# Patient Record
Sex: Female | Born: 1940 | Race: White | Hispanic: No | Smoking: Former smoker
Health system: Southern US, Community
[De-identification: ages and names within clinical notes are randomized; demographics above are authoritative.]

## PROBLEM LIST (undated history)

## (undated) DIAGNOSIS — G20 Parkinson's disease: Secondary | ICD-10-CM

## (undated) DIAGNOSIS — G4700 Insomnia, unspecified: Secondary | ICD-10-CM

## (undated) DIAGNOSIS — I4891 Unspecified atrial fibrillation: Secondary | ICD-10-CM

## (undated) DIAGNOSIS — I509 Heart failure, unspecified: Secondary | ICD-10-CM

## (undated) DIAGNOSIS — F329 Major depressive disorder, single episode, unspecified: Secondary | ICD-10-CM

## (undated) DIAGNOSIS — J869 Pyothorax without fistula: Secondary | ICD-10-CM

## (undated) DIAGNOSIS — K219 Gastro-esophageal reflux disease without esophagitis: Secondary | ICD-10-CM

## (undated) DIAGNOSIS — D649 Anemia, unspecified: Secondary | ICD-10-CM

## (undated) DIAGNOSIS — G20A1 Parkinson's disease without dyskinesia, without mention of fluctuations: Secondary | ICD-10-CM

## (undated) DIAGNOSIS — M1990 Unspecified osteoarthritis, unspecified site: Secondary | ICD-10-CM

## (undated) DIAGNOSIS — I10 Essential (primary) hypertension: Secondary | ICD-10-CM

## (undated) HISTORY — PX: PEG TUBE PLACEMENT: SUR1034

---

## 2015-03-22 ENCOUNTER — Ambulatory Visit
Admission: EM | Admit: 2015-03-22 | Discharge: 2015-03-22 | Disposition: A | Discharge status: Home / Self Care | Payer: Medicare PPO | Attending: Family Medicine | Admitting: Family Medicine

## 2015-03-22 DIAGNOSIS — R3915 Urgency of urination: Secondary | ICD-10-CM

## 2015-03-22 DIAGNOSIS — R350 Frequency of micturition: Secondary | ICD-10-CM

## 2015-03-22 DIAGNOSIS — I10 Essential (primary) hypertension: Secondary | ICD-10-CM | POA: Insufficient documentation

## 2015-03-22 HISTORY — DX: Essential (primary) hypertension: I10

## 2015-03-22 LAB — URINALYSIS COMPLETE WITH MICROSCOPIC (ARMC ONLY)
GLUCOSE, UA: NEGATIVE mg/dL
HGB URINE DIPSTICK: NEGATIVE
NITRITE: NEGATIVE
Protein, ur: 100 mg/dL — AB
RBC / HPF: NONE SEEN RBC/hpf (ref <3)
Specific Gravity, Urine: 1.025 (ref 1.005–1.030)
pH: 5.5 (ref 5.0–8.0)

## 2015-03-22 MED ORDER — CIPROFLOXACIN HCL 250 MG PO TABS: 250.0000 mg | ORAL_TABLET | Freq: Two times a day (BID) | ORAL | Status: DC

## 2015-03-22 NOTE — ED Provider Notes (Signed)
CSN: [id number]     Arrival date & time 03/22/15  1330 History   First MD Initiated Contact with Patient 03/22/15 1606     Chief Complaint  Patient presents with  . Urinary Frequency   (Consider location/radiation/quality/duration/timing/severity/associated sxs/prior Treatment) Patient is a 74 y.o. female presenting with frequency.  Urinary Frequency This is a new problem. The current episode started more than 2 days ago. The problem occurs constantly. The problem has not changed since onset.Pertinent negatives include no abdominal pain. Associated symptoms comments: Denies fevers, chills, vomiting, or back pain. Nothing relieves the symptoms. She has tried nothing for the symptoms.    Past Medical History  Diagnosis Date  . Hypertension    History reviewed. No pertinent past surgical history. No family history on file. History  Substance Use Topics  . Smoking status: Never Smoker   . Smokeless tobacco: Not on file  . Alcohol Use: No   OB History    No data available     Review of Systems  Gastrointestinal: Negative for abdominal pain.  Genitourinary: Positive for frequency.    Allergies  Sulfa antibiotics  Home Medications   Prior to Admission medications   Medication Sig Start Date End Date Taking? Authorizing Provider  amLODipine (NORVASC) 10 MG tablet Take 10 mg by mouth daily.   Yes Historical Provider, MD  aspirin 81 MG tablet Take 81 mg by mouth daily.   Yes Historical Provider, MD  Multiple Vitamin (MULTIVITAMIN) capsule Take 1 capsule by mouth daily.   Yes Historical Provider, MD  nebivolol (BYSTOLIC) 2.5 MG tablet Take 2.5 mg by mouth daily.   Yes Historical Provider, MD  PARoxetine (PAXIL) 20 MG tablet Take 20 mg by mouth daily.   Yes Historical Provider, MD  triamterene-hydrochlorothiazide (DYAZIDE) 37.5-25 MG per capsule Take 1 capsule by mouth daily.   Yes Historical Provider, MD  ciprofloxacin (CIPRO) 250 MG tablet Take 1 tablet (250 mg total) by mouth  every 12 (twelve) hours. 03/22/15   Payton Mccallumrlando Kyliegh Jester, MD   BP 170/82 mmHg  Pulse 84  Temp(Src) 97.5 F (36.4 C) (Tympanic)  Resp 18  Ht 5\' 2"  (1.575 m)  Wt 260 lb (117.935 kg)  BMI 47.54 kg/m2  SpO2 99%  LMP  Physical Exam  Constitutional: She appears well-developed and well-nourished. No distress.  Pulmonary/Chest: Effort normal. No respiratory distress.  Abdominal: Soft. She exhibits no distension. There is no tenderness. There is no rebound and no guarding.  Skin: She is not diaphoretic.  Nursing note and vitals reviewed.   ED Course  Procedures (including critical care time) Labs Review Labs Reviewed  URINALYSIS COMPLETEWITH MICROSCOPIC ([facility] ONLY) - Abnormal; Notable for the following:    Bilirubin Urine 1+ (*)    Ketones, ur TRACE (*)    Protein, ur 100 (*)    Leukocytes, UA TRACE (*)    Squamous Epithelial / LPF 6-30 (*)    All other components within normal limits  URINE CULTURE    Imaging Review No results found.   MDM   1. Urinary urgency   2. Urinary frequency    New Prescriptions   CIPROFLOXACIN (CIPRO) 250 MG TABLET    Take 1 tablet (250 mg total) by mouth every 12 (twelve) hours.   Plan: 1. Test results and diagnosis reviewed with patient 2. rx as per orders; risks, benefits, potential side effects reviewed with patient 3. Recommend supportive treatment with increased fluids 4. F/u prn if symptoms worsen or don't improve   [employer]rlando Kaydence Menard,  MD 03/22/15 1610

## 2015-03-22 NOTE — ED Notes (Signed)
Pt states "I have urinary pain x 4-5 days. More than pain I feel I can't empty my bladder."

## 2015-03-24 LAB — URINE CULTURE

## 2015-11-07 ENCOUNTER — Other Ambulatory Visit: Payer: Self-pay | Admitting: Oncology

## 2016-07-06 DIAGNOSIS — M62449 Contracture of muscle, unspecified hand: Secondary | ICD-10-CM | POA: Insufficient documentation

## 2016-09-28 ENCOUNTER — Inpatient Hospital Stay
Admission: EM | Admit: 2016-09-28 | Discharge: 2016-10-02 | DRG: 871 | Disposition: A | Discharge status: Skilled Nursing Facility | Payer: Medicare Other | Attending: Internal Medicine | Admitting: Internal Medicine

## 2016-09-28 ENCOUNTER — Encounter: Payer: Self-pay | Admitting: Intensive Care

## 2016-09-28 ENCOUNTER — Emergency Department: Payer: Medicare Other

## 2016-09-28 DIAGNOSIS — Z931 Gastrostomy status: Secondary | ICD-10-CM | POA: Diagnosis not present

## 2016-09-28 DIAGNOSIS — J449 Chronic obstructive pulmonary disease, unspecified: Secondary | ICD-10-CM | POA: Diagnosis present

## 2016-09-28 DIAGNOSIS — I4891 Unspecified atrial fibrillation: Secondary | ICD-10-CM | POA: Diagnosis present

## 2016-09-28 DIAGNOSIS — A0472 Enterocolitis due to Clostridium difficile, not specified as recurrent: Secondary | ICD-10-CM | POA: Diagnosis present

## 2016-09-28 DIAGNOSIS — R197 Diarrhea, unspecified: Secondary | ICD-10-CM

## 2016-09-28 DIAGNOSIS — Z87891 Personal history of nicotine dependence: Secondary | ICD-10-CM

## 2016-09-28 DIAGNOSIS — Z7901 Long term (current) use of anticoagulants: Secondary | ICD-10-CM | POA: Diagnosis not present

## 2016-09-28 DIAGNOSIS — R6521 Severe sepsis with septic shock: Secondary | ICD-10-CM | POA: Diagnosis present

## 2016-09-28 DIAGNOSIS — Z7189 Other specified counseling: Secondary | ICD-10-CM | POA: Diagnosis not present

## 2016-09-28 DIAGNOSIS — E43 Unspecified severe protein-calorie malnutrition: Secondary | ICD-10-CM | POA: Diagnosis present

## 2016-09-28 DIAGNOSIS — I10 Essential (primary) hypertension: Secondary | ICD-10-CM | POA: Diagnosis present

## 2016-09-28 DIAGNOSIS — Z66 Do not resuscitate: Secondary | ICD-10-CM | POA: Diagnosis present

## 2016-09-28 DIAGNOSIS — R0902 Hypoxemia: Secondary | ICD-10-CM | POA: Diagnosis not present

## 2016-09-28 DIAGNOSIS — J9601 Acute respiratory failure with hypoxia: Secondary | ICD-10-CM | POA: Diagnosis present

## 2016-09-28 DIAGNOSIS — R32 Unspecified urinary incontinence: Secondary | ICD-10-CM | POA: Diagnosis present

## 2016-09-28 DIAGNOSIS — H9190 Unspecified hearing loss, unspecified ear: Secondary | ICD-10-CM | POA: Diagnosis present

## 2016-09-28 DIAGNOSIS — A419 Sepsis, unspecified organism: Principal | ICD-10-CM

## 2016-09-28 DIAGNOSIS — N170 Acute kidney failure with tubular necrosis: Secondary | ICD-10-CM | POA: Diagnosis present

## 2016-09-28 DIAGNOSIS — Z791 Long term (current) use of non-steroidal anti-inflammatories (NSAID): Secondary | ICD-10-CM | POA: Diagnosis not present

## 2016-09-28 DIAGNOSIS — E876 Hypokalemia: Secondary | ICD-10-CM | POA: Diagnosis present

## 2016-09-28 DIAGNOSIS — J869 Pyothorax without fistula: Secondary | ICD-10-CM | POA: Diagnosis present

## 2016-09-28 DIAGNOSIS — E871 Hypo-osmolality and hyponatremia: Secondary | ICD-10-CM | POA: Diagnosis present

## 2016-09-28 DIAGNOSIS — Z79899 Other long term (current) drug therapy: Secondary | ICD-10-CM

## 2016-09-28 DIAGNOSIS — L8990 Pressure ulcer of unspecified site, unspecified stage: Secondary | ICD-10-CM | POA: Insufficient documentation

## 2016-09-28 DIAGNOSIS — Z882 Allergy status to sulfonamides status: Secondary | ICD-10-CM

## 2016-09-28 DIAGNOSIS — I959 Hypotension, unspecified: Secondary | ICD-10-CM

## 2016-09-28 DIAGNOSIS — Z6839 Body mass index (BMI) 39.0-39.9, adult: Secondary | ICD-10-CM | POA: Diagnosis not present

## 2016-09-28 DIAGNOSIS — Z515 Encounter for palliative care: Secondary | ICD-10-CM

## 2016-09-28 HISTORY — DX: Unspecified atrial fibrillation: I48.91

## 2016-09-28 HISTORY — DX: Pyothorax without fistula: J86.9

## 2016-09-28 LAB — GASTROINTESTINAL PANEL BY PCR, STOOL (REPLACES STOOL CULTURE)

## 2016-09-28 LAB — CBC WITH DIFFERENTIAL/PLATELET
Basophils Absolute: 0 10*3/uL (ref 0–0.1)
Basophils Relative: 0 %
Eosinophils Absolute: 0.6 10*3/uL (ref 0–0.7)
Eosinophils Relative: 1 %
HEMATOCRIT: 31.1 % — AB (ref 35.0–47.0)
Hemoglobin: 9.8 g/dL — ABNORMAL LOW (ref 12.0–16.0)
Lymphocytes Relative: 1 %
Lymphs Abs: 0.6 10*3/uL — ABNORMAL LOW (ref 1.0–3.6)
MCH: 23.3 pg — ABNORMAL LOW (ref 26.0–34.0)
MCHC: 31.5 g/dL — ABNORMAL LOW (ref 32.0–36.0)
MCV: 73.9 fL — AB (ref 80.0–100.0)
MONOS PCT: 1 %
Monocytes Absolute: 0.6 10*3/uL (ref 0.2–0.9)
NEUTROS PCT: 97 %
Neutro Abs: 53.3 10*3/uL — ABNORMAL HIGH (ref 1.4–6.5)
PLATELETS: 358 10*3/uL (ref 150–440)
RBC: 4.21 MIL/uL (ref 3.80–5.20)
RDW: 21.1 % — ABNORMAL HIGH (ref 11.5–14.5)
WBC: 55.1 10*3/uL (ref 3.6–11.0)

## 2016-09-28 LAB — COMPREHENSIVE METABOLIC PANEL
ALT: 10 U/L — AB (ref 14–54)
ANION GAP: 11 (ref 5–15)
AST: 54 U/L — ABNORMAL HIGH (ref 15–41)
Albumin: 2.3 g/dL — ABNORMAL LOW (ref 3.5–5.0)
Alkaline Phosphatase: 137 U/L — ABNORMAL HIGH (ref 38–126)
BUN: 16 mg/dL (ref 6–20)
CHLORIDE: 95 mmol/L — AB (ref 101–111)
CO2: 25 mmol/L (ref 22–32)
Calcium: 8.5 mg/dL — ABNORMAL LOW (ref 8.9–10.3)
Creatinine, Ser: 1.95 mg/dL — ABNORMAL HIGH (ref 0.44–1.00)
GFR calc non Af Amer: 24 mL/min — ABNORMAL LOW (ref >60)
GFR, EST AFRICAN AMERICAN: 28 mL/min — AB (ref >60)
Glucose, Bld: 113 mg/dL — ABNORMAL HIGH (ref 65–99)
Potassium: 3.6 mmol/L (ref 3.5–5.1)
SODIUM: 131 mmol/L — AB (ref 135–145)
Total Bilirubin: 1.2 mg/dL (ref 0.3–1.2)
Total Protein: 6.5 g/dL (ref 6.5–8.1)

## 2016-09-28 LAB — BLOOD GAS, VENOUS
Acid-Base Excess: 0.8 mmol/L (ref 0.0–2.0)
BICARBONATE: 27.7 mmol/L (ref 20.0–28.0)
PATIENT TEMPERATURE: 37
pCO2, Ven: 55 mmHg (ref 44.0–60.0)
pH, Ven: 7.31 (ref 7.250–7.430)

## 2016-09-28 LAB — PROTIME-INR
INR: 1.71
PROTHROMBIN TIME: 20.3 s — AB (ref 11.4–15.2)

## 2016-09-28 LAB — C DIFFICILE QUICK SCREEN W PCR REFLEX
C Diff antigen: POSITIVE — AB
C Diff toxin: NEGATIVE

## 2016-09-28 LAB — TROPONIN I: Troponin I: <0.03 ng/mL (ref <0.03)

## 2016-09-28 LAB — CLOSTRIDIUM DIFFICILE BY PCR: Toxigenic C. Difficile by PCR: POSITIVE — AB

## 2016-09-28 LAB — LACTIC ACID, PLASMA
LACTIC ACID, VENOUS: 1.7 mmol/L (ref 0.5–1.9)
Lactic Acid, Venous: 1.9 mmol/L (ref 0.5–1.9)

## 2016-09-28 LAB — MRSA PCR SCREENING: MRSA BY PCR: NEGATIVE

## 2016-09-28 MED ORDER — SALINE SPRAY 0.65 % NA SOLN
1.0000 | NASAL | Status: DC | PRN
  Filled 2016-09-28: qty 44

## 2016-09-28 MED ORDER — SODIUM CHLORIDE 0.9 % IV SOLN
Freq: Once | INTRAVENOUS | Status: AC
  Administered 2016-09-28: 11:00:00 via INTRAVENOUS

## 2016-09-28 MED ORDER — VANCOMYCIN HCL IN DEXTROSE 1-5 GM/200ML-% IV SOLN
1000.0000 mg | Freq: Once | INTRAVENOUS | Status: AC
  Administered 2016-09-28: 1000 mg via INTRAVENOUS
  Filled 2016-09-28: qty 200

## 2016-09-28 MED ORDER — SODIUM CHLORIDE 0.9 % IV SOLN
Freq: Once | INTRAVENOUS | Status: AC
  Administered 2016-09-28: 14:00:00 via INTRAVENOUS

## 2016-09-28 MED ORDER — IPRATROPIUM-ALBUTEROL 0.5-2.5 (3) MG/3ML IN SOLN: 3.0000 mL | Freq: Four times a day (QID) | RESPIRATORY_TRACT | Status: DC | PRN

## 2016-09-28 MED ORDER — METRONIDAZOLE 50 MG/ML ORAL SUSPENSION: 250.0000 mg | Freq: Four times a day (QID) | ORAL | Status: DC

## 2016-09-28 MED ORDER — METRONIDAZOLE 500 MG PO TABS
500.0000 mg | ORAL_TABLET | Freq: Once | ORAL | Status: AC
  Administered 2016-09-28: 500 mg via ORAL
  Filled 2016-09-28: qty 1

## 2016-09-28 MED ORDER — SODIUM CHLORIDE 0.9 % IV SOLN: INTRAVENOUS | Status: DC

## 2016-09-28 MED ORDER — PIPERACILLIN-TAZOBACTAM 3.375 G IVPB: 3.3750 g | Freq: Three times a day (TID) | INTRAVENOUS | Status: DC

## 2016-09-28 MED ORDER — CARBIDOPA-LEVODOPA 10-100 MG PO TABS
1.0000 | ORAL_TABLET | Freq: Three times a day (TID) | ORAL | Status: DC
  Filled 2016-09-28 (×2): qty 1

## 2016-09-28 MED ORDER — PIPERACILLIN-TAZOBACTAM 3.375 G IVPB
3.3750 g | Freq: Once | INTRAVENOUS | Status: AC
  Administered 2016-09-28: 3.375 g via INTRAVENOUS
  Filled 2016-09-28: qty 50

## 2016-09-28 MED ORDER — SODIUM CHLORIDE 0.9% FLUSH: 3.0000 mL | Freq: Two times a day (BID) | INTRAVENOUS | Status: DC

## 2016-09-28 MED ORDER — HEPARIN SODIUM (PORCINE) 5000 UNIT/ML IJ SOLN: 5000.0000 [IU] | Freq: Three times a day (TID) | INTRAMUSCULAR | Status: DC

## 2016-09-28 MED ORDER — VANCOMYCIN HCL IN DEXTROSE 1-5 GM/200ML-% IV SOLN
1000.0000 mg | INTRAVENOUS | Status: DC
  Filled 2016-09-28: qty 200

## 2016-09-28 MED ORDER — METRONIDAZOLE 500 MG PO TABS: 250.0000 mg | ORAL_TABLET | Freq: Four times a day (QID) | ORAL | Status: DC

## 2016-09-28 MED ORDER — MORPHINE SULFATE (CONCENTRATE) 10 MG/0.5ML PO SOLN: 5.0000 mg | ORAL | Status: DC | PRN

## 2016-09-28 NOTE — Care Management (Signed)
The patient Mikayla [name](Mikayla [name]) has transitioned to 'comfort care' with family at bedside. I spoke with both of her daughters and although tearful and emotional as they watch their mother's health decline the are dealing with this situation as good as can be imagined. I share with them the services and inquired to see if they needed anything. Both of the daughters and the grandson are not in need of anything currently. I will be available and stay in touch with staff just in case something changes.

## 2016-09-28 NOTE — H&P (Signed)
[employer] [facility] - [place] at [facility]   PATIENT NAME: Mikayla [name]    MR#:  [id number]  DATE OF BIRTH:  08/21/1941  DATE OF ADMISSION:  09/28/2016  PRIMARY CARE PHYSICIAN: Pcp Not In System   REQUESTING/REFERRING PHYSICIAN: williams  CHIEF COMPLAINT:   Chief Complaint  Patient presents with  . Code Sepsis    HISTORY OF PRESENT ILLNESS: Mikayla [name]  is a 75 y.o. female with a known history of Empyema, hypertension, atrial fibrillation, she was [facility] from August [facility] until October 2017- for empyema. Her hospital stay was complicated and she had to stay on ventilator for 15 days with chest tubes for drainage and she was on broad-spectrum antibiotic and also finally had tach and PEG. Since October she is [facility] and she is there until now. She is given oral feeding and medications at nursing home and advised to have lost her PEG tube. Patient's history mainly obtained from was daughter who was present in the room. For last the 34 days at nursing home patient is having fever, they did x-ray on the chest 2 check but it was negative for any infection. Finally today to send her to emergency room. Sore daughter they also noticed some loose greenish colored stool. In emergency room patient was noted to be severely hypotensive and her white blood cell count 55,000. Patient is lethargic and not much responsive, she also have some greenish discharge around her PEG tube. As per daughter today, all other family members agreed about her critical condition and they would like to give a trial of antibiotic and IV fluid but no heroic measures and if she does not improve then they would like to consider hospice.  PAST MEDICAL HISTORY:   Past Medical History:  Diagnosis Date  . Atrial fibrillation (HCC)   . Empyema [facility](HCC)    in [facility] Aug 2017- stayed in hospital on for 2 months.  . Hypertension     PAST SURGICAL HISTORY: History  reviewed. No pertinent surgical history.  SOCIAL HISTORY:  Social History  Substance Use Topics  . Smoking status: Former Smoker    Types: Cigarettes  . Smokeless tobacco: Not on file     Comment: quit in 2001. smoked for 30 years  . Alcohol use No    FAMILY HISTORY:  Family History  Problem Relation Age of Onset  . Hypertension Mother     DRUG ALLERGIES:  Allergies  Allergen Reactions  . Sulfa Antibiotics Hives    REVIEW OF SYSTEMS:   Patient is lethargic and not able to give me review of system.  MEDICATIONS AT HOME:  Prior to Admission medications   Medication Sig Start Date End Date Taking? Authorizing Provider  acetaminophen (TYLENOL) 500 MG tablet Take 1,000 mg by mouth every 8 (eight) hours as needed for moderate pain or fever.   Yes Historical Provider, MD  amLODipine (NORVASC) 10 MG tablet Take 10 mg by mouth daily.   Yes Historical Provider, MD  carbidopa-levodopa (SINEMET IR) 10-100 MG tablet Take 1 tablet by mouth 3 (three) times daily.   Yes Historical Provider, MD  carvedilol (COREG) 12.5 MG tablet Take 12.5 mg by mouth 2 (two) times daily with a meal.   Yes Historical Provider, MD  celecoxib (CELEBREX) 100 MG capsule Take 100 mg by mouth 2 (two) times daily.   Yes Historical Provider, MD  furosemide (LASIX) 20 MG tablet Take 20 mg by mouth.   Yes Historical  Provider, MD  gabapentin (NEURONTIN) 300 MG capsule Take 300 mg by mouth 3 (three) times daily.   Yes Historical Provider, MD  ipratropium-albuterol (DUONEB) 0.5-2.5 (3) MG/3ML SOLN Take 3 mLs by nebulization 3 (three) times daily.   Yes Historical Provider, MD  Melatonin 5 MG TABS Take 5 mg by mouth at bedtime.   Yes Historical Provider, MD  omeprazole (PRILOSEC OTC) 20 MG tablet Take 20 mg by mouth 2 (two) times daily.   Yes Historical Provider, MD  PARoxetine (PAXIL) 30 MG tablet Take 30 mg by mouth daily.    Yes Historical Provider, MD  potassium chloride SA (K-DUR,KLOR-CON) 20 MEQ tablet Take 20 mEq by  mouth daily.   Yes Historical Provider, MD  sodium chloride (OCEAN) 0.65 % SOLN nasal spray Place 1 spray into both nostrils every 2 (two) hours as needed for congestion (allergies).   Yes Historical Provider, MD  traZODone (DESYREL) 50 MG tablet Take 25 mg by mouth at bedtime.   Yes Historical Provider, MD  warfarin (COUMADIN) 4 MG tablet Take 4 mg by mouth daily.   Yes Historical Provider, MD  ciprofloxacin (CIPRO) 250 MG tablet Take 1 tablet (250 mg total) by mouth every 12 (twelve) hours. Patient not taking: Reported on 09/28/2016 03/22/15   Payton Mccallumrlando Conty, MD      PHYSICAL EXAMINATION:   VITAL SIGNS: Blood pressure (!) 89/50, pulse 81, temperature 97.9 F (36.6 C), temperature source Oral, resp. rate (!) 23, height 5\' 4"  (1.626 m), weight 104.3 kg (230 lb), SpO2 99 %.  GENERAL:  75 y.o.-year-oldWith patient lying in the bed with no acute distress. Lethargic. In Trendelenburg position. EYES: Pupils equal, round, reactive to light and accommodation. No scleral icterus. Extraocular muscles intact.  HEENT: Head atraumatic, normocephalic. Oropharynx and nasopharynx clear.  NECK:  Supple, no jugular venous distention. No thyroid enlargement, no tenderness.  LUNGS: Normal breath sounds bilaterally, no wheezing, rales,rhonchi or crepitation. No use of accessory muscles of respiration.  CARDIOVASCULAR: S1, S2 normal. Tachycardia. No murmurs, rubs, or gallops.  ABDOMEN: Soft, nontender, distended. Bowel sounds present. No organomegaly or mass. PEG tube in place with greenish discharge around the tube insertion site. EXTREMITIES: No pedal edema, cyanosis, or clubbing.  NEUROLOGIC: Patient is lethargic, she is moaning to stimuli and moves upper limb to the response.  PSYCHIATRIC: The patient is lethargic, unable to assess.Marland Kitchen.  SKIN: No obvious rash, lesion, or ulcer.   LABORATORY PANEL:   CBC  Recent Labs Lab 09/28/16 1022  WBC 55.1*  HGB 9.8*  HCT 31.1*  PLT 358  MCV 73.9*  MCH 23.3*   MCHC 31.5*  RDW 21.1*  LYMPHSABS 0.6*  MONOABS 0.6  EOSABS 0.6  BASOSABS 0.0   ------------------------------------------------------------------------------------------------------------------  Chemistries   Recent Labs Lab 09/28/16 1022  NA 131*  K 3.6  CL 95*  CO2 25  GLUCOSE 113*  BUN 16  CREATININE 1.95*  CALCIUM 8.5*  AST 54*  ALT 10*  ALKPHOS 137*  BILITOT 1.2   ------------------------------------------------------------------------------------------------------------------ estimated creatinine clearance is 28.9 mL/min (by C-G formula based on SCr of 1.95 mg/dL (H)). ------------------------------------------------------------------------------------------------------------------ No results for input(s): TSH, T4TOTAL, T3FREE, THYROIDAB in the last 72 hours.  Invalid input(s): FREET3   Coagulation profile No results for input(s): INR, PROTIME in the last 168 hours. ------------------------------------------------------------------------------------------------------------------- No results for input(s): DDIMER in the last 72 hours. -------------------------------------------------------------------------------------------------------------------  Cardiac Enzymes  Recent Labs Lab 09/28/16 1022  TROPONINI <0.03   ------------------------------------------------------------------------------------------------------------------ Invalid input(s): POCBNP  ---------------------------------------------------------------------------------------------------------------  Urinalysis  Component Value Date/Time   COLORURINE YELLOW 03/22/2015 1530   APPEARANCEUR CLEAR 03/22/2015 1530   LABSPEC 1.025 03/22/2015 1530   PHURINE 5.5 03/22/2015 1530   GLUCOSEU NEGATIVE 03/22/2015 1530   HGBUR NEGATIVE 03/22/2015 1530   BILIRUBINUR 1+ (A) 03/22/2015 1530   KETONESUR TRACE (A) 03/22/2015 1530   PROTEINUR 100 (A) 03/22/2015 1530   NITRITE NEGATIVE 03/22/2015 1530    LEUKOCYTESUR TRACE (A) 03/22/2015 1530     RADIOLOGY: Dg Chest Port 1 View  Result Date: 09/28/2016 CLINICAL DATA:  Fever and diarrhea EXAM: PORTABLE CHEST 1 VIEW COMPARISON:  None FINDINGS: Normal heart size. Lung volumes are low. There is blunting of the right costophrenic angle which may reflect pleural effusion or pleural parenchymal scarring. Chronic appearing right fifth and sixth posterior rib deformities identified. IMPRESSION: 1. Blunting of right costophrenic angle which may reflect a right pleural effusion or pleural parenchymal scarring. 2. Chronic appearing right posterior rib fracture deformities Electronically Signed   By: Signa Kell M.D.   On: 09/28/2016 11:54    EKG: Orders placed or performed during the hospital encounter of 09/28/16  . ED EKG 12-Lead  . ED EKG 12-Lead  . EKG 12-Lead  . EKG 12-Lead    IMPRESSION AND PLAN:  * Septic shock   Colitis, diarrhea   Positive for C. difficile antigen but negative for toxins.   She is having fever , septic shock and diarrhea.   After I discussed with family, they agreed to give a trial of IV antibiotic and IV fluid boluses for today and if she does not respond they would like to consider hospice tomorrow.    In any adverse event patient would be DO NOT RESUSCITATE.   We will give IV vancomycin, Zosyn, oral Flagyl.   IV fluid normal saline.  * Atrial fibrillation   Currently tachycardic secondary to septic shock.   Continue monitoring with IV fluid.   Hold Coumadin and check INR level.  * Acute renal failure   Secondary to septic shock.   Creatinine was normal 2 months ago at [facility].  * Severe malnutrition   Indicated by low albumin level.   Another indicator of extremely poor prognosis.  All the records are reviewed and case discussed with ED provider. Management plans discussed with the patient, family and they are in agreement.  CODE STATUS: DO NOT RESUSCITATE Code Status History    This patient does not  have a recorded code status. Please follow your organizational policy for patients in this situation.    Advance Directive Documentation   Flowsheet Row Most Recent Value  Type of Advance Directive  Healthcare Power of Attorney, Out of facility DNR (pink MOST or yellow form)  Pre-existing out of facility DNR order (yellow form or pink MOST form)  No data  "MOST" Form in Place?  No data       TOTAL TIME TAKING CARE OF THIS PATIENT: 50 minutes.  I discussed with ER physician and patient's daughter in the room.  Altamese Dilling M.D on 09/28/2016   Between 7am to 6pm - Pager - [phone]  After 6pm go to [email or web address] - password [email or web address]  [facility] [place] [facility]  Office  [phone]  CC: Primary care physician; Pcp Not In System   Note: This dictation was prepared with Dragon dictation along with smaller phrase technology. Any transcriptional errors that result from this process are unintentional.

## 2016-09-28 NOTE — ED Notes (Signed)
Patient cleaned of a greenish, mucous small amount of stool. Patient tolerated procedure well.

## 2016-09-28 NOTE — ED Triage Notes (Signed)
PAtient [facility]. Staff went to take vitals and found O2 in the 80s on RA and 101 temp. EMS vitals 80s O2 RA, 60/40 B/p, 101ax. Patient received 300cc with EMS. L arm is baseline contracted. Patient is Alert & Oriented X4.

## 2016-09-28 NOTE — ED Notes (Signed)
Patient took off [place]. Sats dropped to 80%. [place] replaced and sats rose to the high 90's.

## 2016-09-28 NOTE — ED Notes (Signed)
Dressing changed around G-tube.

## 2016-09-28 NOTE — Progress Notes (Signed)
aS PER NURSE, bp IS STILL RUNING LOW AFTER MULTIPLE iv boluses.  She spoke to family- and they agreed on comfort care and stop all other treatment. Orders entered.

## 2016-09-28 NOTE — ED Provider Notes (Signed)
[facility] Emergency Department Provider Note    Level V caveat: Review of systems and history is limited by dementia    Time seen: ----------------------------------------- 10:23 AM on 09/28/2016 -----------------------------------------    I have reviewed the triage vital signs and the nursing notes.   HISTORY  Chief Complaint No chief complaint on file.    HPI Mikayla [name] is a 75 y.o. female who presents ER being brought by EMS forlow oxygen on room air with 101 temperature. She was found to be hypotensive and somewhat altered although the Patient is denying any specific complaint. Patient is a DO NOT RESUSCITATE. Patient received 300 cc bolus in route by EMS.   Past Medical History:  Diagnosis Date  . Hypertension     There are no active problems to display for this patient.   No past surgical history on file.  Allergies Sulfa antibiotics  Social History Social History  Substance Use Topics  . Smoking status: Never Smoker  . Smokeless tobacco: Not on file  . Alcohol use No    Review of Systems Unknown at this time  ____________________________________________   PHYSICAL EXAM:  VITAL SIGNS: ED Triage Vitals  Enc Vitals Group     BP      Pulse      Resp      Temp      Temp src      SpO2      Weight      Height      Head Circumference      Peak Flow      Pain Score      Pain Loc      Pain Edu?      Excl. in GC?     Constitutional: Lethargic, mild distress, chronically ill-appearing Eyes: Conjunctivae are normal. PERRL. Normal extraocular movements. ENT   Head: Normocephalic and atraumatic.   Nose: No congestion/rhinnorhea.   Mouth/Throat: Mucous membranes are moist.   Neck: No stridor. Cardiovascular: Normal rate, regular rhythm. No murmurs, rubs, or gallops. Respiratory: Normal respiratory effort without tachypnea nor retractions. Scattered rhonchi Gastrointestinal: Soft and nontender.  Normal bowel sounds Musculoskeletal: Nontender with normal range of motion in all extremities. 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 Pallor is noted Psychiatric: Mood and affect are normal. Speech and behavior are normal.  ____________________________________________  EKG: Interpreted by me. Sinus rhythm rate of 89 bpm, normal PR interval, normal QRS, normal QT, normal axis, low voltage  ____________________________________________  ED COURSE:  Pertinent labs & imaging results that were available during my care of the patient were reviewed by me and considered in my medical decision making (see chart for details). Clinical Course   Patient presents to the ER likely septic. We will assess with labs and imaging.  Procedures ____________________________________________   LABS (pertinent positives/negatives)  Labs Reviewed  C DIFFICILE QUICK SCREEN W PCR REFLEX - Abnormal; Notable for the following:       Result Value   C Diff antigen POSITIVE (*)    All other components within normal limits  COMPREHENSIVE METABOLIC PANEL - Abnormal; Notable for the following:    Sodium 131 (*)    Chloride 95 (*)    Glucose, Bld 113 (*)    Creatinine, Ser 1.95 (*)    Calcium 8.5 (*)    Albumin 2.3 (*)    AST 54 (*)    ALT 10 (*)  Alkaline Phosphatase 137 (*)    GFR calc non Af Amer 24 (*)    GFR calc Af Amer 28 (*)    All other components within normal limits  CBC WITH DIFFERENTIAL/PLATELET - Abnormal; Notable for the following:    WBC 55.1 (*)    Hemoglobin 9.8 (*)    HCT 31.1 (*)    MCV 73.9 (*)    MCH 23.3 (*)    MCHC 31.5 (*)    RDW 21.1 (*)    Neutro Abs 53.3 (*)    Lymphs Abs 0.6 (*)    All other components within normal limits  BLOOD GAS, VENOUS - Abnormal; Notable for the following:    pO2, Ven <31.0 (*)    All other components within normal limits  CULTURE, BLOOD  (ROUTINE X 2)  CULTURE, BLOOD (ROUTINE X 2)  URINE CULTURE  GASTROINTESTINAL PANEL BY PCR, STOOL (REPLACES STOOL CULTURE)  CLOSTRIDIUM DIFFICILE BY PCR  LACTIC ACID, PLASMA  TROPONIN I  LACTIC ACID, PLASMA  URINALYSIS COMPLETEWITH MICROSCOPIC ([facility] ONLY)   CRITICAL CARE Performed by: Emily FilbertWilliams, Cathi Hazan E   Total critical care time: 30 minutes  Critical care time was exclusive of separately billable procedures and treating other patients.  Critical care was necessary to treat or prevent imminent or life-threatening deterioration.  Critical care was time spent personally by me on the following activities: development of treatment plan with patient and/or surrogate as well as nursing, discussions with consultants, evaluation of patient's response to treatment, examination of patient, obtaining history from patient or surrogate, ordering and performing treatments and interventions, ordering and review of laboratory studies, ordering and review of radiographic studies, pulse oximetry and re-evaluation of patient's condition.  RADIOLOGY Images were viewed by me  Chest x-ray IMPRESSION: 1. Blunting of right costophrenic angle which may reflect a right pleural effusion or pleural parenchymal scarring. 2. Chronic appearing right posterior rib fracture deformities ____________________________________________  FINAL ASSESSMENT AND PLAN  Sepsis, possible C. difficile colitis  Plan: Patient with labs and imaging as dictated above. Patient presented with hypoxia and hypotension. She is having copious diarrhea and noted to have marked leukocytosis. She was started on sepsis protocol but remains somewhat hypotensive. To this point the family states they did not want her intubated or to have central venous line or pressors at this point. I will discuss with the hospitalist for admission. She will remain on enteric precautions.   Emily FilbertWilliams, Olsen Mccutchan E, MD   Note: This dictation was  prepared with Dragon dictation. Any transcriptional errors that result from this process are unintentional    Emily FilbertJonathan E Anh Bigos, MD 09/28/16 1234

## 2016-09-28 NOTE — ED Notes (Signed)
Family is in the waiting room. Patient refuses in and out cath at this time. Dr. Mayford KnifeWilliams is aware.

## 2016-09-28 NOTE — Progress Notes (Signed)
Family Meeting Note  Advance Directive:yes  Today a meeting took place with the daughter and son in law.  Patient is unable to participate due WU:[id number]:Lacked capacity lethargic   The following clinical team members were present during this meeting:RN, MD  The following were discussed:Patient's diagnosis: , Patient's progosis: Hours - Days and Goals for treatment: DNR, pallaitive care  Additional follow-up to be provided: hospice  Time spent during discussion:20 minutes  Breon Rehm, Heath GoldVAIBHAVKUMAR, MD

## 2016-09-28 NOTE — ED Notes (Signed)
Patient had leakage of greenish bile surrounding G-Tube. MD aware.

## 2016-09-28 NOTE — Progress Notes (Addendum)
Pharmacy Antibiotic Note  Mikayla JohnsCatherine R Winokur is a 75 y.o. female admitted on 09/28/2016 with sepsis.  Pharmacy has been consulted for Vancomycin/Zosyn dosing.  Plan: Vancomycin 1g IV every 24 hours.  Goal trough 15-20 mcg/mL. 1st dose given in ED 12/4 at 11:25.  2nd dose ordered ~6.5 hours later at 18:00 for stacked dosing.  Trough level ordered prior to 5th dose on 12/7 at 17:30.  Zosyn 3.375g IV q8h (4 hour infusion).  Height: 5\' 4"  (162.6 cm) Weight: 230 lb (104.3 kg) IBW/kg (Calculated) : 54.7  Temp (24hrs), Avg:97.9 F (36.6 C), Min:97.9 F (36.6 C), Max:97.9 F (36.6 C)   Recent Labs Lab 09/28/16 1022  WBC 55.1*  CREATININE 1.95*  LATICACIDVEN 1.9    Estimated Creatinine Clearance: 28.9 mL/min (by C-G formula based on SCr of 1.95 mg/dL (H)).    Allergies  Allergen Reactions  . Sulfa Antibiotics Hives    Antimicrobials this admission: Vancomycin 12/4 >>  Zosyn 12/4 >>  Metronidazole 12/4 >>  Dose adjustments this admission: N/A  Microbiology results: 12/4 BCx: pending 12/4 UCx: pending  12/4 MRSA PCR: pending 12/4 C.Diff by PCR: Positive  Thank you for allowing pharmacy to be a part of this patient's care.  Bobbijo Holst K,RPh Clinical Pharmacist 09/28/2016 4:08 PM

## 2016-09-29 DIAGNOSIS — A419 Sepsis, unspecified organism: Principal | ICD-10-CM

## 2016-09-29 DIAGNOSIS — Z515 Encounter for palliative care: Secondary | ICD-10-CM

## 2016-09-29 DIAGNOSIS — Z7189 Other specified counseling: Secondary | ICD-10-CM

## 2016-09-29 DIAGNOSIS — R6521 Severe sepsis with septic shock: Secondary | ICD-10-CM

## 2016-09-29 LAB — PROTIME-INR
INR: 1.57
Prothrombin Time: 18.9 seconds — ABNORMAL HIGH (ref 11.4–15.2)

## 2016-09-29 MED ORDER — SODIUM CHLORIDE 0.9 % IV BOLUS (SEPSIS)
500.0000 mL | Freq: Once | INTRAVENOUS | Status: AC
  Administered 2016-09-29: 14:00:00 500 mL via INTRAVENOUS

## 2016-09-29 MED ORDER — CIPROFLOXACIN IN D5W 400 MG/200ML IV SOLN
400.0000 mg | INTRAVENOUS | Status: DC
  Administered 2016-09-29: 400 mg via INTRAVENOUS
  Filled 2016-09-29 (×2): qty 200

## 2016-09-29 MED ORDER — SODIUM CHLORIDE 0.9 % IV SOLN
INTRAVENOUS | Status: AC
  Administered 2016-09-29 – 2016-09-30 (×2): via INTRAVENOUS

## 2016-09-29 MED ORDER — PANTOPRAZOLE SODIUM 40 MG PO TBEC
40.0000 mg | DELAYED_RELEASE_TABLET | Freq: Two times a day (BID) | ORAL | Status: DC
  Administered 2016-09-29 – 2016-10-01 (×4): 40 mg via ORAL
  Filled 2016-09-29 (×4): qty 1

## 2016-09-29 MED ORDER — METRONIDAZOLE 50 MG/ML ORAL SUSPENSION
500.0000 mg | Freq: Three times a day (TID) | ORAL | Status: DC
  Administered 2016-09-29 – 2016-10-01 (×8): 500 mg via ORAL
  Filled 2016-09-29 (×12): qty 10

## 2016-09-29 MED ORDER — ONDANSETRON HCL 4 MG/2ML IJ SOLN: 4.0000 mg | Freq: Four times a day (QID) | INTRAMUSCULAR | Status: DC | PRN

## 2016-09-29 MED ORDER — WARFARIN SODIUM 4 MG PO TABS
4.0000 mg | ORAL_TABLET | Freq: Once | ORAL | Status: AC
  Administered 2016-09-29: 4 mg via ORAL
  Filled 2016-09-29: qty 1

## 2016-09-29 MED ORDER — TRAZODONE HCL 50 MG PO TABS
25.0000 mg | ORAL_TABLET | Freq: Every day | ORAL | Status: DC
  Administered 2016-09-29 – 2016-09-30 (×2): 25 mg via ORAL
  Filled 2016-09-29 (×3): qty 1

## 2016-09-29 MED ORDER — WARFARIN - PHARMACIST DOSING INPATIENT
Freq: Every day | Status: DC
  Administered 2016-09-29 – 2016-10-01 (×3)

## 2016-09-29 MED ORDER — ACETAMINOPHEN 325 MG PO TABS
650.0000 mg | ORAL_TABLET | Freq: Four times a day (QID) | ORAL | Status: DC | PRN
Start: 2016-09-29 — End: 2016-10-02
  Administered 2016-09-30: 650 mg via ORAL
  Filled 2016-09-29: qty 2

## 2016-09-29 MED ORDER — PAROXETINE HCL 10 MG PO TABS
30.0000 mg | ORAL_TABLET | Freq: Every day | ORAL | Status: DC
  Administered 2016-09-29 – 2016-10-02 (×4): 30 mg via ORAL
  Filled 2016-09-29 (×4): qty 3

## 2016-09-29 MED ORDER — ORAL CARE MOUTH RINSE
15.0000 mL | Freq: Two times a day (BID) | OROMUCOSAL | Status: DC
  Administered 2016-09-29 – 2016-10-02 (×6): 15 mL via OROMUCOSAL

## 2016-09-29 NOTE — Progress Notes (Addendum)
   [facility] [place] at [facility]   PATIENT NAME: Mikayla LenteCatherine Todt    MR#:  [id number]  DATE OF BIRTH:  02/18/1941  SUBJECTIVE:  CHIEF COMPLAINT:   Chief Complaint  Patient presents with  . Code Sepsis   Pt is more awake, but confused. REVIEW OF SYSTEMS:  Review of Systems  Unable to perform ROS: Acuity of condition    DRUG ALLERGIES:   Allergies  Allergen Reactions  . Sulfa Antibiotics Hives   VITALS:  Blood pressure 96/71, pulse 100, temperature 97.8 F (36.6 C), temperature source Oral, resp. rate (!) 22, height 5\' 4"  (1.626 m), weight 227 lb 14.4 oz (103.4 kg), SpO2 99 %. PHYSICAL EXAMINATION:  Physical Exam  Constitutional: She is well-developed, well-nourished, and in no distress.  HENT:  Head: Normocephalic.  Dry oral mucosa.  Eyes: Conjunctivae and EOM are normal. No scleral icterus.  Neck: Normal range of motion. Neck supple. No JVD present. No tracheal deviation present.  Cardiovascular: Exam reveals no gallop.   No murmur heard. tachycardia  Pulmonary/Chest: No respiratory distress. She has no wheezes. She has no rales.  Abdominal: Soft. Bowel sounds are normal. She exhibits no distension. There is tenderness. There is no rebound and no guarding.  Musculoskeletal: She exhibits no edema or tenderness.  Neurological:  Confused, AAOX2  Skin: No rash noted. No erythema.   LABORATORY PANEL:   CBC  Recent Labs Lab 09/28/16 1022  WBC 55.1*  HGB 9.8*  HCT 31.1*  PLT 358   ------------------------------------------------------------------------------------------------------------------ Chemistries   Recent Labs Lab 09/28/16 1022  NA 131*  K 3.6  CL 95*  CO2 25  GLUCOSE 113*  BUN 16  CREATININE 1.95*  CALCIUM 8.5*  AST 54*  ALT 10*  ALKPHOS 137*  BILITOT 1.2   RADIOLOGY:  No results found. ASSESSMENT AND PLAN:   * Septic shock, still hypotension. The patient was treated with antibiotics and IV fluid bolus in the  ED yesterday. Family later decided palliative care and comfort care. But her daughters want treatment with abx and IVF, resume home medication. NS bolus, Start Cipro and flagyle, follow-up CBC and BMP.  Colitis colitis.  PCR positive for C. Difficile toxins.  Start Cipro and flagyle, follow-up CBC.  * Atrial fibrillation Still tachycardic secondary to septic shock. NS bolus and IV.  Resume Coumadin PTD.  * Acute renal failure and hyponatremia.   Secondary to septic shock.   Creatinine [facility] ago at [facility]. NS bolus and IV.  F/u BMP.  * Severe malnutrition Start diet, dietitian consult.   poor prognosis. F/u Palliative care team.  All the records are reviewed and case discussed with Care Management/Social Worker. Management plans discussed with the patient, daughters and they are in agreement.  CODE STATUS: DNR  TOTAL CRITICAL TIME TAKING CARE OF THIS PATIENT: 46 minutes.   More than 50% of the time was spent in counseling/coordination of care: YES  POSSIBLE D/C IN ? DAYS, DEPENDING ON CLINICAL CONDITION.   Shaune Pollackhen, Nicklos Gaxiola M.D on 09/29/2016 at 3:00 PM  Between 7am to 6pm - Pager - [phone]  After 6pm go to [email or web address] - Social research officer, governmentpassword [email or web address] [email or web address]  [facility] [place] [facility]  Office  [phone]  CC: Primary care physician; Pcp Not In System  Note: This dictation was prepared with Dragon dictation along with smaller phrase technology. Any transcriptional errors that result from this process are unintentional.

## 2016-09-29 NOTE — Progress Notes (Signed)
Clinical Child psychotherapistocial Worker (CSW) received consult that patient is from [facility]. Per Gavin Poundeborah admissions coordinator [facility] patient was admitted for short term rehab however she is now  transitioning to long term care and has applied for Medicaid. Medicaid is pending. Patient is on comfort care and palliative consult is pending. CSW will continue to follow and assist as needed.   [employer]Bailey Lum Stillinger, LCSW [phone]

## 2016-09-29 NOTE — Clinical Social Work Placement (Signed)
   CLINICAL SOCIAL WORK PLACEMENT  NOTE  Date:  09/29/2016  Patient Details  Name: Mikayla [name] MRN: [id number] Date of Birth: 01/06/1941  Clinical Social Work is seeking post-discharge placement for this patient at the Skilled  Nursing Facility level of care (*CSW will initial, date and re-position this form in  chart as items are completed):  Yes   Patient/family provided with [facility] Clinical Social Work Department's list of facilities offering this level of care within the geographic area requested by the patient (or if unable, by the patient's family).  Yes   Patient/family informed of their freedom to choose among providers that offer the needed level of care, that participate in Medicare, Medicaid or managed care program needed by the patient, have an available bed and are willing to accept the patient.  Yes   Patient/family informed of [facility]'s ownership interest in [facility] and [facility], as well as of the fact that they are under no obligation to receive care at these facilities.  PASRR submitted to EDS on       PASRR number received on       Existing PASRR number confirmed on 09/29/16     FL2 transmitted to all facilities in geographic area requested by pt/family on 09/29/16     FL2 transmitted to all facilities within larger geographic area on       Patient informed that his/her managed care company has contracts with or will negotiate with certain facilities, including the following:            Patient/family informed of bed offers received.  Patient chooses bed at       Physician recommends and patient chooses bed at      Patient to be transferred to   on  .  Patient to be transferred to facility by       Patient family notified on   of transfer.  Name of family member notified:        PHYSICIAN       Additional Comment:    _______________________________________________ [name], Darleen CrockerBailey M, LCSW 09/29/2016, 3:09 PM

## 2016-09-29 NOTE — Clinical Social Work Note (Signed)
Clinical Social Work Assessment  Patient Details  Name: Mikayla [name] MRN: [id number] Date of Birth: [birth year]  Date of referral:  09/29/16               Reason for consult:  Facility Placement                Permission sought to share information with:  Chartered certified accountant granted to share information::  Yes, Verbal Permission Granted  Name::      Mikayla [name]::   [place]   Relationship::     Contact Information:     Housing/Transportation Living arrangements for the past 2 months:  [place] of Information:  Patient, Adult Children Patient Interpreter Needed:  None Criminal Activity/Legal Involvement Pertinent to Current Situation/Hospitalization:  No - Comment as needed Significant Relationships:  Adult Children Lives with:  Facility Resident Do you feel safe going back to the place where you live?  Yes Need for family participation in patient care:  Yes (Comment)  Care giving concerns:  Patient has been at [facility] for the past 2 months.    Social Worker assessment / plan: Holiday representative ([place]) received consult that patient is from [name]. Per Mikayla [name] admissions coordinator at [facility] patient came in for short term rehab however she is transitioning to long term care. Per Mikayla [name] patient may be able to come back with hospice however the family will have to speak with Mikayla [name] in the business office to discuss the financial details. Per Mikayla [name] patient can return to [facility] when stable. CSW met with patient alone at bedside to discuss D/C plan. Patient was alert and oriented and answered questions appropriately. Per patient she is from [facility] but does not recall how long she has been there. Patient reported that it would be up to her daughters Mikayla [name] and Mikayla [name] if she goes back to [facility] or somewhere else. CSW contacted patient's daughter Mikayla [name]. Per daughter patient was was  [facility] recently and went through a very similar experience. Per daughter palliative care at [facility] said patient's prognosis is poor. Daughter reported that they are open to hospice however they are going to continue antibiotics and take it one day at a time. Daughter reported that they may want to go back to [facility] however they requested CSW send out referral to other facilities in [place]. Per daughter patient has been at [facility] for roughly 60 days and is in her co-pay days. CSW explained that patient cannot go to SNF under rehab and have hospice services. Daughter verbalized her understanding. Daughter is agreeable to SNF search for rehab at this time.   FL2 complete and faxed out. CSW will continue to follow and assist as needed.   Employment status:  Retired Nurse, adult PT Recommendations:  [place] / Referral to community resources:  [place]  Patient/Family's Response to care:  Per daughter they will take it one day at a time. They are open to hospice.    Patient/Family's Understanding of and Emotional Response to Diagnosis, Current Treatment, and Prognosis:  Patient's daughter Mikayla [name] is realistic and understands that patient may be approching the end of life. Daughter is open to hospice. Per RN case manager patient has improved from yesterday. Daughter was pleasant and thanked CSW for assistance.   Emotional Assessment Appearance:  Appears stated age Attitude/Demeanor/Rapport:    Affect (typically observed):  Accepting, Adaptable, Pleasant Orientation:  Oriented to Self, Oriented to Place, Oriented to  Time, Oriented to Situation Alcohol / Substance use:  Not Applicable Psych involvement (Current and /or in the community):  No (Comment)  Discharge Needs  Concerns to be addressed:  Discharge Planning Concerns Readmission within the last 30 days:  No Current discharge risk:  Dependent with  Mobility Barriers to Discharge:  Continued Medical Work up   [employer], Mikayla Beets, LCSW 09/29/2016, 2:54 PM

## 2016-09-29 NOTE — NC FL2 (Signed)
  [place] MEDICAID FL2 LEVEL OF CARE SCREENING TOOL     IDENTIFICATION  Patient Name: Mikayla [name] Birthdate: 02/08/1941 Sex: female Admission Date (Current Location): 09/28/2016  [place]   Facility and Address:  [facility], [address], [place], [place] [address]      Provider Number: [id number]  Attending Physician Name and Address:  Shaune PollackQing Adith Tejada, MD  Relative Name and Phone Number:       Current Level of Care: Hospital Recommended Level of Care: Skilled Nursing Facility Prior Approval Number:    Date Approved/Denied:   PASRR Number:  ( [id number] )  Discharge Plan: SNF    Current Diagnoses: Patient Active Problem List   Diagnosis Date Noted  . Septic shock (HCC) 09/28/2016  . Diarrhea 09/28/2016    Orientation RESPIRATION BLADDER Height & Weight     Self  O2 (4 Liters Oxygen ) Incontinent Weight: 227 lb 14.4 oz (103.4 kg) Height:  5\' 4"  (162.6 cm)  BEHAVIORAL SYMPTOMS/MOOD NEUROLOGICAL BOWEL NUTRITION STATUS   (none)  (none) Incontinent Diet (SLP to evaluate. )  AMBULATORY STATUS COMMUNICATION OF NEEDS Skin   Extensive Assist Verbally PU Stage and Appropriate Care, Other (Comment) (Stage 2 pressure injury to left posterior upper thigh. chest tube to right posterior lung )                       Personal Care Assistance Level of Assistance  Bathing, Feeding, Dressing Bathing Assistance: Limited assistance Feeding assistance: Limited assistance Dressing Assistance: Limited assistance     Functional Limitations Info  Sight, Hearing, Speech Sight Info: Adequate Hearing Info: Adequate Speech Info: Adequate    SPECIAL CARE FACTORS FREQUENCY                       Contractures      Additional Factors Info  Code Status, Allergies, Isolation Precautions Code Status Info:  (DNR) Allergies Info:  (Sulfa Antibiotics)     Isolation Precautions Info:  (Enteric precautions. )      Current Medications (09/29/2016):  This is the current hospital active medication list Current Facility-Administered Medications  Medication Dose Route Frequency Provider Last Rate Last Dose  . MEDLINE mouth rinse  15 mL Mouth Rinse BID Shaune PollackQing Zafir Schauer, MD   15 mL at 09/29/16 1029     Discharge Medications: Please see discharge summary for a list of discharge medications.  Relevant Imaging Results:  Relevant Lab Results:   Additional Information  (SSN: [id number])  Sample, Darleen CrockerBailey M, LCSW

## 2016-09-29 NOTE — Progress Notes (Signed)
Pharmacy Antibiotic Note  Mikayla [name] is a 75 y.o. female admitted on 09/28/2016 with intra-abdominal infection.  Pharmacy has been consulted for ciprofloxacin dosing.  Plan: Ciprofloxacin 400 mg IV q24h  Height: 5\' 4"  (162.6 cm) Weight: 227 lb 14.4 oz (103.4 kg) IBW/kg (Calculated) : 54.7  Temp (24hrs), Avg:98.4 F (36.9 C), Min:97.8 F (36.6 C), Max:99.4 F (37.4 C)   Recent Labs Lab 09/28/16 1022 09/28/16 1715  WBC 55.1*  --   CREATININE 1.95*  --   LATICACIDVEN 1.9 1.7    Estimated Creatinine Clearance: 28.7 mL/min (by C-G formula based on SCr of 1.95 mg/dL (H)).    Allergies  Allergen Reactions  . Sulfa Antibiotics Hives   Thank you for allowing pharmacy to be a part of this patient's care.  Cindi CarbonMary M Arkin Imran, PharmD Clinical Pharmacist 09/29/2016 3:01 PM

## 2016-09-29 NOTE — Evaluation (Signed)
Clinical/Bedside Swallow Evaluation Patient Details  Name: Mikayla JohnsCatherine R [name] MRN: [id number] Date of Birth: 03/21/1941  Today's Date: 09/29/2016 Time: SLP Start Time (ACUTE ONLY): 1100 SLP Stop Time (ACUTE ONLY): 1200 SLP Time Calculation (min) (ACUTE ONLY): 60 min  Past Medical History:  Past Medical History:  Diagnosis Date  . Atrial fibrillation (HCC)   . Empyema [name](HCC)    in [name] Aug 2017- stayed in [name] on for 2 months.  . Hypertension    Past Surgical History: History reviewed. No pertinent surgical history. HPI:  Pt is a 75 y.o. female with a known history of Empyema, hypertension, atrial fibrillation, she was [facility] in [name] from August 2017 until October 2017- for empyema. Her [name] stay was complicated and she had to stay on ventilator for 15 days with chest tubes for drainage and she was on broad-spectrum antibiotic and also finally had trach and PEG. Since October she is discharged [name] and she is there until now. She is given an oral diet and medications at nursing [name] after passing swallowing tests w/ Speech Therapy there and at [name](per daughters). Patient's history at admission was mainly obtained from was daughter who was present in the room. Pt was severely hypotensive and her Mikayla blood cell count 55,000. Patient was lethargic and not very responsive. She also have some greenish discharge around her PEG tube. As per daughter today, all other family members agreed about her critical condition and they would like to give a trial of antibiotic and IV fluid but no heroic measures, and if she does not improve then they would like to consider Hospice. Currently today, pt is more awake/alert and verbally conversive. She is able to follow basic commands w/ SLP and Daughters(present). Pt would like to eat/drink. Of note, pt is easily SOB/winded w/ any exertion including talking.    Assessment / Plan / Recommendation Clinical Impression  Pt  appears at min increased risk for aspiration w/ oral intake secondary to overall weakness/fatigue d/t medical illness currently as well as d/t increased WOB/SOB w/ exertion during oral intake secondary to declined Pulmonary status. Pt exhibited intermittent, delayed throat clearing or cough w/ consecutive trials of thin liquids via straw(baseline use) so multiple trials limited by pinching the straw and allowing for rest breaks b/t trials. The noted throat clearing was noted by Daughters and was suggested that throat clearing has occured at SNF possibly after returning to an oral diet post ST services and swallow evaluations. Oral phase appeared grossly wfl for trials of puree; no solids were assessed at this time d/t the increased work effort of mastication w/ solids which could impact pt's safety of swallowing w/ po's. Pt assisted in holding cup w/ SLP and followed instructions given cues(needed). Pt would benefit from a modified diet of Puree w/ thin liquids - strict aspiration precautions; pills given in Puree - whole as able to tolerate. Recommend monitoring need for change of liquid consistency to Nectar consistency if increased s/s of aspiration noted w/ thin liquids. This was explained to Daughters present; education discussed on aspiration precautions and diet recommendations and support at meals given.      Aspiration Risk  Mild aspiration risk    Diet Recommendation  Dysphagia 1(puree) w/ Thin liquids; strict aspiration precautions; feeding support at meals w/ frequent Rest Breaks to lessen WOB.   Medication Administration: Whole meds with puree (as tolerates)    Other  Recommendations Recommended Consults:  (Dietician) Oral [name] Recommendations: Oral [name] BID;Staff/trained  caregiver to provide oral [name]   Follow up Recommendations  (TBD)      Frequency and Duration min 3x week  2 weeks       Prognosis Prognosis for Safe Diet Advancement: Fair Barriers to Reach Goals: Severity of  deficits (medical and pulmonary conditions overall)      Swallow Study   General Date of Onset: 09/28/16 HPI: Pt is a 75 y.o. female with a known history of Empyema, hypertension, atrial fibrillation, she [name] until October 2017- for empyema. Her [name] stay was complicated and she had to stay on ventilator for 15 days with chest tubes for drainage and she was on broad-spectrum antibiotic and also finally had trach and PEG. Since October she is [name] and she is there until now. She is given an oral diet and medications at nursing [name] after passing swallowing tests w/ Speech Therapy there and at [name](per daughters). Patient's history at admission was mainly obtained from was daughter who was present in the room. Pt was severely hypotensive and her Mikayla blood cell count 55,000. Patient was lethargic and not very responsive. She also have some greenish discharge around her PEG tube. As per daughter today, all other family members agreed about her critical condition and they would like to give a trial of antibiotic and IV fluid but no heroic measures, and if she does not improve then they would like to consider Hospice. Currently today, pt is more awake/alert and verbally conversive. She is able to follow basic commands w/ SLP and Daughters(present). Pt would like to eat/drink. Of note, pt is easily SOB/winded w/ any exertion including talking.  Type of Study: Bedside Swallow Evaluation Previous Swallow Assessment: previous evaluations and txs during the past [facility] at [facility] and [facility]. Diet Prior to this Study: Dysphagia 3 (soft);Thin liquids (per report after passing many swallow tests per pt/dtrs) Temperature Spikes Noted: No (99.4;  wbc elevated) Respiratory Status: Nasal cannula (4 liters) History of Recent Intubation: No (not recent) Behavior/Cognition: Alert;Cooperative;Pleasant mood;Distractible;Requires cueing Oral Cavity  Assessment: Dry Oral [name] Completed by SLP: Recent completion by staff Oral Cavity - Dentition: Adequate natural dentition;Missing dentition (few) Vision: Functional for self-feeding Self-Feeding Abilities: Needs assist;Needs set up;Total assist Patient Positioning: Upright in bed (head forward) Baseline Vocal Quality: Normal (gravely) Volitional Cough: Strong;Congested Volitional Swallow: Able to elicit    Oral/Motor/Sensory Function Overall Oral Motor/Sensory Function: Within functional limits   Ice Chips Ice chips: Within functional limits Presentation: Spoon (fed; 3 trials)   Thin Liquid Thin Liquid: Impaired Presentation: Straw (10+ trial sips(2-3 at a time intermittently)) Oral Phase Impairments:  (none) Oral Phase Functional Implications:  (none) Pharyngeal  Phase Impairments: Cough - Delayed;Throat Clearing - Delayed (x3) Other Comments: it was suggested by daughter that throat clearing has occured at SNF;  min increased WOB/SOB after consecutive sips - pinched straw to limit sips and rest breaks give to lessen WOB/SOB.    Nectar Thick Nectar Thick Liquid: Not tested   Honey Thick Honey Thick Liquid: Not tested   Puree Puree: Within functional limits Presentation: Spoon (fed; ~4 ozs total) Other Comments: min increased WOB if taking consecutive bites w/out rest break   Solid   GO   Solid: Not tested Other Comments: d/t overall weakness and WOB/SOB w/ exertion         Jerilynn SomKatherine Dakarri Kessinger, MS, CCC-SLP Shaida Route 09/29/2016,1:47 PM

## 2016-09-29 NOTE — Consult Note (Signed)
WOC Nurse wound consult note Reason for Consult: Empyema, s/p chest tube to right posterior lung field.  Dressing changed today.  Continuous with purulent, creamy drainage with musty odor.  Leaks continuously. Given one time dose of Zosyn.  Family admits they had not seen wound since admission to SNF and were emotional at seeing the site today.  Albumin currently 2.3 Stage 2 pressure injury to left posterior upper thigh. Moisture Associated Skin Damage (Incontinence associated) to buttocks, present on admission.  Wound type:Infectious, nonhealing surgical wound, right back Pressure Ulcer POA: Yes  Measurement:Left posterior leg 3 cm x 1 cm x 0.2 cm  Right upper back 1 cm x 1 cm x 6 cm tunnel at distal end of chest tube insertion site. Had packing strip in place and was draining copious, purulent effluent.  Wound bed:leg is pink and moist Cannot visualize wound bed to right posterior back  Drainage (amount, consistency, odor) Heavy purulent drainage.  Periwound:intact Dressing procedure/placement/frequency:Cleanse buttocks twice daily with soap and water and PRN incontiennce.  Apply barrier cream to red, denuded skin.  Cleanse left posterior leg wound with NS and pat gently dry.  Apply silicone border foam dressing. Change every three days and PRN soilage.   Cleanse right posterior back wound with NS and pat gently dry.  Gently fill wound depth with 1/2 Aquacel AG sheet.  Cover with 4x4 gauze and ABD pad.  Barrier cream to periwound skin for protection from moisture/drainage. Change daily.  Will not follow at this time.  Please re-consult if needed.  Maple HudsonKaren Timoth Schara RN BSN CWON Pager [phone]

## 2016-09-29 NOTE — Progress Notes (Signed)
ANTICOAGULATION CONSULT NOTE - Initial Consult  Pharmacy Consult for warfarin Indication: atrial fibrillation  Allergies  Allergen Reactions  . Sulfa Antibiotics Hives    Patient Measurements: Height: 5\' 4"  (162.6 cm) Weight: 227 lb 14.4 oz (103.4 kg) IBW/kg (Calculated) : 54.7  Vital Signs: Temp: 97.8 F (36.6 C) (12/05 1357) Temp Source: Oral (12/05 1357) BP: 96/71 (12/05 1357) Pulse Rate: 100 (12/05 1357)  Labs:  Recent Labs  09/28/16 1022 09/28/16 1716 09/29/16 1554  HGB 9.8*  --   --   HCT 31.1*  --   --   PLT 358  --   --   LABPROT  --  20.3* 18.9*  INR  --  1.71 1.57  CREATININE 1.95*  --   --   TROPONINI <0.03  --   --     Estimated Creatinine Clearance: 28.7 mL/min (by C-G formula based on SCr of 1.95 mg/dL (H)).   Medical History: Past Medical History:  Diagnosis Date  . Atrial fibrillation (HCC)   . Empyema [facility](HCC)    in [facility] Aug 2017- stayed in hospital on for 2 months.  . Hypertension     Assessment: Patient admitted with IAI. On warfarin 4mg  daily PTA. Last dose given 12/3  Date INR Warfarin Dose 12/4 1.71 None given 12/5 1.57  Goal of Therapy:  INR 2-3   Plan:  INR subtherapeutic on admission, down to 1.57 today. Last dose of warfarin given 12/3 Patient initiated on ciprofloxacin and metronidazole, both of which may increase therapeutic effect of warfarin. Will give home dose of warfarin 4mg  x 1, recheck INR with AM labs.  Ottavio Norem C 09/29/2016,5:32 PM

## 2016-09-29 NOTE — Care Management Important Message (Signed)
Important Message  Patient Details  Name: Doristine JohnsCatherine R Cuneo MRN: [id number] Date of Birth: 07/24/1941   Medicare Important Message Given:  Yes    Gwenette GreetBrenda S Rashaad Hallstrom, RN 09/29/2016, 11:37 AM

## 2016-09-29 NOTE — Consult Note (Signed)
                                                                                 Consultation Note Date: 09/29/2016   Patient Name: Mikayla [name]  DOB: 02/25/1941  MRN: [id number]  Age / Sex: 75 y.o., female  PCP: Pcp Not In System Referring Physician: Qing Chen, MD  Reason for Consultation: Establishing goals of care and Hospice Evaluation  HPI/Patient Profile: 75 y.o. female  with past medical history of hypertension, atrial fibrillation, and empyema admitted on 09/28/2016 with fever, diarrhea, and lethargy. Patient was hospitalized at [facility] from August 2017 till October 2017 due to empyema and complications from this. She was on the ventilator for 15 days, eventually with trach and PEG. Discharged to [facility]. Per daughter, patient has had intermittent fevers for 34 days with negative chest xray. Sent to ED on 09/28/16 where she was severely hypotensive with a white blood cell count of 55,000. Daughters wanted trial of IVF and antibiotics but did not want heroic measures if the patient did not show improvement. She was transitioned to comfort measures only in the evening of 12/4. Palliative medicine consultation for goals of care.   Clinical Assessment and Goals of Care: This NP met with two daughters and patient at bedside. Introduced palliative care. This morning, patient is alert, oriented to person and place, answers questions, and follows commands. She is hard of hearing per daughter. She denies pain or discomfort. She tells me she is thirsty and hungry.  Daughters, [name] and [name], initially tell me about their mother's past. She is originally from [place] but moved to [place] area to live near her daughters after her husband died in 2010. She was living with [name] prior to hospitalization in August. She loves spending time with her four grandchildren. She is a spiritual individual. Daughters describe her experience at [facility] including multiple  complications and transition to rehab facility. They feel blessed she survived that hospitalization and were able to have more quality time with her.  Last night, family was aware of how critical she was with poor prognosis. They opted for comfort measures only and tell me they would not want their mother to be resuscitated or on life support again. They really don't want the PEG tube in any longer and tell me they would not prolong her life with artificial feedings if she was unable to eat. Prior to this hospitalization, she did pass a speech swallow evaluation at the facility and was eating/drinking by mouth. Today, seeing their mother has shown improvement in cognition and asking for food, they have hope that they may have a little more time with her.   Daughters do remain realistic to the severity of sepsis, infection, and elevated WBC's and that this may continue to be a cycle for her. Discussed the importance of nutrition with wound healing at her infected chest tube site. Discussed that this may be the surge of energy we see at the EOL. They do understand that antibiotics/IVF are prolonging but feel they need to try antibiotics again to see if she will continue to show improvements like today. They feel if they   do not try antibiotics again (with her improvement in cognition) that they are giving up and did not do "everything possible" for her. If she is able to pass a swallow evaluation, they would also like for her to take her home medications again. If she does not show improvements or declines again, they agree with hospice services. They do not want their mother to suffer. They are a spiritual family and leaving this "in the Lord's hands."   I did discuss hospice options and MOST form that should be completed if she returns to a facility. Daughters agree with this. Discussed with Dr. Chen that we will see how she does in the next 24 hours to determine disposition. He agrees with this plan.      Answered questions. Hard choices copy given. Spiritual and emotional support given.     HCPOA-daughters ([name] and [name])   SUMMARY OF RECOMMENDATIONS    DNR/DNI per patient and family. They do not want heroic measures to keep her alive but remain hopeful with her improvement in cognition today and wanting antibiotics to be re-started.   Speech therapy consulted.   Discussed with Dr. Chen. Monitor for the next 24 hours. If patient declines, she will be eligible for hospice home.   Discussed palliative, hospice services, and MOST form with daughters.  PMT will f/u tomorrow, 12/6 to further discuss GOC and disposition.    Code Status/Advance Care Planning:  DNR   Symptom Management:   Per attending  Palliative Prophylaxis:   Aspiration, Delirium Protocol, Oral Care and Turn Reposition  Psycho-social/Spiritual:   Desire for further Chaplaincy support:yes  Additional Recommendations: Caregiving  Support/Resources and Education on Hospice  Prognosis:   Unable to determine  Discharge Planning: To Be Determined      Primary Diagnoses: Present on Admission: **None**   I have reviewed the medical record, interviewed the patient and family, and examined the patient. The following aspects are pertinent.  Past Medical History:  Diagnosis Date  . Atrial fibrillation (HCC)   . Empyema (HCC)    in [facility] Aug 2017- stayed in hospital on for 2 months.  . Hypertension    Social History   Social History  . Marital status: Widowed    Spouse name: N/A  . Number of children: N/A  . Years of education: N/A   Social History Main Topics  . Smoking status: Former Smoker    Types: Cigarettes  . Smokeless tobacco: Former User     Comment: quit in 2001. smoked for 30 years  . Alcohol use No  . Drug use: No  . Sexual activity: No   Other Topics Concern  . None   Social History Narrative  . None   Family History  Problem Relation Age of Onset  . Hypertension  Mother    Scheduled Meds: . mouth rinse  15 mL Mouth Rinse BID   Continuous Infusions: PRN Meds:. Medications Prior to Admission:  Prior to Admission medications   Medication Sig Start Date End Date Taking? Authorizing Provider  acetaminophen (TYLENOL) 500 MG tablet Take 1,000 mg by mouth every 8 (eight) hours as needed for moderate pain or fever.   Yes Historical Provider, MD  amLODipine (NORVASC) 10 MG tablet Take 10 mg by mouth daily.   Yes Historical Provider, MD  carbidopa-levodopa (SINEMET IR) 10-100 MG tablet Take 1 tablet by mouth 3 (three) times daily.   Yes Historical Provider, MD  carvedilol (COREG) 12.5 MG tablet Take 12.5 mg by mouth 2 (  two) times daily with a meal.   Yes Historical Provider, MD  celecoxib (CELEBREX) 100 MG capsule Take 100 mg by mouth 2 (two) times daily.   Yes Historical Provider, MD  furosemide (LASIX) 20 MG tablet Take 20 mg by mouth.   Yes Historical Provider, MD  gabapentin (NEURONTIN) 300 MG capsule Take 300 mg by mouth 3 (three) times daily.   Yes Historical Provider, MD  ipratropium-albuterol (DUONEB) 0.5-2.5 (3) MG/3ML SOLN Take 3 mLs by nebulization 3 (three) times daily.   Yes Historical Provider, MD  Melatonin 5 MG TABS Take 5 mg by mouth at bedtime.   Yes Historical Provider, MD  omeprazole (PRILOSEC OTC) 20 MG tablet Take 20 mg by mouth 2 (two) times daily.   Yes Historical Provider, MD  PARoxetine (PAXIL) 30 MG tablet Take 30 mg by mouth daily.    Yes Historical Provider, MD  potassium chloride SA (K-DUR,KLOR-CON) 20 MEQ tablet Take 20 mEq by mouth daily.   Yes Historical Provider, MD  sodium chloride (OCEAN) 0.65 % SOLN nasal spray Place 1 spray into both nostrils every 2 (two) hours as needed for congestion (allergies).   Yes Historical Provider, MD  traZODone (DESYREL) 50 MG tablet Take 25 mg by mouth at bedtime.   Yes Historical Provider, MD  warfarin (COUMADIN) 4 MG tablet Take 4 mg by mouth daily.   Yes Historical Provider, MD    ciprofloxacin (CIPRO) 250 MG tablet Take 1 tablet (250 mg total) by mouth every 12 (twelve) hours. Patient not taking: Reported on 09/28/2016 03/22/15   Norval Gable, MD   Allergies  Allergen Reactions  . Sulfa Antibiotics Hives   Review of Systems  Unable to perform ROS: Acuity of condition   Physical Exam  Constitutional: She is cooperative. She appears ill.  Cardiovascular: An irregularly irregular rhythm present. Exam reveals distant heart sounds.   Pulmonary/Chest: Accessory muscle usage present. She has wheezes.  Abdominal: Soft. Bowel sounds are normal. There is no tenderness.  PEG in place  Musculoskeletal: She exhibits edema (generalized).  Neurological: She is alert.  Oriented to person and place  Skin: Skin is warm and dry. There is pallor.  Psychiatric: She has a normal mood and affect. Her speech is normal and behavior is normal.  Nursing note and vitals reviewed.  Vital Signs: BP (!) 87/47 (BP Location: Right Arm)   Pulse (!) 134   Temp 99.4 F (37.4 C) (Oral)   Resp 20   Ht 5' 4" (1.626 m)   Wt 103.4 kg (227 lb 14.4 oz)   SpO2 99%   BMI 39.12 kg/m  Pain Assessment: No/denies pain POSS *See Group Information*: 2-Acceptable,Slightly drowsy, easily aroused Pain Score: 0-No pain  SpO2: SpO2: 99 % O2 Device:SpO2: 99 % O2 Flow Rate: .O2 Flow Rate (L/min): 4 L/min  IO: Intake/output summary:   Intake/Output Summary (Last 24 hours) at 09/29/16 1130 Last data filed at 09/28/16 2000  Gross per 24 hour  Intake             2200 ml  Output                0 ml  Net             2200 ml    LBM: Last BM Date: 09/28/16 Baseline Weight: Weight: 104.3 kg (230 lb) Most recent weight: Weight: 103.4 kg (227 lb 14.4 oz)     Palliative Assessment/Data: PPS 30%   Flowsheet Rows   Flowsheet Row Most Recent  Value  Intake Tab  Referral Department  Hospitalist  Unit at Time of Referral  Oncology Unit  Palliative Care Primary Diagnosis  Sepsis/Infectious Disease  Date  Notified  09/28/16  Palliative Care Type  New Palliative care  Reason for referral  Clarify Goals of Care  Date of Admission  09/28/16  # of days IP prior to Palliative referral  0  Clinical Assessment  Palliative Performance Scale Score  30%  Psychosocial & Spiritual Assessment  Palliative Care Outcomes  Patient/Family meeting held?  Yes  Who was at the meeting?  patient and daughters  Palliative Care Outcomes  Clarified goals of care, Counseled regarding hospice, Provided advance care planning, Provided psychosocial or spiritual support      Time In: 0915 Time Out: 1045 Time Total: 47mn Greater than 50%  of this time was spent counseling and coordinating care related to the above assessment and plan.  Signed by:  MIhor Dow FNP-C Palliative Medicine Team  Phone: [phone]: [phone]  Please contact Palliative Medicine Team phone at [phone] questions and concerns.  For individual provider: See AShea Evans

## 2016-09-29 NOTE — Progress Notes (Signed)
Nutrition Brief Note  75 y.o. female with a known history of Empyema, hypertension, atrial fibrillation, she was [facility] until October 2017- for empyema. Her hospital stay was complicated and she had to stay on ventilator for 15 days with chest tubes for drainage and she was on broad-spectrum antibiotic and also finally had tach and PEG. Since October [facility] is [facility] and she is there until now. She is given oral feeding and medications at nursing home and advised to have lost her PEG tube. Patient found to have septic shock, atrial fibrillation, and acute renal failure.  Patient identified to be seen for Malnutrition Screening Tool. Chart reviewed. Patient now transitioning to comfort care. Patient with poor prognosis (hours to day). Patient is NPO.  No nutrition interventions warranted at this time. Please consult Dietitian as needed.   Helane RimaLeanne Daveon Arpino, MS, RD, LDN Pager: [phone] After Hours Pager: [phone]

## 2016-09-30 DIAGNOSIS — L8990 Pressure ulcer of unspecified site, unspecified stage: Secondary | ICD-10-CM | POA: Insufficient documentation

## 2016-09-30 LAB — BLOOD CULTURE ID PANEL (REFLEXED)
ACINETOBACTER BAUMANNII: NOT DETECTED
CANDIDA KRUSEI: NOT DETECTED
Candida albicans: NOT DETECTED
Candida glabrata: NOT DETECTED
Candida parapsilosis: NOT DETECTED
Candida tropicalis: NOT DETECTED
ENTEROCOCCUS SPECIES: NOT DETECTED
ESCHERICHIA COLI: NOT DETECTED
Enterobacter cloacae complex: NOT DETECTED
Enterobacteriaceae species: NOT DETECTED
HAEMOPHILUS INFLUENZAE: NOT DETECTED
Klebsiella oxytoca: NOT DETECTED
Klebsiella pneumoniae: NOT DETECTED
LISTERIA MONOCYTOGENES: NOT DETECTED
METHICILLIN RESISTANCE: DETECTED — AB
NEISSERIA MENINGITIDIS: NOT DETECTED
PROTEUS SPECIES: NOT DETECTED
PSEUDOMONAS AERUGINOSA: NOT DETECTED
SERRATIA MARCESCENS: NOT DETECTED
STAPHYLOCOCCUS AUREUS BCID: NOT DETECTED
STAPHYLOCOCCUS SPECIES: DETECTED — AB
STREPTOCOCCUS AGALACTIAE: NOT DETECTED
STREPTOCOCCUS PNEUMONIAE: NOT DETECTED
STREPTOCOCCUS SPECIES: NOT DETECTED
Streptococcus pyogenes: NOT DETECTED

## 2016-09-30 LAB — BASIC METABOLIC PANEL
ANION GAP: 8 (ref 5–15)
BUN: 37 mg/dL — ABNORMAL HIGH (ref 6–20)
CALCIUM: 8.3 mg/dL — AB (ref 8.9–10.3)
CO2: 24 mmol/L (ref 22–32)
Chloride: 103 mmol/L (ref 101–111)
Creatinine, Ser: 2.05 mg/dL — ABNORMAL HIGH (ref 0.44–1.00)
GFR, EST AFRICAN AMERICAN: 26 mL/min — AB (ref >60)
GFR, EST NON AFRICAN AMERICAN: 22 mL/min — AB (ref >60)
GLUCOSE: 86 mg/dL (ref 65–99)
POTASSIUM: 3.5 mmol/L (ref 3.5–5.1)
Sodium: 135 mmol/L (ref 135–145)

## 2016-09-30 LAB — PROTIME-INR
INR: 1.54
Prothrombin Time: 18.6 seconds — ABNORMAL HIGH (ref 11.4–15.2)

## 2016-09-30 LAB — CBC
HEMATOCRIT: 29.2 % — AB (ref 35.0–47.0)
Hemoglobin: 9.3 g/dL — ABNORMAL LOW (ref 12.0–16.0)
MCH: 23.7 pg — ABNORMAL LOW (ref 26.0–34.0)
MCHC: 31.8 g/dL — ABNORMAL LOW (ref 32.0–36.0)
MCV: 74.6 fL — AB (ref 80.0–100.0)
PLATELETS: 271 10*3/uL (ref 150–440)
RBC: 3.91 MIL/uL (ref 3.80–5.20)
RDW: 20.2 % — ABNORMAL HIGH (ref 11.5–14.5)
WBC: 25.9 10*3/uL — AB (ref 3.6–11.0)

## 2016-09-30 MED ORDER — IPRATROPIUM-ALBUTEROL 0.5-2.5 (3) MG/3ML IN SOLN
3.0000 mL | Freq: Four times a day (QID) | RESPIRATORY_TRACT | Status: DC
  Administered 2016-09-30: 21:00:00 3 mL via RESPIRATORY_TRACT
  Filled 2016-09-30: qty 3

## 2016-09-30 MED ORDER — SODIUM CHLORIDE 0.9 % IV SOLN
INTRAVENOUS | Status: DC
Start: 2016-09-30 — End: 2016-10-01
  Administered 2016-09-30 – 2016-10-01 (×2): via INTRAVENOUS

## 2016-09-30 MED ORDER — IPRATROPIUM-ALBUTEROL 0.5-2.5 (3) MG/3ML IN SOLN
3.0000 mL | Freq: Four times a day (QID) | RESPIRATORY_TRACT | Status: DC | PRN
  Administered 2016-10-02: 09:00:00 3 mL via RESPIRATORY_TRACT

## 2016-09-30 MED ORDER — IPRATROPIUM-ALBUTEROL 0.5-2.5 (3) MG/3ML IN SOLN
3.0000 mL | Freq: Four times a day (QID) | RESPIRATORY_TRACT | Status: DC | PRN
  Filled 2016-09-30: qty 3

## 2016-09-30 MED ORDER — CIPROFLOXACIN HCL 500 MG PO TABS
500.0000 mg | ORAL_TABLET | Freq: Every day | ORAL | Status: DC
  Administered 2016-09-30 – 2016-10-01 (×2): 500 mg via ORAL
  Filled 2016-09-30 (×2): qty 1

## 2016-09-30 MED ORDER — WARFARIN SODIUM 2 MG PO TABS
2.0000 mg | ORAL_TABLET | Freq: Once | ORAL | Status: AC
Start: 2016-09-30 — End: 2016-09-30
  Administered 2016-09-30: 2 mg via ORAL
  Filled 2016-09-30: qty 1

## 2016-09-30 NOTE — Progress Notes (Signed)
Daily Progress Note   Patient Name: Mikayla [name]       Date: 09/30/2016 DOB: 04/19/1941  Age: 75 y.o. MRN#: [id number] Attending Physician: Shaune PollackQing Chen, MD Primary Care Physician: Pcp Not In System Admit Date: 09/28/2016  Reason for Consultation/Follow-up: Establishing goals of care and Hospice Evaluation  Subjective: Patient alert and oriented to person and place this afternoon. Denies pain or discomfort. She tells me she has not eaten lunch yet but waiting for her daughters to get back to the hospital. Audible wheezes but denies shortness of breath.   Left VM for daughter, Mikayla [name]. Will discuss MOST form with her in AM. Per Dr. Imogene Burnhen, patient may be here for another two days for antibiotics.   Length of Stay: 2  Current Medications: Scheduled Meds:  . ciprofloxacin  500 mg Oral q1800  . mouth rinse  15 mL Mouth Rinse BID  . metroNIDAZOLE  500 mg Oral TID  . pantoprazole  40 mg Oral BID  . PARoxetine  30 mg Oral Daily  . traZODone  25 mg Oral QHS  . Warfarin - Pharmacist Dosing Inpatient   Does not apply q1800   Continuous Infusions:  PRN Meds: acetaminophen, ipratropium-albuterol, ondansetron (ZOFRAN) IV  Physical Exam  Constitutional: She is cooperative.  Cardiovascular: Regular rhythm.  Exam reveals distant heart sounds.   Pulmonary/Chest: No accessory muscle usage. No respiratory distress. She has wheezes.  Abdominal: Soft. Bowel sounds are normal.  PEG  Musculoskeletal: She exhibits edema (generalized).  Neurological: She is alert.  Oriented to person and place  Skin: Skin is warm and dry. There is pallor.  Psychiatric: She has a normal mood and affect. Her speech is normal and behavior is normal.  Nursing note and vitals reviewed.          Vital Signs: BP 99/75    Pulse 80   Temp 97.6 F (36.4 C) (Oral)   Resp (!) 21   Ht 5\' 4"  (1.626 m)   Wt 103.4 kg (227 lb 14.4 oz)   SpO2 98%   BMI 39.12 kg/m  SpO2: SpO2: 98 % O2 Device: O2 Device: Nasal Cannula O2 Flow Rate: O2 Flow Rate (L/min): 3 L/min  Intake/output summary:  Intake/Output Summary (Last 24 hours) at 09/30/16 1329 Last data filed at 09/30/16 0600  Gross per 24 hour  Intake             1288 ml  Output                0 ml  Net             1288 ml   LBM: Last BM Date: 09/30/16 Baseline Weight: Weight: 104.3 kg (230 lb) Most recent weight: Weight: 103.4 kg (227 lb 14.4 oz)  Palliative Assessment/Data: PPS 30%   Flowsheet Rows   Flowsheet Row Most Recent Value  Intake Tab  Referral Department  Hospitalist  Unit at Time of Referral  Oncology Unit  Palliative Care Primary Diagnosis  Sepsis/Infectious Disease  Date Notified  09/28/16  Palliative Care Type  New Palliative care  Reason for referral  Clarify Goals of Care  Date of Admission  09/28/16  # of days IP prior to Palliative referral  0  Clinical Assessment  Palliative Performance Scale Score  30%  Psychosocial & Spiritual Assessment  Palliative Care Outcomes  Patient/Family meeting held?  Yes  Who was at the meeting?  patient and daughters  Palliative Care Outcomes  Clarified goals of care, Counseled regarding hospice, Provided advance care planning, Provided psychosocial or spiritual support      Patient Active Problem List   Diagnosis Date Noted  . Pressure injury of skin 09/30/2016  . Palliative care encounter   . Goals of care, counseling/discussion   . Encounter for hospice care discussion   . Septic shock (HCC) 09/28/2016  . Diarrhea 09/28/2016    Palliative Care Assessment & Plan   Patient Profile: 75 y.o. female  with past medical history of hypertension, atrial fibrillation, and empyema admitted on 09/28/2016 with fever, diarrhea, and lethargy. [facility] from [facility]  [facility] till October 2017 due to empyema and complications from this. She was on the ventilator for 15 days, eventually with trach and PEG. [facility]. Per daughter, patient has had intermittent fevers for 34 days with negative chest xray. Sent to ED on 09/28/16 where she was severely hypotensive with a white blood cell count of 55,000. Daughters wanted trial of IVF and antibiotics but did not want heroic measures if the patient did not show improvement. She was transitioned to comfort measures only in the evening of 12/4. Palliative medicine consultation for goals of care. Patient showed improvement in cognition on 12/5. Daughters requesting diet, antibiotics, and home medications to be re-started.   Assessment: Septic shock Colitis  Atrial Fibrillation Acute renal failure with hyponatremia Malnutrition  Recommendations/Plan:  DNR/DNI  Wheezing-Duoneb q6h prn wheezing/SOB  Unable to touch base with daughters today. Will f/u in AM and discuss MOST form.   Recommend palliative services to follow at SNF. If patient should decline, daughters would be open to hospice discussions. Social work consulted.  Goals of Care and Additional Recommendations:  Limitations on Scope of Treatment: Full Scope Treatment-except DNR/DNI  Code Status:   DNR/DNI   Code Status Orders        Start     Ordered   09/28/16 1556  Do not attempt resuscitation (DNR)  Continuous    Question Answer Comment  In the event of cardiac or respiratory ARREST Do not call a "code blue"   In the event of cardiac or respiratory ARREST Do not perform Intubation, CPR, defibrillation or ACLS   In the event of cardiac or respiratory ARREST Use medication by any route, position, wound care, and other measures to  relive pain and suffering. May use oxygen, suction and manual treatment of airway obstruction as needed for comfort.      09/28/16 1555    Code Status History    Date Active Date Inactive Code  Status Order ID Comments User Context   This patient has a current code status but no historical code status.    Advance Directive Documentation   Flowsheet Row Most Recent Value  Type of Advance Directive  Healthcare Power of Attorney  Pre-existing out of facility DNR order (yellow form or pink MOST form)  No data  "MOST" Form in Place?  No data       Prognosis:   Unable to determine  Discharge Planning:  Skilled Nursing Facility for rehab with Palliative care service follow-up  Care plan was discussed with patient, RN, SW, care management, and Dr. Imogene Burn.   Thank you for allowing the Palliative Medicine Team to assist in the care of this patient.   Time In: 1300 Time Out: 1335 Total Time Prolonged Time Billed  no       Greater than 50%  of this time was spent counseling and coordinating care related to the above assessment and plan.  Vennie Homans, FNP-C Palliative Medicine Team  Phone: [phone] Fax: [phone]  Please contact Palliative Medicine Team phone at [phone] for questions and concerns.

## 2016-09-30 NOTE — Progress Notes (Signed)
Advanced Care Plan.  Purpose of Encounter: Hospice care or hospice home. Parties in Attendance: The patient, her 2 daughters and me. Patient's Decisional Capacity: No. Subjective/Patient Story: Mikayla [name]  is a 75 y.o. female with a known history of Empyema, hypertension, atrial fibrillation, she was [facility] until October 2017- for empyema. Her hospital stay was complicated and she had to stay on ventilator for 15 days with chest tubes for drainage and she was on broad-spectrum antibiotic and also finally had tach and PEG. She had septic shock, acute respiratory failure and acute renal failure.    Objective/Medical Story: She has septic shock, acute respiratory failure and acute renal failure. On admission, the patient's daughter decided comfort care for the patient. Then they changed her mind and want treatment. I discussed with the 2 daughters about patient's critical condition and the poor prognosis, as well as hospice care or hospice home. They decided to continue current treatment and skilled nursing home placement.  Goals of Care Determinations: Hospice care or hospice home.  Plan:  Code Status: DO NOT RESUSCITATE Palliative services to follow at SNF. Time spent discussing advance care planning: 18 minutes.

## 2016-09-30 NOTE — Progress Notes (Signed)
Checked to see if pre-authorization would be needed for non-emergent EMS transport. Per [employer] benefits obtained online through [employer], patient has a [employer] Medicare Advantage PPO policy.  Medicare PPO plans do not require pre-auth for non-emergent ground transports using service codes A0426 or A0428.   

## 2016-09-30 NOTE — Progress Notes (Signed)
[employer] - [place] [facility]   PATIENT NAME: Mikayla [name]    MR#:  [id number]  DATE OF BIRTH:  08/24/1941  SUBJECTIVE:  CHIEF COMPLAINT:   Chief Complaint  Patient presents with  . Code Sepsis   Pt is awake, alert and oriented today. She is on oxygen [place] 3L. REVIEW OF SYSTEMS:  Review of Systems  Unable to perform ROS: Acuity of condition  Constitutional: Positive for malaise/fatigue. Negative for chills and fever.  HENT: Negative for congestion.   Eyes: Negative for blurred vision and double vision.  Respiratory: Negative for cough, shortness of breath and stridor.   Cardiovascular: Negative for chest pain and leg swelling.  Gastrointestinal: Negative for abdominal pain, diarrhea, nausea and vomiting.  Genitourinary: Negative for dysuria and hematuria.  Musculoskeletal: Negative for joint pain.  Neurological: Positive for weakness. Negative for dizziness, focal weakness and loss of consciousness.  Psychiatric/Behavioral: The patient is not nervous/anxious.     DRUG ALLERGIES:   Allergies  Allergen Reactions  . Sulfa Antibiotics Hives   VITALS:  Blood pressure 138/70, pulse (!) 143, temperature 97.6 F (36.4 C), temperature source Oral, resp. rate (!) 21, height 5\' 4"  (1.626 m), weight 227 lb 14.4 oz (103.4 kg), SpO2 98 %. PHYSICAL EXAMINATION:  Physical Exam  Constitutional: She is oriented to person, place, and time and well-developed, well-nourished, and in no distress.  Obese.  HENT:  Head: Normocephalic.  Dry oral mucosa.  Eyes: Conjunctivae and EOM are normal. No scleral icterus.  Neck: Normal range of motion. Neck supple. No JVD present. No tracheal deviation present.  Cardiovascular: Normal rate, regular rhythm and normal heart sounds.  Exam reveals no gallop.   No murmur heard. tachycardia  Pulmonary/Chest: Effort normal. No respiratory distress. She has wheezes. She has no rales.  Abdominal: Soft. Bowel sounds are normal. She  exhibits no distension. There is no tenderness. There is no rebound and no guarding.  Musculoskeletal: She exhibits no edema or tenderness.  Neurological: She is alert and oriented to person, place, and time. No cranial nerve deficit.  Confused, AAOX2  Skin: No rash noted. No erythema.  Psychiatric: Affect normal.   LABORATORY PANEL:   CBC  Recent Labs Lab 09/30/16 0508  WBC 25.9*  HGB 9.3*  HCT 29.2*  PLT 271   ------------------------------------------------------------------------------------------------------------------ Chemistries   Recent Labs Lab 09/28/16 1022 09/30/16 0508  NA 131* 135  K 3.6 3.5  CL 95* 103  CO2 25 24  GLUCOSE 113* 86  BUN 16 37*  CREATININE 1.95* 2.05*  CALCIUM 8.5* 8.3*  AST 54*  --   ALT 10*  --   ALKPHOS 137*  --   BILITOT 1.2  --    RADIOLOGY:  No results found. ASSESSMENT AND PLAN:   * Septic shock, improved. The patient was treated with antibiotics and IV fluid bolus in the ED. Family later decided palliative care and comfort care. But her daughters want treatment with abx and IVF, resumed home medication. The patient was treated with IV normal saline bolus and continue IV normal saline. Hypotension improved, leukocytosis is improving. continue Cipro and flagyle, follow-up CBC..  C. Difficile colitis.  PCR positive for C. Difficile toxins.   continue Cipro and flagyle, follow-up CBC.   * Atrial fibrillation with RVR Unable to give Coreg due to low BP. May resume if BP is stable. Resumed Coumadin PTD.  * Acute renal failure, due to ATN Secondary to septic shock.   [facility]. Worsening, continue IV fluid support and follow-up BMP.  Hyponatremia. Improved with normal saline IV.  * Severe malnutrition Started diet, dietitian consult.  * Acute respiratory failure with hypoxia  COPD with history of empyema.  Continue O2 [place] 3L, NEB.   poor prognosis. Her daughters months treatment and do  not want hospice care at this time.  All the records are reviewed and case discussed with Care Management/Social Worker. Management plans discussed with the patient, daughters and they are in agreement.  CODE STATUS: DNR  TOTAL CRITICAL TIME TAKING CARE OF THIS PATIENT: 37 minutes.   More than 50% of the time was spent in counseling/coordination of care: YES  POSSIBLE D/C IN 2 DAYS, DEPENDING ON CLINICAL CONDITION.   Shaune Pollackhen, Cebert Dettmann M.D on 09/30/2016 at 6:00 PM  Between 7am to 6pm - Pager - [phone]  After 6pm go to [email or web address] - Social research officer, governmentpassword [email or web address]  [employer] [place] [facility]  Office  [phone]  CC: Primary care physician; Pcp Not In System  Note: This dictation was prepared with Dragon dictation along with smaller phrase technology. Any transcriptional errors that result from this process are unintentional.

## 2016-09-30 NOTE — Progress Notes (Signed)
ANTICOAGULATION CONSULT NOTE - Initial Consult  Pharmacy Consult for warfarin Indication: atrial fibrillation  Allergies  Allergen Reactions  . Sulfa Antibiotics Hives    Patient Measurements: Height: 5\' 4"  (162.6 cm) Weight: 227 lb 14.4 oz (103.4 kg) IBW/kg (Calculated) : 54.7  Vital Signs: Temp: 97.6 F (36.4 C) (12/06 0404) Temp Source: Oral (12/06 0404) BP: 99/75 (12/06 0404) Pulse Rate: 80 (12/06 0404)  Labs:  Recent Labs  09/28/16 1022 09/28/16 1716 09/29/16 1554 09/30/16 0508  HGB 9.8*  --   --  9.3*  HCT 31.1*  --   --  29.2*  PLT 358  --   --  271  LABPROT  --  20.3* 18.9* 18.6*  INR  --  1.71 1.57 1.54  CREATININE 1.95*  --   --  2.05*  TROPONINI <0.03  --   --   --     Estimated Creatinine Clearance: 27.3 mL/min (by C-G formula based on SCr of 2.05 mg/dL (H)).   Medical History: Past Medical History:  Diagnosis Date  . Atrial fibrillation (HCC)   . Empyema [facility](HCC)    in [facility] Aug 2017- stayed in hospital on for 2 months.  . Hypertension     Assessment: Patient admitted with IAI. On warfarin 4mg  daily PTA. Last dose given 12/3  Date INR Warfarin Dose 12/4 1.71 None given 12/5 1.57 4 mg  Goal of Therapy:  INR 2-3   Plan:  INR remains subtherapeutic, however, patient was started on metronidazole yesterday which has significant DDI with warfarin resulting in increased INR.  Will dose reduce warfarin in anticipation of DDI. Give warfarin 2 mg PO once this evening and recheck INR tomorrow morning.  Cindi CarbonMary M Basya Casavant, PharmD 09/30/2016,3:35 PM

## 2016-09-30 NOTE — Progress Notes (Signed)
New referral [facility] following discharge received from CSW [employer] Sample. Referral made aware. Thank you. Dayna BarkerKaren Robertson RN, BSN, [facility] [place], [facility] Liaison [phone] c

## 2016-09-30 NOTE — Progress Notes (Signed)
Initial Nutrition Assessment  DOCUMENTATION CODES:   Obesity unspecified  INTERVENTION:  Provide Magic cup TID with meals, each supplement provides 290 kcal and 9 grams of protein.  Will continue to monitor discussions regarding goals of care and adjust nutrition intervention as warranted.  NUTRITION DIAGNOSIS:   Inadequate oral intake related to lethargy/confusion, acute illness as evidenced by meal completion < 25%, other (see comment) (per RN report).  GOAL:   Patient will meet greater than or equal to 90% of their needs  MONITOR:   PO intake, Supplement acceptance, Labs, Weight trends, I & O's  REASON FOR ASSESSMENT:   Malnutrition Screening Tool    ASSESSMENT:   75 y.o. female with a known history of Empyema, hypertension, atrial fibrillation, she was admitted in [facility] from August 2017 until October 2017- for empyema. Her hospital stay was complicated and she had to stay on ventilator for 15 days with chest tubes for drainage and she was on broad-spectrum antibiotic and also finally had trach and PEG. Since October she is discharged to [facility] and she is there until now. She is given oral feeding and medications at nursing home and advised to have lost her PEG tube. Found to have septic shock, hypotension, colitis (positive for C. Difficile), Atrial fibrillation, acute renal failure and hyponatremia. Family initially opted for comfort care but have since decided for treatment with antibiotics and IVF.   Spoke with patient at bedside. She reports her appetite is "okay." Per RN she has only tolerated ice cream today and has also had some Magic Cup. She is also drinking water and taking pills. Patient is having diarrhea but denies N/V, abdominal pain, or difficulty chewing/swallowing. Patient refusing oral nutrition supplements offered. She is only amenable to [employer], which will not be enough to meet her needs.   Patient is unsure of UBW or if she has  lost weight. Limited weight history in chart. Patient was 260 lbs on 03/22/2015. She has lost 33 lbs (13% body weight) over unknown period of time during the past 1.5 years.  Per chart patient still has PEG tube in.   Medications reviewed and include: ciprofloxacin, Flagyl, pantoprazole, warfarin, NS @ 50 ml/hr.  Labs reviewed: BUN 37, Creatinine 2.05, EGFR 22.  Nutrition-Focused physical exam completed. Findings are no fat depletion, no muscle depletion, and mild edema.  At this time patient does not meet criteria for malnutrition. Will need to clarify weight history and have better understanding of recent energy intake to make diagnosis and patient is unable to provide more details.  Discussed with RN.  Diet Order:  DIET - DYS 1 Room service appropriate? Yes with Assist; Fluid consistency: Thin  Skin:  Wound (see comment) (Stg II to R Hip)  Last BM:  09/30/2016  Height:   Ht Readings from Last 1 Encounters:  09/28/16 '5\' 4"'  (1.626 m)    Weight:   Wt Readings from Last 1 Encounters:  09/28/16 227 lb 14.4 oz (103.4 kg)    Ideal Body Weight:  54.55 kg  BMI:  Body mass index is 39.12 kg/m.  Estimated Nutritional Needs:   Kcal:  1800-2120 (MSJ x 1.2-1.4)  Protein:  100-125 grams (1-1.2 grams/kg)  Fluid:  >/= 1.8 L/day  EDUCATION NEEDS:   Education needs no appropriate at this time  Willey Blade, MS, RD, LDN Pager: [phone] After Hours Pager: [phone]

## 2016-09-30 NOTE — Progress Notes (Signed)
Pharmacy Antibiotic Note  Mikayla [name] is a 75 y.o. female admitted on 09/28/2016 with intra-abdominal infection.  Pharmacy has been consulted for ciprofloxacin dosing.  Patient is currently receiving ciprofloxacin and metronidazole for IAI and metronidazole for positive C.diff toxins. This is day #2 of therapy.  Plan: Ciprofloxacin 500 mg PO daily  Height: 5\' 4"  (162.6 cm) Weight: 227 lb 14.4 oz (103.4 kg) IBW/kg (Calculated) : 54.7  Temp (24hrs), Avg:97.9 F (36.6 C), Min:97.6 F (36.4 C), Max:98.2 F (36.8 C)   Recent Labs Lab 09/28/16 1022 09/28/16 1715 09/30/16 0508  WBC 55.1*  --  25.9*  CREATININE 1.95*  --  2.05*  LATICACIDVEN 1.9 1.7  --     Estimated Creatinine Clearance: 27.3 mL/min (by C-G formula based on SCr of 2.05 mg/dL (H)).    Allergies  Allergen Reactions  . Sulfa Antibiotics Hives   Thank you for allowing pharmacy to be a part of this patient's care.  Cindi CarbonMary M Ambrose [name], PharmD Clinical Pharmacist 09/30/2016 2:49 PM

## 2016-09-30 NOTE — Progress Notes (Addendum)
Clinical Education officer, museum (CSW) met with patient and her 2 daughters were at bedside. CSW presented bed offers. Daughters chose for patient to return to [facility]. Per MD patient is not stable for D/C today. Deborah admissions coordinator at [facility] is aware of above. CSW will continue to follow and assist as needed.   CSW made [facility] liaison aware that patient will return to [facility] with palliative care following. Daughters are in agreement with palliative care to follow.   [employer], LCSW [phone]

## 2016-10-01 LAB — BASIC METABOLIC PANEL
Anion gap: 7 (ref 5–15)
BUN: 30 mg/dL — ABNORMAL HIGH (ref 6–20)
CHLORIDE: 103 mmol/L (ref 101–111)
CO2: 26 mmol/L (ref 22–32)
CREATININE: 1.33 mg/dL — AB (ref 0.44–1.00)
Calcium: 8.6 mg/dL — ABNORMAL LOW (ref 8.9–10.3)
GFR calc non Af Amer: 38 mL/min — ABNORMAL LOW (ref >60)
GFR, EST AFRICAN AMERICAN: 44 mL/min — AB (ref >60)
Glucose, Bld: 82 mg/dL (ref 65–99)
POTASSIUM: 3.1 mmol/L — AB (ref 3.5–5.1)
SODIUM: 136 mmol/L (ref 135–145)

## 2016-10-01 LAB — CBC
HCT: 32.7 % — ABNORMAL LOW (ref 35.0–47.0)
HEMOGLOBIN: 10.2 g/dL — AB (ref 12.0–16.0)
MCH: 23.6 pg — ABNORMAL LOW (ref 26.0–34.0)
MCHC: 31.2 g/dL — AB (ref 32.0–36.0)
MCV: 75.5 fL — ABNORMAL LOW (ref 80.0–100.0)
Platelets: 266 10*3/uL (ref 150–440)
RBC: 4.33 MIL/uL (ref 3.80–5.20)
RDW: 20.6 % — ABNORMAL HIGH (ref 11.5–14.5)
WBC: 16 10*3/uL — ABNORMAL HIGH (ref 3.6–11.0)

## 2016-10-01 LAB — PROTIME-INR
INR: 1.84
Prothrombin Time: 21.5 s — ABNORMAL HIGH (ref 11.4–15.2)

## 2016-10-01 LAB — MAGNESIUM: MAGNESIUM: 1.6 mg/dL — AB (ref 1.7–2.4)

## 2016-10-01 MED ORDER — SODIUM CHLORIDE 0.9 % IV SOLN
1500.0000 mg | Freq: Once | INTRAVENOUS | Status: AC
  Administered 2016-10-01: 04:00:00 1500 mg via INTRAVENOUS
  Filled 2016-10-01: qty 1500

## 2016-10-01 MED ORDER — POTASSIUM CHLORIDE CRYS ER 20 MEQ PO TBCR
40.0000 meq | EXTENDED_RELEASE_TABLET | Freq: Once | ORAL | Status: AC
  Administered 2016-10-01: 15:00:00 40 meq via ORAL
  Filled 2016-10-01: qty 2

## 2016-10-01 MED ORDER — MAGNESIUM SULFATE 2 GM/50ML IV SOLN
2.0000 g | Freq: Once | INTRAVENOUS | Status: AC
  Administered 2016-10-01: 19:00:00 2 g via INTRAVENOUS
  Filled 2016-10-01: qty 50

## 2016-10-01 MED ORDER — CARVEDILOL 6.25 MG PO TABS
6.2500 mg | ORAL_TABLET | Freq: Two times a day (BID) | ORAL | Status: DC
  Administered 2016-10-01 – 2016-10-02 (×2): 6.25 mg via ORAL
  Filled 2016-10-01 (×2): qty 1

## 2016-10-01 MED ORDER — WARFARIN SODIUM 1 MG PO TABS
1.0000 mg | ORAL_TABLET | Freq: Once | ORAL | Status: AC
  Administered 2016-10-01: 1 mg via ORAL
  Filled 2016-10-01: qty 1

## 2016-10-01 NOTE — Progress Notes (Signed)
Nutrition Follow-up  DOCUMENTATION CODES:   Obesity unspecified  INTERVENTION:  1. Magic cup TID with meals, each supplement provides 290 kcal and 9 grams of protein  2. Continue to monitor goals of care and desired nutrition interventions. Patient desired no changes at this time  3. NDD2 per SLP  NUTRITION DIAGNOSIS:   Inadequate oral intake related to lethargy/confusion, acute illness as evidenced by meal completion < 25%, other (see comment) (per RN report). -ongoing  GOAL:   Patient will meet greater than or equal to 90% of their needs -not meeting  MONITOR:   PO intake, Supplement acceptance, Labs, Weight trends, I & O's  REASON FOR ASSESSMENT:   Malnutrition Screening Tool    ASSESSMENT:   75 y.o. female with a known history of Empyema, hypertension, atrial fibrillation, she was admitted in [facility] from August 2017 until October 2017- for empyema. Her hospital stay was complicated and she had to stay on ventilator for 15 days with chest tubes for drainage and she was on broad-spectrum antibiotic and also finally had trach and PEG. Since October she is discharged [facility] and she is there until now. She is given oral feeding and medications at nursing home and advised to have lost her PEG tube. Found to have septic shock, hypotension, colitis (positive for C. Difficile), Atrial fibrillation, acute renal failure and hyponatremia. Family initially opted for comfort care but have since decided for treatment with antibiotics and IVF.  Patient was lethargic during visit but was able to voice that she liked food she received on NDD2 thus far. No family at bedside. Did not eat much food for lunch. [facility] will [facility]. MD still recommending hospice - would recommend as well given her limited PO intake. Labs and medications reviewed.  Diet Order:  DIET DYS 2 Room service appropriate? Yes with Assist; Fluid consistency: Thin  Skin:  Wound  (see comment) (Stg II to R Hip)  Last BM:  09/30/2016  Height:   Ht Readings from Last 1 Encounters:  09/28/16 5\' 4"  (1.626 m)    Weight:   Wt Readings from Last 1 Encounters:  09/28/16 227 lb 14.4 oz (103.4 kg)    Ideal Body Weight:  54.55 kg  BMI:  Body mass index is 39.12 kg/m.  Estimated Nutritional Needs:   Kcal:  1800-2120 (MSJ x 1.2-1.4)  Protein:  100-125 grams (1-1.2 grams/kg)  Fluid:  >/= 1.8 L/day  EDUCATION NEEDS:   Education needs no appropriate at this time  Dionne AnoWilliam M. Takiera Mayo, MS, RD LDN Inpatient Clinical Dietitian Pager [phone]

## 2016-10-01 NOTE — Progress Notes (Signed)
Daily Progress Note   Patient Name: Mikayla [name]       Date: 10/01/2016 DOB: 1941/09/07  Age: 75 y.o. MRN#: [id number] Attending Physician: Demetrios Loll, MD Primary Care Physician: Pcp Not In System Admit Date: 09/28/2016  Reason for Consultation/Follow-up: Establishing goals of care  Subjective: Met with patient and two daughters. Patient alert, oriented, answering questions, and following commands. No pain or discomfort. Completed the MOST form with daughters. Daughters hopeful for continued improvement from antibiotics and for wound to heal. Discussed my overall concern that this may become a cycle with continued infection, hospitalization, antibiotics, artificial hydration, and difficulty with wound healing because of poor nutritional status. Discussed kidney functions that have improved from the previous day but still elevated. Daughters seem realistic that this may become a cycle and time may be short but appreciative that she did 'bounce back' and they will have more quality of time with her for now. Daughters do not want their mother to suffer. Agreeable to have palliative services to follow at rehab facility and understand she would transition to hospice if she declined. Answered questions and offered support.   Length of Stay: 3  Current Medications: Scheduled Meds:  . ciprofloxacin  500 mg Oral q1800  . mouth rinse  15 mL Mouth Rinse BID  . metroNIDAZOLE  500 mg Oral TID  . pantoprazole  40 mg Oral BID  . PARoxetine  30 mg Oral Daily  . traZODone  25 mg Oral QHS  . Warfarin - Pharmacist Dosing Inpatient   Does not apply q1800   Continuous Infusions: . sodium chloride 50 mL/hr at 10/01/16 0705   PRN Meds: acetaminophen, ipratropium-albuterol, ipratropium-albuterol,  ondansetron (ZOFRAN) IV  Physical Exam  Constitutional: She is cooperative.  Cardiovascular: Regular rhythm.  Exam reveals distant heart sounds.   Pulmonary/Chest: No accessory muscle usage. No respiratory distress. She has wheezes.  Abdominal: Soft. Bowel sounds are normal.  PEG  Musculoskeletal: She exhibits edema (generalized).  Neurological: She is alert.  Oriented to person and place  Skin: Skin is warm and dry. There is pallor.  Psychiatric: She has a normal mood and affect. Her speech is normal and behavior is normal.  Nursing note and vitals reviewed.          Vital Signs: BP 139/65 (BP Location: Left Leg)  Pulse (!) 106   Temp 98 F (36.7 C) (Oral)   Resp 20   Ht '5\' 4"'  (1.626 m)   Wt 103.4 kg (227 lb 14.4 oz)   SpO2 98%   BMI 39.12 kg/m  SpO2: SpO2: 98 % O2 Device: O2 Device: Nasal Cannula O2 Flow Rate: O2 Flow Rate (L/min): 2 L/min  Intake/output summary:   Intake/Output Summary (Last 24 hours) at 10/01/16 1146 Last data filed at 10/01/16 0973  Gross per 24 hour  Intake             1886 ml  Output                0 ml  Net             1886 ml   LBM: Last BM Date: 09/30/16 Baseline Weight: Weight: 104.3 kg (230 lb) Most recent weight: Weight: 103.4 kg (227 lb 14.4 oz)  Palliative Assessment/Data: PPS 30%   Flowsheet Rows   Flowsheet Row Most Recent Value  Intake Tab  Referral Department  Hospitalist  Unit at Time of Referral  Oncology Unit  Palliative Care Primary Diagnosis  Sepsis/Infectious Disease  Date Notified  09/28/16  Palliative Care Type  New Palliative care  Reason for referral  Clarify Goals of Care  Date of Admission  09/28/16  # of days IP prior to Palliative referral  0  Clinical Assessment  Palliative Performance Scale Score  30%  Psychosocial & Spiritual Assessment  Palliative Care Outcomes  Patient/Family meeting held?  Yes  Who was at the meeting?  patient and daughters  Palliative Care Outcomes  Clarified goals of care,  Counseled regarding hospice, Provided advance care planning, Provided psychosocial or spiritual support      Patient Active Problem List   Diagnosis Date Noted  . Pressure injury of skin 09/30/2016  . Palliative care encounter   . Goals of care, counseling/discussion   . Encounter for hospice care discussion   . Septic shock ([place]) 09/28/2016  . Diarrhea 09/28/2016    Palliative Care Assessment & Plan   Patient Profile: 75 y.o. female  with past medical history of hypertension, atrial fibrillation, and empyema admitted on 09/28/2016 with fever, diarrhea, and lethargy. Patient was hospitalized at [facility] from August 2017 till October 2017 due to empyema and complications from this. She was on the ventilator for 15 days, eventually with trach and PEG. Discharged to [facility] home. Per daughter, patient has had intermittent fevers for 34 days with negative chest xray. Sent to ED on 09/28/16 where she was severely hypotensive with a white blood cell count of 55,000. Daughters wanted trial of IVF and antibiotics but did not want heroic measures if the patient did not show improvement. She was transitioned to comfort measures only in the evening of 12/4. Palliative medicine consultation for goals of care. Patient showed improvement in cognition on 12/5. Daughters requesting diet, antibiotics, and home medications to be re-started.   Assessment: Septic shock Colitis  Atrial Fibrillation Acute renal failure with hyponatremia Malnutrition  Recommendations/Plan:  DNR/DNI  MOST form completed and placed in chart.   Recommend palliative services to follow at SNF. If patient should decline, daughters would be open to hospice discussions.  Goals of Care and Additional Recommendations:  Limitations on Scope of Treatment: Full Scope Treatment-except DNR/DNI  Code Status:   DNR/DNI   Code Status Orders        Start     Ordered   09/28/16 1556  Do not attempt resuscitation  (DNR)  Continuous    Question Answer Comment  In the event of cardiac or respiratory ARREST Do not call a "code blue"   In the event of cardiac or respiratory ARREST Do not perform Intubation, CPR, defibrillation or ACLS   In the event of cardiac or respiratory ARREST Use medication by any route, position, wound care, and other measures to relive pain and suffering. May use oxygen, suction and manual treatment of airway obstruction as needed for comfort.      09/28/16 1555    Code Status History    Date Active Date Inactive Code Status Order ID Comments User Context   This patient has a current code status but no historical code status.    Advance Directive Documentation   Flowsheet Row Most Recent Value  Type of Advance Directive  Healthcare Power of Attorney  Pre-existing out of facility DNR order (yellow form or pink MOST form)  No data  "MOST" Form in Place?  No data       Prognosis:   Unable to determine  Discharge Planning:  [place] for rehab with Palliative care service follow-up  Care plan was discussed with patient, RN, SW, care management, and Dr. Bridgett Larsson.   Thank you for allowing the Palliative Medicine Team to assist in the care of this patient.   Time In: 1100 Time Out: 1145 Total Time 81mn Prolonged Time Billed  no       Greater than 50%  of this time was spent counseling and coordinating care related to the above assessment and plan.  MIhor Dow FNP-C Palliative Medicine Team  Phone: [phone]: [phone] Please contact Palliative Medicine Team phone at [phone] questions and concerns.

## 2016-10-01 NOTE — Progress Notes (Signed)
ANTICOAGULATION CONSULT NOTE - Initial Consult  Pharmacy Consult for warfarin Indication: atrial fibrillation  Allergies  Allergen Reactions  . Sulfa Antibiotics Hives    Patient Measurements: Height: 5\' 4"  (162.6 cm) Weight: 227 lb 14.4 oz (103.4 kg) IBW/kg (Calculated) : 54.7  Vital Signs: Temp: 97.8 F (36.6 C) (12/07 1503) Temp Source: Oral (12/07 1503) BP: 132/80 (12/07 1503) Pulse Rate: 112 (12/07 1503)  Labs:  Recent Labs  09/29/16 1554 09/30/16 0508 10/01/16 0430  HGB  --  9.3* 10.2*  HCT  --  29.2* 32.7*  PLT  --  271 266  LABPROT 18.9* 18.6* 21.5*  INR 1.57 1.54 1.84  CREATININE  --  2.05* 1.33*    Estimated Creatinine Clearance: 42.2 mL/min (by C-G formula based on SCr of 1.33 mg/dL (H)).   Medical History: Past Medical History:  Diagnosis Date  . Atrial fibrillation (HCC)   . Empyema [facility](HCC)    in [facility] Aug 2017- stayed in hospital on for 2 months.  . Hypertension     Assessment: Patient admitted with IAI. On warfarin 4mg  daily PTA. Last dose given 12/3  Date INR Warfarin Dose 12/4 1.71 None given 12/5 1.57 4 mg 12/6 1.54 2 mg 12/7 1.84   Goal of Therapy:  INR 2-3   Plan:  INR remains subtherapeutic but has significantly increased from yesterday, most likely attributed to DDI with metronidazole and ciprofloxacin.  Will give reduced dose of warfarin 1 mg PO once this evening. Anticipate INR will increase again tomorrow.  Cindi CarbonMary M Lynlee Stratton, PharmD, BCPS Clinical Pharmacist 10/01/2016,3:29 PM

## 2016-10-01 NOTE — Progress Notes (Signed)
PHARMACY - PHYSICIAN COMMUNICATION CRITICAL VALUE ALERT - BLOOD CULTURE IDENTIFICATION (BCID)  Results for orders placed or performed during the hospital encounter of 09/28/16  Blood Culture ID Panel (Reflexed) (Collected: 09/28/2016 10:45 AM)  Result Value Ref Range   Enterococcus species NOT DETECTED NOT DETECTED   Listeria monocytogenes NOT DETECTED NOT DETECTED   Staphylococcus species DETECTED (A) NOT DETECTED   Staphylococcus aureus NOT DETECTED NOT DETECTED   Methicillin resistance DETECTED (A) NOT DETECTED   Streptococcus species NOT DETECTED NOT DETECTED   Streptococcus agalactiae NOT DETECTED NOT DETECTED   Streptococcus pneumoniae NOT DETECTED NOT DETECTED   Streptococcus pyogenes NOT DETECTED NOT DETECTED   Acinetobacter baumannii NOT DETECTED NOT DETECTED   Enterobacteriaceae species NOT DETECTED NOT DETECTED   Enterobacter cloacae complex NOT DETECTED NOT DETECTED   Escherichia coli NOT DETECTED NOT DETECTED   Klebsiella oxytoca NOT DETECTED NOT DETECTED   Klebsiella pneumoniae NOT DETECTED NOT DETECTED   Proteus species NOT DETECTED NOT DETECTED   Serratia marcescens NOT DETECTED NOT DETECTED   Haemophilus influenzae NOT DETECTED NOT DETECTED   Neisseria meningitidis NOT DETECTED NOT DETECTED   Pseudomonas aeruginosa NOT DETECTED NOT DETECTED   Candida albicans NOT DETECTED NOT DETECTED   Candida glabrata NOT DETECTED NOT DETECTED   Candida krusei NOT DETECTED NOT DETECTED   Candida parapsilosis NOT DETECTED NOT DETECTED   Candida tropicalis NOT DETECTED NOT DETECTED    Name of physician (or Provider) Contacted: Dr. Imogene Burnhen  Changes to prescribed antibiotics required: One time dose of vancomycin was given on 3rd shift for BCID results. Spoke with Dr. Imogene Burnhen - patient improving on cipro and metronidazole. Likely contaminant, will hold off on vancomycin for now.  [name] 10/01/2016  3:25 PM

## 2016-10-01 NOTE — Progress Notes (Signed)
Per MD patient will likely be [facility]. [facility] above. Clinical Social Worker (CSW) will continue to follow and assist as needed.   B[employer] Hughes IncorporatedBailey Sample, LCSW [phone]

## 2016-10-01 NOTE — Progress Notes (Signed)
[facility] [place] [facility]   PATIENT NAME: Asa LenteCatherine Parkinson    MR#:  [id number]  DATE OF BIRTH:  07/05/1941  SUBJECTIVE:  CHIEF COMPLAINT:   Chief Complaint  Patient presents with  . Code Sepsis   Pt is awake, alert and oriented today. She is on oxygen [place] 2 L. Still has poor oral intake. No complaint except weakness. REVIEW OF SYSTEMS:  Review of Systems  Constitutional: Positive for malaise/fatigue. Negative for chills and fever.  HENT: Negative for congestion.   Eyes: Negative for blurred vision and double vision.  Respiratory: Negative for cough, shortness of breath and stridor.   Cardiovascular: Negative for chest pain and leg swelling.  Gastrointestinal: Negative for abdominal pain, blood in stool, constipation, diarrhea, melena, nausea and vomiting.  Genitourinary: Negative for dysuria and hematuria.  Musculoskeletal: Negative for joint pain.  Neurological: Positive for weakness. Negative for dizziness, focal weakness and loss of consciousness.  Psychiatric/Behavioral: Negative for depression. The patient is not nervous/anxious.     DRUG ALLERGIES:   Allergies  Allergen Reactions  . Sulfa Antibiotics Hives   VITALS:  Blood pressure 132/80, pulse (!) 112, temperature 97.8 F (36.6 C), temperature source Oral, resp. rate 20, height 5\' 4"  (1.626 m), weight 227 lb 14.4 oz (103.4 kg), SpO2 98 %. PHYSICAL EXAMINATION:  Physical Exam  Constitutional: She is oriented to person, place, and time and well-developed, well-nourished, and in no distress.  Obese.  HENT:  Head: Normocephalic.  Dry oral mucosa.  Eyes: Conjunctivae and EOM are normal. No scleral icterus.  Neck: Normal range of motion. Neck supple. No JVD present. No tracheal deviation present.  Cardiovascular: Normal rate, regular rhythm and normal heart sounds.  Exam reveals no gallop.   No murmur heard. Pulmonary/Chest: Effort normal. No respiratory distress. She has no wheezes. She  has no rales.  Abdominal: Soft. Bowel sounds are normal. She exhibits no distension. There is no tenderness. There is no rebound and no guarding.  Musculoskeletal: She exhibits no edema or tenderness.  Neurological: She is alert and oriented to person, place, and time. No cranial nerve deficit.  Skin: No rash noted. No erythema.  Psychiatric: Affect normal.   LABORATORY PANEL:   CBC  Recent Labs Lab 10/01/16 0430  WBC 16.0*  HGB 10.2*  HCT 32.7*  PLT 266   ------------------------------------------------------------------------------------------------------------------ Chemistries   Recent Labs Lab 09/28/16 1022  10/01/16 0430  NA 131*  < > 136  K 3.6  < > 3.1*  CL 95*  < > 103  CO2 25  < > 26  GLUCOSE 113*  < > 82  BUN 16  < > 30*  CREATININE 1.95*  < > 1.33*  CALCIUM 8.5*  < > 8.6*  MG  --   --  1.6*  AST 54*  --   --   ALT 10*  --   --   ALKPHOS 137*  --   --   BILITOT 1.2  --   --   < > = values in this interval not displayed. RADIOLOGY:  No results found. ASSESSMENT AND PLAN:   * Septic shock, improved. The patient was treated with antibiotics and IV fluid bolus in the ED. Family later decided palliative care and comfort care. But her daughters want treatment with abx and IVF, resumed home medication. The patient was treated with IV normal saline bolus and continue IV normal saline. Hypotension improved, leukocytosis is improving. continue Cipro and flagyle, follow-up CBC.Marland Kitchen.  C.  Difficile colitis.  PCR positive for C. Difficile toxins.   continue Cipro and flagyl, follow-up CBC.   * Atrial fibrillation with RVR Unable to give Coreg due to low BP. Resume since BP is better. Resumed Coumadin PTD.  * Acute renal failure, due to ATN Secondary to septic shock.   [facility]. Improving, continue IV fluid support and follow-up BMP.  Hyponatremia. Improved with normal saline IV.  * Severe malnutrition Started diet, dietitian  consult.  * Acute respiratory failure with hypoxia  COPD with history of empyema.  Continue O2 [place] 2 L, NEB.  Hypokalemia. Given potassium supplement and follow-up level Hypomagnesemia. Give IV magnesium and a follow-up level.    poor prognosis. Her daughters want treatment and do not want hospice care at this time. The patient will be discharged to skilled nursing facility with palliative care upon discharge. Discussed with palliative care team Megan. All the records are reviewed and case discussed with Care Management/Social Worker. Management plans discussed with the patient, daughters and they are in agreement.  CODE STATUS: DNR  TOTAL CRITICAL TIME TAKING CARE OF THIS PATIENT: 42 minutes.   More than 50% of the time was spent in counseling/coordination of care: YES  POSSIBLE D/C IN 2 DAYS, DEPENDING ON CLINICAL CONDITION.   Shaune Pollackhen, Lee-Anne Flicker M.D on 10/01/2016 at 4:29 PM  Between 7am to 6pm - Pager - [phone]  After 6pm go to [email or web address] - Social research officer, governmentpassword [email or web address] [email or web address]  [facility]  Office  [phone]  CC: Primary care physician; Pcp Not In System  Note: This dictation was prepared with Dragon dictation along with smaller phrase technology. Any transcriptional errors that result from this process are unintentional.

## 2016-10-01 NOTE — Progress Notes (Signed)
Speech Language Pathology Treatment: Dysphagia  Patient Details Name: Mikayla [name] MRN: [id number] DOB: 10/09/1941 Today's Date: 10/01/2016 Time: 1220-1300 SLP Time Calculation (min) (ACUTE ONLY): 40 min  Assessment / Plan / Recommendation Clinical Impression  Pt continues to appear at min increased risk for aspiration w/ oral intake secondary to overall weakness/fatigue d/t medical illness currently as well as d/t increased WOB/SOB w/ exertion during oral intake secondary to declined Pulmonary status overall. Pt has been tolerating bites of puree foods but is eager to try soft solids(Dysphagia level 2). Trials of level 2 diet given(egg salad, cottage cheese, peaches) were provided, and pt was able to tolerate bites/sips of those foods/consistencies w/ no significant oral phase deficits noted(min extra time for full oral clearing) and no immediate, overt s/s of aspiration noted w/ the foods/liquids - a delayed cough x1 b/t trials; allowed for rest breaks b/t trials to reduce exertion and fatigue. Daughters have suggested that [facility] after returning to an oral diet post ST services and swallow evaluations. Pt is able to assist in holding cup for drinking and requires cues(needed) for follow-through.  Pt would benefit from a modified diet of Dysphagia level 2 w/ thin liquids - strict aspiration precautions; pills given in Puree - whole as able to tolerate. Recommend monitoring need for change of liquid consistency to Nectar consistency if increased s/s of aspiration noted w/ thin liquids and Pulmonary decline occurs. This was explained to Daughters present; education discussed on aspiration precautions and diet consistency recommendations. Palliative Care is following w/ pt/Daughters.     HPI HPI: Pt is a 75 y.o. female with a known history of Empyema, hypertension, atrial fibrillation, [facility] until October 2017- for empyema.  Her hospital stay was complicated and she had to stay on ventilator for 15 days with chest tubes for drainage and she was on broad-spectrum antibiotic and also finally had trach and PEG. Since October [facility] and she is there until now. She is given an oral diet and medications at nursing home after passing swallowing tests w/ Speech Therapy there and at [facility](per daughters). Patient's history at admission was mainly obtained from was daughter who was present in the room. Pt was severely hypotensive and her white blood cell count 55,000. Patient was lethargic and not very responsive. She also have some greenish discharge around her PEG tube. As per daughter today, all other family members agreed about her critical condition and they would like to give a trial of antibiotic and IV fluid but no heroic measures, and if she does not improve then they would like to consider Hospice. Currently today, pt is more awake/alert and verbally conversive. She is able to follow basic commands w/ SLP and Daughters(present). Pt would like to eat/drink. Of note, pt is easily SOB/winded w/ any exertion including talking.       SLP Plan  Continue with current plan of care     Recommendations  Diet recommendations: Dysphagia 2 (fine chop);Thin liquid Liquids provided via: Cup;Straw Medication Administration: Whole meds with puree Supervision: Staff to assist with self feeding;Full supervision/cueing for compensatory strategies Compensations: Minimize environmental distractions;Slow rate;Small sips/bites;Lingual sweep for clearance of pocketing;Multiple dry swallows after each bite/sip;Follow solids with liquid Postural Changes and/or Swallow Maneuvers: Seated upright 90 degrees;Upright 30-60 min after meal                General recommendations:  (Dietician f/u )  Oral Care Recommendations: Oral care BID;Staff/trained caregiver to provide oral care Follow up Recommendations:   (TBD) Plan: Continue with current plan of care       GO                 Jerilynn SomKatherine Watson, MS, CCC-SLP Watson,Katherine 10/01/2016, 1:32 PM

## 2016-10-02 LAB — CBC
HEMATOCRIT: 31.9 % — AB (ref 35.0–47.0)
HEMOGLOBIN: 10 g/dL — AB (ref 12.0–16.0)
MCH: 23.5 pg — AB (ref 26.0–34.0)
MCHC: 31.2 g/dL — ABNORMAL LOW (ref 32.0–36.0)
MCV: 75.3 fL — AB (ref 80.0–100.0)
PLATELETS: 291 10*3/uL (ref 150–440)
RBC: 4.24 MIL/uL (ref 3.80–5.20)
RDW: 20.3 % — ABNORMAL HIGH (ref 11.5–14.5)
WBC: 14.3 10*3/uL — ABNORMAL HIGH (ref 3.6–11.0)

## 2016-10-02 LAB — PROTIME-INR
INR: 2.29
PROTHROMBIN TIME: 25.6 s — AB (ref 11.4–15.2)

## 2016-10-02 LAB — BASIC METABOLIC PANEL
Anion gap: 8 (ref 5–15)
BUN: 22 mg/dL — AB (ref 6–20)
CHLORIDE: 105 mmol/L (ref 101–111)
CO2: 25 mmol/L (ref 22–32)
Calcium: 8.5 mg/dL — ABNORMAL LOW (ref 8.9–10.3)
Creatinine, Ser: 1.02 mg/dL — ABNORMAL HIGH (ref 0.44–1.00)
GFR calc Af Amer: >60 mL/min (ref >60)
GFR calc non Af Amer: 52 mL/min — ABNORMAL LOW (ref >60)
GLUCOSE: 86 mg/dL (ref 65–99)
Potassium: 2.8 mmol/L — ABNORMAL LOW (ref 3.5–5.1)
Sodium: 138 mmol/L (ref 135–145)

## 2016-10-02 LAB — CULTURE, BLOOD (ROUTINE X 2)

## 2016-10-02 LAB — MAGNESIUM: Magnesium: 2 mg/dL (ref 1.7–2.4)

## 2016-10-02 MED ORDER — METRONIDAZOLE 500 MG PO TABS
500.0000 mg | ORAL_TABLET | Freq: Three times a day (TID) | ORAL | Status: DC
  Administered 2016-10-02: 500 mg via ORAL
  Filled 2016-10-02: qty 1

## 2016-10-02 MED ORDER — CIPROFLOXACIN HCL 500 MG PO TABS: 500.0000 mg | ORAL_TABLET | Freq: Two times a day (BID) | ORAL | 0 refills | Status: DC

## 2016-10-02 MED ORDER — POTASSIUM CHLORIDE CRYS ER 20 MEQ PO TBCR
40.0000 meq | EXTENDED_RELEASE_TABLET | Freq: Once | ORAL | Status: AC
  Administered 2016-10-02: 40 meq via ORAL
  Filled 2016-10-02: qty 2

## 2016-10-02 MED ORDER — CIPROFLOXACIN HCL 500 MG PO TABS
500.0000 mg | ORAL_TABLET | Freq: Two times a day (BID) | ORAL | Status: DC
  Administered 2016-10-02: 11:00:00 500 mg via ORAL
  Filled 2016-10-02: qty 1

## 2016-10-02 MED ORDER — METRONIDAZOLE 500 MG PO TABS: 500.0000 mg | ORAL_TABLET | Freq: Three times a day (TID) | ORAL | 0 refills | Status: DC

## 2016-10-02 NOTE — Progress Notes (Signed)
ANTICOAGULATION CONSULT NOTE - Initial Consult  Pharmacy Consult for warfarin Indication: atrial fibrillation  Allergies  Allergen Reactions  . Sulfa Antibiotics Hives   Patient Measurements: Height: 5\' 4"  (162.6 cm) Weight: 227 lb 14.4 oz (103.4 kg) IBW/kg (Calculated) : 54.7  Vital Signs: Temp: 98.4 F (36.9 C) (12/08 0447) Temp Source: Oral (12/08 0447) BP: 124/51 (12/08 0447) Pulse Rate: 114 (12/08 0447)  Labs:  Recent Labs  09/30/16 0508 10/01/16 0430 10/02/16 0416  HGB 9.3* 10.2* 10.0*  HCT 29.2* 32.7* 31.9*  PLT 271 266 291  LABPROT 18.6* 21.5* 25.6*  INR 1.54 1.84 2.29  CREATININE 2.05* 1.33* 1.02*    Estimated Creatinine Clearance: 55 mL/min (by C-G formula based on SCr of 1.02 mg/dL (H)).   Medical History: Past Medical History:  Diagnosis Date  . Atrial fibrillation (HCC)   . Empyema [facility](HCC)    in [facility] Aug 2017- stayed in hospital on for 2 months.  . Hypertension     Assessment: Pharmacy consulted to dose and monitor warfarin in this 75 year old female who was taking warfarin prior to admission for atrial fibrillation. Patients home dose is warfarin 4mg  PO daily. INR subtherapeutic on admission.  Dosing history: Date INR Warfarin Dose  Comments/DDI 12/4 1.71 None given   Ciprofloxacin/metronidazole started 12/5 1.57 4 mg    Ciprofloxacin/metronidazole 12/6 1.54 2 mg    Ciprofloxacin/metronidazole 12/7 1.84 1 mg     Ciprofloxacin/metronidazole 12/8 2.29  Goal of Therapy:  INR 2-3   Plan:  INR = 2.29 is therapeutic today, but has significantly increased over the past 2 days despite proactively reducing the warfarin dose in anticipation of drug interaction with both metronidazole and ciprofloxacin. Drug interaction enhances effects of warfarin.   I anticipate that INR will continue to increase tomorrow. Since both antibiotics are still ordered, I will HOLD warfarin dose tonight and recheck INR tomorrow morning.  Cindi CarbonMary M Naureen Benton, PharmD,  BCPS Clinical Pharmacist 10/02/2016,9:07 AM

## 2016-10-02 NOTE — Progress Notes (Signed)
[facility]. Daughters are aware. Bo McclintockBrewer,Norena Bratton S, RN

## 2016-10-02 NOTE — Plan of Care (Signed)
Problem: Health Behavior/Discharge Planning: Goal: Ability to manage health-related needs will improve Outcome: Progressing Per family / ECF

## 2016-10-02 NOTE — Discharge Instructions (Signed)
Palliative care in facility. Fall and aspiration precaution.

## 2016-10-02 NOTE — Progress Notes (Signed)
[facility]. Bo McclintockBrewer,Shelbey Spindler S, RN

## 2016-10-02 NOTE — Plan of Care (Signed)
Problem: SLP Dysphagia Goals Goal: Misc Dysphagia Goal Pt will safely tolerate po diet of least restrictive consistency w/ no overt s/s of aspiration noted by Staff/pt/family x3 sessions.    

## 2016-10-02 NOTE — Progress Notes (Signed)
Patient is medically stable for D[facility] today with palliative following. Per Gavin Poundeborah admissions coordinator at [facility] will [facility] to [facility]. RN will call report to [facility] and arrange EMS for transport. [facility] is aware of above. Clinical Child psychotherapistocial Worker (CSW) sent D/C orders to [facility] [employer]. Patient is aware of above. Patient's daughters Rayfield CitizenCaroline and Rosalita ChessmanSuzanne are at bedside and aware of above. Please reconsult if future social work needs arise. CSW signing off.   [employer]Bailey Izaak Sahr, LCSW [phone]

## 2016-10-02 NOTE — Care Management Important Message (Signed)
Important Message  Patient Details  Name: Mikayla [name] MRN: [id number] Date of Birth: 03/13/1941   Medicare Important Message Given:  Yes    Gwenette GreetBrenda S Elver Stadler, RN 10/02/2016, 10:07 AM

## 2016-10-02 NOTE — Progress Notes (Signed)
Pharmacy Antibiotic Note  Mikayla [name] is a 75 y.o. female admitted on 09/28/2016 with intra-abdominal infection.  Pharmacy has been consulted for ciprofloxacin dosing.  This is day #5 of antibiotics. Patient is also receiving metronidazole 500 mg PO TID for c.difficile infection.   Recommend that metronidazole continue for 10-14 days after ciprofloxacin course is completed.   Plan: Change ciprofloxacin dose to 500 mg PO BID for intra-abdominal infection given improvement in renal function.  Height: 5\' 4"  (162.6 cm) Weight: 227 lb 14.4 oz (103.4 kg) IBW/kg (Calculated) : 54.7  Temp (24hrs), Avg:98.3 F (36.8 C), Min:97.8 F (36.6 C), Max:98.8 F (37.1 C)   Recent Labs Lab 09/28/16 1022 09/28/16 1715 09/30/16 0508 10/01/16 0430 10/02/16 0416  WBC 55.1*  --  25.9* 16.0* 14.3*  CREATININE 1.95*  --  2.05* 1.33* 1.02*  LATICACIDVEN 1.9 1.7  --   --   --     Estimated Creatinine Clearance: 55 mL/min (by C-G formula based on SCr of 1.02 mg/dL (H)).    Allergies  Allergen Reactions  . Sulfa Antibiotics Hives   Thank you for allowing pharmacy to be a part of this patient's care.  Cindi CarbonMary M Brandice Busser, PharmD, BCPS Clinical Pharmacist 10/02/2016 8:55 AM

## 2016-10-02 NOTE — Discharge Summary (Signed)
[facility] - [place] at [facility]   PATIENT NAME: Mikayla [name]    MR#:  [id number]  DATE OF BIRTH:  03-Nov-1940  DATE OF ADMISSION:  09/28/2016   ADMITTING PHYSICIAN: Altamese Dilling, MD  DATE OF DISCHARGE: 10/02/2016 PRIMARY CARE PHYSICIAN: Pcp Not In System   ADMISSION DIAGNOSIS:  Hypoxia [R09.02] C. difficile diarrhea [A04.72] Hypotension, unspecified hypotension type [I95.9] DISCHARGE DIAGNOSIS:  Principal Problem:   Septic shock (HCC) Active Problems:   Diarrhea   Palliative care encounter   Goals of care, counseling/discussion   Encounter for hospice care discussion   Pressure injury of skin  SECONDARY DIAGNOSIS:   Past Medical History:  Diagnosis Date  . Atrial fibrillation (HCC)   . Empyema [facility])    in [facility] Aug 2017- stayed in hospital on for 2 months.  . Hypertension    HOSPITAL COURSE:    * Septic shock, improved. The patient was treated with antibiotics and IV fluid bolus in the ED. Family later decided palliative care and comfort care. But her daughters want treatment with abx and IVF, resumed home medication. The patient was treated with IV normal saline bolus and continue IV normal saline. Hypotension improved, leukocytosis is improving. continue Cipro and flagyl for 10 more days, follow-up CBC..  C. Difficile colitis. PCR positive for C. Difficile toxins.  continue Cipro and flagyl, follow-up CBC.   * Atrial fibrillation with RVR Unable to give Coreg due to low BP. Resumed since BP is better. Resumed Coumadin PTD.  * Acute renal failure, due to ATN Secondary to septic shock. Creatinine was normal 2 months ago at [facility]. Improved with IV fluid support.  Hyponatremia. Improved with normal saline IV.  * Severe malnutrition Started diet, dietitian follow.  * Acute respiratory failure with hypoxia  COPD with history of empyema.  Continue O2 [place] 2 L, NEB.  Hypokalemia. Given potassium supplement, daily  KCl and follow-up level as outpatient. Hypomagnesemia. Give IV magnesium and improved.    poor prognosis. Her daughters want treatment and do not want hospice care at this time. The patient will be discharged to skilled nursing facility with palliative care.  DISCHARGE CONDITIONS:  Stable, discharge to SNF with palliative care today. CONSULTS OBTAINED:   DRUG ALLERGIES:   Allergies  Allergen Reactions  . Sulfa Antibiotics Hives   DISCHARGE MEDICATIONS:     Medication List    STOP taking these medications   amLODipine 10 MG tablet Commonly known as:  NORVASC   furosemide 20 MG tablet Commonly known as:  LASIX     TAKE these medications   acetaminophen 500 MG tablet Commonly known as:  TYLENOL Take 1,000 mg by mouth every 8 (eight) hours as needed for moderate pain or fever.   carbidopa-levodopa 10-100 MG tablet Commonly known as:  SINEMET IR Take 1 tablet by mouth 3 (three) times daily.   carvedilol 12.5 MG tablet Commonly known as:  COREG Take 12.5 mg by mouth 2 (two) times daily with a meal.   ciprofloxacin 500 MG tablet Commonly known as:  CIPRO Take 1 tablet (500 mg total) by mouth 2 (two) times daily. What changed:  medication strength  how much to take  when to take this   gabapentin 300 MG capsule Commonly known as:  NEURONTIN Take 300 mg by mouth 3 (three) times daily.   ipratropium-albuterol 0.5-2.5 (3) MG/3ML Soln Commonly known as:  DUONEB Take 3 mLs by nebulization 3 (three) times daily.   Melatonin 5 MG Tabs Take  5 mg by mouth at bedtime.   metroNIDAZOLE 500 MG tablet Commonly known as:  FLAGYL Take 1 tablet (500 mg total) by mouth every 8 (eight) hours. For 10 days   omeprazole 20 MG tablet Commonly known as:  PRILOSEC OTC Take 20 mg by mouth 2 (two) times daily.   PARoxetine 30 MG tablet Commonly known as:  PAXIL Take 30 mg by mouth daily.   potassium chloride SA 20 MEQ tablet Commonly known as:  K-DUR,KLOR-CON Take 20 mEq  by mouth daily.   sodium chloride 0.65 % Soln nasal spray Commonly known as:  OCEAN Place 1 spray into both nostrils every 2 (two) hours as needed for congestion (allergies).   traZODone 50 MG tablet Commonly known as:  DESYREL Take 25 mg by mouth at bedtime.   warfarin 4 MG tablet Commonly known as:  COUMADIN Take 4 mg by mouth daily.        DISCHARGE INSTRUCTIONS:  See AVS.   If you experience worsening of your admission symptoms, develop shortness of breath, life threatening emergency, suicidal or homicidal thoughts you must seek medical attention immediately by calling 911 or calling your MD immediately  if symptoms less severe.  You Must read complete instructions/literature along with all the possible adverse reactions/side effects for all the Medicines you take and that have been prescribed to you. Take any new Medicines after you have completely understood and accpet all the possible adverse reactions/side effects.   Please note  You were cared for by a hospitalist during your hospital stay. If you have any questions about your discharge medications or the care you received while you were in the hospital after you are discharged, you can call the unit and asked to speak with the hospitalist on call if the hospitalist that took care of you is not available. Once you are discharged, your primary care physician will handle any further medical issues. Please note that NO REFILLS for any discharge medications will be authorized once you are discharged, as it is imperative that you return to your primary care physician (or establish a relationship with a primary care physician if you do not have one) for your aftercare needs so that they can reassess your need for medications and monitor your lab values.    On the day of Discharge:  VITAL SIGNS:  Blood pressure (!) 124/51, pulse 71, temperature 98.4 F (36.9 C), temperature source Oral, resp. rate 16, height 5\' 4"  (1.626 m),  weight 227 lb 14.4 oz (103.4 kg), SpO2 95 %. PHYSICAL EXAMINATION:  GENERAL:  75 y.o.-year-old patient lying in the bed with no acute distress. Obese. EYES: Pupils equal, round, reactive to light and accommodation. No scleral icterus. Extraocular muscles intact.  HEENT: Head atraumatic, normocephalic. Oropharynx and nasopharynx clear.  NECK:  Supple, no jugular venous distention. No thyroid enlargement, no tenderness.  LUNGS: Normal breath sounds bilaterally, no wheezing, rales,rhonchi or crepitation. No use of accessory muscles of respiration.  CARDIOVASCULAR: S1, S2 normal. No murmurs, rubs, or gallops.  ABDOMEN: Soft, non-tender, non-distended. Bowel sounds present. No organomegaly or mass.  EXTREMITIES: No pedal edema, cyanosis, or clubbing.  NEUROLOGIC: Cranial nerves II through XII are intact. Muscle strength 4/5 in all extremities. Sensation intact. Gait not checked.  PSYCHIATRIC: The patient is alert and oriented x 3.  SKIN: No obvious rash, lesion, or ulcer.  DATA REVIEW:   CBC  Recent Labs Lab 10/02/16 0416  WBC 14.3*  HGB 10.0*  HCT 31.9*  PLT 291  Chemistries   Recent Labs Lab 09/28/16 1022  10/02/16 0416  NA 131*  < > 138  K 3.6  < > 2.8*  CL 95*  < > 105  CO2 25  < > 25  GLUCOSE 113*  < > 86  BUN 16  < > 22*  CREATININE 1.95*  < > 1.02*  CALCIUM 8.5*  < > 8.5*  MG  --   < > 2.0  AST 54*  --   --   ALT 10*  --   --   ALKPHOS 137*  --   --   BILITOT 1.2  --   --   < > = values in this interval not displayed.   Microbiology Results  Results for orders placed or performed during the hospital encounter of 09/28/16  Blood Culture (routine x 2)     Status: None (Preliminary result)   Collection Time: 09/28/16 10:22 AM  Result Value Ref Range Status   Specimen Description BLOOD  R ARM  Final   Special Requests   Final    BOTTLES DRAWN AEROBIC AND ANAEROBIC  ANA AER   Culture NO GROWTH 4 DAYS  Final   Report Status PENDING  Incomplete  C  difficile quick scan w PCR reflex     Status: Abnormal   Collection Time: 09/28/16 10:33 AM  Result Value Ref Range Status   C Diff antigen POSITIVE (A) NEGATIVE Final   C Diff toxin NEGATIVE NEGATIVE Final   C Diff interpretation Results are indeterminate. See PCR results.  Final  Gastrointestinal Panel by PCR , Stool     Status: None   Collection Time: 09/28/16 10:33 AM  Result Value Ref Range Status   Campylobacter species NOT DETECTED NOT DETECTED Final   Plesimonas shigelloides NOT DETECTED NOT DETECTED Final   Salmonella species NOT DETECTED NOT DETECTED Final   Yersinia enterocolitica NOT DETECTED NOT DETECTED Final   Vibrio species NOT DETECTED NOT DETECTED Final   Vibrio cholerae NOT DETECTED NOT DETECTED Final   Enteroaggregative E coli (EAEC) NOT DETECTED NOT DETECTED Final   Enteropathogenic E coli (EPEC) NOT DETECTED NOT DETECTED Final   Enterotoxigenic E coli (ETEC) NOT DETECTED NOT DETECTED Final   Shiga like toxin producing E coli (STEC) NOT DETECTED NOT DETECTED Final   Shigella/Enteroinvasive E coli (EIEC) NOT DETECTED NOT DETECTED Final   Cryptosporidium NOT DETECTED NOT DETECTED Final   Cyclospora cayetanensis NOT DETECTED NOT DETECTED Final   Entamoeba histolytica NOT DETECTED NOT DETECTED Final   Giardia lamblia NOT DETECTED NOT DETECTED Final   Adenovirus F40/41 NOT DETECTED NOT DETECTED Final   Astrovirus NOT DETECTED NOT DETECTED Final   Norovirus GI/GII NOT DETECTED NOT DETECTED Final   Rotavirus A NOT DETECTED NOT DETECTED Final   Sapovirus (I, II, IV, and V) NOT DETECTED NOT DETECTED Final  Clostridium Difficile by PCR     Status: Abnormal   Collection Time: 09/28/16 10:33 AM  Result Value Ref Range Status   Toxigenic C Difficile by pcr POSITIVE (A) NEGATIVE Final    Comment: Positive for toxigenic C. difficile with little to no toxin production. Only treat if clinical presentation suggests symptomatic illness.  Blood Culture (routine x 2)     Status:  Abnormal   Collection Time: 09/28/16 10:45 AM  Result Value Ref Range Status   Specimen Description BLOOD RIGHT ANTECUBITAL  Final   Special Requests   Final    BOTTLES DRAWN AEROBIC AND ANAEROBIC  ANA 14ML AER 13ML   Culture  Setup Time   Final    Organism ID to follow GRAM POSITIVE COCCI ANAEROBIC BOTTLE ONLY CRITICAL RESULT CALLED TO, READ BACK BY AND VERIFIED WITH: HANK ZOMPA AT 1505 09/30/16 SDR    Culture (A)  Final    STAPHYLOCOCCUS SPECIES (COAGULASE NEGATIVE) THE SIGNIFICANCE OF ISOLATING THIS ORGANISM FROM A SINGLE SET OF BLOOD CULTURES WHEN MULTIPLE SETS ARE DRAWN IS UNCERTAIN. PLEASE NOTIFY THE MICROBIOLOGY DEPARTMENT WITHIN ONE WEEK IF SPECIATION AND SENSITIVITIES ARE REQUIRED. Performed at [facility] [place]    Report Status 10/02/2016 FINAL  Final  Blood Culture ID Panel (Reflexed)     Status: Abnormal   Collection Time: 09/28/16 10:45 AM  Result Value Ref Range Status   Enterococcus species NOT DETECTED NOT DETECTED Final   Listeria monocytogenes NOT DETECTED NOT DETECTED Final   Staphylococcus species DETECTED (A) NOT DETECTED Final    Comment: CRITICAL RESULT CALLED TO, READ BACK BY AND VERIFIED WITH:  HANK ZOMPA AT 1505 09/30/16 SDR    Staphylococcus aureus NOT DETECTED NOT DETECTED Final   Methicillin resistance DETECTED (A) NOT DETECTED Final    Comment: CRITICAL RESULT CALLED TO, READ BACK BY AND VERIFIED WITH:  HANK ZOMPA AT 1505 09/30/16 SDR    Streptococcus species NOT DETECTED NOT DETECTED Final   Streptococcus agalactiae NOT DETECTED NOT DETECTED Final   Streptococcus pneumoniae NOT DETECTED NOT DETECTED Final   Streptococcus pyogenes NOT DETECTED NOT DETECTED Final   Acinetobacter baumannii NOT DETECTED NOT DETECTED Final   Enterobacteriaceae species NOT DETECTED NOT DETECTED Final   Enterobacter cloacae complex NOT DETECTED NOT DETECTED Final   Escherichia coli NOT DETECTED NOT DETECTED Final   Klebsiella oxytoca NOT DETECTED NOT DETECTED Final    Klebsiella pneumoniae NOT DETECTED NOT DETECTED Final   Proteus species NOT DETECTED NOT DETECTED Final   Serratia marcescens NOT DETECTED NOT DETECTED Final   Haemophilus influenzae NOT DETECTED NOT DETECTED Final   Neisseria meningitidis NOT DETECTED NOT DETECTED Final   Pseudomonas aeruginosa NOT DETECTED NOT DETECTED Final   Candida albicans NOT DETECTED NOT DETECTED Final   Candida glabrata NOT DETECTED NOT DETECTED Final   Candida krusei NOT DETECTED NOT DETECTED Final   Candida parapsilosis NOT DETECTED NOT DETECTED Final   Candida tropicalis NOT DETECTED NOT DETECTED Final  MRSA PCR Screening     Status: None   Collection Time: 09/28/16 10:10 PM  Result Value Ref Range Status   MRSA by PCR NEGATIVE NEGATIVE Final    Comment:        The GeneXpert MRSA Assay (FDA approved for NASAL specimens only), is one component of a comprehensive MRSA colonization surveillance program. It is not intended to diagnose MRSA infection nor to guide or monitor treatment for MRSA infections.     RADIOLOGY:  No results found.   Management plans discussed with the patient, daughters and they are in agreement.  CODE STATUS:     Code Status Orders        Start     Ordered   09/28/16 1556  Do not attempt resuscitation (DNR)  Continuous    Question Answer Comment  In the event of cardiac or respiratory ARREST Do not call a "code blue"   In the event of cardiac or respiratory ARREST Do not perform Intubation, CPR, defibrillation or ACLS   In the event of cardiac or respiratory ARREST Use medication by any route, position, wound care, and other measures to relive pain  and suffering. May use oxygen, suction and manual treatment of airway obstruction as needed for comfort.      09/28/16 1555    Code Status History    Date Active Date Inactive Code Status Order ID Comments User Context   This patient has a current code status but no historical code status.    Advance Directive  Documentation   Flowsheet Row Most Recent Value  Type of Advance Directive  Healthcare Power of Attorney  Pre-existing out of facility DNR order (yellow form or pink MOST form)  No data  "MOST" Form in Place?  No data      TOTAL TIME TAKING CARE OF THIS PATIENT: 37 minutes.    Shaune Pollackhen, Rileigh Kawashima M.D on 10/02/2016 at 10:48 AM  Between 7am to 6pm - Pager - [phone]  After 6pm go to [email or web address] - Social research officer, governmentpassword [email or web address] [email or web address]  [facility] [place] [facility]  Office  [phone]  CC: Primary care physician; Pcp Not In System   Note: This dictation was prepared with Dragon dictation along with smaller phrase technology. Any transcriptional errors that result from this process are unintentional.

## 2016-10-02 NOTE — Progress Notes (Signed)
[facility] Awaiting transportation. Bo McclintockBrewer,Jasiel Apachito S, RN

## 2016-10-02 NOTE — Progress Notes (Addendum)
Changed foam dressings to coccyx and to left hip. I believe the documentation for pressure injury to right hip is for left.  No pressure injury noted on right hip. Coccyx appears more like a deep tissue injury than  MASD .  Barrier cream applied earlier and fresh foam dressing placed now. Pt has increased edema to arms and generalized +2 pitting edema.  Heels are blanchable but red.  Applied foam heel protectors and elevated on a pillow so heels float in air.  Pt with very stiff legs and difficult to position pt so she is comfortable.  Henriette CombsSarah Luane Rochon RN

## 2016-10-03 LAB — CULTURE, BLOOD (ROUTINE X 2): CULTURE: NO GROWTH

## 2016-10-23 ENCOUNTER — Encounter: Payer: Self-pay | Admitting: Emergency Medicine

## 2016-10-23 ENCOUNTER — Inpatient Hospital Stay
Admission: EM | Admit: 2016-10-23 | Discharge: 2016-10-30 | DRG: 871 | Disposition: A | Discharge status: Skilled Nursing Facility | Payer: Medicare Other | Attending: Internal Medicine | Admitting: Internal Medicine

## 2016-10-23 ENCOUNTER — Emergency Department: Payer: Medicare Other

## 2016-10-23 DIAGNOSIS — T148XXA Other injury of unspecified body region, initial encounter: Secondary | ICD-10-CM

## 2016-10-23 DIAGNOSIS — J9601 Acute respiratory failure with hypoxia: Secondary | ICD-10-CM | POA: Diagnosis present

## 2016-10-23 DIAGNOSIS — L089 Local infection of the skin and subcutaneous tissue, unspecified: Secondary | ICD-10-CM

## 2016-10-23 DIAGNOSIS — Z452 Encounter for adjustment and management of vascular access device: Secondary | ICD-10-CM

## 2016-10-23 DIAGNOSIS — E876 Hypokalemia: Secondary | ICD-10-CM | POA: Diagnosis present

## 2016-10-23 DIAGNOSIS — A4159 Other Gram-negative sepsis: Principal | ICD-10-CM | POA: Diagnosis present

## 2016-10-23 DIAGNOSIS — A419 Sepsis, unspecified organism: Secondary | ICD-10-CM | POA: Diagnosis present

## 2016-10-23 DIAGNOSIS — I959 Hypotension, unspecified: Secondary | ICD-10-CM | POA: Diagnosis present

## 2016-10-23 DIAGNOSIS — T814XXA Infection following a procedure, initial encounter: Secondary | ICD-10-CM | POA: Diagnosis present

## 2016-10-23 DIAGNOSIS — N390 Urinary tract infection, site not specified: Secondary | ICD-10-CM | POA: Diagnosis present

## 2016-10-23 DIAGNOSIS — I480 Paroxysmal atrial fibrillation: Secondary | ICD-10-CM | POA: Diagnosis present

## 2016-10-23 DIAGNOSIS — M1990 Unspecified osteoarthritis, unspecified site: Secondary | ICD-10-CM | POA: Diagnosis present

## 2016-10-23 DIAGNOSIS — E871 Hypo-osmolality and hyponatremia: Secondary | ICD-10-CM | POA: Diagnosis present

## 2016-10-23 DIAGNOSIS — I110 Hypertensive heart disease with heart failure: Secondary | ICD-10-CM | POA: Diagnosis present

## 2016-10-23 DIAGNOSIS — Z66 Do not resuscitate: Secondary | ICD-10-CM | POA: Diagnosis present

## 2016-10-23 DIAGNOSIS — I5023 Acute on chronic systolic (congestive) heart failure: Secondary | ICD-10-CM | POA: Diagnosis present

## 2016-10-23 DIAGNOSIS — Z8249 Family history of ischemic heart disease and other diseases of the circulatory system: Secondary | ICD-10-CM

## 2016-10-23 DIAGNOSIS — D638 Anemia in other chronic diseases classified elsewhere: Secondary | ICD-10-CM | POA: Diagnosis present

## 2016-10-23 DIAGNOSIS — Z87891 Personal history of nicotine dependence: Secondary | ICD-10-CM

## 2016-10-23 DIAGNOSIS — G20 Parkinson's disease: Secondary | ICD-10-CM | POA: Diagnosis present

## 2016-10-23 DIAGNOSIS — Z6838 Body mass index (BMI) 38.0-38.9, adult: Secondary | ICD-10-CM | POA: Diagnosis not present

## 2016-10-23 DIAGNOSIS — Z881 Allergy status to other antibiotic agents status: Secondary | ICD-10-CM | POA: Diagnosis not present

## 2016-10-23 DIAGNOSIS — Z79899 Other long term (current) drug therapy: Secondary | ICD-10-CM | POA: Diagnosis not present

## 2016-10-23 DIAGNOSIS — M6281 Muscle weakness (generalized): Secondary | ICD-10-CM

## 2016-10-23 DIAGNOSIS — B961 Klebsiella pneumoniae [K. pneumoniae] as the cause of diseases classified elsewhere: Secondary | ICD-10-CM | POA: Diagnosis present

## 2016-10-23 DIAGNOSIS — L89154 Pressure ulcer of sacral region, stage 4: Secondary | ICD-10-CM | POA: Diagnosis present

## 2016-10-23 DIAGNOSIS — Z882 Allergy status to sulfonamides status: Secondary | ICD-10-CM

## 2016-10-23 LAB — COMPREHENSIVE METABOLIC PANEL
ALBUMIN: 2.3 g/dL — AB (ref 3.5–5.0)
ALT: <5 U/L — ABNORMAL LOW (ref 14–54)
AST: 14 U/L — AB (ref 15–41)
Alkaline Phosphatase: 62 U/L (ref 38–126)
Anion gap: 8 (ref 5–15)
BILIRUBIN TOTAL: 0.6 mg/dL (ref 0.3–1.2)
BUN: 8 mg/dL (ref 6–20)
CHLORIDE: 97 mmol/L — AB (ref 101–111)
CO2: 29 mmol/L (ref 22–32)
Calcium: 8.6 mg/dL — ABNORMAL LOW (ref 8.9–10.3)
Creatinine, Ser: 0.7 mg/dL (ref 0.44–1.00)
GFR calc Af Amer: >60 mL/min (ref >60)
GFR calc non Af Amer: >60 mL/min (ref >60)
GLUCOSE: 90 mg/dL (ref 65–99)
POTASSIUM: 3.4 mmol/L — AB (ref 3.5–5.1)
Sodium: 134 mmol/L — ABNORMAL LOW (ref 135–145)
Total Protein: 6.6 g/dL (ref 6.5–8.1)

## 2016-10-23 LAB — LACTIC ACID, PLASMA: Lactic Acid, Venous: 1.2 mmol/L (ref 0.5–1.9)

## 2016-10-23 LAB — URINALYSIS, COMPLETE (UACMP) WITH MICROSCOPIC
Bilirubin Urine: NEGATIVE
Glucose, UA: NEGATIVE mg/dL
KETONES UR: NEGATIVE mg/dL
Nitrite: NEGATIVE
PH: 6 (ref 5.0–8.0)
Protein, ur: NEGATIVE mg/dL
SPECIFIC GRAVITY, URINE: 1.008 (ref 1.005–1.030)

## 2016-10-23 LAB — CBC WITH DIFFERENTIAL/PLATELET
Basophils Absolute: 0.1 10*3/uL (ref 0–0.1)
Basophils Relative: 1 %
EOS PCT: 1 %
Eosinophils Absolute: 0.1 10*3/uL (ref 0–0.7)
HCT: 30 % — ABNORMAL LOW (ref 35.0–47.0)
Hemoglobin: 9.4 g/dL — ABNORMAL LOW (ref 12.0–16.0)
LYMPHS ABS: 2.3 10*3/uL (ref 1.0–3.6)
LYMPHS PCT: 14 %
MCH: 22.5 pg — AB (ref 26.0–34.0)
MCHC: 31.5 g/dL — ABNORMAL LOW (ref 32.0–36.0)
MCV: 71.3 fL — AB (ref 80.0–100.0)
MONO ABS: 1.2 10*3/uL — AB (ref 0.2–0.9)
MONOS PCT: 7 %
Neutro Abs: 12.4 10*3/uL — ABNORMAL HIGH (ref 1.4–6.5)
Neutrophils Relative %: 77 %
PLATELETS: 509 10*3/uL — AB (ref 150–440)
RBC: 4.2 MIL/uL (ref 3.80–5.20)
RDW: 21.5 % — AB (ref 11.5–14.5)
WBC: 16.1 10*3/uL — ABNORMAL HIGH (ref 3.6–11.0)

## 2016-10-23 LAB — PROTIME-INR
INR: 1.97
Prothrombin Time: 22.7 seconds — ABNORMAL HIGH (ref 11.4–15.2)

## 2016-10-23 LAB — APTT: APTT: 46 s — AB (ref 24–36)

## 2016-10-23 LAB — TROPONIN I

## 2016-10-23 LAB — LIPASE, BLOOD: Lipase: <10 U/L — ABNORMAL LOW (ref 11–51)

## 2016-10-23 MED ORDER — SODIUM CHLORIDE 0.9 % IV BOLUS (SEPSIS): 1000.0000 mL | Freq: Once | INTRAVENOUS | Status: DC

## 2016-10-23 MED ORDER — PIPERACILLIN-TAZOBACTAM 3.375 G IVPB
3.3750 g | Freq: Three times a day (TID) | INTRAVENOUS | Status: DC
  Administered 2016-10-23 – 2016-10-26 (×9): 3.375 g via INTRAVENOUS
  Filled 2016-10-23 (×8): qty 50

## 2016-10-23 MED ORDER — SODIUM CHLORIDE 0.9 % IV BOLUS (SEPSIS)
500.0000 mL | Freq: Once | INTRAVENOUS | Status: AC
  Administered 2016-10-23: 500 mL via INTRAVENOUS

## 2016-10-23 MED ORDER — VANCOMYCIN HCL 10 G IV SOLR
1500.0000 mg | Freq: Two times a day (BID) | INTRAVENOUS | Status: DC
  Administered 2016-10-24 – 2016-10-25 (×3): 1500 mg via INTRAVENOUS
  Filled 2016-10-23 (×5): qty 1500

## 2016-10-23 MED ORDER — SODIUM CHLORIDE 0.9 % IV SOLN
INTRAVENOUS | Status: DC
  Administered 2016-10-23: 23:00:00 via INTRAVENOUS

## 2016-10-23 MED ORDER — METOPROLOL TARTRATE 25 MG PO TABS: 12.5000 mg | ORAL_TABLET | Freq: Once | ORAL | Status: DC

## 2016-10-23 MED ORDER — ACETAMINOPHEN 650 MG RE SUPP: 650.0000 mg | Freq: Four times a day (QID) | RECTAL | Status: DC | PRN

## 2016-10-23 MED ORDER — VANCOMYCIN HCL IN DEXTROSE 1-5 GM/200ML-% IV SOLN
1000.0000 mg | Freq: Once | INTRAVENOUS | Status: AC
  Administered 2016-10-23: 1000 mg via INTRAVENOUS
  Filled 2016-10-23: qty 200

## 2016-10-23 MED ORDER — ACETAMINOPHEN 325 MG PO TABS
650.0000 mg | ORAL_TABLET | Freq: Four times a day (QID) | ORAL | Status: DC | PRN
  Administered 2016-10-23: 650 mg via ORAL
  Filled 2016-10-23: qty 2

## 2016-10-23 MED ORDER — PIPERACILLIN-TAZOBACTAM 3.375 G IVPB 30 MIN
3.3750 g | Freq: Once | INTRAVENOUS | Status: AC
  Administered 2016-10-23: 3.375 g via INTRAVENOUS
  Filled 2016-10-23: qty 50

## 2016-10-23 MED ORDER — SODIUM CHLORIDE 0.9 % IV BOLUS (SEPSIS)
1000.0000 mL | Freq: Once | INTRAVENOUS | Status: AC
  Administered 2016-10-23: 1000 mL via INTRAVENOUS

## 2016-10-23 MED ORDER — PIPERACILLIN-TAZOBACTAM 3.375 G IVPB
INTRAVENOUS | Status: AC
  Administered 2016-10-23: 3.375 g via INTRAVENOUS
  Filled 2016-10-23: qty 50

## 2016-10-23 NOTE — ED Triage Notes (Addendum)
[facility] Patient had elevated WBC count yesterday with routine blood work. Facility MD wanted patient evaluated for possible sepsis. Patient denies pain. Denies nausea and vomiting. Facility reports wound to patient's upper back. Facility reports patient has had a low grade fever. Patient is afebrile upon arrival to ED.

## 2016-10-23 NOTE — H&P (Signed)
[facility] - [place] at [facility]   PATIENT NAME: Mikayla [name]    MR#:  [id number]  DATE OF BIRTH:  07/26/1941  DATE OF ADMISSION:  10/23/2016  PRIMARY CARE PHYSICIAN: Pcp Not In System   REQUESTING/REFERRING PHYSICIAN: Loleta Roseory Forbach, MD  CHIEF COMPLAINT:   Chief Complaint  Patient presents with  . Abnormal Lab   Leukocytosis and tachycardia. HISTORY OF PRESENT ILLNESS:  Mikayla [name]  is a 75 y.o. female with a known history of A. fib, empyema, hypertension and C. difficile colitis. The patient was sent from nursing home to the ED due to above chief complaints. The patient denies any symptoms, but she is not good historian. She was found leukocytosis and A. fib with RVR. Urinalysis show UTI. In addition, she has surgical wound infection with discharge on the right side of the back. Wound culture was sent. She is being treated with vancomycin and Zosyn in the ED.  PAST MEDICAL HISTORY:   Past Medical History:  Diagnosis Date  . Atrial fibrillation (HCC)   . Empyema [facility](HCC)    in [facility] Aug 2017- stayed in hospital on for 2 months.  . Hypertension     PAST SURGICAL HISTORY:  History reviewed. No pertinent surgical history.  SOCIAL HISTORY:   Social History  Substance Use Topics  . Smoking status: Former Smoker    Types: Cigarettes  . Smokeless tobacco: Former NeurosurgeonUser     Comment: quit in 2001. smoked for 30 years  . Alcohol use No    FAMILY HISTORY:   Family History  Problem Relation Age of Onset  . Hypertension Mother     DRUG ALLERGIES:   Allergies  Allergen Reactions  . Sulfa Antibiotics Hives    REVIEW OF SYSTEMS:   Review of Systems  Constitutional: Positive for malaise/fatigue. Negative for chills and fever.  HENT: Negative for congestion and hearing loss.   Eyes: Negative for blurred vision and double vision.  Respiratory: Negative for cough, shortness of breath and stridor.   Cardiovascular: Negative for chest pain and  leg swelling.  Gastrointestinal: Negative for abdominal pain, blood in stool, diarrhea, melena, nausea and vomiting.  Genitourinary: Negative for dysuria, flank pain and hematuria.  Musculoskeletal: Negative for back pain and joint pain.  Skin: Negative for itching and rash.  Neurological: Negative for dizziness, focal weakness, loss of consciousness and weakness.  Psychiatric/Behavioral: Negative for depression. The patient is not nervous/anxious.     MEDICATIONS AT HOME:   Prior to Admission medications   Medication Sig Start Date End Date Taking? Authorizing Provider  acetaminophen (TYLENOL) 500 MG tablet Take 1,000 mg by mouth every 8 (eight) hours as needed for moderate pain or fever.   Yes Historical Provider, MD  amLODipine (NORVASC) 10 MG tablet Take 10 mg by mouth daily.   Yes Historical Provider, MD  carbidopa-levodopa (SINEMET IR) 10-100 MG tablet Take 1 tablet by mouth 3 (three) times daily.   Yes Historical Provider, MD  carvedilol (COREG) 12.5 MG tablet Take 12.5 mg by mouth 2 (two) times daily with a meal.   Yes Historical Provider, MD  celecoxib (CELEBREX) 100 MG capsule Take 100 mg by mouth 2 (two) times daily.   Yes Historical Provider, MD  furosemide (LASIX) 20 MG tablet Take 20 mg by mouth.   Yes Historical Provider, MD  gabapentin (NEURONTIN) 300 MG capsule Take 300 mg by mouth 3 (three) times daily.   Yes Historical Provider, MD  ipratropium-albuterol (DUONEB) 0.5-2.5 (3) MG/3ML SOLN  Take 3 mLs by nebulization 3 (three) times daily.   Yes Historical Provider, MD  Melatonin 5 MG TABS Take 5 mg by mouth at bedtime.   Yes Historical Provider, MD  omeprazole (PRILOSEC OTC) 20 MG tablet Take 20 mg by mouth 2 (two) times daily.   Yes Historical Provider, MD  omeprazole (PRILOSEC) 20 MG capsule Take 20 mg by mouth 2 (two) times daily before a meal.   Yes Historical Provider, MD  PARoxetine (PAXIL) 30 MG tablet Take 30 mg by mouth daily.    Yes Historical Provider, MD    potassium chloride SA (K-DUR,KLOR-CON) 20 MEQ tablet Take 20 mEq by mouth daily.   Yes Historical Provider, MD  sodium chloride (OCEAN) 0.65 % SOLN nasal spray Place 1 spray into both nostrils every 2 (two) hours as needed for congestion (allergies).   Yes Historical Provider, MD  traZODone (DESYREL) 50 MG tablet Take 25 mg by mouth at bedtime.   Yes Historical Provider, MD  warfarin (COUMADIN) 4 MG tablet Take 4 mg by mouth daily.   Yes Historical Provider, MD  ciprofloxacin (CIPRO) 500 MG tablet Take 1 tablet (500 mg total) by mouth 2 (two) times daily. Patient not taking: Reported on 10/23/2016 10/02/16   Shaune PollackQing Kroy Sprung, MD  metroNIDAZOLE (FLAGYL) 500 MG tablet Take 1 tablet (500 mg total) by mouth every 8 (eight) hours. For 10 days Patient not taking: Reported on 10/23/2016 10/02/16   Shaune PollackQing Darlette Dubow, MD      VITAL SIGNS:  Blood pressure 117/90, pulse (!) 127, temperature 98.2 F (36.8 C), temperature source Oral, resp. rate (!) 21, height 5\' 4"  (1.626 m), weight 236 lb (107 kg), SpO2 100 %.  PHYSICAL EXAMINATION:  Physical Exam  GENERAL:  75 y.o.-year-old patient lying in the bed with no acute distress. Morbidly obese. EYES: Pupils equal, round, reactive to light and accommodation. No scleral icterus. Extraocular muscles intact.  HEENT: Head atraumatic, normocephalic. Oropharynx and nasopharynx clear.  NECK:  Supple, no jugular venous distention. No thyroid enlargement, no tenderness.  LUNGS: Normal breath sounds bilaterally, no wheezing, rales,rhonchi or crepitation. No use of accessory muscles of respiration. Surgical wound with some discharge on the right side of the back. CARDIOVASCULAR: S1, S2 normal. No murmurs, rubs, or gallops.  ABDOMEN: Soft, nontender, nondistended. Bowel sounds present. No organomegaly or mass.  EXTREMITIES: No pedal edema, cyanosis, or clubbing.  NEUROLOGIC: Cranial nerves II through XII are intact. Muscle strength 3/5 in all extremities. Sensation intact. Gait not  checked.  PSYCHIATRIC: The patient is alert and oriented x 2.  SKIN: No obvious rash, lesion, or ulcer.   LABORATORY PANEL:   CBC  Recent Labs Lab 10/23/16 1550  WBC 16.1*  HGB 9.4*  HCT 30.0*  PLT 509*   ------------------------------------------------------------------------------------------------------------------  Chemistries   Recent Labs Lab 10/23/16 1550  NA 134*  K 3.4*  CL 97*  CO2 29  GLUCOSE 90  BUN 8  CREATININE 0.70  CALCIUM 8.6*  AST 14*  ALT <5*  ALKPHOS 62  BILITOT 0.6   ------------------------------------------------------------------------------------------------------------------  Cardiac Enzymes  Recent Labs Lab 10/23/16 1550  TROPONINI <0.03   ------------------------------------------------------------------------------------------------------------------  RADIOLOGY:  Dg Chest Port 1 View  Result Date: 10/23/2016 CLINICAL DATA:  Elevated white blood count. EXAM: PORTABLE CHEST 1 VIEW COMPARISON:  Radiograph of September 28, 2016. FINDINGS: Stable cardiomediastinal silhouette. No pneumothorax is noted. Left lung is clear. Atherosclerosis of thoracic aorta is noted. Moderately displaced right rib fractures are again noted. Stable right basilar opacity is noted  consistent with atelectasis or scarring. Blunting of right costophrenic sulcus is noted most consistent with scarring. IMPRESSION: Stable moderately displaced right rib fractures. Aortic atherosclerosis. Stable right basilar atelectasis or scarring. Electronically Signed   By: Lupita Raider, M.D.   On: 10/23/2016 16:58      IMPRESSION AND PLAN:   Sepsis with UTI and back wound infection The patient will be admitted to medical floor. Started vancomycin and Zosyn, pharmacy to dose. IV fluids to support, Follow-up CBC and cultures. Wound care.  A. fib with RVR due to sepsis. Continue Coreg and Coumadin and pharmacy to dose.   Hypokalemia. Give potassium supplement, follow-up  potassium and magnesium level.  Hyponatremia. Give normal saline IV and follow-up BMP.  All the records are reviewed and case discussed with ED provider. Management plans discussed with the patient, her 2 daughters and they are in agreement.  CODE STATUS: DO NOT RESUSCITATE  TOTAL TIME TAKING CARE OF THIS PATIENT: 56 minutes.    Shaune Pollack M.D on 10/23/2016 at 6:20 PM  Between 7am to 6pm - Pager - [phone]  After 6pm go to [email or web address] - Social research officer, government  [facility] [place] [facility]  Office  [phone]  CC: Primary care physician; Pcp Not In System   Note: This dictation was prepared with Dragon dictation along with smaller phrase technology. Any transcriptional errors that result from this process are unintentional.

## 2016-10-23 NOTE — Progress Notes (Signed)
Pharmacy Antibiotic Note  Mikayla JohnsCatherine R Guidone is a 75 y.o. female admitted on 10/23/2016 with sepsis.  Pharmacy has been consulted for vancomycin/Zosyn dosing.  Plan: Will initiated Vancomycin 1000 mg IV x 1 then Vancomycin 1500mg  IV Q12h after 1st dose per stacked dose protocol. Will order Vancomycin trough level prior to the fifth dose.   Ke=0.072 T1/2= 9.63 VD=74.9 Stacked=6hrs  Zosyn 3.375g IV q8h (4 hour infusion).  Height: 5\' 4"  (162.6 cm) Weight: 236 lb (107 kg) IBW/kg (Calculated) : 54.7  Temp (24hrs), Avg:98.2 F (36.8 C), Min:98.2 F (36.8 C), Max:98.2 F (36.8 C)   Recent Labs Lab 10/23/16 1542 10/23/16 1550  WBC  --  16.1*  CREATININE  --  0.70  LATICACIDVEN 1.2  --     Estimated Creatinine Clearance: 71.4 mL/min (by C-G formula based on SCr of 0.7 mg/dL).    Allergies  Allergen Reactions  . Sulfa Antibiotics Hives    Antimicrobials this admission: Vancomycin 12/29>> Zosyn 12/29 >>  Microbiology results: 12/29 BCx: in process 12/29 UCx: in process 12/29 Aerobic Cx: in process  12/29 UA: + leuks and bacteria   Thank you for allowing pharmacy to be a part of this patient's care.  Delsa BernKelly m Tyniah Kastens, PharmD 10/23/2016 6:40 PM

## 2016-10-23 NOTE — ED Provider Notes (Signed)
[facility] Emergency Department Provider Note  ____________________________________________   First MD Initiated Contact with Patient 10/23/16 1556     (approximate)  I have reviewed the triage vital signs and the nursing notes.   HISTORY  Chief Complaint Abnormal Lab  Level V caveat: The patient is critically ill and appears acutely altered and is unable to provide a reliable history or review of systems  HPI Mikayla [name] is a 75 y.o. female with extensive chronic medical issues and multiple prior admissions for sepsis of various sources including both urinary tract infections and C. difficile who presents for evaluation of probable sepsis.  She has been running a fever at her facility and has not had an elevated heart rate.  Reportedly she had some lab work done that showed an elevated white blood cell count.  She was sent for further evaluation.   No additional recent history is known at this time except that she has a wound on the right upper part of her back that reportedly looked like it might be getting infected.     Past Medical History:  Diagnosis Date  . Atrial fibrillation (HCC)   . Empyema [facility](HCC)    in [facility] Aug 2017- stayed in hospital on for 2 months.  . Hypertension     Patient Active Problem List   Diagnosis Date Noted  . Sepsis (HCC) 10/23/2016  . Pressure injury of skin 09/30/2016  . Palliative care encounter   . Goals of care, counseling/discussion   . Encounter for hospice care discussion   . Septic shock (HCC) 09/28/2016  . Diarrhea 09/28/2016    History reviewed. No pertinent surgical history.  Prior to Admission medications   Medication Sig Start Date End Date Taking? Authorizing Provider  acetaminophen (TYLENOL) 500 MG tablet Take 1,000 mg by mouth every 8 (eight) hours as needed for moderate pain or fever.   Yes Historical Provider, MD  amLODipine (NORVASC) 10 MG tablet Take 10 mg by mouth daily.   Yes Historical  Provider, MD  carbidopa-levodopa (SINEMET IR) 10-100 MG tablet Take 1 tablet by mouth 3 (three) times daily.   Yes Historical Provider, MD  carvedilol (COREG) 12.5 MG tablet Take 12.5 mg by mouth 2 (two) times daily with a meal.   Yes Historical Provider, MD  celecoxib (CELEBREX) 100 MG capsule Take 100 mg by mouth 2 (two) times daily.   Yes Historical Provider, MD  furosemide (LASIX) 20 MG tablet Take 20 mg by mouth.   Yes Historical Provider, MD  gabapentin (NEURONTIN) 300 MG capsule Take 300 mg by mouth 3 (three) times daily.   Yes Historical Provider, MD  ipratropium-albuterol (DUONEB) 0.5-2.5 (3) MG/3ML SOLN Take 3 mLs by nebulization 3 (three) times daily.   Yes Historical Provider, MD  Melatonin 5 MG TABS Take 5 mg by mouth at bedtime.   Yes Historical Provider, MD  omeprazole (PRILOSEC OTC) 20 MG tablet Take 20 mg by mouth 2 (two) times daily.   Yes Historical Provider, MD  omeprazole (PRILOSEC) 20 MG capsule Take 20 mg by mouth 2 (two) times daily before a meal.   Yes Historical Provider, MD  PARoxetine (PAXIL) 30 MG tablet Take 30 mg by mouth daily.    Yes Historical Provider, MD  potassium chloride SA (K-DUR,KLOR-CON) 20 MEQ tablet Take 20 mEq by mouth daily.   Yes Historical Provider, MD  sodium chloride (OCEAN) 0.65 % SOLN nasal spray Place 1 spray into both nostrils every 2 (two) hours as  needed for congestion (allergies).   Yes Historical Provider, MD  traZODone (DESYREL) 50 MG tablet Take 25 mg by mouth at bedtime.   Yes Historical Provider, MD  warfarin (COUMADIN) 4 MG tablet Take 4 mg by mouth daily.   Yes Historical Provider, MD  ciprofloxacin (CIPRO) 500 MG tablet Take 1 tablet (500 mg total) by mouth 2 (two) times daily. Patient not taking: Reported on 10/23/2016 10/02/16   Shaune PollackQing Chen, MD  metroNIDAZOLE (FLAGYL) 500 MG tablet Take 1 tablet (500 mg total) by mouth every 8 (eight) hours. For 10 days Patient not taking: Reported on 10/23/2016 10/02/16   Shaune PollackQing Chen, MD     Allergies Sulfa antibiotics  Family History  Problem Relation Age of Onset  . Hypertension Mother     Social History Social History  Substance Use Topics  . Smoking status: Former Smoker    Types: Cigarettes  . Smokeless tobacco: Former NeurosurgeonUser     Comment: quit in 2001. smoked for 30 years  . Alcohol use No    Review of Systems Level V caveat: The patient is critically ill and appears acutely altered and is unable to provide a reliable history or review of systems  ____________________________________________   PHYSICAL EXAM:  VITAL SIGNS: ED Triage Vitals [10/23/16 1543]  Enc Vitals Group     BP (!) 131/95     Pulse Rate (!) 132     Resp 18     Temp 98.2 F (36.8 C)     Temp Source Oral     SpO2 100 %     Weight 236 lb (107 kg)     Height 5\' 4"  (1.626 m)     Head Circumference      Peak Flow      Pain Score      Pain Loc      Pain Edu?      Excl. in GC?     Constitutional: Alert, Chronically debilitated, moderate tachycardia Eyes: Conjunctivae are normal. PERRL. EOMI. Head: Atraumatic. Nose: No congestion/rhinnorhea. Mouth/Throat: Mucous membranes are dry.  Oropharynx non-erythematous. Neck: No stridor.  No meningeal signs.   Cardiovascular: Tachycardia in the 130s, regular rhythm. Good peripheral circulation. Grossly normal heart sounds. Respiratory: Normal respiratory effort.  No retractions. Lungs CTAB. Gastrointestinal: Morbidly obese.  Soft and nontender. No distention.  Musculoskeletal: No lower extremity tenderness nor edema. No gross deformities of extremities. Neurologic:  Normal speech and language. No gross focal neurologic deficits are appreciated.  Skin:  Skin is warm, dry and intact. Open wound on right posterior shoulder with a bandage dated yesterday.  Purulence is dripping from underneath the dressing.  When removed, purulence drains copiously from the wound.  Culture swap obtained.   ____________________________________________    LABS (all labs ordered are listed, but only abnormal results are displayed)  Labs Reviewed  COMPREHENSIVE METABOLIC PANEL - Abnormal; Notable for the following:       Result Value   Sodium 134 (*)    Potassium 3.4 (*)    Chloride 97 (*)    Calcium 8.6 (*)    Albumin 2.3 (*)    AST 14 (*)    ALT <5 (*)    All other components within normal limits  CBC WITH DIFFERENTIAL/PLATELET - Abnormal; Notable for the following:    WBC 16.1 (*)    Hemoglobin 9.4 (*)    HCT 30.0 (*)    MCV 71.3 (*)    MCH 22.5 (*)    MCHC 31.5 (*)  RDW 21.5 (*)    Platelets 509 (*)    Neutro Abs 12.4 (*)    Monocytes Absolute 1.2 (*)    All other components within normal limits  URINALYSIS, COMPLETE (UACMP) WITH MICROSCOPIC - Abnormal; Notable for the following:    Color, Urine YELLOW (*)    APPearance HAZY (*)    Hgb urine dipstick MODERATE (*)    Leukocytes, UA SMALL (*)    Bacteria, UA RARE (*)    Squamous Epithelial / LPF 0-5 (*)    All other components within normal limits  LIPASE, BLOOD - Abnormal; Notable for the following:    Lipase <10 (*)    All other components within normal limits  APTT - Abnormal; Notable for the following:    aPTT 46 (*)    All other components within normal limits  PROTIME-INR - Abnormal; Notable for the following:    Prothrombin Time 22.7 (*)    All other components within normal limits  URINE CULTURE  AEROBIC/ANAEROBIC CULTURE (SURGICAL/DEEP WOUND)  LACTIC ACID, PLASMA  TROPONIN I  CREATININE, SERUM   ____________________________________________  EKG  ED ECG REPORT I, Saretta Dahlem, the attending physician, personally viewed and interpreted this ECG.  Date: 10/23/2016 EKG Time: 16:16 Rate: 128 Rhythm: Atrial flutter with RVR QRS Axis: normal Intervals: normal ST/T Wave abnormalities: Non-specific ST segment / T-wave changes, but no evidence of acute ischemia. Conduction Disturbances: none Narrative Interpretation:  unremarkable  ____________________________________________  RADIOLOGY   Dg Chest Port 1 View  Result Date: 10/23/2016 CLINICAL DATA:  Elevated white blood count. EXAM: PORTABLE CHEST 1 VIEW COMPARISON:  Radiograph of September 28, 2016. FINDINGS: Stable cardiomediastinal silhouette. No pneumothorax is noted. Left lung is clear. Atherosclerosis of thoracic aorta is noted. Moderately displaced right rib fractures are again noted. Stable right basilar opacity is noted consistent with atelectasis or scarring. Blunting of right costophrenic sulcus is noted most consistent with scarring. IMPRESSION: Stable moderately displaced right rib fractures. Aortic atherosclerosis. Stable right basilar atelectasis or scarring. Electronically Signed   By: Lupita Raider, M.D.   On: 10/23/2016 16:58    ____________________________________________   PROCEDURES  Procedure(s) performed:   .Critical Care Performed by: Loleta Rose Authorized by: Loleta Rose   Critical care provider statement:    Critical care time (minutes):  45   Critical care time was exclusive of:  Separately billable procedures and treating other patients   Critical care was necessary to treat or prevent imminent or life-threatening deterioration of the following conditions:  Sepsis   Critical care was time spent personally by me on the following activities:  Development of treatment plan with patient or surrogate, discussions with consultants, evaluation of patient's response to treatment, examination of patient, obtaining history from patient or surrogate, ordering and performing treatments and interventions, ordering and review of laboratory studies, ordering and review of radiographic studies, pulse oximetry, re-evaluation of patient's condition and review of old charts      Critical Care performed: Yes, see critical care procedure note(s) ____________________________________________   INITIAL IMPRESSION / ASSESSMENT AND  PLAN / ED COURSE  Pertinent labs & imaging results that were available during my care of the patient were reviewed by me and considered in my medical decision making (see chart for details).  Extensive history of sepsis and septic shock, presents with fevers at the facility, increased AMS, and moderate tachycardia.  Ordering sepsis protocol including wound culture from shoulder, empiric abx, 10mL/kg crystalloid goal.   Clinical Course as of Oct 24 3328  Fri Oct 23, 2016  1727 normal lactate, backing off on fluids.  Tachycardia improving.  +UTI and wound infection.  Contacting hospitalist for admission.  [CF]    Clinical Course User Index [CF] Loleta Rose, MD    ____________________________________________  FINAL CLINICAL IMPRESSION(S) / ED DIAGNOSES  Final diagnoses:  Wound infection  Sepsis, due to unspecified organism [facility])  Urinary tract infection without hematuria, site unspecified     MEDICATIONS GIVEN DURING THIS VISIT:  Medications  acetaminophen (TYLENOL) tablet 650 mg (650 mg Oral Given 10/23/16 2103)    Or  acetaminophen (TYLENOL) suppository 650 mg ( Rectal See Alternative 10/23/16 2103)  vancomycin (VANCOCIN) 1,500 mg in sodium chloride 0.9 % 500 mL IVPB (not administered)  piperacillin-tazobactam (ZOSYN) IVPB 3.375 g (3.375 g Intravenous New Bag/Given 10/23/16 2322)  0.9 %  sodium chloride infusion ( Intravenous New Bag/Given 10/23/16 2322)  sodium chloride 0.9 % bolus 1,000 mL (0 mLs Intravenous Stopped 10/23/16 1744)    And  sodium chloride 0.9 % bolus 500 mL (0 mLs Intravenous Stopped 10/23/16 1907)  piperacillin-tazobactam (ZOSYN) IVPB 3.375 g (0 g Intravenous Stopped 10/23/16 1638)  vancomycin (VANCOCIN) IVPB 1000 mg/200 mL premix (0 mg Intravenous Stopped 10/23/16 1744)  sodium chloride 0.9 % bolus 1,000 mL (0 mLs Intravenous Stopped 10/23/16 2143)    And  sodium chloride 0.9 % bolus 500 mL (0 mLs Intravenous Stopped 10/23/16 2212)     NEW OUTPATIENT  MEDICATIONS STARTED DURING THIS VISIT:  New Prescriptions   No medications on file    Modified Medications   No medications on file    Discontinued Medications   No medications on file     Note:  This document was prepared using Dragon voice recognition software and may include unintentional dictation errors.    Loleta Rose, MD 10/24/16 [phone]

## 2016-10-24 ENCOUNTER — Inpatient Hospital Stay: Payer: Medicare Other

## 2016-10-24 LAB — BASIC METABOLIC PANEL
Anion gap: 7 (ref 5–15)
BUN: 7 mg/dL (ref 6–20)
CALCIUM: 7.9 mg/dL — AB (ref 8.9–10.3)
CO2: 24 mmol/L (ref 22–32)
Chloride: 107 mmol/L (ref 101–111)
Creatinine, Ser: 0.69 mg/dL (ref 0.44–1.00)
GFR calc Af Amer: >60 mL/min (ref >60)
GLUCOSE: 79 mg/dL (ref 65–99)
POTASSIUM: 3.9 mmol/L (ref 3.5–5.1)
SODIUM: 138 mmol/L (ref 135–145)

## 2016-10-24 LAB — CBC
HEMATOCRIT: 26 % — AB (ref 35.0–47.0)
Hemoglobin: 8.3 g/dL — ABNORMAL LOW (ref 12.0–16.0)
MCH: 22.3 pg — ABNORMAL LOW (ref 26.0–34.0)
MCHC: 31.8 g/dL — AB (ref 32.0–36.0)
MCV: 70 fL — ABNORMAL LOW (ref 80.0–100.0)
PLATELETS: 367 10*3/uL (ref 150–440)
RBC: 3.71 MIL/uL — ABNORMAL LOW (ref 3.80–5.20)
RDW: 21.3 % — AB (ref 11.5–14.5)
WBC: 15.7 10*3/uL — AB (ref 3.6–11.0)

## 2016-10-24 LAB — GLUCOSE, CAPILLARY: Glucose-Capillary: 88 mg/dL (ref 65–99)

## 2016-10-24 LAB — PROCALCITONIN: PROCALCITONIN: 0.18 ng/mL

## 2016-10-24 LAB — MAGNESIUM: MAGNESIUM: 1.3 mg/dL — AB (ref 1.7–2.4)

## 2016-10-24 LAB — MRSA PCR SCREENING: MRSA BY PCR: NEGATIVE

## 2016-10-24 MED ORDER — CARBIDOPA-LEVODOPA 10-100 MG PO TABS
1.0000 | ORAL_TABLET | Freq: Three times a day (TID) | ORAL | Status: DC
  Administered 2016-10-24 – 2016-10-30 (×19): 1 via ORAL
  Filled 2016-10-24 (×20): qty 1

## 2016-10-24 MED ORDER — MELATONIN 5 MG PO TABS
5.0000 mg | ORAL_TABLET | Freq: Every day | ORAL | Status: DC
  Administered 2016-10-24 – 2016-10-29 (×6): 5 mg via ORAL
  Filled 2016-10-24 (×8): qty 1

## 2016-10-24 MED ORDER — PANTOPRAZOLE SODIUM 40 MG PO TBEC
40.0000 mg | DELAYED_RELEASE_TABLET | Freq: Every day | ORAL | Status: DC
  Administered 2016-10-24 – 2016-10-30 (×7): 40 mg via ORAL
  Filled 2016-10-24 (×7): qty 1

## 2016-10-24 MED ORDER — WARFARIN - PHARMACIST DOSING INPATIENT
Freq: Every day | Status: DC
  Administered 2016-10-24 – 2016-10-29 (×2)

## 2016-10-24 MED ORDER — CARVEDILOL 12.5 MG PO TABS
12.5000 mg | ORAL_TABLET | Freq: Two times a day (BID) | ORAL | Status: DC
  Administered 2016-10-24 – 2016-10-26 (×5): 12.5 mg via ORAL
  Filled 2016-10-24: qty 1
  Filled 2016-10-24 (×3): qty 2
  Filled 2016-10-24: qty 1
  Filled 2016-10-24: qty 2

## 2016-10-24 MED ORDER — WARFARIN SODIUM 2.5 MG PO TABS
2.5000 mg | ORAL_TABLET | Freq: Once | ORAL | Status: AC
  Administered 2016-10-24: 2.5 mg via ORAL
  Filled 2016-10-24: qty 1

## 2016-10-24 MED ORDER — PAROXETINE HCL 10 MG PO TABS
30.0000 mg | ORAL_TABLET | Freq: Every day | ORAL | Status: DC
  Administered 2016-10-24 – 2016-10-30 (×7): 30 mg via ORAL
  Filled 2016-10-24: qty 2
  Filled 2016-10-24: qty 3
  Filled 2016-10-24: qty 1
  Filled 2016-10-24: qty 2
  Filled 2016-10-24 (×3): qty 3

## 2016-10-24 MED ORDER — IPRATROPIUM-ALBUTEROL 0.5-2.5 (3) MG/3ML IN SOLN
3.0000 mL | Freq: Three times a day (TID) | RESPIRATORY_TRACT | Status: DC
  Administered 2016-10-24 – 2016-10-30 (×13): 3 mL via RESPIRATORY_TRACT
  Filled 2016-10-24 (×17): qty 3

## 2016-10-24 MED ORDER — AMIODARONE HCL IN DEXTROSE 360-4.14 MG/200ML-% IV SOLN
60.0000 mg/h | INTRAVENOUS | Status: DC
  Administered 2016-10-24: 60 mg/h via INTRAVENOUS
  Filled 2016-10-24 (×2): qty 200

## 2016-10-24 MED ORDER — ONDANSETRON HCL 4 MG PO TABS: 4.0000 mg | ORAL_TABLET | Freq: Four times a day (QID) | ORAL | Status: DC | PRN

## 2016-10-24 MED ORDER — TRAZODONE HCL 50 MG PO TABS
25.0000 mg | ORAL_TABLET | Freq: Every day | ORAL | Status: DC
  Administered 2016-10-24 – 2016-10-29 (×6): 25 mg via ORAL
  Filled 2016-10-24 (×6): qty 1

## 2016-10-24 MED ORDER — ALBUTEROL SULFATE (2.5 MG/3ML) 0.083% IN NEBU: 2.5000 mg | INHALATION_SOLUTION | RESPIRATORY_TRACT | Status: DC | PRN

## 2016-10-24 MED ORDER — MAGNESIUM SULFATE 2 GM/50ML IV SOLN
2.0000 g | Freq: Once | INTRAVENOUS | Status: AC
  Administered 2016-10-25: 2 g via INTRAVENOUS
  Filled 2016-10-24: qty 50

## 2016-10-24 MED ORDER — SODIUM CHLORIDE 0.9% FLUSH
3.0000 mL | Freq: Two times a day (BID) | INTRAVENOUS | Status: DC
  Administered 2016-10-24 – 2016-10-28 (×10): 3 mL via INTRAVENOUS

## 2016-10-24 MED ORDER — AMLODIPINE BESYLATE 10 MG PO TABS
10.0000 mg | ORAL_TABLET | Freq: Every day | ORAL | Status: DC
  Administered 2016-10-24 – 2016-10-25 (×2): 10 mg via ORAL
  Filled 2016-10-24 (×2): qty 1

## 2016-10-24 MED ORDER — AMIODARONE HCL IN DEXTROSE 360-4.14 MG/200ML-% IV SOLN
30.0000 mg/h | INTRAVENOUS | Status: DC
  Administered 2016-10-25 – 2016-10-26 (×3): 30 mg/h via INTRAVENOUS
  Filled 2016-10-24 (×2): qty 200

## 2016-10-24 MED ORDER — ORAL CARE MOUTH RINSE
15.0000 mL | Freq: Two times a day (BID) | OROMUCOSAL | Status: DC
  Administered 2016-10-24 – 2016-10-29 (×9): 15 mL via OROMUCOSAL

## 2016-10-24 MED ORDER — AMIODARONE LOAD VIA INFUSION
150.0000 mg | Freq: Once | INTRAVENOUS | Status: AC
  Administered 2016-10-24: 150 mg via INTRAVENOUS
  Filled 2016-10-24: qty 83.34

## 2016-10-24 MED ORDER — CHLORHEXIDINE GLUCONATE 0.12 % MT SOLN
15.0000 mL | Freq: Two times a day (BID) | OROMUCOSAL | Status: DC
  Administered 2016-10-24 – 2016-10-30 (×12): 15 mL via OROMUCOSAL
  Filled 2016-10-24 (×9): qty 15

## 2016-10-24 MED ORDER — WARFARIN SODIUM 1 MG PO TABS: 4.0000 mg | ORAL_TABLET | Freq: Every day | ORAL | Status: DC

## 2016-10-24 MED ORDER — ONDANSETRON HCL 4 MG/2ML IJ SOLN
4.0000 mg | Freq: Four times a day (QID) | INTRAMUSCULAR | Status: DC | PRN
  Administered 2016-10-24: 4 mg via INTRAVENOUS
  Filled 2016-10-24: qty 2

## 2016-10-24 MED ORDER — SALINE SPRAY 0.65 % NA SOLN
1.0000 | NASAL | Status: DC | PRN
  Filled 2016-10-24: qty 44

## 2016-10-24 MED ORDER — POTASSIUM CHLORIDE CRYS ER 20 MEQ PO TBCR
20.0000 meq | EXTENDED_RELEASE_TABLET | Freq: Every day | ORAL | Status: DC
  Administered 2016-10-24 – 2016-10-28 (×5): 20 meq via ORAL
  Filled 2016-10-24 (×5): qty 1

## 2016-10-24 MED ORDER — GABAPENTIN 300 MG PO CAPS
300.0000 mg | ORAL_CAPSULE | Freq: Three times a day (TID) | ORAL | Status: DC
  Administered 2016-10-24 – 2016-10-30 (×19): 300 mg via ORAL
  Filled 2016-10-24 (×19): qty 1

## 2016-10-24 MED ORDER — POTASSIUM CHLORIDE IN NACL 20-0.9 MEQ/L-% IV SOLN
INTRAVENOUS | Status: DC
  Administered 2016-10-24 – 2016-10-26 (×4): via INTRAVENOUS
  Filled 2016-10-24 (×9): qty 1000

## 2016-10-24 NOTE — Progress Notes (Signed)
TALKED TO PRIME DOCTOR ABOUT PATIENT'S HR IN THE 120'S AND LOW 130'S. PRIME DOCTOR SAID THAT HE WAS OK WITH It. WILL CONTINUE TO MONITOR PATIENT.

## 2016-10-24 NOTE — Progress Notes (Signed)
ANTICOAGULATION CONSULT NOTE - Initial Consult  Pharmacy Consult for warfarin dosing Indication: atrial fibrillation  Allergies  Allergen Reactions  . Sulfa Antibiotics Hives    Patient Measurements: Height: 5\' 4"  (162.6 cm) Weight: 236 lb (107 kg) IBW/kg (Calculated) : 54.7 Heparin Dosing Weight: n/a  Vital Signs: Temp: 98.1 F (36.7 C) (12/30 0152) Temp Source: Oral (12/29 2103) BP: 110/75 (12/30 0100) Pulse Rate: 132 (12/30 0100)  Labs:  Recent Labs  10/23/16 1550  HGB 9.4*  HCT 30.0*  PLT 509*  APTT 46*  LABPROT 22.7*  INR 1.97  CREATININE 0.70  TROPONINI <0.03    Estimated Creatinine Clearance: 71.4 mL/min (by C-G formula based on SCr of 0.7 mg/dL).   Medical History: Past Medical History:  Diagnosis Date  . Atrial fibrillation (HCC)   . Empyema [facility](HCC)    in [facility] Aug 2017- stayed in hospital on for 2 months.  . Hypertension     Medications:  Home dose is 4 mg daily per med rec  Assessment: INR slightly subtherapeutic on admission.    Goal of Therapy:  INR 2-3    Plan:  Resume home regimen of 4 mg daily. INR daily while on antibiotic therapy.  Cayli Escajeda S 10/24/2016,3:49 AM

## 2016-10-24 NOTE — Progress Notes (Signed)
ANTICOAGULATION CONSULT NOTE - Initial Consult  Pharmacy Consult for warfarin dosing Indication: atrial fibrillation  Allergies  Allergen Reactions  . Sulfa Antibiotics Hives    Patient Measurements: Height: 5\' 4"  (162.6 cm) Weight: 236 lb (107 kg) IBW/kg (Calculated) : 54.7 Heparin Dosing Weight: n/a  Vital Signs: BP: 108/79 (12/30 1200) Pulse Rate: 139 (12/30 1200)  Labs:  Recent Labs  10/23/16 1550 10/24/16 0441  HGB 9.4* 8.3*  HCT 30.0* 26.0*  PLT 509* 367  APTT 46*  --   LABPROT 22.7*  --   INR 1.97  --   CREATININE 0.70 0.69  TROPONINI <0.03  --     Estimated Creatinine Clearance: 71.4 mL/min (by C-G formula based on SCr of 0.69 mg/dL).   Medical History: Past Medical History:  Diagnosis Date  . Atrial fibrillation (HCC)   . Empyema [facility](HCC)    in [facility] Aug 2017- stayed in hospital on for 2 months.  . Hypertension     Medications:  Home dose is 4 mg daily per med rec. Patient now being started on amiodarone. Patient also receiving Abx.   Assessment: INR slightly subtherapeutic on admission.   Goal of Therapy:  INR 2-3    Plan:  Give reduced warfarin dose of 2.5mg  tonight, INR daily while on antibiotic and amiodarone.   Gardner CandleSheema M Heli Dino, PharmD, BCPS Clinical Pharmacist 10/24/2016 2:31 PM

## 2016-10-24 NOTE — Progress Notes (Signed)
[facility] [place] [facility]   PATIENT NAME: Mikayla [name]    MR#:  [id number]  DATE OF BIRTH:  03/04/1941  SUBJECTIVE:  Came in with abnormal labs from the SNF Found to have tachycardia and UTI  REVIEW OF SYSTEMS:   Review of Systems  Constitutional: Negative for chills, fever and weight loss.  HENT: Negative for ear discharge, ear pain and nosebleeds.   Eyes: Negative for blurred vision, pain and discharge.  Respiratory: Negative for sputum production, shortness of breath, wheezing and stridor.   Cardiovascular: Negative for chest pain, palpitations, orthopnea and PND.  Gastrointestinal: Negative for abdominal pain, diarrhea, nausea and vomiting.  Genitourinary: Negative for frequency and urgency.  Musculoskeletal: Positive for back pain and joint pain.  Neurological: Positive for focal weakness and weakness. Negative for sensory change and speech change.  Psychiatric/Behavioral: Negative for depression and hallucinations. The patient is not nervous/anxious.    Tolerating Diet:yes Tolerating PT:   DRUG ALLERGIES:   Allergies  Allergen Reactions  . Sulfa Antibiotics Hives    VITALS:  Blood pressure 108/79, pulse (!) 139, temperature 98.1 F (36.7 C), temperature source Oral, resp. rate 15, height 5\' 4"  (1.626 m), weight 107 kg (236 lb), SpO2 99 %.  PHYSICAL EXAMINATION:   Physical Exam  GENERAL:  75 y.o.-year-old patient lying in the bed with no acute distress. obese EYES: Pupils equal, round, reactive to light and accommodation. No scleral icterus. Extraocular muscles intact.  HEENT: Head atraumatic, normocephalic. Oropharynx and nasopharynx clear.  NECK:  Supple, no jugular venous distention. No thyroid enlargement, no tenderness.  LUNGS: Normal breath sounds bilaterally, no wheezing, rales, rhonchi. No use of accessory muscles of respiration. Right chest holes++ CARDIOVASCULAR: S1, S2 normal. No murmurs, rubs, or gallops.   ABDOMEN: Soft, nontender, nondistended. Bowel sounds present. No organomegaly or mass.  EXTREMITIES: No cyanosis, clubbing or edema b/l.    NEUROLOGIC: Cranial nerves II through XII are intact. No focal Motor or sensory deficits b/l.  Left weakness PSYCHIATRIC:  patient is alert and oriented x 3.  SKIN: No obvious rash, lesion, or ulcer.   LABORATORY PANEL:  CBC  Recent Labs Lab 10/24/16 0441  WBC 15.7*  HGB 8.3*  HCT 26.0*  PLT 367    Chemistries   Recent Labs Lab 10/23/16 1550 10/24/16 0441  NA 134* 138  K 3.4* 3.9  CL 97* 107  CO2 29 24  GLUCOSE 90 79  BUN 8 7  CREATININE 0.70 0.69  CALCIUM 8.6* 7.9*  MG  --  1.3*  AST 14*  --   ALT <5*  --   ALKPHOS 62  --   BILITOT 0.6  --    Cardiac Enzymes  Recent Labs Lab 10/23/16 1550  TROPONINI <0.03   RADIOLOGY:  Dg Chest Port 1 View  Result Date: 10/23/2016 CLINICAL DATA:  Elevated white blood count. EXAM: PORTABLE CHEST 1 VIEW COMPARISON:  Radiograph of September 28, 2016. FINDINGS: Stable cardiomediastinal silhouette. No pneumothorax is noted. Left lung is clear. Atherosclerosis of thoracic aorta is noted. Moderately displaced right rib fractures are again noted. Stable right basilar opacity is noted consistent with atelectasis or scarring. Blunting of right costophrenic sulcus is noted most consistent with scarring. IMPRESSION: Stable moderately displaced right rib fractures. Aortic atherosclerosis. Stable right basilar atelectasis or scarring. Electronically Signed   By: Lupita RaiderJames  Green Jr, M.D.   On: 10/23/2016 16:58   ASSESSMENT AND PLAN:  Mikayla [name]  is a 75 y.o. female with a  known history of A. fib, empyema, hypertension and C. difficile colitis. The patient was sent from nursing home to the ED due to above chief complaints. The patient denies any symptoms, but she is not good historian. She was found leukocytosis and A. fib with RVR. Urinalysis show UTI. \   *Sepsis with UTI and back wound  infection Cont IVvancomycin and Zosyn, pharmacy to dose.  -IV fluids to support, Follow-up CBC and cultures. Wound care. -de-escalate IV abxs  * A. fib with RVR due to sepsis. Continue Coreg and Coumadin and pharmacy to dose.  -consider cardiology consult  -?Iv cardizem  *Hypokalemia. Give potassium supplement, follow-up potassium and magnesium level.  *Hyponatremia. Give normal saline IV and follow-up BMP.  Case discussed with Care Management/Social Worker. Management plans discussed with the patient, family and they are in agreement.  CODE STATUS: DNR  DVT Prophylaxis: lovenox TOTAL TIME TAKING CARE OF THIS PATIENT: 40 minutes.  >50% time spent on counselling and coordination of care  POSSIBLE D/C IN 2-3 DAYS, DEPENDING ON CLINICAL CONDITION.  Note: This dictation was prepared with Dragon dictation along with smaller phrase technology. Any transcriptional errors that result from this process are unintentional.  Jaben Benegas M.D on 10/24/2016 at 1:45 PM  Between 7am to 6pm - Pager - [phone]  After 6pm go to [email or web address] - password [email or web address]  Fabio Neighborsagle [facility] [facility]  Office  [phone]  CC: Primary care physician; Pcp Not In System

## 2016-10-24 NOTE — Progress Notes (Signed)
MEDICATION RELATED CONSULT NOTE - INITIAL   Pharmacy Consult for drug-drug interactions with amiodarone   Allergies  Allergen Reactions  . Sulfa Antibiotics Hives    Patient Measurements: Height: 5\' 4"  (162.6 cm) Weight: 236 lb (107 kg) IBW/kg (Calculated) : 54.7  Vital Signs: BP: 108/79 (12/30 1200) Pulse Rate: 139 (12/30 1200) Intake/Output from previous day: 12/29 0701 - 12/30 0700 In: 2800 [I.V.:50; IV Piggyback:2750] Out: -   Recent Labs  10/23/16 1550 10/24/16 0441  WBC 16.1* 15.7*  HGB 9.4* 8.3*  HCT 30.0* 26.0*  PLT 509* 367  APTT 46*  --   CREATININE 0.70 0.69  MG  --  1.3*  ALBUMIN 2.3*  --   PROT 6.6  --   AST 14*  --   ALT <5*  --   ALKPHOS 62  --   BILITOT 0.6  --    Estimated Creatinine Clearance: 71.4 mL/min (by C-G formula based on SCr of 0.69 mg/dL).   Microbiology:  Medical History: Past Medical History:  Diagnosis Date  . Atrial fibrillation (HCC)   . Empyema [facility](HCC)    in [facility] Aug 2017- stayed in hospital on for 2 months.  . Hypertension     Medications:  Scheduled:  . amLODipine  10 mg Oral Daily  . carbidopa-levodopa  1 tablet Oral TID  . carvedilol  12.5 mg Oral BID WC  . chlorhexidine  15 mL Mouth Rinse BID  . gabapentin  300 mg Oral TID  . ipratropium-albuterol  3 mL Nebulization TID  . magnesium sulfate 1 - 4 g bolus IVPB  2 g Intravenous Once  . mouth rinse  15 mL Mouth Rinse q12n4p  . Melatonin  5 mg Oral QHS  . pantoprazole  40 mg Oral Daily  . PARoxetine  30 mg Oral Daily  . piperacillin-tazobactam (ZOSYN)  IV  3.375 g Intravenous Q8H  . potassium chloride SA  20 mEq Oral Daily  . sodium chloride flush  3 mL Intravenous Q12H  . traZODone  25 mg Oral QHS  . vancomycin  1,500 mg Intravenous Q12H  . warfarin  4 mg Oral q1800   Infusions:  . 0.9 % NaCl with KCl 20 mEq / L 75 mL/hr at 10/24/16 0333   PRN: acetaminophen **OR** acetaminophen, albuterol, ondansetron **OR** ondansetron (ZOFRAN) IV, sodium  chloride  Assessment: 75 yo female with sepsis (UTI and back wound infection) and A. Fib with RVR. Pharmacy consulted to identify drug-drug interactions and recommend medication adjustments for those altered by adding amiodarone to the medication regimen.  Plan:  Concurrent use of AMIODARONE and TRAZADONE and PAROXETINE may result in increased risk of QT prolongation and torsades de pointes.   Concurrent use of AMIODARONE and WARFARIN may result in an increased risk of bleeding. Will give lower dose of warfarin this evening, and monitor INR daily while on amiodarone and ABx.      Gardner CandleSheema M Tayva Easterday, PharmD, BCPS Clinical Pharmacist 10/24/2016 2:24 PM

## 2016-10-24 NOTE — Care Management (Signed)
[facility] per CSW note. CSW consult placed.

## 2016-10-24 NOTE — Progress Notes (Signed)
Asked by patient to call family. Called daughter Rayfield CitizenCaroline who immediately called back.  Daughter reports they are on the way to the hospital to visit with patient.

## 2016-10-24 NOTE — Progress Notes (Signed)
eLink Physician-Brief Progress Note Patient Name: Doristine JohnsCatherine R Luscher DOB: 01/11/1941 MRN: [id number]   Date of Service  10/24/2016  HPI/Events of Note  Sepsis- UTI vs back wound infection, lactate 1.2 AF-RVR DNR  eICU Interventions  none     Intervention Category Evaluation Type: New Patient Evaluation  ALVA,RAKESH V. 10/24/2016, 2:26 AM

## 2016-10-24 NOTE — Progress Notes (Signed)
Pt found laying in bed soaked with urine from her shoulders to her ankles.  Pt rolled, bathed, and cleaned by this RN and Diannia RuderKara CNA.  Dr. Enedina FinnerSona Patel at bedside to evaluate patient.  Verbal order for urinary catheter received.  Attempted to place foley, unsuccessful at this time, as patient is screaming "I beg you to stop, stop stop."  Pt's lower extremities stiff and she is unable to relax.  Foley placement is not likely at this time.  Will inform Dr. Allena KatzPatel.

## 2016-10-25 ENCOUNTER — Inpatient Hospital Stay
Admit: 2016-10-25 | Discharge: 2016-10-25 | Disposition: A | Discharge status: Home / Self Care | Payer: Medicare Other | Attending: Cardiovascular Disease | Admitting: Cardiovascular Disease

## 2016-10-25 LAB — BASIC METABOLIC PANEL
Anion gap: 7 (ref 5–15)
BUN: 6 mg/dL (ref 6–20)
CHLORIDE: 105 mmol/L (ref 101–111)
CO2: 24 mmol/L (ref 22–32)
CREATININE: 0.66 mg/dL (ref 0.44–1.00)
Calcium: 7.4 mg/dL — ABNORMAL LOW (ref 8.9–10.3)
GFR calc non Af Amer: >60 mL/min (ref >60)
Glucose, Bld: 94 mg/dL (ref 65–99)
POTASSIUM: 4.6 mmol/L (ref 3.5–5.1)
SODIUM: 136 mmol/L (ref 135–145)

## 2016-10-25 LAB — CBC
HEMATOCRIT: 22.2 % — AB (ref 35.0–47.0)
HEMOGLOBIN: 7.1 g/dL — AB (ref 12.0–16.0)
MCH: 22.7 pg — AB (ref 26.0–34.0)
MCHC: 32.1 g/dL (ref 32.0–36.0)
MCV: 70.8 fL — ABNORMAL LOW (ref 80.0–100.0)
Platelets: 402 10*3/uL (ref 150–440)
RBC: 3.14 MIL/uL — AB (ref 3.80–5.20)
RDW: 21 % — ABNORMAL HIGH (ref 11.5–14.5)
WBC: 9.3 10*3/uL (ref 3.6–11.0)

## 2016-10-25 LAB — VANCOMYCIN, TROUGH: Vancomycin Tr: 24 ug/mL (ref 15–20)

## 2016-10-25 LAB — PROTIME-INR
INR: 1.86
PROTHROMBIN TIME: 21.7 s — AB (ref 11.4–15.2)

## 2016-10-25 LAB — HEMOGLOBIN AND HEMATOCRIT, BLOOD
HCT: 21.5 % — ABNORMAL LOW (ref 35.0–47.0)
Hemoglobin: 6.9 g/dL — ABNORMAL LOW (ref 12.0–16.0)

## 2016-10-25 LAB — ABO/RH: ABO/RH(D): A POS

## 2016-10-25 LAB — MAGNESIUM: MAGNESIUM: 1.5 mg/dL — AB (ref 1.7–2.4)

## 2016-10-25 MED ORDER — DIPHENHYDRAMINE HCL 25 MG PO CAPS: 25.0000 mg | ORAL_CAPSULE | Freq: Once | ORAL | Status: DC

## 2016-10-25 MED ORDER — DIGOXIN 0.25 MG/ML IJ SOLN
0.2500 mg | Freq: Once | INTRAMUSCULAR | Status: AC
  Administered 2016-10-25: 0.25 mg via INTRAVENOUS
  Filled 2016-10-25: qty 2

## 2016-10-25 MED ORDER — CELECOXIB 100 MG PO CAPS
100.0000 mg | ORAL_CAPSULE | Freq: Two times a day (BID) | ORAL | Status: DC
Start: 2016-10-25 — End: 2016-10-30
  Administered 2016-10-25 – 2016-10-30 (×11): 100 mg via ORAL
  Filled 2016-10-25 (×14): qty 1

## 2016-10-25 MED ORDER — ACETAMINOPHEN 325 MG PO TABS: 650.0000 mg | ORAL_TABLET | Freq: Once | ORAL | Status: DC

## 2016-10-25 MED ORDER — SODIUM CHLORIDE 0.9 % IV SOLN: Freq: Once | INTRAVENOUS | Status: DC

## 2016-10-25 MED ORDER — VANCOMYCIN HCL 10 G IV SOLR
1500.0000 mg | INTRAVENOUS | Status: DC
  Administered 2016-10-26: 1500 mg via INTRAVENOUS
  Filled 2016-10-25: qty 1500

## 2016-10-25 NOTE — Progress Notes (Signed)
Pt hemoglobin resulted this afternoon 6.9.  Spoke with patient about transfusion and she initially agreed, but then stated she wished to have more time to consider whether she wants a transfusion or not.  Transfusion would be non emergent, so orders were cancelled and this can be discussed with patient in more detail in the morning.  Kristeen MissWILLIS, Laaibah Wartman FIELDING [facility] 10/25/2016, 11:53 PM

## 2016-10-25 NOTE — Progress Notes (Signed)
*  PRELIMINARY RESULTS* Echocardiogram 2D Echocardiogram has been performed.  Mikayla [name] 10/25/2016, 1:58 PM 

## 2016-10-25 NOTE — Plan of Care (Signed)
Problem: Skin Integrity: Goal: Risk for impaired skin integrity will decrease Outcome: Progressing BP (!) 90/58   Pulse (!) 115   Temp 97.7 F (36.5 C) (Oral)   Resp (!) 24   Ht 5\' 4"  (1.626 m)   Wt 107 kg (236 lb)   SpO2 92%   BMI 40.51 kg/m   POC reviewed with patient, cont on q2 turns, vital signs closely monitored and any concerns addressed at this time. Will continue to monitor.   Mental Orientation: A&O x 4 Telemetry: Pt placed on monitor. Central tele and Elink aware of pt's transfer Assessment: Completed Skin: Stage 2 to left butt cheek Mobility: Left arm contracted and bilateral legs IV: flushes easily, no pain, no blood return Pain: no pain  Environmental changes completed to facilitate rest and relaxation.  Safety Measures: Bed alarm obn, 2/4 bed rails up.  Unit Orientation: Pt oriented to room, has received patient guide, and taught how to use call bell system.  Family: aware of pt's admission    Intake/Output Summary (Last 24 hours) at 10/25/16 1013 Last data filed at 10/25/16 0931  Gross per 24 hour  Intake          4282.52 ml  Output                0 ml  Net          4282.52 ml    2 episodes of incontinence so far on this shift, unable to place foley due to pt being contracted

## 2016-10-25 NOTE — Clinical Social Work Note (Signed)
Clinical Social Work Assessment  Patient Details  Name: Mikayla [name] MRN: [id number] Date of Birth: 10/05/1941  Date of referral:  10/25/16               Reason for consult:  Facility Placement                Permission sought to share information with:  Facility Industrial/product designerContact Representative Permission granted to share information::  Yes, Verbal Permission Granted  Name::        Agency::     Relationship::     Contact Information:     Housing/Transportation Living arrangements for the past 2 months:  Skilled Nursing Facility Source of Information:  Facility Patient Interpreter Needed:  None Criminal Activity/Legal Involvement Pertinent to Current Situation/Hospitalization:  No - Comment as needed Significant Relationships:  Adult Children Lives with:  Facility Resident Do you feel safe going back to the place where you live?  Yes Need for family participation in patient care:  No (Coment)  Care giving concerns:  Patient admitted from facility   Social Worker assessment / plan:  CSW contacted facility after attempting to contact family. [facility] reported that the patient can return when stable. The patient is baseline nonambulatory and incontinent. [facility]. Amy indicated that the patient has had significant pain with movement in the past 3 weeks which has escalated. She also reported that the patient likes to sit up in a geri-chair for 2-3 hours at a time, usually around meal times.  Employment status:  Retired Database administratornsurance information:  Managed Medicare PT Recommendations:  Skilled Nursing Facility Information / Referral to community resources:     Patient/Family's Response to care:  Facility thanked CSW for update.  Patient/Family's Understanding of and Emotional Response to Diagnosis, Current Treatment, and Prognosis:  Facility willing to accept patient at dc.  Emotional Assessment Appearance:  Appears stated  age Attitude/Demeanor/Rapport:   (Patient was sleeping) Affect (typically observed):   (Patient was sleeping) Orientation:   (Patient was sleeping) Alcohol / Substance use:  Never Used Psych involvement (Current and /or in the community):  No (Comment)  Discharge Needs  Concerns to be addressed:  Discharge Planning Concerns, Care Coordination Readmission within the last 30 days:  Yes Current discharge risk:  Chronically ill Barriers to Discharge:  Continued Medical Work up   [employer] CorporationKaren M Neya Creegan, LCSW 10/25/2016, 2:27 PM

## 2016-10-25 NOTE — Progress Notes (Signed)
[employer] [facility] [place] [facility]   PATIENT NAME: Mikayla LenteCatherine [name]    MR#:  [id number]  DATE OF BIRTH:  06/01/1941  SUBJECTIVE:  CHIEF COMPLAINT:   Chief Complaint  Patient presents with  . Abnormal Lab   - Admitted with UTI and tachycardia. -Remains in atrial fibrillation and on a amiodarone drip. -Cultures are pending.  REVIEW OF SYSTEMS:  Review of Systems  Constitutional: Positive for malaise/fatigue. Negative for chills, fever and weight loss.  HENT: Negative for ear discharge, hearing loss and nosebleeds.   Eyes: Negative for blurred vision, double vision and photophobia.  Respiratory: Negative for cough, hemoptysis, shortness of breath and wheezing.   Cardiovascular: Positive for palpitations. Negative for chest pain, orthopnea and leg swelling.  Gastrointestinal: Negative for abdominal pain, constipation, diarrhea, heartburn, melena, nausea and vomiting.  Genitourinary: Negative for dysuria.  Musculoskeletal: Positive for back pain, joint pain and myalgias. Negative for neck pain.  Skin: Negative for rash.  Neurological: Negative for dizziness, speech change, focal weakness and headaches.  Endo/Heme/Allergies: Does not bruise/bleed easily.  Psychiatric/Behavioral: Negative for depression.    DRUG ALLERGIES:   Allergies  Allergen Reactions  . Sulfa Antibiotics Hives    VITALS:  Blood pressure (!) 90/58, pulse (!) 115, temperature 97.7 F (36.5 C), temperature source Oral, resp. rate (!) 24, height 5\' 4"  (1.626 m), weight 107 kg (236 lb), SpO2 92 %.  PHYSICAL EXAMINATION:  Physical Exam  GENERAL:  75 y.o.-year-old Obese patient lying in the bed with no acute distress. Appears contracted EYES: Pupils equal, round, reactive to light and accommodation. No scleral icterus. Extraocular muscles intact.  HEENT: Head atraumatic, normocephalic. Oropharynx and nasopharynx clear.  NECK:  Supple, no jugular venous distention. No thyroid enlargement,  no tenderness.  LUNGS: Normal breath sounds bilaterally, no wheezing, rales,rhonchi or crepitation. No use of accessory muscles of respiration. Decreased bibasilar breath sounds. CARDIOVASCULAR: S1, S2 normal. No murmurs, rubs, or gallops.  ABDOMEN: Soft, nontender, nondistended. Bowel sounds present. No organomegaly or mass.  EXTREMITIES: No pedal edema, cyanosis, or clubbing.  NEUROLOGIC: Cranial nerves II through XII are intact. Following simple commands. Slightly weaker on the left side. Has significant arthritis. PSYCHIATRIC: The patient is alert and oriented x 3.  SKIN: No obvious rash, lesion, or ulcer.    LABORATORY PANEL:   CBC  Recent Labs Lab 10/24/16 0441  WBC 15.7*  HGB 8.3*  HCT 26.0*  PLT 367   ------------------------------------------------------------------------------------------------------------------  Chemistries   Recent Labs Lab 10/23/16 1550 10/24/16 0441  NA 134* 138  K 3.4* 3.9  CL 97* 107  CO2 29 24  GLUCOSE 90 79  BUN 8 7  CREATININE 0.70 0.69  CALCIUM 8.6* 7.9*  MG  --  1.3*  AST 14*  --   ALT <5*  --   ALKPHOS 62  --   BILITOT 0.6  --    ------------------------------------------------------------------------------------------------------------------  Cardiac Enzymes  Recent Labs Lab 10/23/16 1550  TROPONINI <0.03   ------------------------------------------------------------------------------------------------------------------  RADIOLOGY:  Dg Chest Port 1 View  Result Date: 10/24/2016 CLINICAL DATA:  PICC line on right. Pt unable to be fully supine for image, best image possible EXAM: PORTABLE CHEST 1 VIEW COMPARISON:  10/23/2016 FINDINGS: New right-sided PICC line tip overlies the level of the lower superior vena cava. Patchy infiltrate again and pleural effusion again identified at the right lung base, associated with volume loss. Numerous right rib fractures are again noted. Chronic changes are seen in the left shoulder.  There is  a small nodular density at the left lung base. There is no pulmonary edema. IMPRESSION: 1. Interval placement of right-sided PICC line, tip overlying the level of superior vena cava. 2. Stable appearance of atelectasis/consolidation at the right lung base associated with pleural effusion. 3. Right rib fractures. 4. Left lung nodule. As indicated, further characterization of this nodule could be performed with CT. Electronically Signed   By: Norva PavlovElizabeth  Brown M.D.   On: 10/24/2016 19:03   Dg Chest Port 1 View  Result Date: 10/23/2016 CLINICAL DATA:  Elevated white blood count. EXAM: PORTABLE CHEST 1 VIEW COMPARISON:  Radiograph of September 28, 2016. FINDINGS: Stable cardiomediastinal silhouette. No pneumothorax is noted. Left lung is clear. Atherosclerosis of thoracic aorta is noted. Moderately displaced right rib fractures are again noted. Stable right basilar opacity is noted consistent with atelectasis or scarring. Blunting of right costophrenic sulcus is noted most consistent with scarring. IMPRESSION: Stable moderately displaced right rib fractures. Aortic atherosclerosis. Stable right basilar atelectasis or scarring. Electronically Signed   By: Lupita RaiderJames  Green Jr, M.D.   On: 10/23/2016 16:58    EKG:   Orders placed or performed during the hospital encounter of 10/23/16  . EKG 12-Lead  . EKG 12-Lead  . EKG 12-Lead  . EKG 12-Lead    ASSESSMENT AND PLAN:   CatherineNoblittis a 75 y.o.femalewith a known history of A. fib, empyema, hypertension and C. difficile colitis. The patient was sent from nursing home due to abnormal labs and tachycardia.  #1 sepsis-secondary to urinary tract infection. Pending urine cultures at this time. -On vancomycin and Zosyn. No fevers. Continue to follow WBC and await culture results. -Once cultures are available, can de-escalate the antibiotics. -Chest x-ray with no evidence of infection. No recurrence of empyema noted.  #2 atrial fibrillation with  rapid ventricular response- -Appreciate cardiology consult. Patient on amiodarone drip. Also on Coreg if blood pressure can accommodate. -One dose of IV digoxin given. -Echocardiogram is ordered. - Coumadin for anticoagulation.  #3 hypokalemia and hypomagnesemia-being replaced and follow-up  #4 anemia of chronic disease-secondary to recent hospitalizations. No indication for transfusion at this time. Monitor closely.  #5 Parkinson's disease-continue Sinemet.  #6 arthritis-on gabapentin and Celebrex.  #7 DVT prophylaxis-continue Coumadin  Physical therapy and occupational therapy consults   Updated son-in-law at bedside.   All the records are reviewed and case discussed with Care Management/Social Workerr. Management plans discussed with the patient, family and they are in agreement.  CODE STATUS: DNR  TOTAL TIME TAKING CARE OF THIS PATIENT: 38 minutes.   POSSIBLE D/C IN 2-3 DAYS, DEPENDING ON CLINICAL CONDITION.   Enid BaasKALISETTI,Zhaire Locker M.D on 10/25/2016 at 1:18 PM  Between 7am to 6pm - Pager - [phone]  After 6pm go to [email or web address] - password [email or web address]  [facility]  Office  [phone]  CC: Primary care physician; Pcp Not In System

## 2016-10-25 NOTE — Progress Notes (Signed)
Pharmacy Antibiotic Note  Mikayla [name] is a 75 y.o. female admitted on 10/23/2016 with sepsis.  Pharmacy has been consulted for vancomycin/Zosyn dosing.  Plan: Will initiated Vancomycin 1000 mg IV x 1 then Vancomycin 1500mg  IV Q12h after 1st dose per stacked dose protocol. Will order Vancomycin trough level prior to the fifth dose.   Ke=0.072 T1/2= 9.63 VD=74.9 Stacked=6hrs  12/31 PM vanc level 24. Changing to 1500 mg q 18 hours. Level before 3rd new dose.  Zosyn 3.375g IV q8h (4 hour infusion).  Height: 5\' 4"  (162.6 cm) Weight: 236 lb (107 kg) IBW/kg (Calculated) : 54.7  Temp (24hrs), Avg:97.9 F (36.6 C), Min:96.7 F (35.9 C), Max:98.7 F (37.1 C)   Recent Labs Lab 10/23/16 1542 10/23/16 1550 10/24/16 0441 10/25/16 1530 10/25/16 2151  WBC  --  16.1* 15.7* 9.3  --   CREATININE  --  0.70 0.69 0.66  --   LATICACIDVEN 1.2  --   --   --   --   VANCOTROUGH  --   --   --   --  24*    Estimated Creatinine Clearance: 71.4 mL/min (by C-G formula based on SCr of 0.66 mg/dL).    Allergies  Allergen Reactions  . Sulfa Antibiotics Hives    Antimicrobials this admission: Vancomycin 12/29>> Zosyn 12/29 >>  Microbiology results: 12/29 BCx: in process 12/29 UCx: in process 12/29 Aerobic Cx: in process  12/29 UA: + leuks and bacteria   Thank you for allowing pharmacy to be a part of this patient's care.  Rabab Currington S, PharmD 10/25/2016 10:36 PM

## 2016-10-25 NOTE — Consult Note (Signed)
WOC Nurse wound consult note Reason for Consult: three Stage 2 pressure injuries (2 on the buttocks and one on the right medial heel) in the presence of urinary incontinence managed by adult disposable briefs.  Patient is reluctant to reposition off of supine position and required two to turn and reposition properly. Sacrum is intact as is left heel. Wound type:Moisture, pressure, friction Pressure Ulcer POA: Yes Measurement: left buttock measures 2cm x 1.5cm x 0.1cm, left aspect of buttock crease measures 2.4cm x 2cm x 0.1cm.  Right medial heel measures 2cm rouind x 0.1cm Wound bed: All three areas are pink, moist and painful to patient.  Drainage (amount, consistency, odor) Scant serous on old dressings Periwound: Intact, moist  Dressing procedure/placement/frequency: Patient is on a therapeutic mattress with low air loss feature, but is reluctant to turn and reposition.  It is noted that is is difficult to open her legs for adequate cleansing and assessment. Moist, slightly malodorous perineal area noted.  I will ask Nursing to discontinue disposable briefs while in house and turn and reposition from side to side-avoiding the supine position. A pressure redistribution chair pad and bilateral pressure redistribution heel boots are provided. Topical care recommendations for Nursing are provided via the Orders. WOC nursing team will not follow, but will remain available to this patient, the nursing and medical teams.  Please re-consult if needed. Thanks, Ladona MowLaurie Tamiya Colello, MSN, RN, GNP, Hans EdenCWOCN, CWON-AP, FAAN  Pager# [phone]

## 2016-10-25 NOTE — Evaluation (Signed)
Physical Therapy Evaluation Patient Details Name: Mikayla [name] MRN: [id number] DOB: [name]1 Today's Date: 10/25/2016   History of Present Illness  presented to ER secondary to leukocytosis; admitted with sepsis related to UTI, afib with RVR (requiring amio drip; plans to convert to PO next date)  Clinical Impression  Patient globally weak and deconditioned throughout all extremities; appears generally hemiparetic (decreased strenght/ROM, increased tone) in L UE/LE, though no documented neurological event noted.  Currently requiring total assist +2 for rolling and repositioning in bed; very limited ability to actively assist with mobility efforts. Unable to tolerate activity beyond bed-level this date.  Will continue to assess/progress mobility as appropriate, with goal of attempting transition to sitting edge of bed next date (as medically stable) Would benefit from skilled PT to address above deficits and promote optimal return to PLOF; recommend transition to STR upon discharge from acute hospitalization.     Follow Up Recommendations SNF    Equipment Recommendations       Recommendations for Other Services       Precautions / Restrictions Precautions Precautions: Fall Precaution Comments: PEG, stage II pressure area to buttocks, R heel and R shoulder (previous surgical incision) Restrictions Weight Bearing Restrictions: No      Mobility  Bed Mobility Overal bed mobility: Needs Assistance Bed Mobility: Rolling Rolling: Total assist;+2 for physical assistance            Transfers                 General transfer comment: unsafe/unable due to extensive assist for bed mobility and incontinent BM during session  Ambulation/Gait             General Gait Details: unsafe/unable  Stairs            Wheelchair Mobility    Modified Rankin (Stroke Patients Only)       Balance                                              Pertinent Vitals/Pain Pain Assessment: Faces Faces Pain Scale: Hurts little more Pain Location: generalized, bilat knees Pain Descriptors / Indicators: Aching;Grimacing Pain Intervention(s): Limited activity within patient's tolerance;Monitored during session;Repositioned    Home Living Family/patient expects to be discharged to:: Skilled nursing facility                 Additional Comments: [facility], actively participating with therapy per patient report (though unable to provide extensive detail).  At baseline (prior to August, 2017), lived with daughter in 2-level home with bed/bath on main level; ambulatory for household distances with RW, manual WC for longer-community distances.    Prior Function Level of Independence: Independent with assistive device(s)         Comments: At baseline, patient reports mod indep with RW; unable to relate most recent functional status at STR (does not remember).  Will verify with family as able     Hand Dominance        Extremity/Trunk Assessment   Upper Extremity Assessment Upper Extremity Assessment:  (R UE globally 3-/5, L UE grossly 2/5 throughout with significant contracture at all joints (hand fisted))   Lower Extremity Assessment Lower Extremity Assessment:  (R LE grossly 3-/5, L LE at least 2/5 with significant increase in tone/stiffness)       Communication  Communication: No difficulties  Cognition Arousal/Alertness: Awake/alert Behavior During Therapy: WFL for tasks assessed/performed Overall Cognitive Status: Difficult to assess                 General Comments: inconsistent presentation    General Comments      Exercises Other Exercises Other Exercises: Supine LE therex, 1x10, act assist ROM: ankle pumps, quad sets, hip abduct/adduct and heel slides.  Very limited ROM (L > R), though bilat ankles able to reach neutral without difficulty Other Exercises: Rolling bilat, total  assist +2 for management of bowel incontinence; dep +2 for hygiene, linen change  Pillow case placed within L fist to protect skin on palmar surface of hand; educated patient on importance of keeping nails trimmed and protecting skin within hand to prevent breakdown.  PAtient voiced understanding; will continue to reinforce as needed.    Assessment/Plan    PT Assessment Patient needs continued PT services  PT Problem List Decreased strength;Decreased range of motion;Decreased activity tolerance;Decreased balance;Decreased mobility;Decreased cognition;Decreased coordination;Decreased knowledge of use of DME;Decreased safety awareness;Decreased knowledge of precautions;Pain;Obesity;Decreased skin integrity;Cardiopulmonary status limiting activity          PT Treatment Interventions DME instruction;Gait training;Functional mobility training;Therapeutic activities;Therapeutic exercise;Balance training;Neuromuscular re-education;Patient/family education    PT Goals (Current goals can be found in the Care Plan section)  Acute Rehab PT Goals PT Goal Formulation: Patient unable to participate in goal setting Time For Goal Achievement: 11/08/16 Potential to Achieve Goals: Fair Additional Goals Additional Goal #1: Assess and establish goals for OOB as appropriate.    Frequency Min 2X/week   Barriers to discharge        Co-evaluation               End of Session   Activity Tolerance: Patient limited by fatigue Patient left: in bed;with call bell/phone within reach           Time: 1441-1514 PT Time Calculation (min) (ACUTE ONLY): 33 min   Charges:   PT Evaluation $PT Eval Moderate Complexity: 1 Procedure PT Treatments $Therapeutic Activity: 8-22 mins   PT G Codes:        Mikayla [name], PT, DPT, NCS 10/25/16, 5:31 PM [phone]

## 2016-10-25 NOTE — Progress Notes (Signed)
Mikayla [name] is a 75 y.o. female  [id number]  Primary Cardiologist: Adrian BlackwaterShaukat Rontrell Moquin Reason for Consultation: Atrial fibrillation  HPI: This is a 75 year old female with a history of atrial fibrillation and empyema hypertension C. difficile colitis presented to the emergency room with altered mental status and was found to have elevated white count and some atrial fibrillation with rapid ventricular response rate.   Review of Systems: Patient is unable to give any history   Past Medical History:  Diagnosis Date  . Atrial fibrillation (HCC)   . Empyema [facility](HCC)    in [facility] Aug 2017- stayed in hospital on for 2 months.  . Hypertension     Medications Prior to Admission  Medication Sig Dispense Refill  . acetaminophen (TYLENOL) 500 MG tablet Take 1,000 mg by mouth every 8 (eight) hours as needed for moderate pain or fever.    Marland Kitchen. amLODipine (NORVASC) 10 MG tablet Take 10 mg by mouth daily.    . carbidopa-levodopa (SINEMET IR) 10-100 MG tablet Take 1 tablet by mouth 3 (three) times daily.    . carvedilol (COREG) 12.5 MG tablet Take 12.5 mg by mouth 2 (two) times daily with a meal.    . celecoxib (CELEBREX) 100 MG capsule Take 100 mg by mouth 2 (two) times daily.    . furosemide (LASIX) 20 MG tablet Take 20 mg by mouth.    . gabapentin (NEURONTIN) 300 MG capsule Take 300 mg by mouth 3 (three) times daily.    Marland Kitchen. ipratropium-albuterol (DUONEB) 0.5-2.5 (3) MG/3ML SOLN Take 3 mLs by nebulization 3 (three) times daily.    . Melatonin 5 MG TABS Take 5 mg by mouth at bedtime.    Marland Kitchen. omeprazole (PRILOSEC OTC) 20 MG tablet Take 20 mg by mouth 2 (two) times daily.    Marland Kitchen. omeprazole (PRILOSEC) 20 MG capsule Take 20 mg by mouth 2 (two) times daily before a meal.    . PARoxetine (PAXIL) 30 MG tablet Take 30 mg by mouth daily.     . potassium chloride SA (K-DUR,KLOR-CON) 20 MEQ tablet Take 20 mEq by mouth daily.    . sodium chloride (OCEAN) 0.65 % SOLN nasal spray Place 1 spray into both nostrils every 2  (two) hours as needed for congestion (allergies).    . traZODone (DESYREL) 50 MG tablet Take 25 mg by mouth at bedtime.    Marland Kitchen. warfarin (COUMADIN) 4 MG tablet Take 4 mg by mouth daily.    . ciprofloxacin (CIPRO) 500 MG tablet Take 1 tablet (500 mg total) by mouth 2 (two) times daily. (Patient not taking: Reported on 10/23/2016) 20 tablet 0  . metroNIDAZOLE (FLAGYL) 500 MG tablet Take 1 tablet (500 mg total) by mouth every 8 (eight) hours. For 10 days (Patient not taking: Reported on 10/23/2016) 30 tablet 0     . amLODipine  10 mg Oral Daily  . carbidopa-levodopa  1 tablet Oral TID  . carvedilol  12.5 mg Oral BID WC  . chlorhexidine  15 mL Mouth Rinse BID  . gabapentin  300 mg Oral TID  . ipratropium-albuterol  3 mL Nebulization TID  . mouth rinse  15 mL Mouth Rinse q12n4p  . Melatonin  5 mg Oral QHS  . pantoprazole  40 mg Oral Daily  . PARoxetine  30 mg Oral Daily  . piperacillin-tazobactam (ZOSYN)  IV  3.375 g Intravenous Q8H  . potassium chloride SA  20 mEq Oral Daily  . sodium chloride flush  3 mL Intravenous Q12H  .  traZODone  25 mg Oral QHS  . vancomycin  1,500 mg Intravenous Q12H  . Warfarin - Pharmacist Dosing Inpatient   Does not apply q1800    Infusions: . 0.9 % NaCl with KCl 20 mEq / L 75 mL/hr at 10/25/16 0931  . amiodarone 30 mg/hr (10/25/16 0231)    Allergies  Allergen Reactions  . Sulfa Antibiotics Hives    Social History   Social History  . Marital status: Widowed    Spouse name: N/A  . Number of children: N/A  . Years of education: N/A   Occupational History  . Not on file.   Social History Main Topics  . Smoking status: Former Smoker    Types: Cigarettes  . Smokeless tobacco: Former Neurosurgeon     Comment: quit in 2001. smoked for 30 years  . Alcohol use No  . Drug use: No  . Sexual activity: No   Other Topics Concern  . Not on file   Social History Narrative  . No narrative on file    Family History  Problem Relation Age of Onset  .  Hypertension Mother     PHYSICAL EXAM: Vitals:   10/25/16 0825 10/25/16 0900  BP: 115/66 (!) 90/58  Pulse:  (!) 115  Resp:  (!) 24  Temp:       Intake/Output Summary (Last 24 hours) at 10/25/16 1053 Last data filed at 10/25/16 1048  Gross per 24 hour  Intake          4282.52 ml  Output                0 ml  Net          4282.52 ml    General:  Well appearing. No respiratory difficulty HEENT: normal Neck: supple. no JVD. Carotids 2+ bilat; no bruits. No lymphadenopathy or thryomegaly appreciated. Cor: PMI nondisplaced. Regular rate & rhythm. No rubs, gallops or murmurs. Lungs: clear Abdomen: soft, nontender, nondistended. No hepatosplenomegaly. No bruits or masses. Good bowel sounds. Extremities: no cyanosis, clubbing, rash, edema Neuro: alert & oriented x 3, cranial nerves grossly intact. moves all 4 extremities w/o difficulty. Affect pleasant.  ECG: Atrial fibrillation with rapid ventricular response rate and nonspecific ST-T changes  Results for orders placed or performed during the hospital encounter of 10/23/16 (from the past 24 hour(s))  Protime-INR     Status: Abnormal   Collection Time: 10/25/16  5:54 AM  Result Value Ref Range   Prothrombin Time 21.7 (H) 11.4 - 15.2 seconds   INR 1.86    Dg Chest Port 1 View  Result Date: 10/24/2016 CLINICAL DATA:  PICC line on right. Pt unable to be fully supine for image, best image possible EXAM: PORTABLE CHEST 1 VIEW COMPARISON:  10/23/2016 FINDINGS: New right-sided PICC line tip overlies the level of the lower superior vena cava. Patchy infiltrate again and pleural effusion again identified at the right lung base, associated with volume loss. Numerous right rib fractures are again noted. Chronic changes are seen in the left shoulder. There is a small nodular density at the left lung base. There is no pulmonary edema. IMPRESSION: 1. Interval placement of right-sided PICC line, tip overlying the level of superior vena cava. 2.  Stable appearance of atelectasis/consolidation at the right lung base associated with pleural effusion. 3. Right rib fractures. 4. Left lung nodule. As indicated, further characterization of this nodule could be performed with CT. Electronically Signed   By: Norva Pavlov M.D.   On:  10/24/2016 19:03   Dg Chest Port 1 View  Result Date: 10/23/2016 CLINICAL DATA:  Elevated white blood count. EXAM: PORTABLE CHEST 1 VIEW COMPARISON:  Radiograph of September 28, 2016. FINDINGS: Stable cardiomediastinal silhouette. No pneumothorax is noted. Left lung is clear. Atherosclerosis of thoracic aorta is noted. Moderately displaced right rib fractures are again noted. Stable right basilar opacity is noted consistent with atelectasis or scarring. Blunting of right costophrenic sulcus is noted most consistent with scarring. IMPRESSION: Stable moderately displaced right rib fractures. Aortic atherosclerosis. Stable right basilar atelectasis or scarring. Electronically Signed   By: Lupita RaiderJames  Green Jr, M.D.   On: 10/23/2016 16:58     ASSESSMENT AND PLAN: Atrial fibrillation with rapid ventricular response rate and nonspecific ST-T changes with sepsis/empyema. Advise continuing amiodarone drip and we will switch to 400 by mouth twice a day tomorrow. We will get an echocardiogram also.  Mikayla [name] A

## 2016-10-26 LAB — URINE CULTURE

## 2016-10-26 LAB — BASIC METABOLIC PANEL
ANION GAP: 7 (ref 5–15)
BUN: 7 mg/dL (ref 6–20)
CHLORIDE: 106 mmol/L (ref 101–111)
CO2: 27 mmol/L (ref 22–32)
Calcium: 8.1 mg/dL — ABNORMAL LOW (ref 8.9–10.3)
Creatinine, Ser: 0.72 mg/dL (ref 0.44–1.00)
Glucose, Bld: 86 mg/dL (ref 65–99)
POTASSIUM: 3.2 mmol/L — AB (ref 3.5–5.1)
SODIUM: 140 mmol/L (ref 135–145)

## 2016-10-26 LAB — ECHOCARDIOGRAM COMPLETE
Height: 64 in
Weight: 3776 oz

## 2016-10-26 LAB — PREPARE RBC (CROSSMATCH)

## 2016-10-26 LAB — MAGNESIUM: MAGNESIUM: 1.6 mg/dL — AB (ref 1.7–2.4)

## 2016-10-26 LAB — PROTIME-INR
INR: 1.94
PROTHROMBIN TIME: 22.4 s — AB (ref 11.4–15.2)

## 2016-10-26 MED ORDER — AMIODARONE HCL 200 MG PO TABS
400.0000 mg | ORAL_TABLET | Freq: Every day | ORAL | Status: DC
  Administered 2016-10-26 – 2016-10-30 (×5): 400 mg via ORAL
  Filled 2016-10-26 (×5): qty 2

## 2016-10-26 MED ORDER — SODIUM CHLORIDE 0.9 % IV SOLN
Freq: Once | INTRAVENOUS | Status: AC
  Administered 2016-10-26: 13:00:00 via INTRAVENOUS

## 2016-10-26 MED ORDER — CEFTRIAXONE SODIUM-DEXTROSE 1-3.74 GM-% IV SOLR
1.0000 g | INTRAVENOUS | Status: DC
  Administered 2016-10-26 – 2016-10-29 (×4): 1 g via INTRAVENOUS
  Filled 2016-10-26 (×5): qty 50

## 2016-10-26 MED ORDER — MAGNESIUM SULFATE 2 GM/50ML IV SOLN
2.0000 g | Freq: Once | INTRAVENOUS | Status: AC
  Administered 2016-10-26: 2 g via INTRAVENOUS
  Filled 2016-10-26: qty 50

## 2016-10-26 MED ORDER — WARFARIN SODIUM 2.5 MG PO TABS
2.5000 mg | ORAL_TABLET | Freq: Once | ORAL | Status: AC
  Administered 2016-10-26: 2.5 mg via ORAL
  Filled 2016-10-26: qty 1

## 2016-10-26 MED ORDER — DEXTROSE 5 % IV SOLN: 1.0000 g | INTRAVENOUS | Status: DC

## 2016-10-26 MED ORDER — POTASSIUM CHLORIDE 20 MEQ PO PACK
40.0000 meq | PACK | Freq: Once | ORAL | Status: AC
  Administered 2016-10-26: 40 meq via ORAL
  Filled 2016-10-26: qty 2

## 2016-10-26 NOTE — Progress Notes (Signed)
eLink Physician-Brief Progress Note Patient Name: Doristine JohnsCatherine R Weathers DOB: 03/02/1941 MRN: [id number]   Date of Service  10/26/2016  HPI/Events of Note  Pola CornLINK duty is to review micro. Pt was in ICU , now out In for UTI sepsis Urine kleib int res to zosyn, on VOsyn  eICU Interventions  Consider changing to ceftriaxone, dc zosyn I dont see any ceftaz sens pattern or E testing Have paged sound physician doc to discuss / notify     Intervention Category Major Interventions: Infection - evaluation and management  FEINSTEIN,DANIEL J. 10/26/2016, 5:33 PM

## 2016-10-26 NOTE — Progress Notes (Signed)
Report called to 2A-Serenity RN

## 2016-10-26 NOTE — Progress Notes (Addendum)
ANTICOAGULATION CONSULT NOTE - Initial Consult  Pharmacy Consult for warfarin dosing Indication: atrial fibrillation  Allergies  Allergen Reactions  . Sulfa Antibiotics Hives   Patient Measurements: Height: 5\' 4"  (162.6 cm) Weight: 236 lb (107 kg) IBW/kg (Calculated) : 54.7  Vital Signs: Temp: 97.7 F (36.5 C) (01/01 1430) Temp Source: Oral (01/01 1430) BP: 95/54 (01/01 1430) Pulse Rate: 81 (01/01 1430)  Labs:  Recent Labs  10/23/16 1550 10/24/16 0441 10/25/16 0554 10/25/16 1530 10/25/16 2151 10/26/16 0612  HGB 9.4* 8.3*  --  7.1* 6.9*  --   HCT 30.0* 26.0*  --  22.2* 21.5*  --   PLT 509* 367  --  402  --   --   APTT 46*  --   --   --   --   --   LABPROT 22.7*  --  21.7*  --   --  22.4*  INR 1.97  --  1.86  --   --  1.94  CREATININE 0.70 0.69  --  0.66  --  0.72  TROPONINI <0.03  --   --   --   --   --     Estimated Creatinine Clearance: 71.4 mL/min (by C-G formula based on SCr of 0.72 mg/dL).   Medical History: Past Medical History:  Diagnosis Date  . Atrial fibrillation (HCC)   . Empyema [facility](HCC)    in [facility] Aug 2017- stayed in hospital on for 2 months.  . Hypertension     Assessment: Pharmacy consulted to dose and monitor warfarin in this 76 year old female taking warfarin 4 mg PO Daily prior to admission for atrial fibrillation.  INR 1.97 therapeutic on admission. Amiodarone started inpatient, will need to closely monitor for potential drug interaction which can enhance the effects of warfarin, although reaction is usually delayed.  Dosing history: Date INR Dose 12/29 1.97 12/30 -- 2.5 mg 12/31 1.86 No dose ordered 1/1 1.94   Goal of Therapy:  INR 2-3  Plan:  INR is slightly subtherapeutic at 1.94. Patient missed dose of warfarin last night.  Will order warfarin 2.5 mg PO dose tonight (slightly reduced from home dose given start of amiodarone). Discussed decreasing hemoglobin with MD - per MD will continue warfarin for now.  Will recheck INR  tomorrow morning.  Cindi CarbonMary M Devina Bezold, PharmD, BCPS Clinical Pharmacist 10/26/2016 3:04 PM

## 2016-10-26 NOTE — Progress Notes (Signed)
SUBJECTIVE: Patient more alert now   Vitals:   10/26/16 0900 10/26/16 1000 10/26/16 1007 10/26/16 1100  BP: (!) 104/59 117/78 117/78 131/78  Pulse: 82 80  92  Resp: (!) 3 11  17   Temp:      TempSrc:      SpO2: 100% 100%    Weight:      Height:        Intake/Output Summary (Last 24 hours) at 10/26/16 1230 Last data filed at 10/26/16 [id number]  Gross per 24 hour  Intake          2444.22 ml  Output                0 ml  Net          2444.22 ml    LABS: Basic Metabolic Panel:  Recent Labs  19/14/7812/31/17 1530 10/26/16 0612  NA 136 140  K 4.6 3.2*  CL 105 106  CO2 24 27  GLUCOSE 94 86  BUN 6 7  CREATININE 0.66 0.72  CALCIUM 7.4* 8.1*  MG 1.5* 1.6*   Liver Function Tests:  Recent Labs  10/23/16 1550  AST 14*  ALT <5*  ALKPHOS 62  BILITOT 0.6  PROT 6.6  ALBUMIN 2.3*    Recent Labs  10/23/16 1550  LIPASE <10*   CBC:  Recent Labs  10/23/16 1550 10/24/16 0441 10/25/16 1530 10/25/16 2151  WBC 16.1* 15.7* 9.3  --   NEUTROABS 12.4*  --   --   --   HGB 9.4* 8.3* 7.1* 6.9*  HCT 30.0* 26.0* 22.2* 21.5*  MCV 71.3* 70.0* 70.8*  --   PLT 509* 367 402  --    Cardiac Enzymes:  Recent Labs  10/23/16 1550  TROPONINI <0.03   BNP: Invalid input(s): POCBNP D-Dimer: No results for input(s): DDIMER in the last 72 hours. Hemoglobin A1C: No results for input(s): HGBA1C in the last 72 hours. Fasting Lipid Panel: No results for input(s): CHOL, HDL, LDLCALC, TRIG, CHOLHDL, LDLDIRECT in the last 72 hours. Thyroid Function Tests: No results for input(s): TSH, T4TOTAL, T3FREE, THYROIDAB in the last 72 hours.  Invalid input(s): FREET3 Anemia Panel: No results for input(s): VITAMINB12, FOLATE, FERRITIN, TIBC, IRON, RETICCTPCT in the last 72 hours.   PHYSICAL EXAM General: Well developed, well nourished, in no acute distress HEENT:  Normocephalic and atramatic Neck:  No JVD.  Lungs: Clear bilaterally to auscultation and percussion. Heart: HRRR . Normal S1 and S2  without gallops or murmurs.  Abdomen: Bowel sounds are positive, abdomen soft and non-tender  Msk:  Back normal, normal gait. Normal strength and tone for age. Extremities: No clubbing, cyanosis or edema.   Neuro: Alert and oriented X 3. Psych:  Good affect, responds appropriately  TELEMETRY: Sinus rhythm   ASSESSMENT AND PLAN: Sepsis/pneumonia now more alert and in sinus rhythm. May switch to by mouth amiodarone.  Active Problems:   Sepsis (HCC)    Adrian BlackwaterKHAN,Paidyn Mcferran A, MD, [facility] CenterFACC 10/26/2016 12:30 PM

## 2016-10-26 NOTE — Progress Notes (Signed)
Pt remains a flutter on the cardiac monitor and remains alert and oriented.  Lungs are clear and pt wears 2L nasal canula when she is sleeping.  Hemoglobin level was drawn during the night and it resulted at 6.9.  Prime MD was made aware and an order was placed for 1 unit of PRBCs.  Prime MD spoke with pt concerning the transfusion and initially pt was in agreement to receive the unit.  But within a few minutes, pt was calling me into the room to let me know that she had changed her mind.  I educated the pt on the benefits of receiving the unit of PRBCs but pt was very apprehensive.  Pt then asked to speak to both of her daughters concerning blood transfusion and so they were phoned and they spoke with her as well.   Pt still wanted to take time to think about receiving the transfusion.  Prime MD was notified and the order was cancelled for the time being and will be readdressed in the morning.

## 2016-10-26 NOTE — Progress Notes (Addendum)
[facility] [place] [facility]   PATIENT NAME: Mikayla [name]    MR#:  [id number]  DATE OF BIRTH:  [birth year]  SUBJECTIVE:  CHIEF COMPLAINT:   Chief Complaint  Patient presents with  . Abnormal Lab   - appears much better today, slow decline in Hemoglobin. Transfusion advised last night but patient refused. This morning she is more agreeable. -Heart rate is much better. We'll discontinue the amiodarone drip.  REVIEW OF SYSTEMS:  Review of Systems  Constitutional: Positive for malaise/fatigue. Negative for chills, fever and weight loss.  HENT: Negative for ear discharge, hearing loss and nosebleeds.   Eyes: Negative for blurred vision, double vision and photophobia.  Respiratory: Negative for cough, hemoptysis, shortness of breath and wheezing.   Cardiovascular: Positive for palpitations. Negative for chest pain, orthopnea and leg swelling.  Gastrointestinal: Negative for abdominal pain, constipation, diarrhea, heartburn, melena, nausea and vomiting.  Genitourinary: Negative for dysuria.  Musculoskeletal: Positive for back pain, joint pain and myalgias. Negative for neck pain.  Skin: Negative for rash.  Neurological: Negative for dizziness, speech change, focal weakness and headaches.  Endo/Heme/Allergies: Does not bruise/bleed easily.  Psychiatric/Behavioral: Negative for depression.    DRUG ALLERGIES:   Allergies  Allergen Reactions  . Sulfa Antibiotics Hives    VITALS:  Blood pressure 91/68, pulse 85, temperature 97.9 F (36.6 C), resp. rate (!) 9, height 5\' 4"  (1.626 m), weight 107 kg (236 lb), SpO2 100 %.  PHYSICAL EXAMINATION:  Physical Exam  GENERAL:  76 y.o.-year-old Obese patient lying in the bed with no acute distress. Appears contracted EYES: Pupils equal, round, reactive to light and accommodation. No scleral icterus. Extraocular muscles intact.  HEENT: Head atraumatic, normocephalic. Oropharynx and nasopharynx clear.  NECK:   Supple, no jugular venous distention. No thyroid enlargement, no tenderness.  LUNGS: Normal breath sounds bilaterally, no wheezing, rales,rhonchi or crepitation. No use of accessory muscles of respiration. Decreased bibasilar breath sounds. CARDIOVASCULAR: S1, S2 normal. No murmurs, rubs, or gallops.  ABDOMEN: Soft, nontender, nondistended. Bowel sounds present. No organomegaly or mass.  EXTREMITIES: No pedal edema, cyanosis, or clubbing.  NEUROLOGIC: Cranial nerves II through XII are intact. Following simple commands. weaker on the left side and contracted left hand. Has significant arthritis. PSYCHIATRIC: The patient is alert and oriented x 3.  SKIN: No obvious rash, lesion, or ulcer.    LABORATORY PANEL:   CBC  Recent Labs Lab 10/25/16 1530 10/25/16 2151  WBC 9.3  --   HGB 7.1* 6.9*  HCT 22.2* 21.5*  PLT 402  --    ------------------------------------------------------------------------------------------------------------------  Chemistries   Recent Labs Lab 10/23/16 1550  10/26/16 0612  NA 134*  < > 140  K 3.4*  < > 3.2*  CL 97*  < > 106  CO2 29  < > 27  GLUCOSE 90  < > 86  BUN 8  < > 7  CREATININE 0.70  < > 0.72  CALCIUM 8.6*  < > 8.1*  MG  --   < > 1.6*  AST 14*  --   --   ALT <5*  --   --   ALKPHOS 62  --   --   BILITOT 0.6  --   --   < > = values in this interval not displayed. ------------------------------------------------------------------------------------------------------------------  Cardiac Enzymes  Recent Labs Lab 10/23/16 1550  TROPONINI <0.03   ------------------------------------------------------------------------------------------------------------------  RADIOLOGY:  Dg Chest Port 1 View  Result Date: 10/24/2016 CLINICAL DATA:  PICC line on  right. Pt unable to be fully supine for image, best image possible EXAM: PORTABLE CHEST 1 VIEW COMPARISON:  10/23/2016 FINDINGS: New right-sided PICC line tip overlies the level of the lower  superior vena cava. Patchy infiltrate again and pleural effusion again identified at the right lung base, associated with volume loss. Numerous right rib fractures are again noted. Chronic changes are seen in the left shoulder. There is a small nodular density at the left lung base. There is no pulmonary edema. IMPRESSION: 1. Interval placement of right-sided PICC line, tip overlying the level of superior vena cava. 2. Stable appearance of atelectasis/consolidation at the right lung base associated with pleural effusion. 3. Right rib fractures. 4. Left lung nodule. As indicated, further characterization of this nodule could be performed with CT. Electronically Signed   By: Norva PavlovElizabeth  Brown M.D.   On: 10/24/2016 19:03    EKG:   Orders placed or performed during the hospital encounter of 10/23/16  . EKG 12-Lead  . EKG 12-Lead  . EKG 12-Lead  . EKG 12-Lead    ASSESSMENT AND PLAN:   CatherineNoblittis a 76 y.o.femalewith a known history of A. fib, empyema, hypertension and C. difficile colitis. The patient was sent from nursing home due to abnormal labs and tachycardia.  #1 sepsis-secondary to urinary tract infection. Pending urine cultures at this time. -On vancomycin and Zosyn. No fevers. Continue to follow WBC and await culture results. No blood cultures were done on admission(blood cultires BCID from 09/28/16 growing MRSA)- blood cultures ordered now -Once cultures are available, can de-escalate the antibiotics. -Chest x-ray with no evidence of infection. No recurrence of empyema noted.  #2 atrial fibrillation with rapid ventricular response- -Appreciate cardiology consult. Patient on amiodarone drip- change to oral amiodarone today. Also on Coreg if blood pressure can accommodate. -One dose of IV digoxin given yesterday. -Echocardiogram is ordered. - Coumadin for anticoagulation.  #3 hypokalemia and hypomagnesemia-replaced and follow-up  #4 anemia of chronic disease-secondary to  recent hospitalizations. Since hb <7 and with tachycardia- will Tx 1 unit today. Patient agreeable now. No active bleeding. Monitor on coumadin  #5 Parkinson's disease-continue Sinemet.  #6 arthritis-on gabapentin and Celebrex.  #7 DVT prophylaxis-on Coumadin Monitor if hb continues to drop  Physical therapy and occupational therapy consults Transfer to telemetry floor    All the records are reviewed and case discussed with Care Management/Social Workerr. Management plans discussed with the patient, family and they are in agreement.  CODE STATUS: DNR  TOTAL TIME TAKING CARE OF THIS PATIENT: 38 minutes.   POSSIBLE D/C IN 2-3 DAYS, DEPENDING ON CLINICAL CONDITION.   Enid BaasKALISETTI,Amaree Leeper M.D on 10/26/2016 at 2:23 PM  Between 7am to 6pm - Pager - [phone]  After 6pm go to [email or web address] - password [email or web address]  [facility]  Office  [phone]  CC: Primary care physician; Pcp Not In System

## 2016-10-27 LAB — CBC
HEMATOCRIT: 25.6 % — AB (ref 35.0–47.0)
HEMOGLOBIN: 8.2 g/dL — AB (ref 12.0–16.0)
MCH: 23 pg — ABNORMAL LOW (ref 26.0–34.0)
MCHC: 31.8 g/dL — ABNORMAL LOW (ref 32.0–36.0)
MCV: 72.2 fL — AB (ref 80.0–100.0)
Platelets: 439 10*3/uL (ref 150–440)
RBC: 3.55 MIL/uL — ABNORMAL LOW (ref 3.80–5.20)
RDW: 21.6 % — AB (ref 11.5–14.5)
WBC: 7.5 10*3/uL (ref 3.6–11.0)

## 2016-10-27 LAB — PROTIME-INR
INR: 1.92
Prothrombin Time: 22.2 seconds — ABNORMAL HIGH (ref 11.4–15.2)

## 2016-10-27 LAB — TYPE AND SCREEN
Blood Product Expiration Date: 201801152359
ISSUE DATE / TIME: 201801011227
UNIT TYPE AND RH: 6200

## 2016-10-27 LAB — BASIC METABOLIC PANEL
Anion gap: 4 — ABNORMAL LOW (ref 5–15)
BUN: 6 mg/dL (ref 6–20)
CHLORIDE: 108 mmol/L (ref 101–111)
CO2: 26 mmol/L (ref 22–32)
Calcium: 8.3 mg/dL — ABNORMAL LOW (ref 8.9–10.3)
Creatinine, Ser: 0.78 mg/dL (ref 0.44–1.00)
GFR calc Af Amer: >60 mL/min (ref >60)
GFR calc non Af Amer: >60 mL/min (ref >60)
Glucose, Bld: 100 mg/dL — ABNORMAL HIGH (ref 65–99)
POTASSIUM: 3.9 mmol/L (ref 3.5–5.1)
SODIUM: 138 mmol/L (ref 135–145)

## 2016-10-27 LAB — MAGNESIUM: Magnesium: 1.9 mg/dL (ref 1.7–2.4)

## 2016-10-27 MED ORDER — FUROSEMIDE 40 MG PO TABS
40.0000 mg | ORAL_TABLET | Freq: Once | ORAL | Status: AC
  Administered 2016-10-27: 40 mg via ORAL

## 2016-10-27 MED ORDER — FUROSEMIDE 40 MG PO TABS
40.0000 mg | ORAL_TABLET | Freq: Two times a day (BID) | ORAL | Status: DC
Start: 2016-10-27 — End: 2016-10-29
  Administered 2016-10-28 – 2016-10-29 (×3): 40 mg via ORAL
  Filled 2016-10-27 (×4): qty 1

## 2016-10-27 MED ORDER — JUVEN PO PACK
1.0000 | PACK | Freq: Two times a day (BID) | ORAL | Status: DC
  Administered 2016-10-27 – 2016-10-29 (×5): 1 via ORAL

## 2016-10-27 MED ORDER — WARFARIN SODIUM 2.5 MG PO TABS
2.5000 mg | ORAL_TABLET | Freq: Once | ORAL | Status: AC
  Administered 2016-10-27: 2.5 mg via ORAL
  Filled 2016-10-27: qty 1

## 2016-10-27 MED ORDER — CARVEDILOL 6.25 MG PO TABS
6.2500 mg | ORAL_TABLET | Freq: Two times a day (BID) | ORAL | Status: DC
  Administered 2016-10-27 – 2016-10-28 (×3): 6.25 mg via ORAL
  Filled 2016-10-27 (×3): qty 1

## 2016-10-27 NOTE — Progress Notes (Signed)
Initial Nutrition Assessment  DOCUMENTATION CODES:   Obesity unspecified  INTERVENTION:  1. Magic cup TID with meals, each supplement provides 290 kcal and 9 grams of protein 2. Juven 1 packet BID, each supplement provides 14gm protein and 80 calories  NUTRITION DIAGNOSIS:   Inadequate oral intake related to lethargy/confusion, poor appetite as evidenced by per patient/family report, other (see comment) (skipped lunch).  GOAL:   Patient will meet greater than or equal to 90% of their needs  MONITOR:   PO intake, Supplement acceptance, I & O's, Labs, Weight trends, Skin  REASON FOR ASSESSMENT:   Consult Poor PO  ASSESSMENT:   Mikayla [name]  is a 76 y.o. female with a known history of A. fib, empyema, hypertension and C. difficile colitis. The patient was sent from nursing home to the ED due to above chief complaints  Spoke with pt's Daughter at bedside. Pt was lethargic, opened eyes once. Had scrambled eggs and toast this morning she ate ~50% of Did not order any lunch, wasn't hungry. Daughter reports patient normally does well, [facility]. Denies any nausea or vomiting or issues chewing/swallowing. Weight appears to be stable. Likes magic cup and is agreeable to Juven for wound healing as well. Will monitor.  Nutrition-Focused physical exam completed. Findings are no fat depletion, no muscle depletion, and moderate edema.   Labs and medications reviewed: KCL 20mEq daily, Warfarin, Amiodarone PO NS w/ KCL 20mEq @ 4060mL/hr  Diet Order:  Diet Heart Room service appropriate? Yes; Fluid consistency: Thin  Skin:   STG II to Coccyx  Last BM:  10/26/2016  Height:   Ht Readings from Last 1 Encounters:  10/23/16 5\' 4"  (1.626 m)    Weight:   Wt Readings from Last 1 Encounters:  10/23/16 236 lb (107 kg)    Ideal Body Weight:  54.54 kg  BMI:  Body mass index is 40.51 kg/m.  Estimated Nutritional Needs:   Kcal:  1800-2100  calories  Protein:  134-150 gm  Fluid:  >/= 1.8L  EDUCATION NEEDS:   No education needs identified at this time  Dionne AnoWilliam M. Rayme Bui, MS, RD LDN Inpatient Clinical Dietitian Pager [phone]

## 2016-10-27 NOTE — Evaluation (Signed)
Occupational Therapy Evaluation Patient Details Name: Mikayla [name] MRN: [id number] DOB: 03/05/1941 Today's Date: 10/27/2016    History of Present Illness Pt. was admitted to [facility] with Sepsis related to a UTI, AFib, and RVR requiring an amio drip   Clinical Impression   Pt. Is a 76 y.o. Female who was [facility] with  Sepsis related to a UTI. Pt. Presents with pain, decreased functional mobility for ADLs, limited LUE ROM, and  Weakness. Pt. Could benefit from skilled OT services for ADL training, UE there. Ex, and pt. Education about LUE care, and energy conservation. Pt. Plans to return to SNF upon discharge. Pt. could benefit from follow up OT services upon return to SNF.    Follow Up Recommendations  SNF    Equipment Recommendations       Recommendations for Other Services       Precautions / Restrictions                                                       ADL Overall ADL's : Needs assistance/impaired     Grooming: Set up;Moderate assistance           Upper Body Dressing : Maximal assistance   Lower Body Dressing: Total assistance               Functional mobility during ADLs: Total assistance General ADL Comments: Pt. education was provided about left hand hygiene/care     Vision     Perception     Praxis      Pertinent Vitals/Pain       Hand Dominance Right   Extremity/Trunk Assessment Upper Extremity Assessment Upper Extremity Assessment: LUE deficits/detail (RUE shoulder flexion, abduction 3-/5, elbow flexion 3/5, extension 3+/5. Right Grip strength: 10# ) LUE Deficits / Details: LUE 2-/5 propximally, 2/5 distally. Pt. 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.      Prior Functioning/Environment Level of Independence: Independent with assistive device(s)                 OT Problem List: Decreased strength;Decreased range of motion;Decreased activity tolerance;Impaired UE functional use;Decreased knowledge of use of DME or AE   OT Treatment/Interventions: Self-care/ADL training;Therapeutic exercise;Neuromuscular education;DME and/or AE instruction;Patient/family education;Therapeutic activities    OT Goals(Current goals can be found in the care plan section) Acute Rehab OT Goals Patient Stated Goal: To regain independence OT Goal Formulation: With patient Potential to Achieve Goals: Good  OT Frequency: Min 1X/week   Barriers to D/C:  Co-evaluation              End of Session    Activity Tolerance: Patient tolerated treatment well Patient left: in bed;with call bell/phone within reach;with bed alarm set   Time: 0950-1015 OT Time Calculation (min): 25 min Charges:  OT General Charges $OT Visit: 1 Procedure OT Evaluation $OT Eval Moderate Complexity: 1 Procedure G-Codes:    Olegario Messier, MS, OTR/L 10/27/2016, 10:48 AM

## 2016-10-27 NOTE — Progress Notes (Signed)
SUBJECTIVE: Patient appears  To be comfortable   Vitals:   10/26/16 1615 10/26/16 1949 10/27/16 0527 10/27/16 0748  BP: (!) 103/45 100/72 113/76   Pulse: 83 (!) 114 96   Resp: 20 16 14    Temp: 98.6 F (37 C) 98.2 F (36.8 C) 98.8 F (37.1 C)   TempSrc: Axillary Oral Oral   SpO2: 100% 96% 100% 98%  Weight:      Height:        Intake/Output Summary (Last 24 hours) at 10/27/16 1036 Last data filed at 10/27/16 0700  Gross per 24 hour  Intake           1723.2 ml  Output                0 ml  Net           1723.2 ml    LABS: Basic Metabolic Panel:  Recent Labs  13/05/6500/01/18 0612 10/27/16 0500  NA 140 138  K 3.2* 3.9  CL 106 108  CO2 27 26  GLUCOSE 86 100*  BUN 7 6  CREATININE 0.72 0.78  CALCIUM 8.1* 8.3*  MG 1.6* 1.9   Liver Function Tests: No results for input(s): AST, ALT, ALKPHOS, BILITOT, PROT, ALBUMIN in the last 72 hours. No results for input(s): LIPASE, AMYLASE in the last 72 hours. CBC:  Recent Labs  10/25/16 1530 10/25/16 2151 10/27/16 0500  WBC 9.3  --  7.5  HGB 7.1* 6.9* 8.2*  HCT 22.2* 21.5* 25.6*  MCV 70.8*  --  72.2*  PLT 402  --  439   Cardiac Enzymes: No results for input(s): CKTOTAL, CKMB, CKMBINDEX, TROPONINI in the last 72 hours. BNP: Invalid input(s): POCBNP D-Dimer: No results for input(s): DDIMER in the last 72 hours. Hemoglobin A1C: No results for input(s): HGBA1C in the last 72 hours. Fasting Lipid Panel: No results for input(s): CHOL, HDL, LDLCALC, TRIG, CHOLHDL, LDLDIRECT in the last 72 hours. Thyroid Function Tests: No results for input(s): TSH, T4TOTAL, T3FREE, THYROIDAB in the last 72 hours.  Invalid input(s): FREET3 Anemia Panel: No results for input(s): VITAMINB12, FOLATE, FERRITIN, TIBC, IRON, RETICCTPCT in the last 72 hours.   PHYSICAL EXAM General: Well developed, well nourished, in no acute distress HEENT:  Normocephalic and atramatic Neck:  No JVD.  Lungs: Clear bilaterally to auscultation and  percussion. Heart: HRRR . Normal S1 and S2 without gallops or murmurs.  Abdomen: Bowel sounds are positive, abdomen soft and non-tender  Msk:  Back normal, normal gait. Normal strength and tone for age. Extremities: No clubbing, cyanosis or edema.   Neuro: Alert and oriented X 3. Psych:  Good affect, responds appropriately  TELEMETRY: NSR  ASSESSMENT AND PLAN: Sepsis/Pneumonia and atrial fibrillation. Switched to po amiodrone and remains in NSR. Active Problems:   Sepsis (HCC)    Laurier NancyKHAN,Jazmynn Pho A, MD, [facility] 10/27/2016 10:36 AM

## 2016-10-27 NOTE — Care Management Important Message (Signed)
Important Message  Patient Details  Name: Mikayla [name] MRN: [id number] Date of Birth: 08/22/1941   Medicare Important Message Given:  Yes Initial signed IM printed from Epic and given to patient.    Eber HongGreene, Pal Shell R, RN 10/27/2016, 9:26 AM

## 2016-10-27 NOTE — Clinical Social Work Note (Addendum)
CSW met with patient to discuss returning back to [facility] where patient is a long term care resident.  CSW was informed by patient that she is happy with the care that she is providing, and she does not have any issues about returning back to SNF.  CSW received phone call from patient's daughter that they are interested in having patient move to a different SNF which is closer to where they live.  Patient's daughter was explained what the process would be if they decide to move patient.  Patient's daughter requested that CSW contact [facility] in [place] to see if they would have any long term care beds available.  CSW explained to patient's daughter that if the facility they prefer does not have any beds available, patient will have to return back to [facility], patient's daughter expressed understanding.  CSW to contact other SNF on Wednesday to discuss having patient transfer.  CSW to continue to follow patient's progress throughout discharge planning.  Formal assessment was completed by weekend Education officer, museum.  Jones Broom. [place], MSW, [place]  Mon-Fri 8a-4:30p 10/27/2016 6:12 PM

## 2016-10-27 NOTE — Progress Notes (Signed)
[employer] [facility] [place] at [facility]   PATIENT NAME: Mikayla [name]    MR#:  [id number]  DATE OF BIRTH:  11/09/1940  SUBJECTIVE:  CHIEF COMPLAINT:   Chief Complaint  Patient presents with  . Abnormal Lab   Awake. Daughter at bedside  REVIEW OF SYSTEMS:  Review of Systems  Constitutional: Positive for malaise/fatigue. Negative for chills, fever and weight loss.  HENT: Negative for ear discharge, hearing loss and nosebleeds.   Eyes: Negative for blurred vision, double vision and photophobia.  Respiratory: Negative for cough, hemoptysis, shortness of breath and wheezing.   Cardiovascular: Positive for palpitations. Negative for chest pain, orthopnea and leg swelling.  Gastrointestinal: Negative for abdominal pain, constipation, diarrhea, heartburn, melena, nausea and vomiting.  Genitourinary: Negative for dysuria.  Musculoskeletal: Positive for back pain, joint pain and myalgias. Negative for neck pain.  Skin: Negative for rash.  Neurological: Negative for dizziness, speech change, focal weakness and headaches.  Endo/Heme/Allergies: Does not bruise/bleed easily.  Psychiatric/Behavioral: Negative for depression.    DRUG ALLERGIES:   Allergies  Allergen Reactions  . Sulfa Antibiotics Hives    VITALS:  Blood pressure 113/76, pulse (!) 126, temperature 97.4 F (36.3 C), temperature source Oral, resp. rate 16, height 5\' 4"  (1.626 m), weight 107 kg (236 lb), SpO2 93 %.  PHYSICAL EXAMINATION:  Physical Exam  GENERAL:  76 y.o.-year-old Obese patient lying in the bed with no acute distress. Appears contracted. Morbidly obese EYES: Pupils equal, round, reactive to light and accommodation. No scleral icterus. Extraocular muscles intact.  HEENT: Head atraumatic, normocephalic. Oropharynx and nasopharynx clear.  NECK:  Supple, no jugular venous distention. No thyroid enlargement, no tenderness.  LUNGS: Normal breath sounds bilaterally, no wheezing,  rales,rhonchi or crepitation. No use of accessory muscles of respiration. Decreased bibasilar breath sounds. CARDIOVASCULAR: S1, S2 normal. No murmurs, rubs, or gallops.  ABDOMEN: Soft, nontender, nondistended. Bowel sounds present. No organomegaly or mass.  EXTREMITIES: No pedal edema, cyanosis, or clubbing.  NEUROLOGIC: Cranial nerves II through XII are intact. Following simple commands. weaker on the left side and contracted left hand. Has significant arthritis. PSYCHIATRIC: The patient is alert and awake SKIN: Deep clean wound right lateral chest wall  LABORATORY PANEL:   CBC  Recent Labs Lab 10/27/16 0500  WBC 7.5  HGB 8.2*  HCT 25.6*  PLT 439   ------------------------------------------------------------------------------------------------------------------  Chemistries   Recent Labs Lab 10/23/16 1550  10/27/16 0500  NA 134*  < > 138  K 3.4*  < > 3.9  CL 97*  < > 108  CO2 29  < > 26  GLUCOSE 90  < > 100*  BUN 8  < > 6  CREATININE 0.70  < > 0.78  CALCIUM 8.6*  < > 8.3*  MG  --   < > 1.9  AST 14*  --   --   ALT <5*  --   --   ALKPHOS 62  --   --   BILITOT 0.6  --   --   < > = values in this interval not displayed. ------------------------------------------------------------------------------------------------------------------  Cardiac Enzymes  Recent Labs Lab 10/23/16 1550  TROPONINI <0.03   ------------------------------------------------------------------------------------------------------------------  RADIOLOGY:  No results found.  EKG:   Orders placed or performed during the hospital encounter of 10/23/16  . EKG 12-Lead  . EKG 12-Lead  . EKG 12-Lead  . EKG 12-Lead    ASSESSMENT AND PLAN:   CatherineNoblittis a 76 y.o.femalewith a known history of A. fib, empyema,  hypertension and C. difficile colitis. The patient was sent from nursing home due to abnormal labs and tachycardia.  # Acute on chronic systolic CHF With acute hypoxic  respiratory failure - IV Lasix, Beta blockers - Input and Output - Monitor Bun/Cr and Potassium  # Sepsis-secondary to urinary tract infection, klebsiella - On Ceftriaxone -Chest x-ray with no evidence of infection. No recurrence of empyema noted.  # Atrial fibrillation with rapid ventricular response. - Appreciate cardiology consult. Patient on amiodarone drip- change to oral amiodarone today. Also on Coreg  - Echocardiogram showed EF 50% with hypokinesis - Coumadin for anticoagulation.  # Hypokalemia and hypomagnesemia Replaced  # Anemia of chronic disease 1 unit transfused this admission.  # Parkinson's disease-continue Sinemet.  # Arthritis-on gabapentin and Celebrex.  # DVT prophylaxis-on Coumadin.  All the records are reviewed and case discussed with Care Management/Social Worker. Management plans discussed with the patient, family and they are in agreement.  CODE STATUS: DNR  TOTAL TIME TAKING CARE OF THIS PATIENT: 35 minutes.   POSSIBLE D/C IN 1-2 DAYS, DEPENDING ON CLINICAL CONDITION.  Milagros Loll R M.D on 10/27/2016 at 3:46 PM  Between 7am to 6pm - Pager - [phone]  After 6pm go to [email or web address] - password [employer]  [facility] [place] [facility]  Office  [phone]  CC: Primary care physician; Pcp Not In System

## 2016-10-27 NOTE — Progress Notes (Signed)
Dressing changed on right shoulder. Dressing had dried yellow/brown drainage. Two wound present. Dressed with wet gauze, abd pad placed and taped on 3 side. Sacral wound is on left buttock approx. 1 cm round, cleansed with normal saline and foam applied. Pt tolerated procedure. I will continue to assess.

## 2016-10-27 NOTE — Progress Notes (Signed)
ANTICOAGULATION CONSULT NOTE - Initial Consult  Pharmacy Consult for warfarin dosing Indication: atrial fibrillation  Allergies  Allergen Reactions  . Sulfa Antibiotics Hives   Patient Measurements: Height: 5\' 4"  (162.6 cm) Weight: 236 lb (107 kg) IBW/kg (Calculated) : 54.7  Vital Signs: Temp: 97.4 F (36.3 C) (01/02 1108) Temp Source: Oral (01/02 1108) BP: 113/76 (01/02 1108) Pulse Rate: 126 (01/02 1108)  Labs:  Recent Labs  10/25/16 0554  10/25/16 1530 10/25/16 2151 10/26/16 0612 10/27/16 0500  HGB  --   < > 7.1* 6.9*  --  8.2*  HCT  --   --  22.2* 21.5*  --  25.6*  PLT  --   --  402  --   --  439  LABPROT 21.7*  --   --   --  22.4* 22.2*  INR 1.86  --   --   --  1.94 1.92  CREATININE  --   --  0.66  --  0.72 0.78  < > = values in this interval not displayed.  Estimated Creatinine Clearance: 71.4 mL/min (by C-G formula based on SCr of 0.78 mg/dL).   Medical History: Past Medical History:  Diagnosis Date  . Atrial fibrillation (HCC)   . Empyema [facility](HCC)    in [facility] Aug 2017- stayed in hospital on for 2 months.  . Hypertension     Assessment: Pharmacy consulted to dose and monitor warfarin in this 76 year old female taking warfarin 4 mg PO Daily prior to admission for atrial fibrillation.  INR 1.97 therapeutic on admission. Amiodarone started inpatient, will need to closely monitor for potential drug interaction which can enhance the effects of warfarin, although reaction is usually delayed.  Dosing history: Date INR Dose 12/29 1.97 12/30 -- 2.5 mg 12/31 1.86 No dose ordered 1/1 1.94 2.5 mg 1/2       1.92  Goal of Therapy:  INR 2-3  Plan:  INR is slightly subtherapeutic at 1.92. Patient missed dose of warfarin 12/31.  Will order warfarin 2.5 mg PO dose tonight (slightly reduced from home dose given start of amiodarone).  Will recheck INR tomorrow morning.  Clovia CuffLisa Latha Staunton, PharmD, BCPS 10/27/2016 12:16 PM

## 2016-10-28 LAB — BASIC METABOLIC PANEL
ANION GAP: 4 — AB (ref 5–15)
BUN: 5 mg/dL — AB (ref 6–20)
CHLORIDE: 105 mmol/L (ref 101–111)
CO2: 30 mmol/L (ref 22–32)
Calcium: 8.2 mg/dL — ABNORMAL LOW (ref 8.9–10.3)
Creatinine, Ser: 0.73 mg/dL (ref 0.44–1.00)
GFR calc Af Amer: >60 mL/min (ref >60)
GLUCOSE: 107 mg/dL — AB (ref 65–99)
POTASSIUM: 3.4 mmol/L — AB (ref 3.5–5.1)
SODIUM: 139 mmol/L (ref 135–145)

## 2016-10-28 LAB — PROTIME-INR
INR: 1.97
Prothrombin Time: 22.7 seconds — ABNORMAL HIGH (ref 11.4–15.2)

## 2016-10-28 MED ORDER — DILTIAZEM HCL 30 MG PO TABS
30.0000 mg | ORAL_TABLET | Freq: Three times a day (TID) | ORAL | Status: DC
  Administered 2016-10-28 – 2016-10-29 (×4): 30 mg via ORAL
  Filled 2016-10-28 (×4): qty 1

## 2016-10-28 MED ORDER — SODIUM CHLORIDE 0.9% FLUSH
10.0000 mL | INTRAVENOUS | Status: DC | PRN
  Administered 2016-10-28: 20 mL
  Filled 2016-10-28: qty 40

## 2016-10-28 MED ORDER — WARFARIN SODIUM 1 MG PO TABS
3.0000 mg | ORAL_TABLET | Freq: Once | ORAL | Status: AC
  Administered 2016-10-28: 3 mg via ORAL
  Filled 2016-10-28: qty 3

## 2016-10-28 MED ORDER — SODIUM CHLORIDE 0.9% FLUSH
10.0000 mL | Freq: Two times a day (BID) | INTRAVENOUS | Status: DC
  Administered 2016-10-28 (×2): 10 mL
  Administered 2016-10-29 (×2): 20 mL
  Administered 2016-10-30: 10 mL

## 2016-10-28 NOTE — Progress Notes (Signed)
[employer] - [place] at [facility]   PATIENT NAME: Mikayla [name]    MR#:  [id number]  DATE OF BIRTH:  02-08-1941  SUBJECTIVE:  CHIEF COMPLAINT:   Chief Complaint  Patient presents with  . Abnormal Lab   Awake. No pain/SOB/vomiting Daughter at bedside  REVIEW OF SYSTEMS:  Review of Systems  Constitutional: Positive for malaise/fatigue. Negative for chills, fever and weight loss.  HENT: Negative for ear discharge, hearing loss and nosebleeds.   Eyes: Negative for blurred vision, double vision and photophobia.  Respiratory: Negative for cough, hemoptysis, shortness of breath and wheezing.   Cardiovascular: Positive for palpitations. Negative for chest pain, orthopnea and leg swelling.  Gastrointestinal: Negative for abdominal pain, constipation, diarrhea, heartburn, melena, nausea and vomiting.  Genitourinary: Negative for dysuria.  Musculoskeletal: Positive for back pain, joint pain and myalgias. Negative for neck pain.  Skin: Negative for rash.  Neurological: Negative for dizziness, speech change, focal weakness and headaches.  Endo/Heme/Allergies: Does not bruise/bleed easily.  Psychiatric/Behavioral: Negative for depression.    DRUG ALLERGIES:   Allergies  Allergen Reactions  . Sulfa Antibiotics Hives    VITALS:  Blood pressure (!) 124/92, pulse (!) 130, temperature 98 F (36.7 C), temperature source Oral, resp. rate 18, height 5\' 4"  (1.626 m), weight 107 kg (236 lb), SpO2 99 %.  PHYSICAL EXAMINATION:  Physical Exam  GENERAL:  76 y.o.-year-old Obese patient lying in the bed with no acute distress. Appears contracted. Morbidly obese EYES: Pupils equal, round, reactive to light and accommodation. No scleral icterus. Extraocular muscles intact.  HEENT: Head atraumatic, normocephalic. Oropharynx and nasopharynx clear.  NECK:  Supple, no jugular venous distention. No thyroid enlargement, no tenderness.  LUNGS: Normal breath sounds bilaterally,  no wheezing, rales,rhonchi or crepitation. No use of accessory muscles of respiration. Decreased bibasilar breath sounds. CARDIOVASCULAR: S1, S2 normal. No murmurs, rubs, or gallops.  ABDOMEN: Soft, nontender, nondistended. Bowel sounds present. No organomegaly or mass.  EXTREMITIES: No pedal edema, cyanosis, or clubbing.  NEUROLOGIC: Cranial nerves II through XII are intact. Following simple commands. weaker on the left side and contracted left hand. Has significant arthritis. PSYCHIATRIC: The patient is alert and awake SKIN: Deep clean wound right lateral chest wall. Sacral decubitus ulcer  LABORATORY PANEL:   CBC  Recent Labs Lab 10/27/16 0500  WBC 7.5  HGB 8.2*  HCT 25.6*  PLT 439   ------------------------------------------------------------------------------------------------------------------  Chemistries   Recent Labs Lab 10/23/16 1550  10/27/16 0500 10/28/16 0410  NA 134*  < > 138 139  K 3.4*  < > 3.9 3.4*  CL 97*  < > 108 105  CO2 29  < > 26 30  GLUCOSE 90  < > 100* 107*  BUN 8  < > 6 5*  CREATININE 0.70  < > 0.78 0.73  CALCIUM 8.6*  < > 8.3* 8.2*  MG  --   < > 1.9  --   AST 14*  --   --   --   ALT <5*  --   --   --   ALKPHOS 62  --   --   --   BILITOT 0.6  --   --   --   < > = values in this interval not displayed. ------------------------------------------------------------------------------------------------------------------  Cardiac Enzymes  Recent Labs Lab 10/23/16 1550  TROPONINI <0.03   ------------------------------------------------------------------------------------------------------------------  RADIOLOGY:  No results found.  EKG:   Orders placed or performed during the hospital encounter of 10/23/16  . EKG 12-Lead  .  EKG 12-Lead  . EKG 12-Lead  . EKG 12-Lead    ASSESSMENT AND PLAN:   CatherineNoblittis a 76 y.o.femalewith a known history of A. fib, empyema, hypertension and C. difficile colitis. The patient was sent from  nursing home due to abnormal labs and tachycardia.  # Acute on chronic systolic CHF With acute hypoxic respiratory failure - Lasix, Beta blockers - Input and Output - Monitor Bun/Cr and Potassium  # Sepsis-secondary to urinary tract infection, klebsiella - On Ceftriaxone -Chest x-ray with no evidence of infection. No recurrence of empyema noted.  # Atrial fibrillation with rapid ventricular response. - Appreciate cardiology consult. Patient on amiodarone drip - change to oral amiodarone today. - Also on Coreg  - Echocardiogram showed EF 50% with hypokinesis. - Coumadin for anticoagulation.  # Hypokalemia and hypomagnesemia Replaced  # Anemia of chronic disease 1 unit transfused this admission.  # Parkinson's disease-continue Sinemet.  # Arthritis-on gabapentin and Celebrex.  # DVT prophylaxis-on Coumadin.  All the records are reviewed and case discussed with Care Management/Social Worker. Management plans discussed with the patient, family and they are in agreement.  CODE STATUS: DNR  TOTAL TIME TAKING CARE OF THIS PATIENT: 35 minutes.   POSSIBLE D/C IN 1-2 DAYS, DEPENDING ON CLINICAL CONDITION.  Milagros LollSudini, Oddie Bottger R M.D on 10/28/2016 at 1:47 PM  Between 7am to 6pm - Pager - [phone]  After 6pm go to [email or web address] - password [email or web address]  [facility] [place] [facility]  Office  [phone]  CC: Primary care physician; Pcp Not In System

## 2016-10-28 NOTE — Progress Notes (Signed)
Dressing to right shoulder changed per order. Dressing removed was saturated with yellow drainage. Heels foam was replaced and sacral would was changed. Pt tolerated procedure. I will continue to assess.

## 2016-10-28 NOTE — Consult Note (Addendum)
WOC Nurse wound consult note Reason for Consult:Nonhealing chest tube site right posterior lung field.   Stage 3 buttocks pressure injury, present on this admission.  Wound type: Pressure and nonhealing surgical  Sacrum,  buttocks and stage 2 on the right medial heel) in the presence of urinary incontinence managed by adult disposable briefs.  Patient is reluctant to reposition off of supine position and required two to turn and reposition properly.   Pressure Ulcer POA: Yes Measurement: left buttock measures 2cm x 1.5cm x 0.1cm, left aspect of buttock crease measures 2.4cm x 2cm x 0.1cm.  Right medial heel measures 2cm rouind x 0.1cm Wound bed: All three areas are pink, moist and painful to patient.  Drainage (amount, consistency, odor) Scant serous on old dressings Periwound: Intact, moist  Pressure Ulcer POA: Yes Measurement: right posterior lung field:  1 cm x 1 cm x 6 cm, communicates with a distal 0.5 cm opening distally.  Albumin 2.3.  Was started on Juven yesterday and is agreeable to drinking this. Discussed with patient and daughter at bedside that Albumin (protein ) was low and supplements would be necessary for wound healing.  Wound WUJ:WJXBbed:pale pink nongranulating Drainage (amount, consistency, odor) Moderate serosanguinous.  Some purulence noted.  Periwound:intact  Frequently moist from drainage.  Dressing procedure/placement/frequency:Cleanse right posterior back wound with NS.  Gently fill wound (2 openings) with Aquacel Ag, cut into a strip.  Cover with ABD pad and tape.  Change daily.   Wound care to Stage 3 pressure injuries at the buttocks (two areas) and right medial heel:  Cleanse with NS, pat gently dry. Cover with silicone foam and peel back each shift to assess. Turn patient side to side and avoid the supine position except for meals. Use chair cushion for pressure redistribution while OOB. Do not use disposable briefs while in house.  Will not follow at this time.  Please  re-consult if needed.  Maple HudsonKaren Fallynn Gravett RN BSN CWON Pager [phone]

## 2016-10-28 NOTE — Clinical Social Work Note (Signed)
CSW contacted [facility] in [place] and was informed that they do not have a contract with patient's insurance and they do not have long term Medicaid beds available.  CSW updated patient's daughter Sharlyne CaiSuzanne Buesing [phone], patient's daughter will discuss with her sister if the family would like to consider paying privately for patient to go to SNF in [place] and updated CSW.  [facility] once she is medically ready for discharge and orders have been received.  Ervin KnackEric R. Anterrio Mccleery, MSW, Theresia MajorsLCSWA [phone]  Mon-Fri 8a-4:30p 10/28/2016 4:41 PM

## 2016-10-28 NOTE — Progress Notes (Signed)
SUBJECTIVE: Patient is feeling much better   Vitals:   10/27/16 2150 10/28/16 0418 10/28/16 0513 10/28/16 0903  BP:   113/62 (!) 124/92  Pulse:  (!) 127 (!) 123 (!) 130  Resp:   18   Temp:   98 F (36.7 C)   TempSrc:   Oral   SpO2: 95% 97% 99%   Weight:      Height:        Intake/Output Summary (Last 24 hours) at 10/28/16 1536 Last data filed at 10/28/16 0052  Gross per 24 hour  Intake               50 ml  Output                0 ml  Net               50 ml    LABS: Basic Metabolic Panel:  Recent Labs  40/98/1099/11/12 0612 10/27/16 0500 10/28/16 0410  NA 140 138 139  K 3.2* 3.9 3.4*  CL 106 108 105  CO2 27 26 30   GLUCOSE 86 100* 107*  BUN 7 6 5*  CREATININE 0.72 0.78 0.73  CALCIUM 8.1* 8.3* 8.2*  MG 1.6* 1.9  --    Liver Function Tests: No results for input(s): AST, ALT, ALKPHOS, BILITOT, PROT, ALBUMIN in the last 72 hours. No results for input(s): LIPASE, AMYLASE in the last 72 hours. CBC:  Recent Labs  10/25/16 2151 10/27/16 0500  WBC  --  7.5  HGB 6.9* 8.2*  HCT 21.5* 25.6*  MCV  --  72.2*  PLT  --  439   Cardiac Enzymes: No results for input(s): CKTOTAL, CKMB, CKMBINDEX, TROPONINI in the last 72 hours. BNP: Invalid input(s): POCBNP D-Dimer: No results for input(s): DDIMER in the last 72 hours. Hemoglobin A1C: No results for input(s): HGBA1C in the last 72 hours. Fasting Lipid Panel: No results for input(s): CHOL, HDL, LDLCALC, TRIG, CHOLHDL, LDLDIRECT in the last 72 hours. Thyroid Function Tests: No results for input(s): TSH, T4TOTAL, T3FREE, THYROIDAB in the last 72 hours.  Invalid input(s): FREET3 Anemia Panel: No results for input(s): VITAMINB12, FOLATE, FERRITIN, TIBC, IRON, RETICCTPCT in the last 72 hours.   PHYSICAL EXAM General: Well developed, well nourished, in no acute distress HEENT:  Normocephalic and atramatic Neck:  No JVD.  Lungs: Clear bilaterally to auscultation and percussion. Heart: HRRR . Normal S1 and S2 without gallops  or murmurs.  Abdomen: Bowel sounds are positive, abdomen soft and non-tender  Msk:  Back normal, normal gait. Normal strength and tone for age. Extremities: No clubbing, cyanosis or edema.   Neuro: Alert and oriented X 3. Psych:  Good affect, responds appropriately  TELEMETRY:Sinus rhythm  ASSESSMENT AND PLAN: Sepsis/atrial fibrillation/pneumonia. Patient is remains on by mouth amiodarone sinus rhythm.  Active Problems:   Sepsis (HCC)    Laurier NancyKHAN,Khyla Mccumbers A, MD, [facility] 10/28/2016 3:36 PM

## 2016-10-28 NOTE — Progress Notes (Addendum)
ANTICOAGULATION CONSULT NOTE - FOLLOW UP   Pharmacy Consult for warfarin dosing Indication: atrial fibrillation  Allergies  Allergen Reactions  . Sulfa Antibiotics Hives   Patient Measurements: Height: 5\' 4"  (162.6 cm) Weight: 236 lb (107 kg) IBW/kg (Calculated) : 54.7  Vital Signs: Temp: 98 F (36.7 C) (01/03 0513) Temp Source: Oral (01/03 0513) BP: 124/92 (01/03 0903) Pulse Rate: 130 (01/03 0903)  Labs:  Recent Labs  10/25/16 1530 10/25/16 2151 10/26/16 0612 10/27/16 0500 10/28/16 0410  HGB 7.1* 6.9*  --  8.2*  --   HCT 22.2* 21.5*  --  25.6*  --   PLT 402  --   --  439  --   LABPROT  --   --  22.4* 22.2* 22.7*  INR  --   --  1.94 1.92 1.97  CREATININE 0.66  --  0.72 0.78 0.73    Estimated Creatinine Clearance: 71.4 mL/min (by C-G formula based on SCr of 0.73 mg/dL).   Medical History: Past Medical History:  Diagnosis Date  . Atrial fibrillation (HCC)   . Empyema [facility](HCC)    in [facility] Aug 2017- stayed in hospital on for 2 months.  . Hypertension     Assessment: Pharmacy consulted to dose and monitor warfarin in this 76 year old female taking warfarin 4 mg PO Daily prior to admission for atrial fibrillation.  INR 1.97 therapeutic on admission. Amiodarone started inpatient, will need to closely monitor for potential drug interaction which can enhance the effects of warfarin, although reaction is usually delayed.  Dosing history: Date INR Dose 12/29 1.97 12/30 -- 2.5 mg 12/31 1.86 No dose ordered 1/1 1.94 2.5 mg 1/2       1.92     2.5 mg  1/3       1.97    3 mg    Goal of Therapy:  INR 2-3  Plan:  INR is slightly subtherapeutic at 1.97. Patient missed dose of warfarin 12/31.  Will continue warfarin 3 mg PO dose tonight (slightly reduced from home dose given start of amiodarone).  Will recheck INR tomorrow morning.  Demetrius Charityeldrin D. Linday Rhodes, PharmD  10/28/2016 2:05 PM

## 2016-10-28 NOTE — Progress Notes (Signed)
Physical Therapy Treatment Patient Details Name: Mikayla [name] MRN: [id number] DOB: 12/17/1940 Today's Date: 10/28/2016    History of Present Illness [facility] Sepsis related to a UTI, AFib, and RVR requiring an amio drip    PT Comments    Pt agreeable to PT. Pt complains of pain in abdomen due to new onset of diarrhea today. Pt wishes to remain in bed. Pt participates well with Bilateral lower extremity supine exercises requiring assist for movement exercises bilaterally for most exercises. Pt also demonstrates very little range throughout with crepitus knees especially on the right. Continue PT to progress range and strength to improve/allow for functional mobility.   Follow Up Recommendations  SNF     Equipment Recommendations       Recommendations for Other Services       Precautions / Restrictions Precautions Precautions: Fall    Mobility  Bed Mobility               General bed mobility comments: Not tested; pt notes having new onset of severe diarrhea today and does not wish out of bed  Transfers                    Ambulation/Gait                 Stairs            Wheelchair Mobility    Modified Rankin (Stroke Patients Only)       Balance                                    Cognition Arousal/Alertness: Awake/alert Behavior During Therapy: WFL for tasks assessed/performed Overall Cognitive Status: Within Functional Limits for tasks assessed                      Exercises General Exercises - Lower Extremity Ankle Circles/Pumps: AROM;Both;20 reps;Supine Quad Sets: Strengthening;Both;20 reps;Supine Gluteal Sets: Strengthening;Both;20 reps;Supine Short Arc Quad: AROM;Both;20 reps;Supine (limited range) Heel Slides: AAROM;Both;20 reps;Supine (limited range) Hip ABduction/ADduction: AAROM;Both;20 reps;Supine (limited range)    General Comments        Pertinent Vitals/Pain Pain  Assessment:  (Reports stomach pain due to diarrhea)    Home Living                      Prior Function            PT Goals (current goals can now be found in the care plan section) Progress towards PT goals: Progressing toward goals (slowly)    Frequency    Min 2X/week      PT Plan Current plan remains appropriate    Co-evaluation             End of Session   Activity Tolerance: Other (comment) (diarrhea) Patient left: in bed;with call bell/phone within reach;with bed alarm set     Time: [id number] PT Time Calculation (min) (ACUTE ONLY): 23 min  Charges:  $Therapeutic Exercise: 23-37 mins                    G Codes[place]ot [place]:      Heidi E Barnes, PTA 10/28/2016, 4:12 PM

## 2016-10-29 LAB — CBC WITH DIFFERENTIAL/PLATELET
Basophils Absolute: 0.1 10*3/uL (ref 0–0.1)
Basophils Relative: 1 %
Eosinophils Absolute: 0.5 10*3/uL (ref 0–0.7)
Eosinophils Relative: 4 %
HEMATOCRIT: 29.1 % — AB (ref 35.0–47.0)
HEMOGLOBIN: 9.1 g/dL — AB (ref 12.0–16.0)
LYMPHS ABS: 2 10*3/uL (ref 1.0–3.6)
Lymphocytes Relative: 16 %
MCH: 22.7 pg — AB (ref 26.0–34.0)
MCHC: 31.4 g/dL — AB (ref 32.0–36.0)
MCV: 72.2 fL — ABNORMAL LOW (ref 80.0–100.0)
Monocytes Absolute: 0.9 10*3/uL (ref 0.2–0.9)
Monocytes Relative: 7 %
NEUTROS ABS: 8.7 10*3/uL — AB (ref 1.4–6.5)
NEUTROS PCT: 72 %
Platelets: 524 10*3/uL — ABNORMAL HIGH (ref 150–440)
RBC: 4.03 MIL/uL (ref 3.80–5.20)
RDW: 22.1 % — ABNORMAL HIGH (ref 11.5–14.5)
WBC: 12.1 10*3/uL — ABNORMAL HIGH (ref 3.6–11.0)

## 2016-10-29 LAB — BASIC METABOLIC PANEL
Anion gap: 5 (ref 5–15)
BUN: 8 mg/dL (ref 6–20)
CHLORIDE: 96 mmol/L — AB (ref 101–111)
CO2: 35 mmol/L — AB (ref 22–32)
CREATININE: 0.74 mg/dL (ref 0.44–1.00)
Calcium: 8.2 mg/dL — ABNORMAL LOW (ref 8.9–10.3)
GFR calc non Af Amer: >60 mL/min (ref >60)
Glucose, Bld: 96 mg/dL (ref 65–99)
POTASSIUM: 3 mmol/L — AB (ref 3.5–5.1)
Sodium: 136 mmol/L (ref 135–145)

## 2016-10-29 LAB — PROTIME-INR
INR: 2.06
Prothrombin Time: 23.5 seconds — ABNORMAL HIGH (ref 11.4–15.2)

## 2016-10-29 LAB — AEROBIC/ANAEROBIC CULTURE (SURGICAL/DEEP WOUND)

## 2016-10-29 LAB — AEROBIC/ANAEROBIC CULTURE W GRAM STAIN (SURGICAL/DEEP WOUND): Special Requests: NORMAL

## 2016-10-29 MED ORDER — METOPROLOL TARTRATE 25 MG PO TABS
25.0000 mg | ORAL_TABLET | Freq: Two times a day (BID) | ORAL | Status: DC
  Administered 2016-10-29 – 2016-10-30 (×2): 25 mg via ORAL
  Filled 2016-10-29 (×2): qty 1

## 2016-10-29 MED ORDER — DIGOXIN 0.25 MG/ML IJ SOLN
0.2500 mg | Freq: Four times a day (QID) | INTRAMUSCULAR | Status: AC
  Administered 2016-10-29 (×2): 0.25 mg via INTRAVENOUS
  Filled 2016-10-29 (×2): qty 1

## 2016-10-29 MED ORDER — WARFARIN SODIUM 1 MG PO TABS
3.0000 mg | ORAL_TABLET | Freq: Once | ORAL | Status: AC
Start: 2016-10-29 — End: 2016-10-29
  Administered 2016-10-29: 3 mg via ORAL
  Filled 2016-10-29: qty 3

## 2016-10-29 MED ORDER — POTASSIUM CHLORIDE CRYS ER 20 MEQ PO TBCR
20.0000 meq | EXTENDED_RELEASE_TABLET | Freq: Every day | ORAL | Status: DC
  Administered 2016-10-30: 20 meq via ORAL
  Filled 2016-10-29: qty 1

## 2016-10-29 MED ORDER — POTASSIUM CHLORIDE CRYS ER 20 MEQ PO TBCR
40.0000 meq | EXTENDED_RELEASE_TABLET | ORAL | Status: AC
  Administered 2016-10-29 (×2): 40 meq via ORAL
  Filled 2016-10-29 (×2): qty 2

## 2016-10-29 NOTE — Progress Notes (Signed)
[employer] - [place] [facility]   PATIENT NAME: Mikayla [name]    MR#:  [id number]  DATE OF BIRTH:  03/25/1941  SUBJECTIVE:  CHIEF COMPLAINT:   Chief Complaint  Patient presents with  . Abnormal Lab   Awake. No pain/SOB/vomiting HR - 130s  REVIEW OF SYSTEMS:  Review of Systems  Constitutional: Positive for malaise/fatigue. Negative for chills, fever and weight loss.  HENT: Negative for ear discharge, hearing loss and nosebleeds.   Eyes: Negative for blurred vision, double vision and photophobia.  Respiratory: Negative for cough, hemoptysis, shortness of breath and wheezing.   Cardiovascular: Positive for palpitations. Negative for chest pain, orthopnea and leg swelling.  Gastrointestinal: Negative for abdominal pain, constipation, diarrhea, heartburn, melena, nausea and vomiting.  Genitourinary: Negative for dysuria.  Musculoskeletal: Positive for back pain, joint pain and myalgias. Negative for neck pain.  Skin: Negative for rash.  Neurological: Negative for dizziness, speech change, focal weakness and headaches.  Endo/Heme/Allergies: Does not bruise/bleed easily.  Psychiatric/Behavioral: Negative for depression.    DRUG ALLERGIES:   Allergies  Allergen Reactions  . Sulfa Antibiotics Hives    VITALS:  Blood pressure (!) 99/56, pulse (!) 117, temperature 99 F (37.2 C), temperature source Oral, resp. rate 18, height 5\' 4"  (1.626 m), weight 107 kg (236 lb), SpO2 99 %.  PHYSICAL EXAMINATION:  Physical Exam  GENERAL:  76 y.o.-year-old Obese patient lying in the bed with no acute distress. Appears contracted. Morbidly obese EYES: Pupils equal, round, reactive to light and accommodation. No scleral icterus. Extraocular muscles intact.  HEENT: Head atraumatic, normocephalic. Oropharynx and nasopharynx clear.  NECK:  Supple, no jugular venous distention. No thyroid enlargement, no tenderness.  LUNGS: Normal breath sounds bilaterally, no wheezing,  rales,rhonchi or crepitation. No use of accessory muscles of respiration. Decreased bibasilar breath sounds. CARDIOVASCULAR: Irregularly irregular. tachycrdia ABDOMEN: Soft, nontender, nondistended. Bowel sounds present. No organomegaly or mass.  EXTREMITIES: No pedal edema, cyanosis, or clubbing.  NEUROLOGIC: Cranial nerves II through XII are intact. Following simple commands. weaker on the left side and contracted left hand. Has significant arthritis. PSYCHIATRIC: The patient is alert and awake SKIN: Deep clean wound right lateral chest wall. Sacral decubitus ulcer  LABORATORY PANEL:   CBC  Recent Labs Lab 10/29/16 0522  WBC 12.1*  HGB 9.1*  HCT 29.1*  PLT 524*   ------------------------------------------------------------------------------------------------------------------  Chemistries   Recent Labs Lab 10/23/16 1550  10/27/16 0500  10/29/16 0522  NA 134*  < > 138  < > 136  K 3.4*  < > 3.9  < > 3.0*  CL 97*  < > 108  < > 96*  CO2 29  < > 26  < > 35*  GLUCOSE 90  < > 100*  < > 96  BUN 8  < > 6  < > 8  CREATININE 0.70  < > 0.78  < > 0.74  CALCIUM 8.6*  < > 8.3*  < > 8.2*  MG  --   < > 1.9  --   --   AST 14*  --   --   --   --   ALT <5*  --   --   --   --   ALKPHOS 62  --   --   --   --   BILITOT 0.6  --   --   --   --   < > = values in this interval not displayed. ------------------------------------------------------------------------------------------------------------------  Cardiac Enzymes  Recent  Labs Lab 10/23/16 1550  TROPONINI <0.03   ------------------------------------------------------------------------------------------------------------------  RADIOLOGY:  No results found.  EKG:   Orders placed or performed during the hospital encounter of 10/23/16  . EKG 12-Lead  . EKG 12-Lead  . EKG 12-Lead  . EKG 12-Lead    ASSESSMENT AND PLAN:   CatherineNoblittis a 76 y.o.femalewith a known history of A. fib, empyema, hypertension and C.  difficile colitis. The patient was sent from nursing home due to abnormal labs and tachycardia.  # Atrial fibrillation with rapid ventricular response. HR in 130s Afib Will give Stat dose IV digoxin due to hypotension. Unable to give cardizem or lopressor Coumadin  # Acute on chronic systolic CHF With acute hypoxic respiratory failure - Lasix, Beta blockers - Input and Output - Monitor Bun/Cr and Potassium - Echocardiogram showed EF 50% with hypokinesis.  # Sepsis-secondary to urinary tract infection, klebsiella - On Ceftriaxone -Chest x-ray with no evidence of infection. No recurrence of empyema noted.  # Hypokalemia and hypomagnesemia Replaced  # Anemia of chronic disease 1 unit transfused this admission.  # Parkinson's disease-continue Sinemet.  # Arthritis-on gabapentin and Celebrex.  # DVT prophylaxis-on Coumadin.  All the records are reviewed and case discussed with Care Management/Social Worker. Management plans discussed with the patient, family and they are in agreement.  CODE STATUS: DNR  TOTAL TIME TAKING CARE OF THIS PATIENT: 35 minutes.   D/C to rehab when HR improved  Milagros Loll R M.D on 10/29/2016 at 2:57 PM  Between 7am to 6pm - Pager - [phone]  After 6pm go to [email or web address] - password [employer]  [facility] [place]  Office  [phone]  CC: Primary care physician; Pcp Not In System

## 2016-10-29 NOTE — Plan of Care (Signed)
Problem: Skin Integrity: Goal: Risk for impaired skin integrity will decrease Outcome: Not Progressing Patient refuses to have feet floated and turn q2h. Patient is incontinent to urine and bowel.

## 2016-10-29 NOTE — Progress Notes (Signed)
Notified by central telemetry of 7 beat run of VTach. MD notified.

## 2016-10-29 NOTE — Progress Notes (Signed)
ANTICOAGULATION CONSULT NOTE - FOLLOW UP   Pharmacy Consult for warfarin dosing Indication: atrial fibrillation  Allergies  Allergen Reactions  . Sulfa Antibiotics Hives   Patient Measurements: Height: 5\' 4"  (162.6 cm) Weight: 236 lb (107 kg) IBW/kg (Calculated) : 54.7  Vital Signs: Temp: 98.2 F (36.8 C) (01/04 0356) Temp Source: Oral (01/04 0356) BP: 108/63 (01/04 0356) Pulse Rate: 120 (01/04 0356)  Labs:  Recent Labs  10/27/16 0500 10/28/16 0410 10/29/16 0522  HGB 8.2*  --  9.1*  HCT 25.6*  --  29.1*  PLT 439  --  524*  LABPROT 22.2* 22.7* 23.5*  INR 1.92 1.97 2.06  CREATININE 0.78 0.73 0.74    Estimated Creatinine Clearance: 71.4 mL/min (by C-G formula based on SCr of 0.74 mg/dL).   Medical History: Past Medical History:  Diagnosis Date  . Atrial fibrillation (HCC)   . Empyema [facility](HCC)    in [facility] Aug 2017- stayed in hospital on for 2 months.  . Hypertension     Assessment: Pharmacy consulted to dose and monitor warfarin in this 76 year old female taking warfarin 4 mg PO Daily prior to admission for atrial fibrillation.  INR 1.97 therapeutic on admission. Amiodarone started inpatient, will need to closely monitor for potential drug interaction which can enhance the effects of warfarin, although reaction is usually delayed.  Dosing history: Date INR Dose 12/29 1.97 12/30 -- 2.5 mg 12/31 1.86 No dose ordered 1/1 1.94 2.5 mg 1/2       1.92     2.5 mg  1/3       1.97    3 mg  1/4       2.06       Goal of Therapy:  INR 2-3  Plan:  INR is slightly subtherapeutic at 1.97. Patient missed dose of warfarin 12/31.  Will give warfarin 3 mg PO dose tonight (slightly reduced from home dose given start of amiodarone).  Will recheck INR tomorrow morning.  Demetrius Charityeldrin D. Elad Macphail, PharmD  10/29/2016 7:30 AM

## 2016-10-29 NOTE — Progress Notes (Signed)
SUBJECTIVE: patient woke up this morning and was feeling much better no shortness of breath or chest   Vitals:   10/28/16 1435 10/28/16 1900 10/29/16 0356 10/29/16 0841  BP:  (!) 138/56 108/63 (!) 99/56  Pulse:  (!) 107 (!) 120 (!) 117  Resp:  18 18 18   Temp:  98.2 F (36.8 C) 98.2 F (36.8 C) 99 F (37.2 C)  TempSrc:  Oral Oral Oral  SpO2: 99% 100% 97% 99%  Weight:      Height:        Intake/Output Summary (Last 24 hours) at 10/29/16 0853 Last data filed at 10/28/16 2037  Gross per 24 hour  Intake                0 ml  Output                0 ml  Net                0 ml    LABS: Basic Metabolic Panel:  Recent Labs  11/91/4699/12/13 0500 10/28/16 0410 10/29/16 0522  NA 138 139 136  K 3.9 3.4* 3.0*  CL 108 105 96*  CO2 26 30 35*  GLUCOSE 100* 107* 96  BUN 6 5* 8  CREATININE 0.78 0.73 0.74  CALCIUM 8.3* 8.2* 8.2*  MG 1.9  --   --    Liver Function Tests: No results for input(s): AST, ALT, ALKPHOS, BILITOT, PROT, ALBUMIN in the last 72 hours. No results for input(s): LIPASE, AMYLASE in the last 72 hours. CBC:  Recent Labs  10/27/16 0500 10/29/16 0522  WBC 7.5 12.1*  NEUTROABS  --  8.7*  HGB 8.2* 9.1*  HCT 25.6* 29.1*  MCV 72.2* 72.2*  PLT 439 524*   Cardiac Enzymes: No results for input(s): CKTOTAL, CKMB, CKMBINDEX, TROPONINI in the last 72 hours. BNP: Invalid input(s): POCBNP D-Dimer: No results for input(s): DDIMER in the last 72 hours. Hemoglobin A1C: No results for input(s): HGBA1C in the last 72 hours. Fasting Lipid Panel: No results for input(s): CHOL, HDL, LDLCALC, TRIG, CHOLHDL, LDLDIRECT in the last 72 hours. Thyroid Function Tests: No results for input(s): TSH, T4TOTAL, T3FREE, THYROIDAB in the last 72 hours.  Invalid input(s): FREET3 Anemia Panel: No results for input(s): VITAMINB12, FOLATE, FERRITIN, TIBC, IRON, RETICCTPCT in the last 72 hours.   PHYSICAL EXAM General: Well developed, well nourished, in no acute distress HEENT:   Normocephalic and atramatic Neck:  No JVD.  Lungs: Clear bilaterally to auscultation and percussion. Heart: HRRR . Normal S1 and S2 without gallops or murmurs.  Abdomen: Bowel sounds are positive, abdomen soft and non-tender  Msk:  Back normal, normal gait. Normal strength and tone for age. Extremities: No clubbing, cyanosis or edema.   Neuro: Alert and oriented X 3. Psych:  Good affect, responds appropriately  TELEMETRY: sinus rhythm about 102 bpm  ASSESSMENT AND PLAN: paroxysmal atrial fibrillation with sepsis and pneumonia currently in sinus rhythm advise continuing by mouth amiodarone 400 once a day.  Active Problems:   Sepsis (HCC)    Laurier NancyKHAN,Arick Mareno A, MD, [facility] 10/29/2016 8:53 AM

## 2016-10-29 NOTE — Progress Notes (Signed)
Notified Dr.Hugelmayer that PEG tube is leaking. Attempted to flush with 50cc of water and it leaked at the insertion site. Dressing changed. PEG tube currently not in use for administration of medication or food

## 2016-10-30 LAB — PROTIME-INR
INR: 1.86
Prothrombin Time: 21.7 seconds — ABNORMAL HIGH (ref 11.4–15.2)

## 2016-10-30 LAB — CREATININE, SERUM
Creatinine, Ser: 0.86 mg/dL (ref 0.44–1.00)
GFR calc non Af Amer: >60 mL/min (ref >60)

## 2016-10-30 MED ORDER — FUROSEMIDE 20 MG PO TABS: 20.0000 mg | ORAL_TABLET | Freq: Every day | ORAL | Status: DC

## 2016-10-30 MED ORDER — AMIODARONE HCL 400 MG PO TABS: 400.0000 mg | ORAL_TABLET | Freq: Every day | ORAL | Status: DC

## 2016-10-30 MED ORDER — SODIUM CHLORIDE 0.9% FLUSH: 10.0000 mL | INTRAVENOUS | Status: DC | PRN

## 2016-10-30 MED ORDER — DIGOXIN 250 MCG PO TABS
0.2500 mg | ORAL_TABLET | Freq: Every day | ORAL | Status: DC
  Administered 2016-10-30: 0.25 mg via ORAL
  Filled 2016-10-30: qty 1

## 2016-10-30 MED ORDER — DIGOXIN 250 MCG PO TABS: 0.2500 mg | ORAL_TABLET | Freq: Every day | ORAL | Status: DC

## 2016-10-30 MED ORDER — METOPROLOL TARTRATE 25 MG PO TABS: 25.0000 mg | ORAL_TABLET | Freq: Two times a day (BID) | ORAL | Status: DC

## 2016-10-30 MED ORDER — WARFARIN SODIUM 4 MG PO TABS: 4.0000 mg | ORAL_TABLET | Freq: Once | ORAL | Status: DC

## 2016-10-30 NOTE — Discharge Instructions (Signed)
Low salt diet  Activity as tolerated with Dan HumphreysWalker and PT

## 2016-10-30 NOTE — NC FL2 (Signed)
[place] MEDICAID FL2 LEVEL OF CARE SCREENING TOOL     IDENTIFICATION  Patient Name: Mikayla [name] Birthdate: 10-07-1941 Sex: female Admission Date (Current Location): 10/23/2016  [place] and [place] Number:  Chiropodist and Address:  [facility], [address] [place], [place] [address]      Provider Number: [id number]  Attending Physician Name and Address:  Milagros Loll, MD  Relative Name and Phone Number:  Susa Loffler Daughter [phone] or Buesing,[name] Daughter [phone]     Current Level of Care: Hospital Recommended Level of Care: Skilled Nursing Facility Prior Approval Number:    Date Approved/Denied:   PASRR Number:  [id number])  Discharge Plan: SNF    Current Diagnoses: Patient Active Problem List   Diagnosis Date Noted  . Sepsis (HCC) 10/23/2016  . Pressure injury of skin 09/30/2016  . Palliative care encounter   . Goals of care, counseling/discussion   . Encounter for hospice care discussion   . Septic shock (HCC) 09/28/2016  . Diarrhea 09/28/2016    Orientation RESPIRATION BLADDER Height & Weight     Self, Situation, Place  O2 (2L) Incontinent Weight: 223 lb 1.6 oz (101.2 kg) Height:  5\' 4"  (162.6 cm)  BEHAVIORAL SYMPTOMS/MOOD NEUROLOGICAL BOWEL NUTRITION STATUS      Incontinent Diet (Cardiac diet)  AMBULATORY STATUS COMMUNICATION OF NEEDS Skin   Extensive Assist Verbally PU Stage and Appropriate Care, Other (Comment)   PU Stage 2 Dressing:  (PRN dressing change)                   Personal Care Assistance Level of Assistance  Bathing, Feeding, Dressing Bathing Assistance: Limited assistance Feeding assistance: Limited assistance Dressing Assistance: Limited assistance     Functional Limitations Info  Sight, Hearing, Speech Sight Info: Adequate Hearing Info: Adequate Speech Info: Adequate    SPECIAL CARE FACTORS FREQUENCY  PT (By licensed PT), OT (By licensed OT)     PT  Frequency: 5x a week OT Frequency: 3x a week            Contractures Contractures Info: Not present    Additional Factors Info  Code Status, Allergies, Isolation Precautions Code Status Info: DNR Allergies Info: Sulfa Antibiotics     Isolation Precautions Info:  (Enteric Precautions MRSA)     Current Medications (10/30/2016):  This is the current hospital active medication list Current Facility-Administered Medications  Medication Dose Route Frequency Provider Last Rate Last Dose  . 0.9 %  sodium chloride infusion   Intravenous Once Oralia Manis, MD      . acetaminophen (TYLENOL) tablet 650 mg  650 mg Oral Q6H PRN Shaune Pollack, MD   650 mg at 10/23/16 2103   Or  . acetaminophen (TYLENOL) suppository 650 mg  650 mg Rectal Q6H PRN Shaune Pollack, MD      . albuterol (PROVENTIL) (2.5 MG/3ML) 0.083% nebulizer solution 2.5 mg  2.5 mg Nebulization Q2H PRN Shaune Pollack, MD      . amiodarone (PACERONE) tablet 400 mg  400 mg Oral Daily Laurier Nancy, MD   400 mg at 10/30/16 1024  . carbidopa-levodopa (SINEMET IR) 10-100 MG per tablet immediate release 1 tablet  1 tablet Oral TID Shaune Pollack, MD   1 tablet at 10/30/16 1024  . cefTRIAXone (ROCEPHIN) IVPB 1 g  1 g Intravenous Q24H Jodelle Red De Smet, RPH   1 g at 10/29/16 1546  . celecoxib (CELEBREX) capsule 100 mg  100 mg Oral BID Enid Baas, MD  100 mg at 10/30/16 1025  . chlorhexidine (PERIDEX) 0.12 % solution 15 mL  15 mL Mouth Rinse BID Shaune PollackQing Chen, MD   15 mL at 10/30/16 1000  . digoxin (LANOXIN) tablet 0.25 mg  0.25 mg Oral Daily Srikar Sudini, MD   0.25 mg at 10/30/16 1024  . gabapentin (NEURONTIN) capsule 300 mg  300 mg Oral TID Shaune PollackQing Chen, MD   300 mg at 10/30/16 1025  . ipratropium-albuterol (DUONEB) 0.5-2.5 (3) MG/3ML nebulizer solution 3 mL  3 mL Nebulization TID Shaune PollackQing Chen, MD   3 mL at 10/30/16 0727  . MEDLINE mouth rinse  15 mL Mouth Rinse q12n4p Shaune PollackQing Chen, MD   15 mL at 10/29/16 1200  . Melatonin TABS 5 mg  5 mg Oral QHS Shaune PollackQing Chen, MD   5  mg at 10/29/16 2258  . metoprolol tartrate (LOPRESSOR) tablet 25 mg  25 mg Oral BID Milagros LollSrikar Sudini, MD   25 mg at 10/30/16 1024  . nutrition supplement (JUVEN) (JUVEN) powder packet 1 packet  1 packet Oral BID BM Milagros LollSrikar Sudini, MD   1 packet at 10/29/16 1546  . ondansetron (ZOFRAN) tablet 4 mg  4 mg Oral Q6H PRN Shaune PollackQing Chen, MD       Or  . ondansetron [facility](ZOFRAN) injection 4 mg  4 mg Intravenous Q6H PRN Shaune PollackQing Chen, MD   4 mg at 10/24/16 2025  . pantoprazole (PROTONIX) EC tablet 40 mg  40 mg Oral Daily Shaune PollackQing Chen, MD   40 mg at 10/30/16 1025  . PARoxetine (PAXIL) tablet 30 mg  30 mg Oral Daily Shaune PollackQing Chen, MD   30 mg at 10/30/16 1024  . potassium chloride SA (K-DUR,KLOR-CON) CR tablet 20 mEq  20 mEq Oral Daily Milagros LollSrikar Sudini, MD   20 mEq at 10/30/16 1024  . sodium chloride (OCEAN) 0.65 % nasal spray 1 spray  1 spray Each Nare Q2H PRN Shaune PollackQing Chen, MD      . sodium chloride flush (NS) 0.9 % injection 10-40 mL  10-40 mL Intracatheter Q12H Srikar Sudini, MD   10 mL at 10/30/16 1000  . sodium chloride flush (NS) 0.9 % injection 10-40 mL  10-40 mL Intracatheter PRN Srikar Sudini, MD      . traZODone (DESYREL) tablet 25 mg  25 mg Oral QHS Shaune PollackQing Chen, MD   25 mg at 10/29/16 2259  . Warfarin - Pharmacist Dosing Inpatient   Does not apply q1800 Gardner CandleSheema M Hallaji, [facility] CenterRPH         Discharge Medications: Please see discharge summary for a list of discharge medications.  Relevant Imaging Results:  Relevant Lab Results:   Additional Information SSN [id number]  Darleene Cleavernterhaus, Lamona Eimer R, [place]CSWA

## 2016-10-30 NOTE — Care Management Important Message (Signed)
Important Message  Patient Details  Name: Mikayla [name] MRN: [id number] Date of Birth: 08/14/1941   Medicare Important Message Given:  Yes    Eber HongGreene, Sherlon Nied R, RN 10/30/2016, 3:15 PM

## 2016-10-30 NOTE — Progress Notes (Signed)
Occupational Therapy Treatment Patient Details Name: Mikayla [name] MRN: [id number] DOB: 13-Dec-1940 Today's Date: 10/30/2016    History of present illness Pt. was admitted to [facility] with Sepsis related to a UTI, AFib, and RVR requiring an amio drip   OT comments  Pt seen with her daughter to review energy conservation tech including pursed lip breathing and AD for LB dressing and when using FWW when more mobile.  Pt able to demonstrate breathing techniques.  She is currently on 2L nasal cannula.  Pt to have GI consult today about assessment for possible peg tube removal and may discharge to SNF on Saturday.  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 Pt making gradual progress.  Follow Up Recommendations  SNF    Equipment Recommendations       Recommendations for Other Services      Precautions / Restrictions Precautions Precautions: Fall Precaution Comments: PEG, stage II pressure area to buttocks, R heel and R shoulder (previous surgical incision) Restrictions Weight Bearing Restrictions: Yes       Mobility Bed Mobility                  Transfers                      Balance                                   ADL Overall ADL's : Needs assistance/impaired                                       General ADL Comments: Education with pt and her daughter about energy Engineer, drilling and breathing with teach back.  Also discussed rec adaptations for FWW when she is more mobile and LB dressing AD. GI to assess for posssible peg tube removal today and DC tomorrow.      Vision                     Perception     Praxis      Cognition   Behavior During Therapy: WFL for tasks assessed/performed Overall Cognitive Status: Within Functional Limits for tasks assessed                       Extremity/Trunk Assessment                Exercises     Shoulder Instructions       General Comments      Pertinent Vitals/ Pain       Pain Assessment: Faces Pain Score: 4  Pain Location: L hand with PROM Pain Descriptors / Indicators: Aching;Grimacing Pain Intervention(s): Monitored during session  Home Living                                          Prior Functioning/Environment              Frequency  Min 1X/week        Progress Toward Goals  OT Goals(current goals can now be found in the care plan section)  Progress  towards OT goals: Progressing toward goals     Plan Discharge plan remains appropriate    Co-evaluation                 End of Session     Activity Tolerance Patient tolerated treatment well   Patient Left in bed;with call bell/phone within reach;with bed alarm set;with family/visitor present   Nurse Communication          Time: [id number] OT Time Calculation (min): 29 min  Charges: OT General Charges $OT Visit: 1 Procedure OT Treatments $Self Care/Home Management : 23-37 mins  Mikayla [name], OTR/L ascom [phone] 10/30/16, 12:38 PM

## 2016-10-30 NOTE — Clinical Social Work Note (Signed)
[facility].  [facility]  Patient and family agreeable to plans will transport via ems RN to call report to [phone], patient's daughter Mikayla CaiSuzanne [name] was informed about patient discharging back to SNF today.  Mikayla MouldingEric Keyante [name], MSW, Mikayla MajorsLCSWA [phone]

## 2016-10-30 NOTE — Progress Notes (Signed)
ANTICOAGULATION CONSULT NOTE - FOLLOW UP   Pharmacy Consult for warfarin dosing Indication: atrial fibrillation  Allergies  Allergen Reactions  . Sulfa Antibiotics Hives   Patient Measurements: Height: 5\' 4"  (162.6 cm) Weight: 223 lb 1.6 oz (101.2 kg) IBW/kg (Calculated) : 54.7  Vital Signs: Temp: 98 F (36.7 C) (01/05 1146) Temp Source: Oral (01/05 1146) BP: 100/48 (01/05 1146) Pulse Rate: 43 (01/05 1146)  Labs:  Recent Labs  10/28/16 0410 10/29/16 0522 10/30/16 0729  HGB  --  9.1*  --   HCT  --  29.1*  --   PLT  --  524*  --   LABPROT 22.7* 23.5* 21.7*  INR 1.97 2.06 1.86  CREATININE 0.73 0.74 0.86    Estimated Creatinine Clearance: 64.4 mL/min (by C-G formula based on SCr of 0.86 mg/dL).   Medical History: Past Medical History:  Diagnosis Date  . Atrial fibrillation (HCC)   . Empyema [facility](HCC)    in [facility] Aug 2017- stayed in hospital on for 2 months.  . Hypertension     Assessment: Pharmacy consulted to dose and monitor warfarin in this 76 year old female taking warfarin 4 mg PO Daily prior to admission for atrial fibrillation.  INR 1.97 therapeutic on admission. Amiodarone started inpatient, will need to closely monitor for potential drug interaction which can enhance the effects of warfarin, although reaction is usually delayed.  Dosing history: Date INR Dose 12/29 1.97 12/30 -- 2.5 mg 12/31 1.86 No dose ordered 1/1 1.94 2.5 mg 1/2       1.92     2.5 mg  1/3       1.97    3 mg  1/4       2.06    3 mg  1/5       1.86   Goal of Therapy:  INR 2-3  Plan:  INR is slightly subtherapeutic at 1.97. Patient missed dose of warfarin 12/31.  Will give warfarin 4 mg PO dose tonight. Will recheck INR tomorrow morning.  Demetrius Charityeldrin D. Tahmid Stonehocker, PharmD  10/30/2016 12:05 PM

## 2016-10-30 NOTE — Progress Notes (Signed)
Checked to see if pre-authorization would be needed for non-emergent EMS transport. Per [employer] benefits obtained online through [employer], patient has a [employer] Medicare Advantage PPO policy.  Medicare PPO plans do not require pre-auth for non-emergent ground transports using service codes A0426 or A0428.   

## 2016-10-30 NOTE — Progress Notes (Signed)
Patient discharged via EMS and Stretcher.PICC removed and catheter intact. All discharge instructions given and patient verbalizes understanding. Tele removed and returned. No prescriptions given to patient No distress noted. [facility]

## 2016-10-30 NOTE — Discharge Summary (Signed)
[employer] -  at [facility]   PATIENT NAME: Mikayla [name]    MR#:  [id number]  DATE OF BIRTH:  20-Oct-1941  DATE OF ADMISSION:  10/23/2016 ADMITTING PHYSICIAN: Shaune Pollack, MD  DATE OF DISCHARGE: 10/31/2015  PRIMARY CARE PHYSICIAN: Pcp Not In System   ADMISSION DIAGNOSIS:  Wound infection [T14.8XXA, L08.9] Sepsis, due to unspecified organism (HCC) [A41.9] Urinary tract infection without hematuria, site unspecified [N39.0]  DISCHARGE DIAGNOSIS:  Active Problems:   Sepsis (HCC) Klebsiella PNA Acute on chronic systolic chf Afib Obesity Decubitus ulcers  SECONDARY DIAGNOSIS:   Past Medical History:  Diagnosis Date  . Atrial fibrillation (HCC)   . Empyema [facility])    in [facility] Aug 2017- stayed in hospital on for 2 months.  . Hypertension      ADMITTING HISTORY  HISTORY OF PRESENT ILLNESS:  Mikayla [name]  is a 76 y.o. female with a known history of A. fib, empyema, hypertension and C. difficile colitis. The patient was sent from nursing home to the ED due to above chief complaints. The patient denies any symptoms, but she is not good historian. She was found leukocytosis and A. fib with RVR. Urinalysis show UTI. In addition, she has surgical wound infection with discharge on the right side of the back. Wound culture was sent. She is being treated with vancomycin and Zosyn in the ED.   HOSPITAL COURSE:   CatherineNoblittis a 76 y.o.femalewith a known history of A. fib, empyema, hypertension and C. difficile colitis. The patient was sent from nursing home due to abnormal labs and tachycardia.  # Atrial fibrillation with rapid ventricular response. HR in 60s ON coumadin, digoxin and metoprolol Coumadin  # Acute on chronic systolic CHF With acute hypoxic respiratory failure - resolved - Lasix, Beta blockers - Echocardiogram showed EF 50% with hypokinesis.  # Sepsis-secondary to urinary tract infection, klebsiella - On Ceftriaxone -Chest  x-ray with no evidence of infection. No recurrence of empyema noted. Finished 1 week abx in hospital  # Hypokalemia and hypomagnesemia Replaced  # Anemia of chronic disease 1 unit transfused this admission. Hb stable  # Parkinson's disease-continue Sinemet.  # Arthritis-on gabapentin and Celebrex.  # DVT prophylaxis-on Coumadin.  Stable for discharge to SNF.  She needs follow up with   Cardiology Dr. Welton Flakes in 1-2 weeks GI Dr. Tobi Bastos for PEG removal.  CONSULTS OBTAINED:  Treatment Team:  Laurier Nancy, MD Enid Baas, MD  DRUG ALLERGIES:   Allergies  Allergen Reactions  . Sulfa Antibiotics Hives    DISCHARGE MEDICATIONS:   Current Discharge Medication List    START taking these medications   Details  amiodarone (PACERONE) 400 MG tablet Take 1 tablet (400 mg total) by mouth daily.    digoxin (LANOXIN) 0.25 MG tablet Take 1 tablet (0.25 mg total) by mouth daily.    metoprolol tartrate (LOPRESSOR) 25 MG tablet Take 1 tablet (25 mg total) by mouth 2 (two) times daily.      CONTINUE these medications which have CHANGED   Details  furosemide (LASIX) 20 MG tablet Take 1 tablet (20 mg total) by mouth daily. Qty: 30 tablet      CONTINUE these medications which have NOT CHANGED   Details  acetaminophen (TYLENOL) 500 MG tablet Take 1,000 mg by mouth every 8 (eight) hours as needed for moderate pain or fever.    amLODipine (NORVASC) 10 MG tablet Take 10 mg by mouth daily.    carbidopa-levodopa (SINEMET IR) 10-100 MG tablet Take 1  tablet by mouth 3 (three) times daily.    carvedilol (COREG) 12.5 MG tablet Take 12.5 mg by mouth 2 (two) times daily with a meal.    celecoxib (CELEBREX) 100 MG capsule Take 100 mg by mouth 2 (two) times daily.    gabapentin (NEURONTIN) 300 MG capsule Take 300 mg by mouth 3 (three) times daily.    ipratropium-albuterol (DUONEB) 0.5-2.5 (3) MG/3ML SOLN Take 3 mLs by nebulization 3 (three) times daily.    Melatonin 5 MG TABS  Take 5 mg by mouth at bedtime.    omeprazole (PRILOSEC OTC) 20 MG tablet Take 20 mg by mouth 2 (two) times daily.    PARoxetine (PAXIL) 30 MG tablet Take 30 mg by mouth daily.     potassium chloride SA (K-DUR,KLOR-CON) 20 MEQ tablet Take 20 mEq by mouth daily.    sodium chloride (OCEAN) 0.65 % SOLN nasal spray Place 1 spray into both nostrils every 2 (two) hours as needed for congestion (allergies).    traZODone (DESYREL) 50 MG tablet Take 25 mg by mouth at bedtime.    warfarin (COUMADIN) 4 MG tablet Take 4 mg by mouth daily.      STOP taking these medications     omeprazole (PRILOSEC) 20 MG capsule      ciprofloxacin (CIPRO) 500 MG tablet      metroNIDAZOLE (FLAGYL) 500 MG tablet         Today   VITAL SIGNS:  Blood pressure (!) 100/48, pulse (!) 43, temperature 98 F (36.7 C), temperature source Oral, resp. rate 17, height 5\' 4"  (1.626 m), weight 101.2 kg (223 lb 1.6 oz), SpO2 100 %.  I/O:   Intake/Output Summary (Last 24 hours) at 10/30/16 1248 Last data filed at 10/29/16 2200  Gross per 24 hour  Intake              190 ml  Output                0 ml  Net              190 ml    PHYSICAL EXAMINATION:  Physical Exam  GENERAL:  76 y.o.-year-old patient lying in the bed with no acute distress. Obese LUNGS: Normal breath sounds bilaterally, no wheezing, rales,rhonchi or crepitation. No use of accessory muscles of respiration.  CARDIOVASCULAR: S1, S2 normal. No murmurs, rubs, or gallops.  ABDOMEN: Soft, non-tender, non-distended. Bowel sounds present. No organomegaly or mass.  NEUROLOGIC: Moves all 4 extremities. PSYCHIATRIC: The patient is alert and oriented x 3.  SKIN: Sacral ulcer and right upper back wounds are clean.  DATA REVIEW:   CBC  Recent Labs Lab 10/29/16 0522  WBC 12.1*  HGB 9.1*  HCT 29.1*  PLT 524*    Chemistries   Recent Labs Lab 10/23/16 1550  10/27/16 0500  10/29/16 0522 10/30/16 0729  NA 134*  < > 138  < > 136  --   K 3.4*  < >  3.9  < > 3.0*  --   CL 97*  < > 108  < > 96*  --   CO2 29  < > 26  < > 35*  --   GLUCOSE 90  < > 100*  < > 96  --   BUN 8  < > 6  < > 8  --   CREATININE 0.70  < > 0.78  < > 0.74 0.86  CALCIUM 8.6*  < > 8.3*  < > 8.2*  --  MG  --   < > 1.9  --   --   --   AST 14*  --   --   --   --   --   ALT <5*  --   --   --   --   --   ALKPHOS 62  --   --   --   --   --   BILITOT 0.6  --   --   --   --   --   < > = values in this interval not displayed.  Cardiac Enzymes  Recent Labs Lab 10/23/16 1550  TROPONINI <0.03    Microbiology Results  Results for orders placed or performed during the hospital encounter of 10/23/16  Urine culture     Status: Abnormal   Collection Time: 10/23/16  3:42 PM  Result Value Ref Range Status   Specimen Description URINE, CATHETERIZED  Final   Special Requests NONE  Final   Culture >=100,000 COLONIES/mL KLEBSIELLA PNEUMONIAE (A)  Final   Report Status 10/26/2016 FINAL  Final   Organism ID, Bacteria KLEBSIELLA PNEUMONIAE (A)  Final      Susceptibility   Klebsiella pneumoniae - MIC*    AMPICILLIN >=32 RESISTANT Resistant     CEFAZOLIN <=4 SENSITIVE Sensitive     CEFTRIAXONE <=1 SENSITIVE Sensitive     CIPROFLOXACIN >=4 RESISTANT Resistant     GENTAMICIN <=1 SENSITIVE Sensitive     IMIPENEM <=0.25 SENSITIVE Sensitive     NITROFURANTOIN 256 RESISTANT Resistant     TRIMETH/SULFA >=320 RESISTANT Resistant     AMPICILLIN/SULBACTAM >=32 RESISTANT Resistant     PIP/TAZO 32 INTERMEDIATE Intermediate     Extended ESBL NEGATIVE Sensitive     * >=100,000 COLONIES/mL KLEBSIELLA PNEUMONIAE  Aerobic/Anaerobic Culture (surgical/deep wound)     Status: None   Collection Time: 10/23/16  3:42 PM  Result Value Ref Range Status   Specimen Description WOUND  Final   Special Requests Normal  Final   Gram Stain   Final    ABUNDANT WBC PRESENT, PREDOMINANTLY PMN RARE GRAM VARIABLE ROD    Culture   Final    RARE METHICILLIN RESISTANT STAPHYLOCOCCUS AUREUS FEW KLEBSIELLA  PNEUMONIAE NO ANAEROBES ISOLATED Performed at [facility]    Report Status 10/29/2016 FINAL  Final   Organism ID, Bacteria KLEBSIELLA PNEUMONIAE  Final      Susceptibility   Klebsiella pneumoniae - MIC*    AMPICILLIN >=32 RESISTANT Resistant     CEFAZOLIN <=4 SENSITIVE Sensitive     CEFEPIME <=1 SENSITIVE Sensitive     CEFTAZIDIME <=1 SENSITIVE Sensitive     CEFTRIAXONE <=1 SENSITIVE Sensitive     CIPROFLOXACIN >=4 RESISTANT Resistant     GENTAMICIN <=1 SENSITIVE Sensitive     IMIPENEM <=0.25 SENSITIVE Sensitive     TRIMETH/SULFA >=320 RESISTANT Resistant     AMPICILLIN/SULBACTAM >=32 RESISTANT Resistant     PIP/TAZO 32 INTERMEDIATE Intermediate     Extended ESBL NEGATIVE Sensitive     * FEW KLEBSIELLA PNEUMONIAE  MRSA PCR Screening     Status: None   Collection Time: 10/24/16  2:56 AM  Result Value Ref Range Status   MRSA by PCR NEGATIVE NEGATIVE Final    Comment:        The GeneXpert MRSA Assay (FDA approved for NASAL specimens only), is one component of a comprehensive MRSA colonization surveillance program. It is not intended to diagnose MRSA infection nor to guide or monitor treatment for MRSA  infections.   CULTURE, BLOOD (ROUTINE X 2) w Reflex to ID Panel     Status: None (Preliminary result)   Collection Time: 10/26/16  5:38 PM  Result Value Ref Range Status   Specimen Description BLOOD RIGHT HAND  Final   Special Requests   Final    BOTTLES DRAWN AEROBIC AND ANAEROBIC AER 10ML ANA 7ML   Culture NO GROWTH 4 DAYS  Final   Report Status PENDING  Incomplete  CULTURE, BLOOD (ROUTINE X 2) w Reflex to ID Panel     Status: None (Preliminary result)   Collection Time: 10/26/16  7:03 PM  Result Value Ref Range Status   Specimen Description BLOOD RIGHT HAND  Final   Special Requests   Final    BOTTLES DRAWN AEROBIC AND ANAEROBIC AER 8ML ANA 5ML   Culture NO GROWTH 4 DAYS  Final   Report Status PENDING  Incomplete    RADIOLOGY:  No results found.  Follow  up with PCP in 1 week.  Management plans discussed with the patient, family and they are in agreement.  CODE STATUS:     Code Status Orders        Start     Ordered   10/24/16 0308  Do not attempt resuscitation (DNR)  Continuous    Question Answer Comment  In the event of cardiac or respiratory ARREST Do not call a "code blue"   In the event of cardiac or respiratory ARREST Do not perform Intubation, CPR, defibrillation or ACLS   In the event of cardiac or respiratory ARREST Use medication by any route, position, wound care, and other measures to relive pain and suffering. May use oxygen, suction and manual treatment of airway obstruction as needed for comfort.      10/24/16 [id number]    Code Status History    Date Active Date Inactive Code Status Order ID Comments User Context   09/28/2016  3:55 PM 10/02/2016  3:23 PM DNR [id number]  Altamese DillingVaibhavkumar Vachhani, MD Inpatient    Advance Directive Documentation   Flowsheet Row Most Recent Value  Type of Advance Directive  Out of facility DNR (pink MOST or yellow form)  Pre-existing out of facility DNR order (yellow form or pink MOST form)  No data  "MOST" Form in Place?  No data      TOTAL TIME TAKING CARE OF THIS PATIENT ON DAY OF DISCHARGE: more than 30 minutes.   Milagros LollSudini, Danyel Tobey R M.D on 10/30/2016 at 12:48 PM  Between 7am to 6pm - Pager - [phone]  After 6pm go to [email or web address] - password [email or web address]  [employer] [facility]  Office  [phone]  CC: Primary care physician; Pcp Not In System  Note: This dictation was prepared with Dragon dictation along with smaller phrase technology. Any transcriptional errors that result from this process are unintentional.

## 2016-10-31 LAB — CULTURE, BLOOD (ROUTINE X 2)
CULTURE: NO GROWTH
Culture: NO GROWTH

## 2016-11-09 ENCOUNTER — Encounter: Payer: Self-pay | Admitting: Gastroenterology

## 2016-11-09 ENCOUNTER — Ambulatory Visit (INDEPENDENT_AMBULATORY_CARE_PROVIDER_SITE_OTHER): Payer: Medicare Other | Admitting: Gastroenterology

## 2016-11-09 VITALS — BP 132/82 | HR 55 | Temp 97.9°F

## 2016-11-09 DIAGNOSIS — Z931 Gastrostomy status: Secondary | ICD-10-CM | POA: Diagnosis not present

## 2016-11-09 NOTE — Progress Notes (Signed)
Gastroenterology Consultation  Referring Provider:     No ref. provider found Primary Care Physician:  Pcp Not In System Primary Gastroenterologist:  Dr. Wyline Mood  Reason for Consultation:     PEG removal         HPI:   Mikayla [name] is a 76 y.o. y/o female referred for consultation & management  by Dr. Oneita Hurt Not In System.    Here with her family today . She recently was very sick in the hospital , stayed in the ICU, had a tracheostomy as well as a PEG tube for feeding . Recalls never used the PEG and now eating and drinking well. She is on coumadin for A fibb, no recent INR on file.     Past Medical History:  Diagnosis Date  . Atrial fibrillation (HCC)   . Empyema [facility])    in [facility] Aug 2017- stayed in hospital on for 2 months.  . Hypertension     No past surgical history on file.  Prior to Admission medications   Medication Sig Start Date End Date Taking? Authorizing Provider  acetaminophen (TYLENOL) 500 MG tablet Take 1,000 mg by mouth every 8 (eight) hours as needed for moderate pain or fever.   Yes Historical Provider, MD  amiodarone (PACERONE) 400 MG tablet Take 1 tablet (400 mg total) by mouth daily. 10/31/16  Yes Srikar Sudini, MD  amLODipine (NORVASC) 10 MG tablet Take 10 mg by mouth daily.   Yes Historical Provider, MD  carbidopa-levodopa (SINEMET IR) 10-100 MG tablet Take 1 tablet by mouth 3 (three) times daily.   Yes Historical Provider, MD  carvedilol (COREG) 12.5 MG tablet Take 12.5 mg by mouth 2 (two) times daily with a meal.   Yes Historical Provider, MD  celecoxib (CELEBREX) 100 MG capsule Take 100 mg by mouth 2 (two) times daily.   Yes Historical Provider, MD  digoxin (LANOXIN) 0.25 MG tablet Take 1 tablet (0.25 mg total) by mouth daily. 10/31/16  Yes Srikar Sudini, MD  furosemide (LASIX) 20 MG tablet Take 1 tablet (20 mg total) by mouth daily. 10/30/16  Yes Srikar Sudini, MD  gabapentin (NEURONTIN) 300 MG capsule Take 300 mg by mouth 3 (three) times daily.    Yes Historical Provider, MD  ipratropium-albuterol (DUONEB) 0.5-2.5 (3) MG/3ML SOLN Take 3 mLs by nebulization 3 (three) times daily.   Yes Historical Provider, MD  Melatonin 5 MG TABS Take 5 mg by mouth at bedtime.   Yes Historical Provider, MD  metoprolol tartrate (LOPRESSOR) 25 MG tablet Take 1 tablet (25 mg total) by mouth 2 (two) times daily. 10/30/16  Yes Srikar Sudini, MD  omeprazole (PRILOSEC OTC) 20 MG tablet Take 20 mg by mouth 2 (two) times daily.   Yes Historical Provider, MD  PARoxetine (PAXIL) 30 MG tablet Take 30 mg by mouth daily.    Yes Historical Provider, MD  potassium chloride SA (K-DUR,KLOR-CON) 20 MEQ tablet Take 20 mEq by mouth daily.   Yes Historical Provider, MD  sodium chloride (OCEAN) 0.65 % SOLN nasal spray Place 1 spray into both nostrils every 2 (two) hours as needed for congestion (allergies).   Yes Historical Provider, MD  traZODone (DESYREL) 50 MG tablet Take 25 mg by mouth at bedtime.   Yes Historical Provider, MD  warfarin (COUMADIN) 4 MG tablet Take 4 mg by mouth daily.   Yes Historical Provider, MD    Family History  Problem Relation Age of Onset  . Hypertension Mother  Social History  Substance Use Topics  . Smoking status: Former Smoker    Types: Cigarettes  . Smokeless tobacco: Former NeurosurgeonUser     Comment: quit in 2001. smoked for 30 years  . Alcohol use No    Allergies as of 11/09/2016 - Review Complete 11/09/2016  Allergen Reaction Noted  . Other Nausea And Vomiting 06/02/2016  . Sulfa antibiotics Hives 03/22/2015  . Oxycodone Anxiety and Other (See Comments) 08/25/2016    Review of Systems:    All systems reviewed and negative except where noted in HPI.   Physical Exam:  BP 132/82   Pulse (!) 55   Temp 97.9 F (36.6 C)  No LMP recorded. Patient is postmenopausal. Psych:  Alert and cooperative. Normal mood and affect.On a stretcher  General:   Alert,  Well-developed, well-nourished, pleasant and cooperative in NAD Head:   Normocephalic and atraumatic. Eyes:  Sclera clear, no icterus.   Conjunctiva pink. Ears:  Normal auditory acuity. Nose:  No deformity, discharge, or lesions. Mouth:  No deformity or lesions,oropharynx pink & moist. Neck:  Supple; no masses or thyromegaly. Lungs:  Respirations even and unlabored.  Clear throughout to auscultation.   No wheezes, crackles, or rhonchi. No acute distress. Heart:  Regular rate and rhythm; no murmurs, clicks, rubs, or gallops. Abdomen:  Normal bowel sounds.  No bruits.  Soft, non-tender and non-distended without masses, hepatosplenomegaly or hernias noted.  No guarding or rebound tenderness.  PEG tube  On left upper part of the abdomen , no evidence of infection , tube freely mobile , orifice not large .   Extremities:  No clubbing or edema.  No cyanosis. Neurologic:  Alert and oriented x3;  grossly normal neurologically. Psych:  Alert and cooperative. Normal mood and affect.  Imaging Studies: Dg Chest Port 1 View  Result Date: 10/24/2016 CLINICAL DATA:  PICC line on right. Pt unable to be fully supine for image, best image possible EXAM: PORTABLE CHEST 1 VIEW COMPARISON:  10/23/2016 FINDINGS: New right-sided PICC line tip overlies the level of the lower superior vena cava. Patchy infiltrate again and pleural effusion again identified at the right lung base, associated with volume loss. Numerous right rib fractures are again noted. Chronic changes are seen in the left shoulder. There is a small nodular density at the left lung base. There is no pulmonary edema. IMPRESSION: 1. Interval placement of right-sided PICC line, tip overlying the level of superior vena cava. 2. Stable appearance of atelectasis/consolidation at the right lung base associated with pleural effusion. 3. Right rib fractures. 4. Left lung nodule. As indicated, further characterization of this nodule could be performed with CT. Electronically Signed   By: Norva PavlovElizabeth  Brown M.D.   On: 10/24/2016 19:03    Dg Chest Port 1 View  Result Date: 10/23/2016 CLINICAL DATA:  Elevated white blood count. EXAM: PORTABLE CHEST 1 VIEW COMPARISON:  Radiograph of September 28, 2016. FINDINGS: Stable cardiomediastinal silhouette. No pneumothorax is noted. Left lung is clear. Atherosclerosis of thoracic aorta is noted. Moderately displaced right rib fractures are again noted. Stable right basilar opacity is noted consistent with atelectasis or scarring. Blunting of right costophrenic sulcus is noted most consistent with scarring. IMPRESSION: Stable moderately displaced right rib fractures. Aortic atherosclerosis. Stable right basilar atelectasis or scarring. Electronically Signed   By: Lupita RaiderJames  Green Jr, M.D.   On: 10/23/2016 16:58    Assessment and Plan:   Mikayla [name] is a 76 y.o. y/o female has been referred for PEG tube  removal , on coumadin for A fibb. The PEG tube was firmly in place, explained to pull it out , there is a possibility of a bleed and we do not have a recent INR . Also explained that there are no side effects of the tube remaining in place . I did mention that if removal was desired that would prefer an INR < 1.5 and may need holding of coumadin for a few days,family and patient decided that they didn't want to hold coumadin right now and will leave the PEG tube in place and they will contact me in a few months when they feel comfortable to go off coumadin and at that time we will get clearance from cardiology. I also explained we can take it out endoscopically but would rather avoid due to recent respiratory failure.   Follow up as needed   Dr Wyline Mood MD

## 2016-11-19 ENCOUNTER — Encounter: Payer: Self-pay | Admitting: Emergency Medicine

## 2016-11-19 ENCOUNTER — Observation Stay
Admission: EM | Admit: 2016-11-19 | Discharge: 2016-11-21 | Disposition: A | Discharge status: Skilled Nursing Facility | Payer: Medicare Other | Attending: Internal Medicine | Admitting: Internal Medicine

## 2016-11-19 ENCOUNTER — Telehealth: Payer: Self-pay

## 2016-11-19 DIAGNOSIS — R001 Bradycardia, unspecified: Secondary | ICD-10-CM | POA: Diagnosis not present

## 2016-11-19 DIAGNOSIS — F329 Major depressive disorder, single episode, unspecified: Secondary | ICD-10-CM | POA: Diagnosis not present

## 2016-11-19 DIAGNOSIS — K9423 Gastrostomy malfunction: Secondary | ICD-10-CM | POA: Diagnosis present

## 2016-11-19 DIAGNOSIS — L03311 Cellulitis of abdominal wall: Secondary | ICD-10-CM | POA: Diagnosis not present

## 2016-11-19 DIAGNOSIS — Z79899 Other long term (current) drug therapy: Secondary | ICD-10-CM | POA: Insufficient documentation

## 2016-11-19 DIAGNOSIS — I10 Essential (primary) hypertension: Secondary | ICD-10-CM | POA: Diagnosis not present

## 2016-11-19 DIAGNOSIS — F419 Anxiety disorder, unspecified: Secondary | ICD-10-CM | POA: Insufficient documentation

## 2016-11-19 DIAGNOSIS — L0390 Cellulitis, unspecified: Secondary | ICD-10-CM | POA: Diagnosis present

## 2016-11-19 DIAGNOSIS — Z66 Do not resuscitate: Secondary | ICD-10-CM | POA: Diagnosis not present

## 2016-11-19 DIAGNOSIS — J439 Emphysema, unspecified: Secondary | ICD-10-CM | POA: Insufficient documentation

## 2016-11-19 DIAGNOSIS — Z882 Allergy status to sulfonamides status: Secondary | ICD-10-CM | POA: Insufficient documentation

## 2016-11-19 DIAGNOSIS — Z87891 Personal history of nicotine dependence: Secondary | ICD-10-CM | POA: Diagnosis not present

## 2016-11-19 DIAGNOSIS — I4891 Unspecified atrial fibrillation: Secondary | ICD-10-CM | POA: Insufficient documentation

## 2016-11-19 DIAGNOSIS — G20 Parkinson's disease: Secondary | ICD-10-CM | POA: Diagnosis not present

## 2016-11-19 MED ORDER — VANCOMYCIN HCL IN DEXTROSE 1-5 GM/200ML-% IV SOLN
1000.0000 mg | Freq: Once | INTRAVENOUS | Status: AC
  Administered 2016-11-19: 1000 mg via INTRAVENOUS
  Filled 2016-11-19: qty 200

## 2016-11-19 NOTE — Telephone Encounter (Signed)
Patient has a 7cm by 12 cm abscess on her abdomen. Dr. Stann OreSlade-heartman saw her today in her facility and wants Dr. Tobi BastosAnna to see this patient as soon as possible. I don't see any appointments soon enough to schedule the patient. They want to know if you can make an opening to squeeze the patient in. I asked the nurse if one of our surgeons can see her but they are set on Dr. Tobi BastosAnna. Please call Nurse Frederich Chaebbie Smith at  [phone] and advice.

## 2016-11-19 NOTE — ED Provider Notes (Signed)
[facility] Emergency Department Provider Note   First MD Initiated Contact with Patient 11/19/16 2314     (approximate)  I have reviewed the triage vital signs and the nursing notes.   HISTORY  Chief Complaint Abscess    HPI Mikayla [name] is a 76 y.o. female with bullous chronic medical conditions presents with one-day history of redness and drainage around the PEG tube site. Patient denies any fever afebrile on presentation with Dr. 97.7. Per the patient's daughter area of redness has increased since first noticed by the nursing staff at [facility]   Past Medical History:  Diagnosis Date  . Atrial fibrillation (HCC)   . Empyema [facility])    in [facility] Aug 2017- stayed in hospital on for 2 months.  . Hypertension     Patient Active Problem List   Diagnosis Date Noted  . Sepsis (HCC) 10/23/2016  . Pressure injury of skin 09/30/2016  . Palliative care encounter   . Goals of care, counseling/discussion   . Encounter for hospice care discussion   . Septic shock (HCC) 09/28/2016  . Diarrhea 09/28/2016    Past Surgical History:  Procedure Laterality Date  . PEG TUBE PLACEMENT      Prior to Admission medications   Medication Sig Start Date End Date Taking? Authorizing Provider  acetaminophen (TYLENOL) 500 MG tablet Take 1,000 mg by mouth every 8 (eight) hours as needed for moderate pain or fever.   Yes Historical Provider, MD  amiodarone (PACERONE) 400 MG tablet Take 1 tablet (400 mg total) by mouth daily. Patient taking differently: Take 400 mg by mouth daily as needed. Hold for pulse less than 80 10/31/16  Yes Srikar Sudini, MD  amLODipine (NORVASC) 10 MG tablet Take 10 mg by mouth daily.   Yes Historical Provider, MD  carbidopa-levodopa (SINEMET IR) 10-100 MG tablet Take 1 tablet by mouth 3 (three) times daily.   Yes Historical Provider, MD  carvedilol (COREG) 12.5 MG tablet Take 12.5 mg by mouth 2 (two) times daily with a meal.   Yes Historical  Provider, MD  celecoxib (CELEBREX) 100 MG capsule Take 100 mg by mouth 2 (two) times daily.   Yes Historical Provider, MD  digoxin (LANOXIN) 0.25 MG tablet Take 1 tablet (0.25 mg total) by mouth daily. Patient taking differently: Take 0.25 mg by mouth every other day.  10/31/16  Yes Srikar Sudini, MD  furosemide (LASIX) 20 MG tablet Take 1 tablet (20 mg total) by mouth daily. 10/30/16  Yes Srikar Sudini, MD  gabapentin (NEURONTIN) 300 MG capsule Take 300 mg by mouth 3 (three) times daily.   Yes Historical Provider, MD  ipratropium-albuterol (DUONEB) 0.5-2.5 (3) MG/3ML SOLN Take 3 mLs by nebulization 3 (three) times daily.   Yes Historical Provider, MD  Melatonin 5 MG TABS Take 5 mg by mouth at bedtime.   Yes Historical Provider, MD  metoprolol tartrate (LOPRESSOR) 25 MG tablet Take 1 tablet (25 mg total) by mouth 2 (two) times daily. Patient taking differently: Take 12.5 mg by mouth 2 (two) times daily.  10/30/16  Yes Srikar Sudini, MD  Multiple Vitamin (MULTIVITAMIN WITH MINERALS) TABS tablet Take 1 tablet by mouth daily.   Yes Historical Provider, MD  omeprazole (PRILOSEC OTC) 20 MG tablet Take 20 mg by mouth 2 (two) times daily.   Yes Historical Provider, MD  PARoxetine (PAXIL) 30 MG tablet Take 30 mg by mouth daily.    Yes Historical Provider, MD  potassium chloride SA (K-DUR,KLOR-CON) 20  MEQ tablet Take 20 mEq by mouth daily.   Yes Historical Provider, MD  sodium chloride (OCEAN) 0.65 % SOLN nasal spray Place 1 spray into both nostrils every 2 (two) hours as needed for congestion (allergies).   Yes Historical Provider, MD  traZODone (DESYREL) 50 MG tablet Take 25 mg by mouth at bedtime.   Yes Historical Provider, MD    Allergies Other; Sulfa antibiotics; and Oxycodone  Family History  Problem Relation Age of Onset  . Hypertension Mother     Social History Social History  Substance Use Topics  . Smoking status: Former Smoker    Types: Cigarettes  . Smokeless tobacco: Former NeurosurgeonUser      Comment: quit in 2001. smoked for 30 years  . Alcohol use No    Review of Systems Constitutional: No fever/chills Eyes: No visual changes. ENT: No sore throat. Cardiovascular: Denies chest pain. Respiratory: Denies shortness of breath. Gastrointestinal: No abdominal pain.  No nausea, no vomiting.  No diarrhea.  No constipation. Genitourinary: Negative for dysuria. Musculoskeletal: Negative for back pain. Skin: Negative for rash.Positive for abdominal wall redness and drainage Neurological: Negative for headaches, focal weakness or numbness.  10-point ROS otherwise negative.  ____________________________________________   PHYSICAL EXAM:  VITAL SIGNS: ED Triage Vitals [11/19/16 2208]  Enc Vitals Group     BP 129/60     Pulse Rate (!) 50     Resp 13     Temp 97.7 F (36.5 C)     Temp Source Oral     SpO2 96 %     Weight 207 lb (93.9 kg)     Height 5\' 2"  (1.575 m)     Head Circumference      Peak Flow      Pain Score 0     Pain Loc      Pain Edu?      Excl. in GC?     Constitutional: Alert and oriented. Well appearing and in no acute distress. Eyes: Conjunctivae are normal. PERRL. EOMI. Head: Atraumatic. Mouth/Throat: Mucous membranes are moist.  Oropharynx non-erythematous. Neck: No stridor.   Cardiovascular: Normal rate, regular rhythm. Good peripheral circulation. Grossly normal heart sounds. Respiratory: Normal respiratory effort.  No retractions. Lungs CTAB. Gastrointestinal: Soft and nontender. No distention.  Musculoskeletal: No lower extremity tenderness nor edema. No gross deformities of extremities. Neurologic:  Normal speech and language. No gross focal neurologic deficits are appreciated.  Skin:  10 x 6 cm area of blanching erythema noted around PEG tube site Psychiatric: Mood and affect are normal. Speech and behavior are normal.  ____________________________________________   LABS (all labs ordered are listed, but only abnormal results are  displayed)  Labs Reviewed  CBC - Abnormal; Notable for the following:       Result Value   Hemoglobin 10.4 (*)    HCT 32.2 (*)    MCV 70.7 (*)    MCH 22.9 (*)    RDW 22.7 (*)    Platelets 543 (*)    All other components within normal limits  COMPREHENSIVE METABOLIC PANEL - Abnormal; Notable for the following:    Chloride 98 (*)    Calcium 8.3 (*)    Albumin 2.7 (*)    AST 13 (*)    ALT <5 (*)    All other components within normal limits  CULTURE, BLOOD (ROUTINE X 2)  CULTURE, BLOOD (ROUTINE X 2)     RADIOLOGY I, [place] N Dashauna Heymann, personally viewed and evaluated these images (plain radiographs)  as part of my medical decision making, as well as reviewing the written report by the radiologist.  Ct Abdomen Pelvis W Contrast  Result Date: 11/20/2016 CLINICAL DATA:  Acute onset of abdominal wall cellulitis about the patient's G-tube. Assess for abscess. Initial encounter. EXAM: CT ABDOMEN AND PELVIS WITH CONTRAST TECHNIQUE: Multidetector CT imaging of the abdomen and pelvis was performed using the standard protocol following bolus administration of intravenous contrast. CONTRAST:  ISOVUE-300 IOPAMIDOL (ISOVUE-300) INJECTION 61% COMPARISON:  None. FINDINGS: Lower chest: Trace complex right basilar pleural fluid is noted, with associated atelectasis or scarring. Calcified granulomata are noted at the lung bases bilaterally. Scattered coronary artery calcifications are seen. Calcification is noted at the aortic valve. Postoperative change is noted along the right chest wall, with overlying soft tissue thickening and defect. Hepatobiliary: The liver is unremarkable in appearance. Vague soft tissue inflammation about the gallbladder could reflect mild cholecystitis. Stones are noted within the gallbladder. The common bile duct is distended to 1.2 cm in diameter, raising concern for distal obstruction. Pancreas: The pancreas is within normal limits. Spleen: The spleen is unremarkable in  appearance. Adrenals/Urinary Tract: The adrenal glands are unremarkable in appearance. Small bilateral renal cysts are noted. Nonspecific perinephric stranding is noted bilaterally. There is no evidence of hydronephrosis. No renal or ureteral stones are identified. Stomach/Bowel: The patient's G-tube is noted embedded within the soft tissues of the anterior abdominal wall. It will likely require guidance for reinsertion, as the visualized tamping of the stomach to the abdominal wall is relatively tiny on this study. Adjacent soft tissue inflammation is seen, with trace subcutaneous soft tissue air and skin thickening. As there is no evidence of discoloration on clinical evaluation, this likely reflects air from the patient's G-tube rather than infection with a gas producing organism. The small bowel loops are unremarkable in appearance. Vague mild mucosal edema along the antrum of the stomach may be chronic in nature. The appendix is normal in caliber, without evidence of appendicitis. The colon is grossly unremarkable in appearance. Mild mucosal edema at the rectum is of uncertain significance. Vascular/Lymphatic: Scattered calcification is seen along the abdominal aorta and its branches. The abdominal aorta is otherwise grossly unremarkable. The inferior vena cava is grossly unremarkable. No retroperitoneal lymphadenopathy is seen. No pelvic sidewall lymphadenopathy is identified. Reproductive: The bladder is minimally distended and grossly unremarkable in appearance. The uterus is grossly unremarkable. The ovaries are relatively symmetric. No suspicious adnexal masses are seen. Other: Mild soft tissue edema is noted along the lateral abdominal wall bilaterally. Musculoskeletal: No acute osseous abnormalities are identified. Mild facet disease is noted along the lumbar spine. The visualized musculature is unremarkable in appearance. IMPRESSION: 1. G-tube noted embedded within the soft tissues of the anterior  abdominal wall. It will likely require guidance for reinsertion, as the visualized tamping of the stomach to the abdominal wall is relatively tiny on this study. 2. Adjacent soft tissue inflammation to the right of the G-tube, with trace subcutaneous soft tissue air and skin thickening. As there is no evidence of discoloration on clinical evaluation, this likely reflects air from the patient's G-tube rather than infection with a gas producing organism. 3. Vague soft tissue inflammation about the gallbladder could reflect mild cholecystitis. Cholelithiasis noted. Dilatation of the common bile duct to 1.2 cm in diameter, raising concern for distal obstruction. MRCP or ERCP could be considered for further evaluation, as deemed clinically appropriate. 4. Trace complex right basilar pleural fluid, with associated atelectasis or scarring. 5. Vague  mild mucosal edema along the antrum of the stomach may be chronic in nature. 6. Mild mucosal edema at the rectum, of uncertain significance. 7. Scattered coronary artery calcifications seen. Calcification at the aortic valve. 8. Postoperative change along the right chest wall, with overlying soft tissue thickening and defect. Would correlate with the patient's surgical history. 9. Small bilateral renal cysts noted. 10. Scattered aortic atherosclerosis. These results were called by telephone at the time of interpretation on 11/20/2016 at 1:58 am to Dr. Bayard Males, who verbally acknowledged these results. Electronically Signed   By: Roanna Raider M.D.   On: 11/20/2016 02:16     Procedures     INITIAL IMPRESSION / ASSESSMENT AND PLAN / ED COURSE  Pertinent labs & imaging results that were available during my care of the patient were reviewed by me and considered in my medical decision making (see chart for details).  76 year old female presents to the emergency department with abdominal wall cellulitis and dislodged G-tube. Patient received IV vancomycin after my  initial evaluation which revealed abdominal wall cellulitis. Patient discussed with Dr. Frederica Kuster admission for further management.     ____________________________________________  FINAL CLINICAL IMPRESSION(S) / ED DIAGNOSES  Final diagnoses:  Cellulitis of abdominal wall  PEG tube malfunction (HCC)     MEDICATIONS GIVEN DURING THIS VISIT:  Medications  vancomycin (VANCOCIN) IVPB 1000 mg/200 mL premix (0 mg Intravenous Stopped 11/20/16 0117)  iopamidol (ISOVUE-300) 61 % injection 100 mL (100 mLs Intravenous Contrast Given 11/20/16 0050)     NEW OUTPATIENT MEDICATIONS STARTED DURING THIS VISIT:  New Prescriptions   No medications on file    Modified Medications   No medications on file    Discontinued Medications   WARFARIN (COUMADIN) 4 MG TABLET    Take 4 mg by mouth daily.     Note:  This document was prepared using Dragon voice recognition software and may include unintentional dictation errors.    Darci Current, MD 11/20/16 [phone]

## 2016-11-19 NOTE — ED Triage Notes (Signed)
[facility] d/t abdominal abcess around peg tube. Drainage present.

## 2016-11-19 NOTE — Telephone Encounter (Signed)
Dr. Servando SnareWohl spoke with Dr. Lovenia KimSlade Hartman and it was decided pt needs to [facility] a [facility]. Pt will be scheduled to see Dr. Tonita CongWoodham tomorrow.

## 2016-11-20 ENCOUNTER — Encounter: Payer: Self-pay | Admitting: Radiology

## 2016-11-20 ENCOUNTER — Emergency Department: Payer: Medicare Other

## 2016-11-20 DIAGNOSIS — L0390 Cellulitis, unspecified: Secondary | ICD-10-CM | POA: Diagnosis present

## 2016-11-20 LAB — CBC
HCT: 32.2 % — ABNORMAL LOW (ref 35.0–47.0)
Hemoglobin: 10.4 g/dL — ABNORMAL LOW (ref 12.0–16.0)
MCH: 22.9 pg — AB (ref 26.0–34.0)
MCHC: 32.3 g/dL (ref 32.0–36.0)
MCV: 70.7 fL — ABNORMAL LOW (ref 80.0–100.0)
Platelets: 543 10*3/uL — ABNORMAL HIGH (ref 150–440)
RBC: 4.55 MIL/uL (ref 3.80–5.20)
RDW: 22.7 % — AB (ref 11.5–14.5)
WBC: 10 10*3/uL (ref 3.6–11.0)

## 2016-11-20 LAB — COMPREHENSIVE METABOLIC PANEL
ALBUMIN: 2.7 g/dL — AB (ref 3.5–5.0)
ALT: <5 U/L — ABNORMAL LOW (ref 14–54)
ANION GAP: 9 (ref 5–15)
AST: 13 U/L — ABNORMAL LOW (ref 15–41)
Alkaline Phosphatase: 83 U/L (ref 38–126)
BILIRUBIN TOTAL: 0.4 mg/dL (ref 0.3–1.2)
BUN: 10 mg/dL (ref 6–20)
CALCIUM: 8.3 mg/dL — AB (ref 8.9–10.3)
CO2: 30 mmol/L (ref 22–32)
Chloride: 98 mmol/L — ABNORMAL LOW (ref 101–111)
Creatinine, Ser: 0.8 mg/dL (ref 0.44–1.00)
GFR calc non Af Amer: >60 mL/min (ref >60)
GLUCOSE: 78 mg/dL (ref 65–99)
POTASSIUM: 3.6 mmol/L (ref 3.5–5.1)
SODIUM: 137 mmol/L (ref 135–145)
TOTAL PROTEIN: 7.1 g/dL (ref 6.5–8.1)

## 2016-11-20 LAB — PROTIME-INR
INR: 2.97
Prothrombin Time: 31.5 seconds — ABNORMAL HIGH (ref 11.4–15.2)

## 2016-11-20 LAB — MRSA PCR SCREENING: MRSA by PCR: NEGATIVE

## 2016-11-20 LAB — TSH: TSH: 3.111 u[IU]/mL (ref 0.350–4.500)

## 2016-11-20 LAB — GLUCOSE, CAPILLARY: Glucose-Capillary: 83 mg/dL (ref 65–99)

## 2016-11-20 LAB — DIGOXIN LEVEL: DIGOXIN LVL: 1.6 ng/mL (ref 0.8–2.0)

## 2016-11-20 MED ORDER — VANCOMYCIN HCL IN DEXTROSE 750-5 MG/150ML-% IV SOLN
750.0000 mg | Freq: Two times a day (BID) | INTRAVENOUS | Status: DC
  Administered 2016-11-20: 750 mg via INTRAVENOUS
  Filled 2016-11-20 (×3): qty 150

## 2016-11-20 MED ORDER — CARVEDILOL 6.25 MG PO TABS
6.2500 mg | ORAL_TABLET | Freq: Two times a day (BID) | ORAL | Status: DC
  Administered 2016-11-20 – 2016-11-21 (×2): 6.25 mg via ORAL
  Filled 2016-11-20 (×2): qty 1

## 2016-11-20 MED ORDER — VANCOMYCIN HCL IN DEXTROSE 1-5 GM/200ML-% IV SOLN
1000.0000 mg | INTRAVENOUS | Status: DC
  Administered 2016-11-21: 05:00:00 1000 mg via INTRAVENOUS
  Filled 2016-11-20 (×2): qty 200

## 2016-11-20 MED ORDER — SALINE SPRAY 0.65 % NA SOLN
1.0000 | NASAL | Status: DC | PRN
  Filled 2016-11-20: qty 44

## 2016-11-20 MED ORDER — POTASSIUM CHLORIDE CRYS ER 20 MEQ PO TBCR
20.0000 meq | EXTENDED_RELEASE_TABLET | Freq: Every day | ORAL | Status: DC
  Administered 2016-11-20 – 2016-11-21 (×2): 20 meq via ORAL
  Filled 2016-11-20 (×2): qty 1

## 2016-11-20 MED ORDER — CARVEDILOL 12.5 MG PO TABS
12.5000 mg | ORAL_TABLET | Freq: Two times a day (BID) | ORAL | Status: DC
  Filled 2016-11-20: qty 1

## 2016-11-20 MED ORDER — FUROSEMIDE 20 MG PO TABS
20.0000 mg | ORAL_TABLET | Freq: Every day | ORAL | Status: DC
  Administered 2016-11-20 – 2016-11-21 (×2): 20 mg via ORAL
  Filled 2016-11-20 (×2): qty 1

## 2016-11-20 MED ORDER — ENOXAPARIN SODIUM 40 MG/0.4ML ~~LOC~~ SOLN
40.0000 mg | SUBCUTANEOUS | Status: DC
  Administered 2016-11-20: 40 mg via SUBCUTANEOUS
  Filled 2016-11-20: qty 0.4

## 2016-11-20 MED ORDER — PIPERACILLIN-TAZOBACTAM 3.375 G IVPB
3.3750 g | Freq: Three times a day (TID) | INTRAVENOUS | Status: DC
  Administered 2016-11-20 – 2016-11-21 (×2): 3.375 g via INTRAVENOUS
  Filled 2016-11-20 (×2): qty 50

## 2016-11-20 MED ORDER — VANCOMYCIN HCL IN DEXTROSE 1-5 GM/200ML-% IV SOLN: 1000.0000 mg | Freq: Once | INTRAVENOUS | Status: DC

## 2016-11-20 MED ORDER — PAROXETINE HCL 10 MG PO TABS
30.0000 mg | ORAL_TABLET | Freq: Every day | ORAL | Status: DC
  Administered 2016-11-20 – 2016-11-21 (×2): 30 mg via ORAL
  Filled 2016-11-20 (×2): qty 3

## 2016-11-20 MED ORDER — AMLODIPINE BESYLATE 10 MG PO TABS
10.0000 mg | ORAL_TABLET | Freq: Every day | ORAL | Status: DC
  Administered 2016-11-20: 10 mg via ORAL
  Filled 2016-11-20: qty 1

## 2016-11-20 MED ORDER — AMLODIPINE BESYLATE 5 MG PO TABS
5.0000 mg | ORAL_TABLET | Freq: Every day | ORAL | Status: DC
  Administered 2016-11-21: 10:00:00 5 mg via ORAL
  Filled 2016-11-20: qty 1

## 2016-11-20 MED ORDER — ADULT MULTIVITAMIN W/MINERALS CH
1.0000 | ORAL_TABLET | Freq: Every day | ORAL | Status: DC
  Administered 2016-11-20 – 2016-11-21 (×2): 1 via ORAL
  Filled 2016-11-20 (×2): qty 1

## 2016-11-20 MED ORDER — GABAPENTIN 300 MG PO CAPS
300.0000 mg | ORAL_CAPSULE | Freq: Three times a day (TID) | ORAL | Status: DC
  Administered 2016-11-20 – 2016-11-21 (×4): 300 mg via ORAL
  Filled 2016-11-20 (×4): qty 1

## 2016-11-20 MED ORDER — DIGOXIN 250 MCG PO TABS
0.2500 mg | ORAL_TABLET | ORAL | Status: DC
  Administered 2016-11-21: 0.25 mg via ORAL
  Filled 2016-11-20: qty 1

## 2016-11-20 MED ORDER — DOCUSATE SODIUM 100 MG PO CAPS
100.0000 mg | ORAL_CAPSULE | Freq: Two times a day (BID) | ORAL | Status: DC
Start: 2016-11-20 — End: 2016-11-21
  Administered 2016-11-20 – 2016-11-21 (×3): 100 mg via ORAL
  Filled 2016-11-20 (×3): qty 1

## 2016-11-20 MED ORDER — ACETAMINOPHEN 650 MG RE SUPP: 650.0000 mg | Freq: Four times a day (QID) | RECTAL | Status: DC | PRN

## 2016-11-20 MED ORDER — VANCOMYCIN HCL IN DEXTROSE 750-5 MG/150ML-% IV SOLN: 750.0000 mg | Freq: Two times a day (BID) | INTRAVENOUS | Status: DC

## 2016-11-20 MED ORDER — TRAZODONE HCL 50 MG PO TABS
25.0000 mg | ORAL_TABLET | Freq: Every day | ORAL | Status: DC
  Administered 2016-11-20: 25 mg via ORAL
  Filled 2016-11-20: qty 1

## 2016-11-20 MED ORDER — AMIODARONE HCL 200 MG PO TABS
400.0000 mg | ORAL_TABLET | Freq: Every day | ORAL | Status: DC
  Administered 2016-11-20: 11:00:00 400 mg via ORAL
  Filled 2016-11-20: qty 2

## 2016-11-20 MED ORDER — PANTOPRAZOLE SODIUM 40 MG PO TBEC
40.0000 mg | DELAYED_RELEASE_TABLET | Freq: Two times a day (BID) | ORAL | Status: DC
Start: 2016-11-20 — End: 2016-11-21
  Administered 2016-11-20 – 2016-11-21 (×3): 40 mg via ORAL
  Filled 2016-11-20 (×3): qty 1

## 2016-11-20 MED ORDER — MELATONIN 5 MG PO TABS
5.0000 mg | ORAL_TABLET | Freq: Every day | ORAL | Status: DC
  Administered 2016-11-20: 23:00:00 5 mg via ORAL
  Filled 2016-11-20 (×2): qty 1

## 2016-11-20 MED ORDER — CARBIDOPA-LEVODOPA 10-100 MG PO TABS
1.0000 | ORAL_TABLET | Freq: Three times a day (TID) | ORAL | Status: DC
  Administered 2016-11-20 – 2016-11-21 (×4): 1 via ORAL
  Filled 2016-11-20 (×6): qty 1

## 2016-11-20 MED ORDER — ACETAMINOPHEN 325 MG PO TABS
650.0000 mg | ORAL_TABLET | Freq: Four times a day (QID) | ORAL | Status: DC | PRN
Start: 2016-11-20 — End: 2016-11-21

## 2016-11-20 MED ORDER — PIPERACILLIN-TAZOBACTAM 4.5 G IVPB
4.5000 g | Freq: Three times a day (TID) | INTRAVENOUS | Status: DC
  Administered 2016-11-20: 4.5 g via INTRAVENOUS
  Filled 2016-11-20 (×4): qty 100

## 2016-11-20 MED ORDER — IOPAMIDOL (ISOVUE-300) INJECTION 61%
100.0000 mL | Freq: Once | INTRAVENOUS | Status: AC | PRN
  Administered 2016-11-20: 100 mL via INTRAVENOUS

## 2016-11-20 MED ORDER — IPRATROPIUM-ALBUTEROL 0.5-2.5 (3) MG/3ML IN SOLN
3.0000 mL | Freq: Three times a day (TID) | RESPIRATORY_TRACT | Status: DC
  Administered 2016-11-20 (×2): 3 mL via RESPIRATORY_TRACT
  Filled 2016-11-20 (×3): qty 3

## 2016-11-20 MED ORDER — AMIODARONE HCL 200 MG PO TABS
200.0000 mg | ORAL_TABLET | Freq: Every day | ORAL | Status: DC
  Administered 2016-11-21: 200 mg via ORAL
  Filled 2016-11-20: qty 1

## 2016-11-20 MED ORDER — CELECOXIB 100 MG PO CAPS
100.0000 mg | ORAL_CAPSULE | Freq: Two times a day (BID) | ORAL | Status: DC
  Administered 2016-11-20 – 2016-11-21 (×3): 100 mg via ORAL
  Filled 2016-11-20 (×4): qty 1

## 2016-11-20 MED ORDER — METOPROLOL TARTRATE 25 MG PO TABS
12.5000 mg | ORAL_TABLET | Freq: Two times a day (BID) | ORAL | Status: DC
  Administered 2016-11-20 – 2016-11-21 (×2): 12.5 mg via ORAL
  Filled 2016-11-20 (×3): qty 1

## 2016-11-20 MED ORDER — PIPERACILLIN-TAZOBACTAM 3.375 G IVPB 30 MIN: 3.3750 g | Freq: Once | INTRAVENOUS | Status: DC

## 2016-11-20 NOTE — Progress Notes (Signed)
[employer] [facility] [place] [facility]   PATIENT NAME: Mikayla [name]    MR#:  [id number]  DATE OF BIRTH:  02/13/1941  SUBJECTIVE:  CHIEF COMPLAINT:   Chief Complaint  Patient presents with  . Abscess    Have Old PEG tube, not using it. GI wanted to remove it, but was worried due to high INR. For last 4 days - off COumadin due to high INR level. Sent today as redness and pain around PEG site. No other complains.  She also had bradycardia up to 30- for last few days, and was suppose to see cardiologist in office. REVIEW OF SYSTEMS:  CONSTITUTIONAL: No fever, fatigue or weakness.  EYES: No blurred or double vision.  EARS, NOSE, AND THROAT: No tinnitus or ear pain.  RESPIRATORY: No cough, shortness of breath, wheezing or hemoptysis.  CARDIOVASCULAR: No chest pain, orthopnea, edema.  GASTROINTESTINAL: No nausea, vomiting, diarrhea or abdominal pain.  GENITOURINARY: No dysuria, hematuria.  ENDOCRINE: No polyuria, nocturia,  HEMATOLOGY: No anemia, easy bruising or bleeding SKIN: No rash or lesion. MUSCULOSKELETAL: No joint pain or arthritis.   NEUROLOGIC: No tingling, numbness, weakness.  PSYCHIATRY: No anxiety or depression.   ROS  DRUG ALLERGIES:   Allergies  Allergen Reactions  . Other Nausea And Vomiting  . Sulfa Antibiotics Hives  . Oxycodone Anxiety and Other (See Comments)    VITALS:  Blood pressure (!) 138/49, pulse (!) 56, temperature 98 F (36.7 C), temperature source Oral, resp. rate 19, height 5\' 2"  (1.575 m), weight 87.5 kg (192 lb 14.4 oz), SpO2 97 %.  PHYSICAL EXAMINATION:  GENERAL:  76 y.o.-year-old patient lying in the bed with no acute distress.  EYES: Pupils equal, round, reactive to light and accommodation. No scleral icterus. Extraocular muscles intact.  HEENT: Head atraumatic, normocephalic. Oropharynx and nasopharynx clear.  NECK:  Supple, no jugular venous distention. No thyroid enlargement, no tenderness.  LUNGS: Normal breath  sounds bilaterally, no wheezing, rales,rhonchi or crepitation. No use of accessory muscles of respiration.  CARDIOVASCULAR: S1, S2 normal. No murmurs, rubs, or gallops.  ABDOMEN: Soft, epigastric tender, nondistended. Bowel sounds present. No organomegaly or mass. Redness and induration around the PEG insertion site. EXTREMITIES: No pedal edema, cyanosis, or clubbing.  NEUROLOGIC: Cranial nerves II through XII are intact. Muscle strength 3-4/5 in all extremities. Sensation intact. Gait not checked.  PSYCHIATRIC: The patient is alert and oriented x 3.  SKIN: No obvious rash, lesion, or ulcer.   Physical Exam LABORATORY PANEL:   CBC  Recent Labs Lab 11/19/16 2343  WBC 10.0  HGB 10.4*  HCT 32.2*  PLT 543*   ------------------------------------------------------------------------------------------------------------------  Chemistries   Recent Labs Lab 11/19/16 2343  NA 137  K 3.6  CL 98*  CO2 30  GLUCOSE 78  BUN 10  CREATININE 0.80  CALCIUM 8.3*  AST 13*  ALT <5*  ALKPHOS 83  BILITOT 0.4   ------------------------------------------------------------------------------------------------------------------  Cardiac Enzymes No results for input(s): TROPONINI in the last 168 hours. ------------------------------------------------------------------------------------------------------------------  RADIOLOGY:  Ct Abdomen Pelvis W Contrast  Result Date: 11/20/2016 CLINICAL DATA:  Acute onset of abdominal wall cellulitis about the patient's G-tube. Assess for abscess. Initial encounter. EXAM: CT ABDOMEN AND PELVIS WITH CONTRAST TECHNIQUE: Multidetector CT imaging of the abdomen and pelvis was performed using the standard protocol following bolus administration of intravenous contrast. CONTRAST:  ISOVUE-300 IOPAMIDOL (ISOVUE-300) INJECTION 61% COMPARISON:  None. FINDINGS: Lower chest: Trace complex right basilar pleural fluid is noted, with associated atelectasis or scarring.  Calcified granulomata are noted at the lung bases bilaterally. Scattered coronary artery calcifications are seen. Calcification is noted at the aortic valve. Postoperative change is noted along the right chest wall, with overlying soft tissue thickening and defect. Hepatobiliary: The liver is unremarkable in appearance. Vague soft tissue inflammation about the gallbladder could reflect mild cholecystitis. Stones are noted within the gallbladder. The common bile duct is distended to 1.2 cm in diameter, raising concern for distal obstruction. Pancreas: The pancreas is within normal limits. Spleen: The spleen is unremarkable in appearance. Adrenals/Urinary Tract: The adrenal glands are unremarkable in appearance. Small bilateral renal cysts are noted. Nonspecific perinephric stranding is noted bilaterally. There is no evidence of hydronephrosis. No renal or ureteral stones are identified. Stomach/Bowel: The patient's G-tube is noted embedded within the soft tissues of the anterior abdominal wall. It will likely require guidance for reinsertion, as the visualized tamping of the stomach to the abdominal wall is relatively tiny on this study. Adjacent soft tissue inflammation is seen, with trace subcutaneous soft tissue air and skin thickening. As there is no evidence of discoloration on clinical evaluation, this likely reflects air from the patient's G-tube rather than infection with a gas producing organism. The small bowel loops are unremarkable in appearance. Vague mild mucosal edema along the antrum of the stomach may be chronic in nature. The appendix is normal in caliber, without evidence of appendicitis. The colon is grossly unremarkable in appearance. Mild mucosal edema at the rectum is of uncertain significance. Vascular/Lymphatic: Scattered calcification is seen along the abdominal aorta and its branches. The abdominal aorta is otherwise grossly unremarkable. The inferior vena cava is grossly unremarkable. No  retroperitoneal lymphadenopathy is seen. No pelvic sidewall lymphadenopathy is identified. Reproductive: The bladder is minimally distended and grossly unremarkable in appearance. The uterus is grossly unremarkable. The ovaries are relatively symmetric. No suspicious adnexal masses are seen. Other: Mild soft tissue edema is noted along the lateral abdominal wall bilaterally. Musculoskeletal: No acute osseous abnormalities are identified. Mild facet disease is noted along the lumbar spine. The visualized musculature is unremarkable in appearance. IMPRESSION: 1. G-tube noted embedded within the soft tissues of the anterior abdominal wall. It will likely require guidance for reinsertion, as the visualized tamping of the stomach to the abdominal wall is relatively tiny on this study. 2. Adjacent soft tissue inflammation to the right of the G-tube, with trace subcutaneous soft tissue air and skin thickening. As there is no evidence of discoloration on clinical evaluation, this likely reflects air from the patient's G-tube rather than infection with a gas producing organism. 3. Vague soft tissue inflammation about the gallbladder could reflect mild cholecystitis. Cholelithiasis noted. Dilatation of the common bile duct to 1.2 cm in diameter, raising concern for distal obstruction. MRCP or ERCP could be considered for further evaluation, as deemed clinically appropriate. 4. Trace complex right basilar pleural fluid, with associated atelectasis or scarring. 5. Vague mild mucosal edema along the antrum of the stomach may be chronic in nature. 6. Mild mucosal edema at the rectum, of uncertain significance. 7. Scattered coronary artery calcifications seen. Calcification at the aortic valve. 8. Postoperative change along the right chest wall, with overlying soft tissue thickening and defect. Would correlate with the patient's surgical history. 9. Small bilateral renal cysts noted. 10. Scattered aortic atherosclerosis. These  results were called by telephone at the time of interpretation on 11/20/2016 at 1:58 am to Dr. Bayard Males, who verbally acknowledged these results. Electronically Signed   By: Roanna Raider  M.D.   On: 11/20/2016 02:16    ASSESSMENT AND PLAN:   Active Problems:   Cellulitis  * Cellulitis   Vanc and Zosyn.   PEG removed by Sx, appreciated help.   May switch to oral quick.  * A fib with rate controlled   Oral amio and coreg   Coumadin was on hold for few days before high INR.    * bradycardia   Decreased dose of coreg and amio.   Check Digoxin level.   Monitor tonight , may need out pt follow up with Dr. Welton FlakesKhan.  * Hypertension   Under control.   Decrease dose of coreg.  * parkinsons disease   Cont sinemet.  * Emphysema   Duoneb as needed.     All the records are reviewed and case discussed with Care Management/Social Workerr and pt's daughter in room. Management plans discussed with the patient, family and they are in agreement.  CODE STATUS: Full.  TOTAL TIME TAKING CARE OF THIS PATIENT: 35 minutes.     POSSIBLE D/C IN 2-3 DAYS, DEPENDING ON CLINICAL CONDITION.   Altamese DillingVACHHANI, Xitlaly Ault M.D on 11/20/2016   Between 7am to 6pm - Pager - [phone]  After 6pm go to [email or web address] - password [email or web address]  [facility]  Office  [phone]  CC: Primary care physician; Pcp Not In System  Note: This dictation was prepared with Dragon dictation along with smaller phrase technology. Any transcriptional errors that result from this process are unintentional.

## 2016-11-20 NOTE — Progress Notes (Signed)
Patient ID: Mikayla [name], female   DOB: 06/21/1941, 76 y.o.   MRN: [id number]  CC: Abdominal Pain  HPI Mikayla [name] is a 76 y.o. female who is currently admitted to the medicine service for treatment of an abdominal wall infection. General surgery was consult to evaluate whether or not her PEG tube can be removed by Dr.Vachhani. Patient reports a history of emphysema where she recently had a prolonged hospital stay secondary to respiratory failure. Per report the nursing staff at her nursing home sent her to the hospital to be evaluated for possible infection the tube site. She has not been using the tube for some time and she is been eating by mouth. Patient is unsure how long the area has been read but knows that it is causing her discomfort. She denies any fevers, chills, chest pain, diarrhea, constipation. She has her chronic shortness of breath.  HPI  Past Medical History:  Diagnosis Date  . Atrial fibrillation (HCC)   . Empyema [facility](HCC)    in [facility] Aug 2017- stayed in hospital on for 2 months.  . Hypertension     Past Surgical History:  Procedure Laterality Date  . PEG TUBE PLACEMENT      Family History  Problem Relation Age of Onset  . Hypertension Mother     Social History Social History  Substance Use Topics  . Smoking status: Former Smoker    Types: Cigarettes  . Smokeless tobacco: Former NeurosurgeonUser     Comment: quit in 2001. smoked for 30 years  . Alcohol use No    Allergies  Allergen Reactions  . Other Nausea And Vomiting  . Sulfa Antibiotics Hives  . Oxycodone Anxiety and Other (See Comments)    Current Facility-Administered Medications  Medication Dose Route Frequency Provider Last Rate Last Dose  . acetaminophen (TYLENOL) tablet 650 mg  650 mg Oral Q6H PRN Arnaldo NatalMichael S Diamond, MD       Or  . acetaminophen (TYLENOL) suppository 650 mg  650 mg Rectal Q6H PRN Arnaldo NatalMichael S Diamond, MD      . Melene Muller[START ON 11/21/2016] amiodarone (PACERONE) tablet 200 mg  200 mg Oral  Daily Altamese DillingVaibhavkumar Vachhani, MD      . Melene Muller[START ON 11/21/2016] amLODipine (NORVASC) tablet 5 mg  5 mg Oral Daily Altamese DillingVaibhavkumar Vachhani, MD      . carbidopa-levodopa (SINEMET IR) 10-100 MG per tablet immediate release 1 tablet  1 tablet Oral TID Arnaldo NatalMichael S Diamond, MD   1 tablet at 11/20/16 1034  . carvedilol (COREG) tablet 6.25 mg  6.25 mg Oral BID WC Altamese DillingVaibhavkumar Vachhani, MD      . celecoxib (CELEBREX) capsule 100 mg  100 mg Oral BID Arnaldo NatalMichael S Diamond, MD   100 mg at 11/20/16 1034  . [START ON 11/21/2016] digoxin (LANOXIN) tablet 0.25 mg  0.25 mg Oral QODAY Arnaldo NatalMichael S Diamond, MD      . docusate sodium (COLACE) capsule 100 mg  100 mg Oral BID Arnaldo NatalMichael S Diamond, MD   100 mg at 11/20/16 1034  . enoxaparin (LOVENOX) injection 40 mg  40 mg Subcutaneous Q24H Arnaldo NatalMichael S Diamond, MD      . furosemide (LASIX) tablet 20 mg  20 mg Oral Daily Arnaldo NatalMichael S Diamond, MD   20 mg at 11/20/16 1034  . gabapentin (NEURONTIN) capsule 300 mg  300 mg Oral TID Arnaldo NatalMichael S Diamond, MD   300 mg at 11/20/16 1034  . ipratropium-albuterol (DUONEB) 0.5-2.5 (3) MG/3ML nebulizer solution 3 mL  3 mL  Nebulization TID Arnaldo Natal, MD   3 mL at 11/20/16 1350  . Melatonin TABS 5 mg  5 mg Oral QHS Arnaldo Natal, MD      . metoprolol tartrate (LOPRESSOR) tablet 12.5 mg  12.5 mg Oral BID Arnaldo Natal, MD      . multivitamin with minerals tablet 1 tablet  1 tablet Oral Daily Arnaldo Natal, MD   1 tablet at 11/20/16 1032  . pantoprazole (PROTONIX) EC tablet 40 mg  40 mg Oral BID Arnaldo Natal, MD   40 mg at 11/20/16 1032  . PARoxetine (PAXIL) tablet 30 mg  30 mg Oral Daily Arnaldo Natal, MD   30 mg at 11/20/16 1032  . piperacillin-tazobactam (ZOSYN) IVPB 3.375 g  3.375 g Intravenous Q8H Arnaldo Natal, MD      . potassium chloride SA (K-DUR,KLOR-CON) CR tablet 20 mEq  20 mEq Oral Daily Arnaldo Natal, MD   20 mEq at 11/20/16 1034  . sodium chloride (OCEAN) 0.65 % nasal spray 1 spray  1 spray Each Nare Q2H PRN  Arnaldo Natal, MD      . traZODone (DESYREL) tablet 25 mg  25 mg Oral QHS Arnaldo Natal, MD      . Melene Muller ON 11/21/2016] vancomycin (VANCOCIN) IVPB 1000 mg/200 mL premix  1,000 mg Intravenous Q18H Arnaldo Natal, MD         Review of Systems A Tie point review of systems was asked and was negative except for the findings documented in the history of present illness  Physical Exam Blood pressure (!) 127/49, pulse (!) 55, temperature 97.9 F (36.6 C), temperature source Oral, resp. rate 14, height 5\' 2"  (1.575 m), weight 87.5 kg (192 lb 14.4 oz), SpO2 95 %. CONSTITUTIONAL: Resting in bed in no acute distress. EYES: Pupils are equal, round, and reactive to light, Sclera are non-icteric. EARS, NOSE, MOUTH AND THROAT: The oropharynx is clear. The oral mucosa is pink and moist. Hearing is intact to voice. LYMPH NODES:  Lymph nodes in the neck are normal. RESPIRATORY:  Lungs are coarse throughout. There is normal respiratory effort, with equal breath sounds bilaterally, and without pathologic use of accessory muscles. CARDIOVASCULAR: Heart is regular without murmurs, gallops, or rubs. GI: The abdomen is soft, tender to palpation at the PEG tube site with some medial erythema, and nondistended. There are no palpable masses. There is no hepatosplenomegaly. There are normal bowel sounds in all quadrants. GU: Rectal deferred.   MUSCULOSKELETAL: Normal muscle strength and tone. No cyanosis or edema.   SKIN: Turgor is good and there are no pathologic skin lesions or ulcers. NEUROLOGIC: Motor and sensation is grossly normal. Cranial nerves are grossly intact. PSYCH:  Oriented to person, place and time. Affect is normal.  Data Reviewed Images and labs reviewed. Labs are primarily within normal limits with the exception of a mild anemia 10.4 with 32.2. CT of the abdomen shows that the PEG tube flange is in the subcutaneous tissues and not within the stomach. I have personally reviewed the  patient's imaging, laboratory findings and medical records.    Assessment    Abdominal wall infection    Plan    76 year old female with abdominal wall infection I clearly related to her dislodged PEG tube. The PEG tube was removed at the bedside without any difficulty with a plain dressing placed over it. Counseled the patient that we'll continue to have some drainage for several days until the tract  heals up. I do not feel a definite abscess related to this and it is likely just cellulitis. Discussed with the patient that should she develop an abscess surgery might be required to drain it however at this time I do not see an indication for drainage. Patient is very happy to have her PEG tube removed. Thank you for allowing [place] to participate in this patient's care, please call again if the general surgery department and be of further assistance.     Time spent with the patient was 55 minutes, with more than 50% of the time spent in face-to-face education, counseling and care coordination.     Ricarda Frame, MD FACS General Surgeon 11/20/2016, 4:33 PM 7a-7p [facility] 4218 7p-7a [facility] 4219

## 2016-11-20 NOTE — ED Notes (Signed)
Admitting MD at bedside.

## 2016-11-20 NOTE — ED Notes (Signed)
Pts daughter, Rayfield CitizenCaroline, called and updated about plan of care.  Pts daughter informed that pt is up for admission.

## 2016-11-20 NOTE — ED Notes (Signed)
Patient transported to CT 

## 2016-11-20 NOTE — Progress Notes (Signed)
Pharmacy Antibiotic Note  Mikayla [name] is a 76 y.o. female admitted on 11/19/2016 with cellulitis.  Pharmacy has been consulted for vancomycin and Zosyn dosing.  Plan: DW 68kg  Vd 48L kei 0.058 hr-1  T1/2 12 hours Vancomycin 750 mg q 12 hours ordered with stacked dosing. Level before 5th dose. Goal trough 15-20.  Zosyn 4.5 grams q 8 hours ordered for Pseudomonas risk of abx usage in last 3 months.  Height: 5\' 2"  (157.5 cm) Weight: 207 lb (93.9 kg) IBW/kg (Calculated) : 50.1  Temp (24hrs), Avg:97.7 F (36.5 C), Min:97.7 F (36.5 C), Max:97.7 F (36.5 C)   Recent Labs Lab 11/19/16 2343  WBC 10.0  CREATININE 0.80    Estimated Creatinine Clearance: 63.8 mL/min (by C-G formula based on SCr of 0.8 mg/dL).    Allergies  Allergen Reactions  . Other Nausea And Vomiting  . Sulfa Antibiotics Hives  . Oxycodone Anxiety and Other (See Comments)    Antimicrobials this admission: vancomycin 1/26 >> \ Zosyn  1/26 >>   Dose adjustments this admission:   Microbiology results: 1/25 BCx: pending 10/25/16 MRSA PCR: (-)   Thank you for allowing pharmacy to be a part of this patient's care.  Zehra Rucci S 11/20/2016 5:54 AM

## 2016-11-20 NOTE — ED Notes (Signed)
Went to check pt brief, pt requested to be left alone to sleep for now..Mikayla [name]

## 2016-11-20 NOTE — H&P (Signed)
Mikayla [name] is an 76 y.o. female.   Chief Complaint: Concern for abscess HPI: The patient with past medical history of emphysema status post prolonged hospital stay due to respiratory failure 6 months ago presents emergency department due to an area of redness on her abdomen at her PEG tube site that nursing home staff was concerned may be infected. In the emergency department the patient underwent a CT of the abdomen which showed that her PEG tube was no longer in place and was actually lodged in the abdominal wall. Notably the patient has not been receiving tube feedings for some time now. She is able to eat all of her meals by mouth. She received a dose of vancomycin in the emergency department for possible cellulitis. Due to concern for MRSA infection due to her health care facility emergency department staff requested admission.  Past Medical History:  Diagnosis Date  . Atrial fibrillation ([place])   . Empyema [facility])    in [facility] Aug 2017- stayed in hospital on for 2 months.  . Hypertension     Past Surgical History:  Procedure Laterality Date  . PEG TUBE PLACEMENT      Family History  Problem Relation Age of Onset  . Hypertension Mother    Social History:  reports that she has quit smoking. Her smoking use included Cigarettes. She has quit using smokeless tobacco. She reports that she does not drink alcohol or use drugs.  Allergies:  Allergies  Allergen Reactions  . Other Nausea And Vomiting  . Sulfa Antibiotics Hives  . Oxycodone Anxiety and Other (See Comments)    Prior to Admission medications   Medication Sig Start Date End Date Taking? Authorizing Provider  acetaminophen (TYLENOL) 500 MG tablet Take 1,000 mg by mouth every 8 (eight) hours as needed for moderate pain or fever.   Yes Historical Provider, MD  amiodarone (PACERONE) 400 MG tablet Take 1 tablet (400 mg total) by mouth daily. Patient taking differently: Take 400 mg by mouth daily as needed. Hold for pulse  less than 80 10/31/16  Yes Srikar Sudini, MD  amLODipine (NORVASC) 10 MG tablet Take 10 mg by mouth daily.   Yes Historical Provider, MD  carbidopa-levodopa (SINEMET IR) 10-100 MG tablet Take 1 tablet by mouth 3 (three) times daily.   Yes Historical Provider, MD  carvedilol (COREG) 12.5 MG tablet Take 12.5 mg by mouth 2 (two) times daily with a meal.   Yes Historical Provider, MD  celecoxib (CELEBREX) 100 MG capsule Take 100 mg by mouth 2 (two) times daily.   Yes Historical Provider, MD  digoxin (LANOXIN) 0.25 MG tablet Take 1 tablet (0.25 mg total) by mouth daily. Patient taking differently: Take 0.25 mg by mouth every other day.  10/31/16  Yes Srikar Sudini, MD  furosemide (LASIX) 20 MG tablet Take 1 tablet (20 mg total) by mouth daily. 10/30/16  Yes Srikar Sudini, MD  gabapentin (NEURONTIN) 300 MG capsule Take 300 mg by mouth 3 (three) times daily.   Yes Historical Provider, MD  ipratropium-albuterol (DUONEB) 0.5-2.5 (3) MG/3ML SOLN Take 3 mLs by nebulization 3 (three) times daily.   Yes Historical Provider, MD  Melatonin 5 MG TABS Take 5 mg by mouth at bedtime.   Yes Historical Provider, MD  metoprolol tartrate (LOPRESSOR) 25 MG tablet Take 1 tablet (25 mg total) by mouth 2 (two) times daily. Patient taking differently: Take 12.5 mg by mouth 2 (two) times daily.  10/30/16  Yes Hillary Bow, MD  Multiple Vitamin (  MULTIVITAMIN WITH MINERALS) TABS tablet Take 1 tablet by mouth daily.   Yes Historical Provider, MD  omeprazole (PRILOSEC OTC) 20 MG tablet Take 20 mg by mouth 2 (two) times daily.   Yes Historical Provider, MD  PARoxetine (PAXIL) 30 MG tablet Take 30 mg by mouth daily.    Yes Historical Provider, MD  potassium chloride SA (K-DUR,KLOR-CON) 20 MEQ tablet Take 20 mEq by mouth daily.   Yes Historical Provider, MD  sodium chloride (OCEAN) 0.65 % SOLN nasal spray Place 1 spray into both nostrils every 2 (two) hours as needed for congestion (allergies).   Yes Historical Provider, MD  traZODone  (DESYREL) 50 MG tablet Take 25 mg by mouth at bedtime.   Yes Historical Provider, MD     Results for orders placed or performed during the hospital encounter of 11/19/16 (from the past 48 hour(s))  CBC     Status: Abnormal   Collection Time: 11/19/16 11:43 PM  Result Value Ref Range   WBC 10.0 3.6 - 11.0 K/uL   RBC 4.55 3.80 - 5.20 MIL/uL   Hemoglobin 10.4 (L) 12.0 - 16.0 g/dL   HCT 32.2 (L) 35.0 - 47.0 %   MCV 70.7 (L) 80.0 - 100.0 fL   MCH 22.9 (L) 26.0 - 34.0 pg   MCHC 32.3 32.0 - 36.0 g/dL   RDW 22.7 (H) 11.5 - 14.5 %   Platelets 543 (H) 150 - 440 K/uL  Comprehensive metabolic panel     Status: Abnormal   Collection Time: 11/19/16 11:43 PM  Result Value Ref Range   Sodium 137 135 - 145 mmol/L   Potassium 3.6 3.5 - 5.1 mmol/L   Chloride 98 (L) 101 - 111 mmol/L   CO2 30 22 - 32 mmol/L   Glucose, Bld 78 65 - 99 mg/dL   BUN 10 6 - 20 mg/dL   Creatinine, Ser 0.80 0.44 - 1.00 mg/dL   Calcium 8.3 (L) 8.9 - 10.3 mg/dL   Total Protein 7.1 6.5 - 8.1 g/dL   Albumin 2.7 (L) 3.5 - 5.0 g/dL   AST 13 (L) 15 - 41 U/L   ALT <5 (L) 14 - 54 U/L   Alkaline Phosphatase 83 38 - 126 U/L   Total Bilirubin 0.4 0.3 - 1.2 mg/dL   GFR calc non Af Amer >60 >60 mL/min   GFR calc Af Amer >60 >60 mL/min    Comment: (NOTE) The eGFR has been calculated using the CKD EPI equation. This calculation has not been validated in all clinical situations. eGFR's persistently <60 mL/min signify possible Chronic Kidney Disease.    Anion gap 9 5 - 15  Glucose, capillary     Status: None   Collection Time: 11/20/16  5:16 AM  Result Value Ref Range   Glucose-Capillary 83 65 - 99 mg/dL   Ct Abdomen Pelvis W Contrast  Result Date: 11/20/2016 CLINICAL DATA:  Acute onset of abdominal wall cellulitis about the patient's G-tube. Assess for abscess. Initial encounter. EXAM: CT ABDOMEN AND PELVIS WITH CONTRAST TECHNIQUE: Multidetector CT imaging of the abdomen and pelvis was performed using the standard protocol  following bolus administration of intravenous contrast. CONTRAST:  147m ISOVUE-300 IOPAMIDOL (ISOVUE-300) INJECTION 61% COMPARISON:  None. FINDINGS: Lower chest: Trace complex right basilar pleural fluid is noted, with associated atelectasis or scarring. Calcified granulomata are noted at the lung bases bilaterally. Scattered coronary artery calcifications are seen. Calcification is noted at the aortic valve. Postoperative change is noted along the right chest wall, with  overlying soft tissue thickening and defect. Hepatobiliary: The liver is unremarkable in appearance. Vague soft tissue inflammation about the gallbladder could reflect mild cholecystitis. Stones are noted within the gallbladder. The common bile duct is distended to 1.2 cm in diameter, raising concern for distal obstruction. Pancreas: The pancreas is within normal limits. Spleen: The spleen is unremarkable in appearance. Adrenals/Urinary Tract: The adrenal glands are unremarkable in appearance. Small bilateral renal cysts are noted. Nonspecific perinephric stranding is noted bilaterally. There is no evidence of hydronephrosis. No renal or ureteral stones are identified. Stomach/Bowel: The patient's G-tube is noted embedded within the soft tissues of the anterior abdominal wall. It will likely require guidance for reinsertion, as the visualized tamping of the stomach to the abdominal wall is relatively tiny on this study. Adjacent soft tissue inflammation is seen, with trace subcutaneous soft tissue air and skin thickening. As there is no evidence of discoloration on clinical evaluation, this likely reflects air from the patient's G-tube rather than infection with a gas producing organism. The small bowel loops are unremarkable in appearance. Vague mild mucosal edema along the antrum of the stomach may be chronic in nature. The appendix is normal in caliber, without evidence of appendicitis. The colon is grossly unremarkable in appearance. Mild  mucosal edema at the rectum is of uncertain significance. Vascular/Lymphatic: Scattered calcification is seen along the abdominal aorta and its branches. The abdominal aorta is otherwise grossly unremarkable. The inferior vena cava is grossly unremarkable. No retroperitoneal lymphadenopathy is seen. No pelvic sidewall lymphadenopathy is identified. Reproductive: The bladder is minimally distended and grossly unremarkable in appearance. The uterus is grossly unremarkable. The ovaries are relatively symmetric. No suspicious adnexal masses are seen. Other: Mild soft tissue edema is noted along the lateral abdominal wall bilaterally. Musculoskeletal: No acute osseous abnormalities are identified. Mild facet disease is noted along the lumbar spine. The visualized musculature is unremarkable in appearance. IMPRESSION: 1. G-tube noted embedded within the soft tissues of the anterior abdominal wall. It will likely require guidance for reinsertion, as the visualized tamping of the stomach to the abdominal wall is relatively tiny on this study. 2. Adjacent soft tissue inflammation to the right of the G-tube, with trace subcutaneous soft tissue air and skin thickening. As there is no evidence of discoloration on clinical evaluation, this likely reflects air from the patient's G-tube rather than infection with a gas producing organism. 3. Vague soft tissue inflammation about the gallbladder could reflect mild cholecystitis. Cholelithiasis noted. Dilatation of the common bile duct to 1.2 cm in diameter, raising concern for distal obstruction. MRCP or ERCP could be considered for further evaluation, as deemed clinically appropriate. 4. Trace complex right basilar pleural fluid, with associated atelectasis or scarring. 5. Vague mild mucosal edema along the antrum of the stomach may be chronic in nature. 6. Mild mucosal edema at the rectum, of uncertain significance. 7. Scattered coronary artery calcifications seen. Calcification  at the aortic valve. 8. Postoperative change along the right chest wall, with overlying soft tissue thickening and defect. Would correlate with the patient's surgical history. 9. Small bilateral renal cysts noted. 10. Scattered aortic atherosclerosis. These results were called by telephone at the time of interpretation on 11/20/2016 at 1:58 am to Dr. Marjean Donna, who verbally acknowledged these results. Electronically Signed   By: Garald Balding M.D.   On: 11/20/2016 02:16    Review of Systems  Constitutional: Negative for chills and fever.  HENT: Negative for sore throat and tinnitus.   Eyes: Negative for  blurred vision and redness.  Respiratory: Negative for cough and shortness of breath.   Cardiovascular: Negative for chest pain, palpitations, orthopnea and PND.  Gastrointestinal: Positive for abdominal pain. Negative for diarrhea, nausea and vomiting.  Genitourinary: Negative for dysuria, frequency and urgency.  Musculoskeletal: Negative for joint pain and myalgias.  Skin: Negative for rash.       No lesions  Neurological: Negative for speech change, focal weakness and weakness.  Endo/Heme/Allergies: Does not bruise/bleed easily.       No temperature intolerance  Psychiatric/Behavioral: Negative for depression and suicidal ideas.    Blood pressure (!) 107/47, pulse (!) 51, temperature 97.7 F (36.5 C), temperature source Oral, resp. rate 13, height _0  (1.575 m), weight 93.9 kg (207 lb), SpO2 94 %. Physical Exam  Vitals reviewed. Constitutional: She is oriented to person, place, and time. She appears well-developed and well-nourished. No distress.  HENT:  Head: Normocephalic and atraumatic.  Mouth/Throat: Oropharynx is clear and moist.  Eyes: Conjunctivae and EOM are normal. Pupils are equal, round, and reactive to light. No scleral icterus.  Neck: Normal range of motion. Neck supple. No JVD present. No tracheal deviation present. No thyromegaly present.  Cardiovascular: Normal  rate, regular rhythm and normal heart sounds.  Exam reveals no gallop and no friction rub.   No murmur heard. Respiratory: Effort normal and breath sounds normal.  GI: Soft. Bowel sounds are normal. She exhibits no distension. There is no tenderness.  Genitourinary:  Genitourinary Comments: Deferred  Musculoskeletal: She exhibits no edema.  Lymphadenopathy:    She has no cervical adenopathy.  Neurological: She is alert and oriented to person, place, and time. No cranial nerve deficit. She exhibits normal muscle tone.  Skin: Skin is warm and dry. No rash noted. There is erythema (3 cm oval area at PEG tube site with some mild subcutaneous emphysema; mildly tender to palpation).  Psychiatric: She has a normal mood and affect. Her behavior is normal. Judgment and thought content normal.     Assessment/Plan This is a 76 year old female admitted for cellulitis. 1. Cellulitis: Localized; mild gas seen on CT scan within tissue planes likely represents air from the PEG tube and not gas producing bacteria. The patient does not meet criteria for sepsis. She feels well. She has already received 1 dose of vancomycin in the emergency department. I have admitted the patient for IV Zosyn and possibly another dose of vancomycin. She is allergic to sulfa antibiotics. 2. Hypertension: Controlled (blood pressure is actually on the low side). Continue Norvasc and carvedilol 3. Atrial fibrillation: Rate controlled; continue amiodarone, digoxin and metoprolol (note to beta blocker therapy). She is electively not on systemic anticoagulation as she is a fall risk. 4. Parkinsonian features: Continue Sinemet 5. Emphysema: DuoNeb's as needed 6. Depression/anxiety: Continue Paxil 7. DVT prophylaxis: Lovenox 8. GI prophylaxis: PPI per home regimen The patient is a DO NOT RESUSCITATE. Time spent on admission orders and patient care approximately 45 minutes  Harrie Foreman, MD 11/20/2016, 7:13 AM

## 2016-11-20 NOTE — ED Notes (Signed)
ED Provider at bedside. 

## 2016-11-20 NOTE — Progress Notes (Signed)
Pharmacy Antibiotic Note  Mikayla [name] is a 76 y.o. female admitted on 11/19/2016 with cellulitis.  Pharmacy has been consulted for vancomycin and Zosyn dosing.  Plan: Change in weight from 93.9 kg to 87.5 kg. Current orders for Vancomycin 750 mg q 12 hours ordered with stacked dosing. Will change regimen to vancomycin 1000 mg IV q18h. Level before 5th dose. Goal trough 15-20.   Zosyn 3.375 g IV q 8 hours.  Height: 5\' 2"  (157.5 cm) Weight: 192 lb 14.4 oz (87.5 kg) IBW/kg (Calculated) : 50.1  Temp (24hrs), Avg:97.8 F (36.6 C), Min:97.7 F (36.5 C), Max:97.9 F (36.6 C)   Recent Labs Lab 11/19/16 2343  WBC 10.0  CREATININE 0.80    Estimated Creatinine Clearance: 61.5 mL/min (by C-G formula based on SCr of 0.8 mg/dL).    Allergies  Allergen Reactions  . Other Nausea And Vomiting  . Sulfa Antibiotics Hives  . Oxycodone Anxiety and Other (See Comments)    Antimicrobials this admission: vancomycin 1/26 >> \ Zosyn  1/26 >>   Dose adjustments this admission:   Microbiology results: 1/25 BCx: pending 10/25/16 MRSA PCR: (-)   Thank you for allowing pharmacy to be a part of this patient's care.  Marty HeckWang, Michaelangelo Mittelman L 11/20/2016 4:06 PM

## 2016-11-20 NOTE — Clinical Social Work Note (Signed)
Clinical Social Work Assessment  Patient Details  Name: Mikayla [name] MRN: [id number] Date of Birth: 11/06/1940  Date of referral:  11/20/16               Reason for consult:  Other (Comment Required) (From [facility] (SNF, LTC) )                Permission sought to share information with:  Chartered certified accountant granted to share information::  Yes, Verbal Permission Granted  Name::      [employer]::   [place]   Relationship::     Contact Information:     Housing/Transportation Living arrangements for the past 2 months:  [place] of Information:  Patient, Adult Children Patient Interpreter Needed:  None Criminal Activity/Legal Involvement Pertinent to Current Situation/Hospitalization:  No - Comment as needed Significant Relationships:  Adult Children Lives with:  Facility Resident Do you feel safe going back to the place where you live?  Yes Need for family participation in patient care:  Yes (Comment)  Care giving concerns:  Patient is a long term care resident at [facility].    Social Worker assessment / plan:  Holiday representative ([place]) received verbal consult from MD that patient is from [facility]. Per Neoma Laming admissions coordinator at [facility] patient is a long term care resident and can return when stable. Per Neoma Laming patient will go back to room 301, RN will call report to C-[place] and D/C Summary will be faxed to Marlaine Hind at [phone]. CSW met with patient and her daughter Mikayla [name] at bedside. Patient was alert and oriented and laying in the bed. Patient and daughter confirmed that patient is from [facility] and will return when stable for D/C. Per daughter Mikayla [name] tube will be taken out once patient is stable. Daughter is aware patient may D/C tomorrow. CSW contacted patient's other daughter Mikayla [name] and made her aware of above. FL2 complete. CSW will continue to follow and assist  as needed.   Employment status:  Retired, Disabled (Comment on whether or not currently receiving Disability) Insurance information:  Managed Medicare PT Recommendations:  Not assessed at this time Information / Referral to community resources:  [place]  Patient/Family's Response to care:  Patient and daughters are agreeable for patient to return to [place].   Patient/Family's Understanding of and Emotional Response to Diagnosis, Current Treatment, and Prognosis:  Patient and daughters were pleasant and thanked CSW for visit.   Emotional Assessment Appearance:  Appears stated age Attitude/Demeanor/Rapport:    Affect (typically observed):  Accepting, Adaptable, Pleasant Orientation:  Oriented to Self, Oriented to Place, Oriented to  Time, Oriented to Situation Alcohol / Substance use:  Not Applicable Psych involvement (Current and /or in the community):  No (Comment)  Discharge Needs  Concerns to be addressed:  Discharge Planning Concerns Readmission within the last 30 days:  No Current discharge risk:  Dependent with Mobility Barriers to Discharge:  Continued Medical Work up   [employer], Veronia Beets, LCSW 11/20/2016, 4:40 PM

## 2016-11-20 NOTE — ED Notes (Signed)
Report to [place], RN for room 116

## 2016-11-20 NOTE — NC FL2 (Signed)
[place] MEDICAID FL2 LEVEL OF CARE SCREENING TOOL     IDENTIFICATION  Patient Name: Mikayla [name] Birthdate: 13-Apr-1941 Sex: female Admission Date (Current Location): 11/19/2016  [place] and [place] Number:  Chiropodist and Address:  [facility], [address] [place], [place] [address]      Provider Number: [id number]  Attending Physician Name and Address:  Altamese Dilling, MD  Relative Name and Phone Number:       Current Level of Care: Hospital Recommended Level of Care: Skilled Nursing Facility Prior Approval Number:    Date Approved/Denied:   PASRR Number:  [id number] )  Discharge Plan: SNF    Current Diagnoses: Patient Active Problem List   Diagnosis Date Noted  . Cellulitis 11/20/2016  . Sepsis (HCC) 10/23/2016  . Pressure injury of skin 09/30/2016  . Palliative care encounter   . Goals of care, counseling/discussion   . Encounter for hospice care discussion   . Septic shock (HCC) 09/28/2016  . Diarrhea 09/28/2016    Orientation RESPIRATION BLADDER Height & Weight     Self, Situation, Time, Place  Normal Incontinent Weight: 192 lb 14.4 oz (87.5 kg) Height:  5\' 2"  (157.5 cm)  BEHAVIORAL SYMPTOMS/MOOD NEUROLOGICAL BOWEL NUTRITION STATUS   (none)  (none) Incontinent Diet (Diet: Heart Healthy )  AMBULATORY STATUS COMMUNICATION OF NEEDS Skin   Extensive Assist Verbally Normal                       Personal Care Assistance Level of Assistance  Bathing, Feeding, Dressing Bathing Assistance: Limited assistance Feeding assistance: Independent Dressing Assistance: Limited assistance     Functional Limitations Info  Sight, Hearing, Speech Sight Info: Adequate Hearing Info: Adequate Speech Info: Adequate    SPECIAL CARE FACTORS FREQUENCY                       Contractures      Additional Factors Info  Code Status, Allergies, Isolation Precautions Code Status Info:  (DNR ) Allergies  Info:  (Other, Sulfa Antibiotics, Oxycodone)     Isolation Precautions Info:  (MRSA)     Current Medications (11/20/2016):  This is the current hospital active medication list Current Facility-Administered Medications  Medication Dose Route Frequency Provider Last Rate Last Dose  . acetaminophen (TYLENOL) tablet 650 mg  650 mg Oral Q6H PRN Arnaldo Natal, MD       Or  . acetaminophen (TYLENOL) suppository 650 mg  650 mg Rectal Q6H PRN Arnaldo Natal, MD      . Melene Muller ON 11/21/2016] amiodarone (PACERONE) tablet 200 mg  200 mg Oral Daily Altamese Dilling, MD      . Melene Muller ON 11/21/2016] amLODipine (NORVASC) tablet 5 mg  5 mg Oral Daily Altamese Dilling, MD      . carbidopa-levodopa (SINEMET IR) 10-100 MG per tablet immediate release 1 tablet  1 tablet Oral TID Arnaldo Natal, MD   1 tablet at 11/20/16 1034  . carvedilol (COREG) tablet 6.25 mg  6.25 mg Oral BID WC Altamese Dilling, MD      . celecoxib (CELEBREX) capsule 100 mg  100 mg Oral BID Arnaldo Natal, MD   100 mg at 11/20/16 1034  . [START ON 11/21/2016] digoxin (LANOXIN) tablet 0.25 mg  0.25 mg Oral QODAY Arnaldo Natal, MD      . docusate sodium (COLACE) capsule 100 mg  100 mg Oral BID Arnaldo Natal, MD  100 mg at 11/20/16 1034  . enoxaparin (LOVENOX) injection 40 mg  40 mg Subcutaneous Q24H Arnaldo NatalMichael S Diamond, MD      . furosemide (LASIX) tablet 20 mg  20 mg Oral Daily Arnaldo NatalMichael S Diamond, MD   20 mg at 11/20/16 1034  . gabapentin (NEURONTIN) capsule 300 mg  300 mg Oral TID Arnaldo NatalMichael S Diamond, MD   300 mg at 11/20/16 1034  . ipratropium-albuterol (DUONEB) 0.5-2.5 (3) MG/3ML nebulizer solution 3 mL  3 mL Nebulization TID Arnaldo NatalMichael S Diamond, MD   3 mL at 11/20/16 1350  . Melatonin TABS 5 mg  5 mg Oral QHS Arnaldo NatalMichael S Diamond, MD      . metoprolol tartrate (LOPRESSOR) tablet 12.5 mg  12.5 mg Oral BID Arnaldo NatalMichael S Diamond, MD      . multivitamin with minerals tablet 1 tablet  1 tablet Oral Daily Arnaldo NatalMichael S Diamond, MD    1 tablet at 11/20/16 1032  . pantoprazole (PROTONIX) EC tablet 40 mg  40 mg Oral BID Arnaldo NatalMichael S Diamond, MD   40 mg at 11/20/16 1032  . PARoxetine (PAXIL) tablet 30 mg  30 mg Oral Daily Arnaldo NatalMichael S Diamond, MD   30 mg at 11/20/16 1032  . piperacillin-tazobactam (ZOSYN) IVPB 3.375 g  3.375 g Intravenous Q8H Arnaldo NatalMichael S Diamond, MD      . potassium chloride SA (K-DUR,KLOR-CON) CR tablet 20 mEq  20 mEq Oral Daily Arnaldo NatalMichael S Diamond, MD   20 mEq at 11/20/16 1034  . sodium chloride (OCEAN) 0.65 % nasal spray 1 spray  1 spray Each Nare Q2H PRN Arnaldo NatalMichael S Diamond, MD      . traZODone (DESYREL) tablet 25 mg  25 mg Oral QHS Arnaldo NatalMichael S Diamond, MD      . Melene Muller[START ON 11/21/2016] vancomycin (VANCOCIN) IVPB 1000 mg/200 mL premix  1,000 mg Intravenous Q18H Arnaldo NatalMichael S Diamond, MD         Discharge Medications: Please see discharge summary for a list of discharge medications.  Relevant Imaging Results:  Relevant Lab Results:   Additional Information  (SSN: [id number])  Sample, Darleen CrockerBailey M, LCSW

## 2016-11-21 LAB — HEMOGLOBIN A1C
Hgb A1c MFr Bld: 6 % — ABNORMAL HIGH (ref 4.8–5.6)
Mean Plasma Glucose: 126 mg/dL

## 2016-11-21 MED ORDER — WARFARIN SODIUM 4 MG PO TABS: 4.0000 mg | ORAL_TABLET | Freq: Every day | ORAL | Status: DC

## 2016-11-21 MED ORDER — AMOXICILLIN-POT CLAVULANATE 875-125 MG PO TABS: 1.0000 | ORAL_TABLET | Freq: Two times a day (BID) | ORAL | 0 refills | Status: DC

## 2016-11-21 MED ORDER — CARVEDILOL 6.25 MG PO TABS: 6.2500 mg | ORAL_TABLET | Freq: Two times a day (BID) | ORAL | Status: DC

## 2016-11-21 MED ORDER — AMIODARONE HCL 200 MG PO TABS: 200.0000 mg | ORAL_TABLET | Freq: Every day | ORAL | Status: DC

## 2016-11-21 MED ORDER — AMOXICILLIN-POT CLAVULANATE 875-125 MG PO TABS
1.0000 | ORAL_TABLET | Freq: Two times a day (BID) | ORAL | Status: DC
  Administered 2016-11-21: 10:00:00 1 via ORAL
  Filled 2016-11-21: qty 1

## 2016-11-21 NOTE — Progress Notes (Signed)
[facility]. Alert and orient x4 in no apparent distress. [facility] [place] [facility].

## 2016-11-21 NOTE — Care Management Obs Status (Signed)
MEDICARE OBSERVATION STATUS NOTIFICATION   Patient Details  Name: Mikayla [name] MRN: [id number] Date of Birth: 10/31/1940   Medicare Observation Status Notification Given:  Yes    Othniel Maret A, RN 11/21/2016, 2:11 PM

## 2016-11-21 NOTE — Progress Notes (Signed)
Patient discharged, left via EMS in no distress.

## 2016-11-21 NOTE — Discharge Instructions (Signed)
Recommend twice-daily dressing changes until drainage stops from the PEG tube site. Fall and aspiration precaution. Resume coumadin and follow up INR.

## 2016-11-21 NOTE — Progress Notes (Signed)
  Wound site from previous PEG tube examined again today. Surrounding erythema appears to be improved., Drainage from PEG site continues and is expected. No indications for further surgical intervention at this time. Recommend twice-daily dressing changes until drainage stops from the PEG tube site.   Please call if the Gen. surgery team can be of further assistance with this patient.  Ricarda Frameharles Jovan Colligan, MD [facility] General Surgeon [facility] 7a-7p - [facility] [id number] 7p-7a - Arnette FeltsASCOM [phone]

## 2016-11-21 NOTE — Discharge Summary (Signed)
[facility] [place] [facility]   PATIENT NAME: Mikayla [name]    MR#:  [id number]  DATE OF BIRTH:  11/07/1940  DATE OF ADMISSION:  11/19/2016   ADMITTING PHYSICIAN: Arnaldo NatalMichael S Diamond, MD  DATE OF DISCHARGE: 11/21/2016  PRIMARY CARE PHYSICIAN: Pcp Not In System   ADMISSION DIAGNOSIS:  Cellulitis of abdominal wall [L03.311] PEG tube malfunction (HCC) [K94.23] DISCHARGE DIAGNOSIS:  Active Problems:   Cellulitis  SECONDARY DIAGNOSIS:   Past Medical History:  Diagnosis Date  . Atrial fibrillation (HCC)   . Empyema [facility](HCC)    in [facility] Aug 2017- stayed in hospital on for 2 months.  . Hypertension    HOSPITAL COURSE:   * Cellulitis of PEG site.   She was treated with Vanc and Zosyn.   PEG removed by Sx, appreciated help. Dressing change bid.   Change to po augmentin for 5 days..  * A fib with rate controlled   Oral amio and coreg   Coumadin was on hold for few days before high INR.   INR is 2.97, resume home dose coumadin, f/u INR in SNF.  * bradycardia   Decreased dose of coreg and amio.   Hold Digoxin.  * Hypertension   Decrease dose of coreg. Discontinue norvasc and lopressor due to low BP. Hold if BP is low.  * parkinsons disease   Cont sinemet.  * Emphysema   Duoneb as needed.  DISCHARGE CONDITIONS:  Stable, discharge to SNF today. CONSULTS OBTAINED:  Treatment Team:  Ricarda Frameharles Woodham, MD DRUG ALLERGIES:   Allergies  Allergen Reactions  . Other Nausea And Vomiting  . Sulfa Antibiotics Hives  . Oxycodone Anxiety and Other (See Comments)   DISCHARGE MEDICATIONS:   Allergies as of 11/21/2016      Reactions   Other Nausea And Vomiting   Sulfa Antibiotics Hives   Oxycodone Anxiety, Other (See Comments)      Medication List    STOP taking these medications   amLODipine 10 MG tablet Commonly known as:  NORVASC   digoxin 0.25 MG tablet Commonly known as:  LANOXIN   metoprolol tartrate 25 MG tablet Commonly known as:   LOPRESSOR     TAKE these medications   acetaminophen 500 MG tablet Commonly known as:  TYLENOL Take 1,000 mg by mouth every 8 (eight) hours as needed for moderate pain or fever.   amiodarone 200 MG tablet Commonly known as:  PACERONE Take 1 tablet (200 mg total) by mouth daily. Start taking on:  11/22/2016 What changed:  medication strength  how much to take   amoxicillin-clavulanate 875-125 MG tablet Commonly known as:  AUGMENTIN Take 1 tablet by mouth every 12 (twelve) hours.   carbidopa-levodopa 10-100 MG tablet Commonly known as:  SINEMET IR Take 1 tablet by mouth 3 (three) times daily.   carvedilol 6.25 MG tablet Commonly known as:  COREG Take 1 tablet (6.25 mg total) by mouth 2 (two) times daily with a meal. What changed:  medication strength  how much to take   celecoxib 100 MG capsule Commonly known as:  CELEBREX Take 100 mg by mouth 2 (two) times daily.   furosemide 20 MG tablet Commonly known as:  LASIX Take 1 tablet (20 mg total) by mouth daily.   gabapentin 300 MG capsule Commonly known as:  NEURONTIN Take 300 mg by mouth 3 (three) times daily.   ipratropium-albuterol 0.5-2.5 (3) MG/3ML Soln Commonly known as:  DUONEB Take 3 mLs by nebulization 3 (three)  times daily.   Melatonin 5 MG Tabs Take 5 mg by mouth at bedtime.   multivitamin with minerals Tabs tablet Take 1 tablet by mouth daily.   omeprazole 20 MG tablet Commonly known as:  PRILOSEC OTC Take 20 mg by mouth 2 (two) times daily.   PARoxetine 30 MG tablet Commonly known as:  PAXIL Take 30 mg by mouth daily.   potassium chloride SA 20 MEQ tablet Commonly known as:  K-DUR,KLOR-CON Take 20 mEq by mouth daily.   sodium chloride 0.65 % Soln nasal spray Commonly known as:  OCEAN Place 1 spray into both nostrils every 2 (two) hours as needed for congestion (allergies).   traZODone 50 MG tablet Commonly known as:  DESYREL Take 25 mg by mouth at bedtime.   warfarin 4 MG  tablet Commonly known as:  COUMADIN Take 1 tablet (4 mg total) by mouth daily.        DISCHARGE INSTRUCTIONS:  See AVS.  If you experience worsening of your admission symptoms, develop shortness of breath, life threatening emergency, suicidal or homicidal thoughts you must seek medical attention immediately by calling 911 or calling your MD immediately  if symptoms less severe.  You Must read complete instructions/literature along with all the possible adverse reactions/side effects for all the Medicines you take and that have been prescribed to you. Take any new Medicines after you have completely understood and accpet all the possible adverse reactions/side effects.   Please note  You were cared for by a hospitalist during your hospital stay. If you have any questions about your discharge medications or the care you received while you were in the hospital after you are discharged, you can call the unit and asked to speak with the hospitalist on call if the hospitalist that took care of you is not available. Once you are discharged, your primary care physician will handle any further medical issues. Please note that NO REFILLS for any discharge medications will be authorized once you are discharged, as it is imperative that you return to your primary care physician (or establish a relationship with a primary care physician if you do not have one) for your aftercare needs so that they can reassess your need for medications and monitor your lab values.    On the day of Discharge:  VITAL SIGNS:  Blood pressure (!) 108/51, pulse (!) 57, temperature 97.9 F (36.6 C), temperature source Oral, resp. rate 17, height 5\' 2"  (1.575 m), weight 195 lb 9.6 oz (88.7 kg), SpO2 94 %. PHYSICAL EXAMINATION:  GENERAL:  76 y.o.-year-old patient lying in the bed with no acute distress. Obese. EYES: Pupils equal, round, reactive to light and accommodation. No scleral icterus. Extraocular muscles intact.   HEENT: Head atraumatic, normocephalic. Oropharynx and nasopharynx clear.  NECK:  Supple, no jugular venous distention. No thyroid enlargement, no tenderness.  LUNGS: Normal breath sounds bilaterally, no wheezing, rales,rhonchi or crepitation. No use of accessory muscles of respiration.  CARDIOVASCULAR: S1, S2 normal. No murmurs, rubs, or gallops.  ABDOMEN: Soft, non-tender, non-distended. Bowel sounds present. No organomegaly or mass.  EXTREMITIES: No pedal edema, cyanosis, or clubbing.  NEUROLOGIC: Cranial nerves II through XII are intact. Muscle strength 5/5 in all extremities. Sensation intact. Gait not checked.  PSYCHIATRIC: The patient is alert and oriented x 3.  SKIN: No obvious rash, lesion, or ulcer.  DATA REVIEW:   CBC  Recent Labs Lab 11/19/16 2343  WBC 10.0  HGB 10.4*  HCT 32.2*  PLT 543*  Chemistries   Recent Labs Lab 11/19/16 2343  NA 137  K 3.6  CL 98*  CO2 30  GLUCOSE 78  BUN 10  CREATININE 0.80  CALCIUM 8.3*  AST 13*  ALT <5*  ALKPHOS 83  BILITOT 0.4     Microbiology Results  Results for orders placed or performed during the hospital encounter of 11/19/16  Blood culture (routine x 2)     Status: None (Preliminary result)   Collection Time: 11/19/16 11:43 PM  Result Value Ref Range Status   Specimen Description BLOOD LEFT FOREARM  Final   Special Requests BOTTLES DRAWN AEROBIC AND ANAEROBIC AER9CC ANA10CC  Final   Culture NO GROWTH < 12 HOURS  Final   Report Status PENDING  Incomplete  Blood culture (routine x 2)     Status: None (Preliminary result)   Collection Time: 11/19/16 11:43 PM  Result Value Ref Range Status   Specimen Description BLOOD LEFT ARM  Final   Special Requests BOTTLES DRAWN AEROBIC AND ANAEROBIC AER10CC ANA9CC  Final   Culture NO GROWTH < 12 HOURS  Final   Report Status PENDING  Incomplete  MRSA PCR Screening     Status: None   Collection Time: 11/20/16  9:23 AM  Result Value Ref Range Status   MRSA by PCR NEGATIVE  NEGATIVE Final    Comment:        The GeneXpert MRSA Assay (FDA approved for NASAL specimens only), is one component of a comprehensive MRSA colonization surveillance program. It is not intended to diagnose MRSA infection nor to guide or monitor treatment for MRSA infections.     RADIOLOGY:  No results found.   Management plans discussed with the patient, 2 daughtgers and they are in agreement.  CODE STATUS:     Code Status Orders        Start     Ordered   11/20/16 0904  Do not attempt resuscitation (DNR)  Continuous    Question Answer Comment  In the event of cardiac or respiratory ARREST Do not call a "code blue"   In the event of cardiac or respiratory ARREST Do not perform Intubation, CPR, defibrillation or ACLS   In the event of cardiac or respiratory ARREST Use medication by any route, position, wound care, and other measures to relive pain and suffering. May use oxygen, suction and manual treatment of airway obstruction as needed for comfort.      11/20/16 1610    Code Status History    Date Active Date Inactive Code Status Order ID Comments User Context   10/24/2016  3:07 AM 10/30/2016  9:26 PM DNR [id number]  Shaune Pollack, MD Inpatient   09/28/2016  3:55 PM 10/02/2016  3:23 PM DNR [id number]  Altamese Dilling, MD Inpatient    Advance Directive Documentation   Flowsheet Row Most Recent Value  Type of Advance Directive  Out of facility DNR (pink MOST or yellow form)  Pre-existing out of facility DNR order (yellow form or pink MOST form)  No data  "MOST" Form in Place?  No data      TOTAL TIME TAKING CARE OF THIS PATIENT: 43 minutes.    Shaune Pollack M.D on 11/21/2016 at 1:27 PM  Between 7am to 6pm - Pager - [phone]  After 6pm go to [email or web address] - Social research officer, government  [facility]  Office  [phone]  CC: Primary care physician; Pcp Not In System   Note: This dictation was prepared with Dragon dictation along  with smaller  phrase technology. Any transcriptional errors that result from this process are unintentional.

## 2016-11-21 NOTE — Plan of Care (Signed)
Problem: Safety: Goal: Ability to remain free from injury will improve Outcome: Progressing Pt remains safe in bed, bed alarm on, has made no attempts to get out of bed unattended

## 2016-11-21 NOTE — Clinical Social Work Note (Signed)
[facility] The facility and family are aware. FL 2 and DC summary sent to facility. Packet delivered. CSW signing off.  Argentina PonderKaren Martha Selia Wareing, MSW [employer]WA [phone]

## 2016-11-25 LAB — CULTURE, BLOOD (ROUTINE X 2)
CULTURE: NO GROWTH
CULTURE: NO GROWTH

## 2017-07-09 IMAGING — CT CT ABD-PELV W/ CM
2 of 8 series · 13 of 46 positions shown, 15 images · IV contrast (APPLIED)
Comparison: None.

CLINICAL DATA: Acute onset of abdominal wall cellulitis about the
patient's G-tube. Assess for abscess. Initial encounter.

EXAM:
CT ABDOMEN AND PELVIS WITH CONTRAST
TECHNIQUE: Multidetector CT imaging of the abdomen and pelvis was performed
using the standard protocol following bolus administration of
intravenous contrast.
CONTRAST:  100mL 6P5M3V-6II IOPAMIDOL (6P5M3V-6II) INJECTION 61%

[Series 5: routine abd/pel with · axial · 0.98mm/px · z∈[-1113,-718]mm · 10 of 97 slices shown, 12 images]
[im 9/97  soft-tissue]
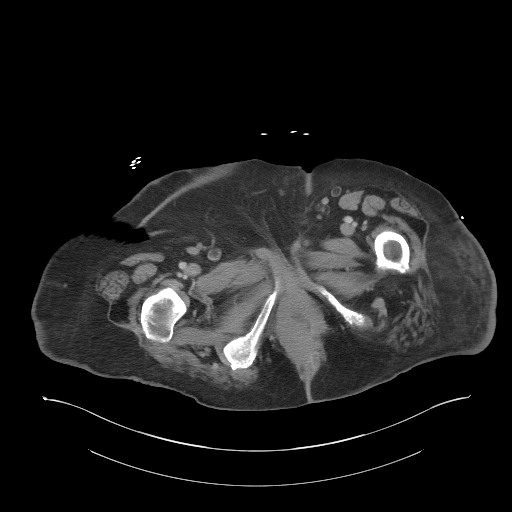
[im 9/97  bone]
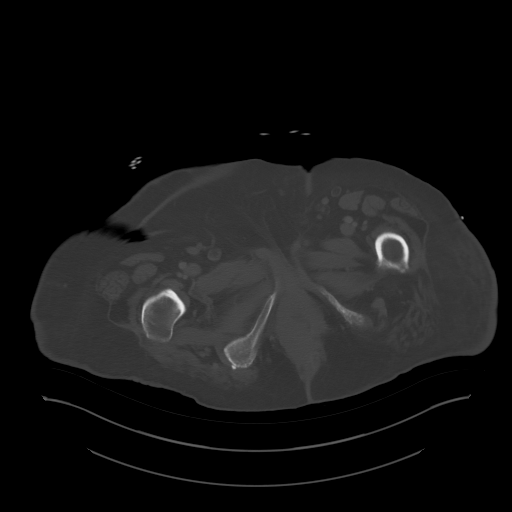
[im 18/97  soft-tissue]
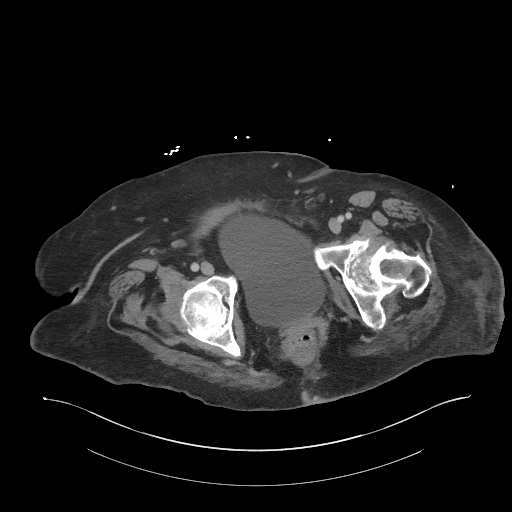
[im 27/97  soft-tissue]
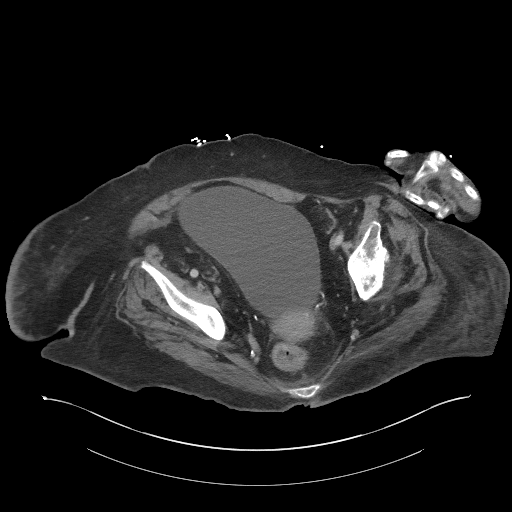
[im 35/97  soft-tissue]
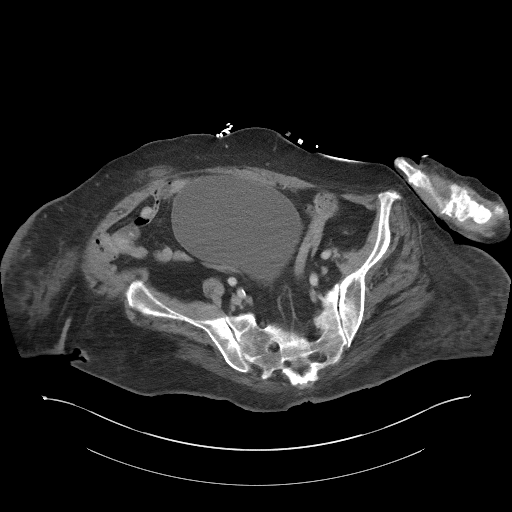
[im 44/97  soft-tissue]
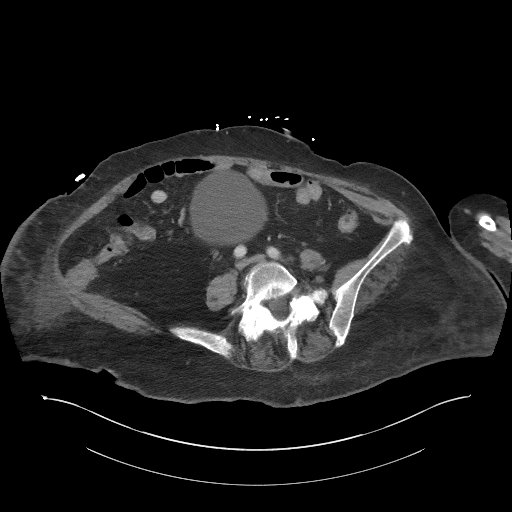
[im 53/97  soft-tissue]
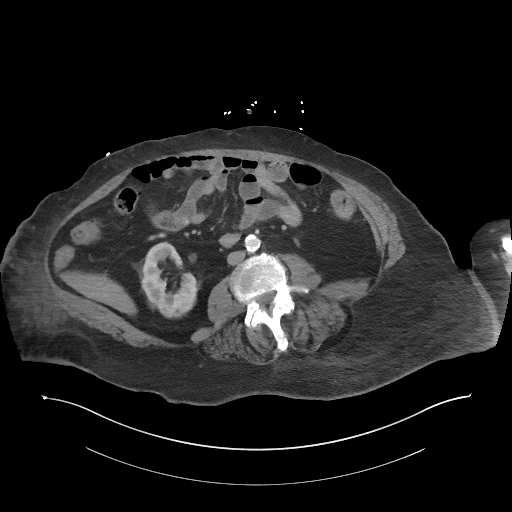
[im 62/97  soft-tissue]
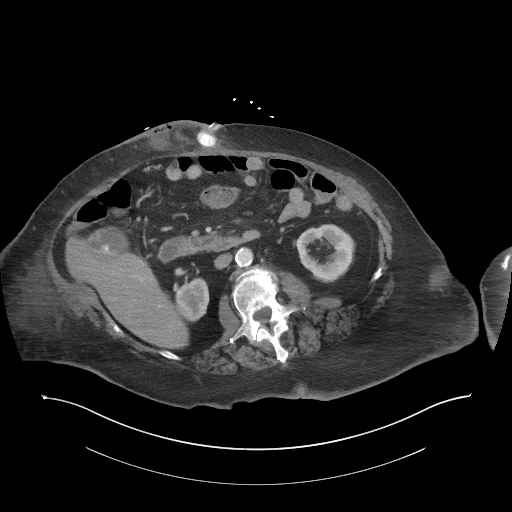
[im 70/97  soft-tissue]
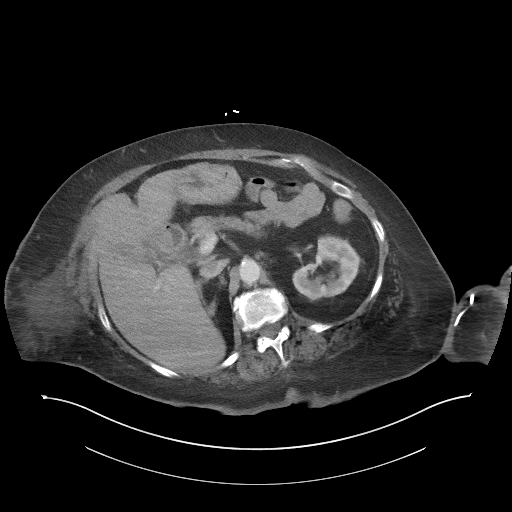
[im 79/97  soft-tissue]
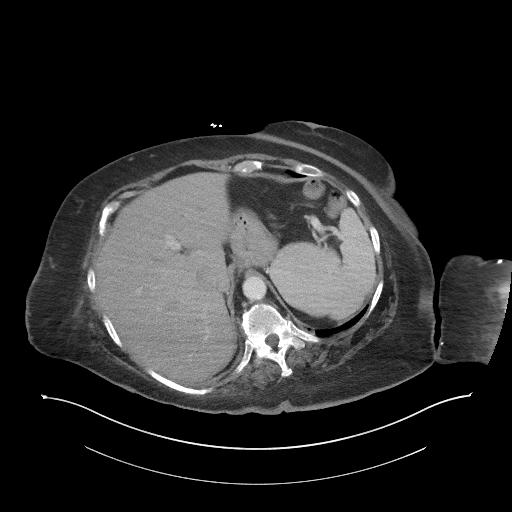
[im 79/97  bone]
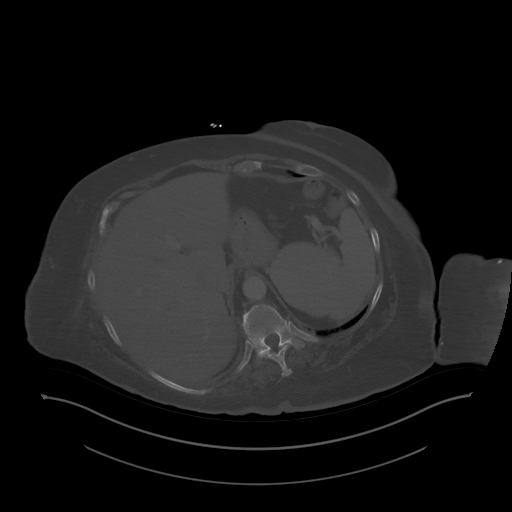
[im 88/97  soft-tissue]
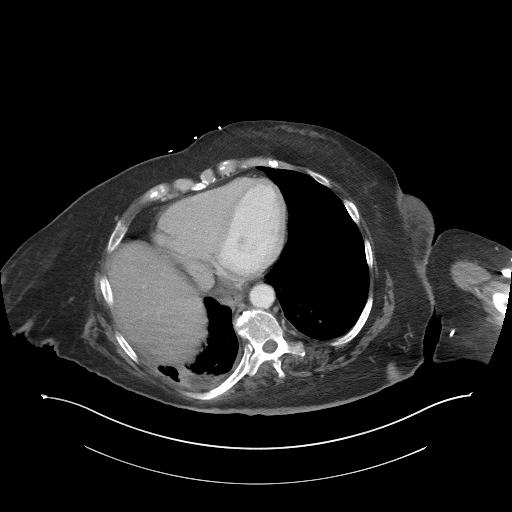

[Series 603: coronal large fov · coronal · 1.27mm/px · 3 of 155 slices shown]
[im 31/155  soft-tissue]
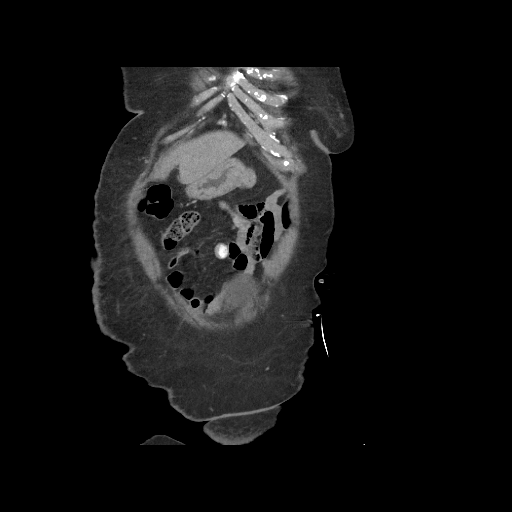
[im 62/155  soft-tissue]
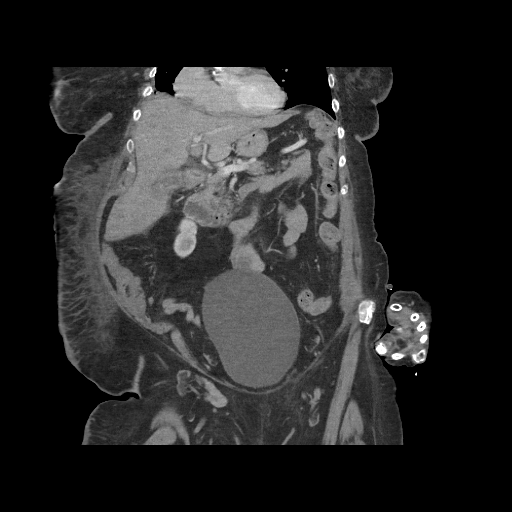
[im 93/155  soft-tissue]
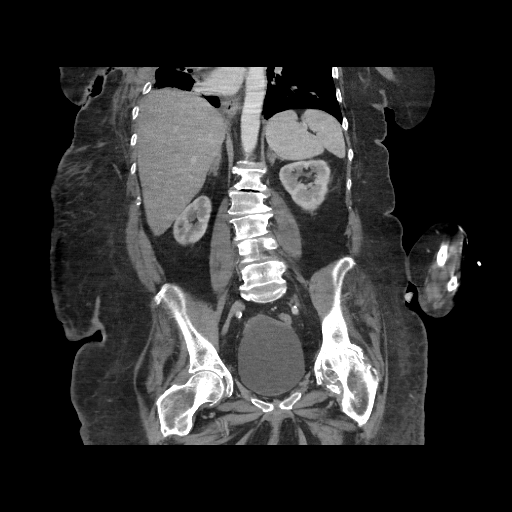

[13 of 46 positions shown; findings below may reference images not displayed]

FINDINGS: Lower chest: Trace complex right basilar pleural fluid is noted,
with associated atelectasis or scarring. Calcified granulomata are
noted at the lung bases bilaterally. Scattered coronary artery
calcifications are seen. Calcification is noted at the aortic valve.

Postoperative change is noted along the right chest wall, with
overlying soft tissue thickening and defect.

Hepatobiliary: The liver is unremarkable in appearance. Vague soft
tissue inflammation about the gallbladder could reflect mild
cholecystitis. Stones are noted within the gallbladder. The common
bile duct is distended to 1.2 cm in diameter, raising concern for
distal obstruction.

Pancreas: The pancreas is within normal limits.

Spleen: The spleen is unremarkable in appearance.

Adrenals/Urinary Tract: The adrenal glands are unremarkable in
appearance. Small bilateral renal cysts are noted. Nonspecific
perinephric stranding is noted bilaterally. There is no evidence of
hydronephrosis. No renal or ureteral stones are identified.

Stomach/Bowel: The patient's G-tube is noted embedded within the
soft tissues of the anterior abdominal wall. It will likely require
guidance for reinsertion, as the visualized tamping of the stomach
to the abdominal wall is relatively tiny on this study.

Adjacent soft tissue inflammation is seen, with trace subcutaneous
soft tissue air and skin thickening. As there is no evidence of
discoloration on clinical evaluation, this likely reflects air from
the patient's G-tube rather than infection with a gas producing
organism.

The small bowel loops are unremarkable in appearance. Vague mild
mucosal edema along the antrum of the stomach may be chronic in
nature.

The appendix is normal in caliber, without evidence of appendicitis.
The colon is grossly unremarkable in appearance. Mild mucosal edema
at the rectum is of uncertain significance.

Vascular/Lymphatic: Scattered calcification is seen along the
abdominal aorta and its branches. The abdominal aorta is otherwise
grossly unremarkable. The inferior vena cava is grossly
unremarkable. No retroperitoneal lymphadenopathy is seen. No pelvic
sidewall lymphadenopathy is identified.

Reproductive: The bladder is minimally distended and grossly
unremarkable in appearance. The uterus is grossly unremarkable. The
ovaries are relatively symmetric. No suspicious adnexal masses are
seen.

Other: Mild soft tissue edema is noted along the lateral abdominal
wall bilaterally.

Musculoskeletal: No acute osseous abnormalities are identified. Mild
facet disease is noted along the lumbar spine. The visualized
musculature is unremarkable in appearance.
IMPRESSION: 1. G-tube noted embedded within the soft tissues of the anterior
abdominal wall. It will likely require guidance for reinsertion, as
the visualized tamping of the stomach to the abdominal wall is
relatively tiny on this study.
2. Adjacent soft tissue inflammation to the right of the G-tube,
with trace subcutaneous soft tissue air and skin thickening. As
there is no evidence of discoloration on clinical evaluation, this
likely reflects air from the patient's G-tube rather than infection
with a gas producing organism.
3. Vague soft tissue inflammation about the gallbladder could
reflect mild cholecystitis. Cholelithiasis noted. Dilatation of the
common bile duct to 1.2 cm in diameter, raising concern for distal
obstruction. MRCP or ERCP could be considered for further
evaluation, as deemed clinically appropriate.
4. Trace complex right basilar pleural fluid, with associated
atelectasis or scarring.
5. Vague mild mucosal edema along the antrum of the stomach may be
chronic in nature.
6. Mild mucosal edema at the rectum, of uncertain significance.
7. Scattered coronary artery calcifications seen. Calcification at
the aortic valve.
8. Postoperative change along the right chest wall, with overlying
soft tissue thickening and defect. Would correlate with the
patient's surgical history.
9. Small bilateral renal cysts noted.
10. Scattered aortic atherosclerosis.

These results were called by telephone at the time of interpretation
on 11/20/2016 at [DATE] to Dr. BERLINE CHAO, who verbally
acknowledged these results.

## 2018-08-09 ENCOUNTER — Other Ambulatory Visit: Payer: Self-pay

## 2018-08-09 ENCOUNTER — Inpatient Hospital Stay
Admission: EM | Admit: 2018-08-09 | Discharge: 2018-08-12 | DRG: 871 | Disposition: A | Discharge status: Skilled Nursing Facility | Payer: Medicare Other | Source: Skilled Nursing Facility | Attending: Internal Medicine | Admitting: Internal Medicine

## 2018-08-09 ENCOUNTER — Emergency Department: Payer: Medicare Other

## 2018-08-09 ENCOUNTER — Encounter: Payer: Self-pay | Admitting: Emergency Medicine

## 2018-08-09 DIAGNOSIS — Z7901 Long term (current) use of anticoagulants: Secondary | ICD-10-CM

## 2018-08-09 DIAGNOSIS — Z87891 Personal history of nicotine dependence: Secondary | ICD-10-CM

## 2018-08-09 DIAGNOSIS — Z7401 Bed confinement status: Secondary | ICD-10-CM

## 2018-08-09 DIAGNOSIS — J440 Chronic obstructive pulmonary disease with acute lower respiratory infection: Secondary | ICD-10-CM | POA: Diagnosis present

## 2018-08-09 DIAGNOSIS — I480 Paroxysmal atrial fibrillation: Secondary | ICD-10-CM | POA: Diagnosis present

## 2018-08-09 DIAGNOSIS — A419 Sepsis, unspecified organism: Secondary | ICD-10-CM | POA: Diagnosis not present

## 2018-08-09 DIAGNOSIS — R17 Unspecified jaundice: Secondary | ICD-10-CM | POA: Diagnosis not present

## 2018-08-09 DIAGNOSIS — R791 Abnormal coagulation profile: Secondary | ICD-10-CM | POA: Diagnosis present

## 2018-08-09 DIAGNOSIS — J9602 Acute respiratory failure with hypercapnia: Secondary | ICD-10-CM

## 2018-08-09 DIAGNOSIS — J9600 Acute respiratory failure, unspecified whether with hypoxia or hypercapnia: Secondary | ICD-10-CM | POA: Diagnosis present

## 2018-08-09 DIAGNOSIS — I10 Essential (primary) hypertension: Secondary | ICD-10-CM | POA: Diagnosis present

## 2018-08-09 DIAGNOSIS — K8051 Calculus of bile duct without cholangitis or cholecystitis with obstruction: Secondary | ICD-10-CM

## 2018-08-09 DIAGNOSIS — J9601 Acute respiratory failure with hypoxia: Secondary | ICD-10-CM

## 2018-08-09 DIAGNOSIS — Z23 Encounter for immunization: Secondary | ICD-10-CM

## 2018-08-09 DIAGNOSIS — Z8249 Family history of ischemic heart disease and other diseases of the circulatory system: Secondary | ICD-10-CM

## 2018-08-09 DIAGNOSIS — Z9981 Dependence on supplemental oxygen: Secondary | ICD-10-CM

## 2018-08-09 DIAGNOSIS — J189 Pneumonia, unspecified organism: Secondary | ICD-10-CM | POA: Diagnosis present

## 2018-08-09 DIAGNOSIS — Z6834 Body mass index (BMI) 34.0-34.9, adult: Secondary | ICD-10-CM

## 2018-08-09 DIAGNOSIS — J9622 Acute and chronic respiratory failure with hypercapnia: Secondary | ICD-10-CM | POA: Diagnosis present

## 2018-08-09 DIAGNOSIS — L8990 Pressure ulcer of unspecified site, unspecified stage: Secondary | ICD-10-CM | POA: Diagnosis present

## 2018-08-09 DIAGNOSIS — J9621 Acute and chronic respiratory failure with hypoxia: Secondary | ICD-10-CM | POA: Diagnosis present

## 2018-08-09 DIAGNOSIS — Z66 Do not resuscitate: Secondary | ICD-10-CM | POA: Diagnosis not present

## 2018-08-09 LAB — COMPREHENSIVE METABOLIC PANEL
ALBUMIN: 3 g/dL — AB (ref 3.5–5.0)
ALK PHOS: 360 U/L — AB (ref 38–126)
ALT: 94 U/L — ABNORMAL HIGH (ref 0–44)
AST: 72 U/L — AB (ref 15–41)
Anion gap: 11 (ref 5–15)
BILIRUBIN TOTAL: 4.8 mg/dL — AB (ref 0.3–1.2)
BUN: 19 mg/dL (ref 8–23)
CALCIUM: 8.3 mg/dL — AB (ref 8.9–10.3)
CO2: 33 mmol/L — ABNORMAL HIGH (ref 22–32)
Chloride: 90 mmol/L — ABNORMAL LOW (ref 98–111)
Creatinine, Ser: 0.99 mg/dL (ref 0.44–1.00)
GFR calc Af Amer: >60 mL/min (ref >60)
GFR, EST NON AFRICAN AMERICAN: 54 mL/min — AB (ref >60)
GLUCOSE: 154 mg/dL — AB (ref 70–99)
Potassium: 3.4 mmol/L — ABNORMAL LOW (ref 3.5–5.1)
Sodium: 134 mmol/L — ABNORMAL LOW (ref 135–145)
TOTAL PROTEIN: 7.1 g/dL (ref 6.5–8.1)

## 2018-08-09 LAB — CBC WITH DIFFERENTIAL/PLATELET
ABS IMMATURE GRANULOCYTES: 0.49 10*3/uL — AB (ref 0.00–0.07)
BASOS ABS: 0.1 10*3/uL (ref 0.0–0.1)
Basophils Relative: 0 %
EOS PCT: 1 %
Eosinophils Absolute: 0.2 10*3/uL (ref 0.0–0.5)
HCT: 40.4 % (ref 36.0–46.0)
HEMOGLOBIN: 12.2 g/dL (ref 12.0–15.0)
IMMATURE GRANULOCYTES: 3 %
LYMPHS ABS: 0.8 10*3/uL (ref 0.7–4.0)
LYMPHS PCT: 5 %
MCH: 27 pg (ref 26.0–34.0)
MCHC: 30.2 g/dL (ref 30.0–36.0)
MCV: 89.4 fL (ref 80.0–100.0)
MONO ABS: 0.6 10*3/uL (ref 0.1–1.0)
MONOS PCT: 4 %
Neutro Abs: 14.1 10*3/uL — ABNORMAL HIGH (ref 1.7–7.7)
Neutrophils Relative %: 87 %
Platelets: 238 10*3/uL (ref 150–400)
RBC: 4.52 MIL/uL (ref 3.87–5.11)
RDW: 17.7 % — ABNORMAL HIGH (ref 11.5–15.5)
WBC: 16.3 10*3/uL — ABNORMAL HIGH (ref 4.0–10.5)
nRBC: 0 % (ref 0.0–0.2)

## 2018-08-09 LAB — BLOOD GAS, VENOUS
Acid-Base Excess: 5.3 mmol/L — ABNORMAL HIGH (ref 0.0–2.0)
Bicarbonate: 32.9 mmol/L — ABNORMAL HIGH (ref 20.0–28.0)
O2 SAT: 87.9 %
PCO2 VEN: 61 mmHg — AB (ref 44.0–60.0)
PH VEN: 7.34 (ref 7.250–7.430)
Patient temperature: 37
pO2, Ven: 58 mmHg — ABNORMAL HIGH (ref 32.0–45.0)

## 2018-08-09 LAB — PROTIME-INR
INR: 6.08 — AB
PROTHROMBIN TIME: 53.1 s — AB (ref 11.4–15.2)

## 2018-08-09 LAB — LACTIC ACID, PLASMA
Lactic Acid, Venous: 1.1 mmol/L (ref 0.5–1.9)
Lactic Acid, Venous: 0.8 mmol/L (ref 0.5–1.9)

## 2018-08-09 LAB — AMMONIA: AMMONIA: 25 umol/L (ref 9–35)

## 2018-08-09 LAB — LIPASE, BLOOD: LIPASE: 120 U/L — AB (ref 11–51)

## 2018-08-09 MED ORDER — IPRATROPIUM-ALBUTEROL 0.5-2.5 (3) MG/3ML IN SOLN
3.0000 mL | Freq: Once | RESPIRATORY_TRACT | Status: AC
  Administered 2018-08-09: 3 mL via RESPIRATORY_TRACT
  Filled 2018-08-09: qty 3

## 2018-08-09 MED ORDER — VANCOMYCIN HCL IN DEXTROSE 1-5 GM/200ML-% IV SOLN
1000.0000 mg | Freq: Once | INTRAVENOUS | Status: AC
  Administered 2018-08-09: 1000 mg via INTRAVENOUS
  Filled 2018-08-09: qty 200

## 2018-08-09 MED ORDER — CEFEPIME HCL 2 G IJ SOLR
2.0000 g | Freq: Once | INTRAMUSCULAR | Status: AC
  Administered 2018-08-09: 2 g via INTRAVENOUS
  Filled 2018-08-09: qty 2

## 2018-08-09 NOTE — ED Notes (Signed)
Patient transported to X-ray 

## 2018-08-09 NOTE — ED Notes (Signed)
Bedside US complete

## 2018-08-09 NOTE — ED Notes (Signed)
Multiple staff at bedside to attempt in and out cath for urine sample. Pt is bed bound and incontinent. Unable to obtain urine at this time.

## 2018-08-09 NOTE — ED Notes (Signed)
Family at bedside with MD

## 2018-08-09 NOTE — ED Notes (Signed)
Pt had bowel movement. Changed and repositioned in bed.

## 2018-08-09 NOTE — ED Notes (Signed)
ED Provider at bedside. 

## 2018-08-09 NOTE — ED Provider Notes (Signed)
[facility] Emergency Department Provider Note    First MD Initiated Contact with Patient 08/09/18 1911     (approximate)  I have reviewed the triage vital signs and the nursing notes.   HISTORY  Chief Complaint Shortness of Breath  Level V Caveat:  Acute resp distress  HPI Mikayla [name] is a 77 y.o. female presents via EMS due to respiratory failure.  Patient arrives to the emergency traffic due to fever and concern for sepsis requiring supplemental oxygenation in addition to her baseline 1 to 2 L.  Patient was found to be hypoxic to the high 70s.  Was given nebulizer treatments as well as steroids.  Patient with multiple chronic medical conditions.  Arrives in mild respiratory distress but did have some significant improvement after treatment started by EMS.  States that she has had some nausea but denies any chest pain was recently started on antibiotics for presumed pneumonia.    Past Medical History:  Diagnosis Date  . Atrial fibrillation (HCC)   . Empyema [facility])    in [facility] Aug 2017- stayed in hospital on for 2 months.  . Hypertension    Family History  Problem Relation Age of Onset  . Hypertension Mother    Past Surgical History:  Procedure Laterality Date  . PEG TUBE PLACEMENT     Patient Active Problem List   Diagnosis Date Noted  . Cellulitis 11/20/2016  . Sepsis (HCC) 10/23/2016  . Pressure injury of skin 09/30/2016  . Palliative care encounter   . Goals of care, counseling/discussion   . Encounter for hospice care discussion   . Septic shock (HCC) 09/28/2016  . Diarrhea 09/28/2016      Prior to Admission medications   Medication Sig Start Date End Date Taking? Authorizing Provider  acetaminophen (TYLENOL) 500 MG tablet Take 1,000 mg by mouth every 8 (eight) hours as needed for moderate pain or fever.    [provider]  amiodarone (PACERONE) 200 MG tablet Take 1 tablet (200 mg total) by mouth daily. 11/22/16   Shaune Pollack, MD  amoxicillin-clavulanate (AUGMENTIN) 875-125 MG tablet Take 1 tablet by mouth every 12 (twelve) hours. 11/21/16   Shaune Pollack, MD  carbidopa-levodopa (SINEMET IR) 10-100 MG tablet Take 1 tablet by mouth 3 (three) times daily.    [provider]  carvedilol (COREG) 6.25 MG tablet Take 1 tablet (6.25 mg total) by mouth 2 (two) times daily with a meal. 11/21/16   Shaune Pollack, MD  celecoxib (CELEBREX) 100 MG capsule Take 100 mg by mouth 2 (two) times daily.    [provider]  furosemide (LASIX) 20 MG tablet Take 1 tablet (20 mg total) by mouth daily. 10/30/16   Milagros Loll, MD  gabapentin (NEURONTIN) 300 MG capsule Take 300 mg by mouth 3 (three) times daily.    [provider]  ipratropium-albuterol (DUONEB) 0.5-2.5 (3) MG/3ML SOLN Take 3 mLs by nebulization 3 (three) times daily.    [provider]  Melatonin 5 MG TABS Take 5 mg by mouth at bedtime.    [provider]  Multiple Vitamin (MULTIVITAMIN WITH MINERALS) TABS tablet Take 1 tablet by mouth daily.    [provider]  omeprazole (PRILOSEC OTC) 20 MG tablet Take 20 mg by mouth 2 (two) times daily.    [provider]  PARoxetine (PAXIL) 30 MG tablet Take 30 mg by mouth daily.     [provider]  potassium chloride SA (K-DUR,KLOR-CON) 20 MEQ tablet  Take 20 mEq by mouth daily.    [provider]  sodium chloride (OCEAN) 0.65 % SOLN nasal spray Place 1 spray into both nostrils every 2 (two) hours as needed for congestion (allergies).    [provider]  traZODone (DESYREL) 50 MG tablet Take 25 mg by mouth at bedtime.    [provider]  warfarin (COUMADIN) 4 MG tablet Take 1 tablet (4 mg total) by mouth daily. 11/21/16   Shaune Pollack, MD    Allergies Other; Sulfa antibiotics; and Oxycodone    Social History Social History   Tobacco Use  . Smoking status: Former Smoker    Types: Cigarettes  . Smokeless tobacco: Former Neurosurgeon  . Tobacco  comment: quit in 2001. smoked for 30 years  Substance Use Topics  . Alcohol use: No  . Drug use: No    Review of Systems Patient denies headaches, rhinorrhea, blurry vision, numbness, shortness of breath, chest pain, edema, cough, abdominal pain, nausea, vomiting, diarrhea, dysuria, fevers, rashes or hallucinations unless otherwise stated above in HPI. ____________________________________________   PHYSICAL EXAM:  VITAL SIGNS: Vitals:   08/09/18 2130 08/09/18 2200  BP: (!) 148/80 (!) 141/71  Pulse: 78 72  Resp: 20 19  SpO2: 97% 94%    Constitutional: Alert, chronically ill-appearing but in acute respiratory distress requiring supplemental oxygen. Eyes: Conjunctivae are normal.  Head: Atraumatic. Nose: No congestion/rhinnorhea. Mouth/Throat: Mucous membranes are moist.   Neck: No stridor. Painless ROM.  Cardiovascular: Normal rate, regular rhythm. Grossly normal heart sounds.  Good peripheral circulation. Respiratory: Normal respiratory effort.  No retractions. Lungs CTAB. Gastrointestinal: Soft but morbidly obese, nontender. No distention. No abdominal bruits. No CVA tenderness. Genitourinary: deferred Musculoskeletal: No lower extremity tenderness nor edema.  No joint effusions. Neurologic:  Normal speech and language. No gross focal neurologic deficits are appreciated. No facial droop Skin:  Skin is warm, dry and intact. No rash noted. Psychiatric: Mood and affect are normal. Speech and behavior are normal.  ____________________________________________   LABS (all labs ordered are listed, but only abnormal results are displayed)  Results for orders placed or performed during the hospital encounter of 08/09/18 (from the past 24 hour(s))  Comprehensive metabolic panel     Status: Abnormal   Collection Time: 08/09/18  7:25 PM  Result Value Ref Range   Sodium 134 (L) 135 - 145 mmol/L   Potassium 3.4 (L) 3.5 - 5.1 mmol/L   Chloride 90 (L) 98 - 111 mmol/L   CO2 33 (H) 22  - 32 mmol/L   Glucose, Bld 154 (H) 70 - 99 mg/dL   BUN 19 8 - 23 mg/dL   Creatinine, Ser 1.61 0.44 - 1.00 mg/dL   Calcium 8.3 (L) 8.9 - 10.3 mg/dL   Total Protein 7.1 6.5 - 8.1 g/dL   Albumin 3.0 (L) 3.5 - 5.0 g/dL   AST 72 (H) 15 - 41 U/L   ALT 94 (H) 0 - 44 U/L   Alkaline Phosphatase 360 (H) 38 - 126 U/L   Total Bilirubin 4.8 (H) 0.3 - 1.2 mg/dL   GFR calc non Af Amer 54 (L) >60 mL/min   GFR calc Af Amer >60 >60 mL/min   Anion gap 11 5 - 15  Lactic acid, plasma     Status: None   Collection Time: 08/09/18  7:25 PM  Result Value Ref Range   Lactic Acid, Venous 1.1 0.5 - 1.9 mmol/L  CBC with Differential     Status: Abnormal   Collection Time:  08/09/18  7:25 PM  Result Value Ref Range   WBC 16.3 (H) 4.0 - 10.5 K/uL   RBC 4.52 3.87 - 5.11 MIL/uL   Hemoglobin 12.2 12.0 - 15.0 g/dL   HCT 16.1 09.6 - 04.5 %   MCV 89.4 80.0 - 100.0 fL   MCH 27.0 26.0 - 34.0 pg   MCHC 30.2 30.0 - 36.0 g/dL   RDW 40.9 (H) 81.1 - 91.4 %   Platelets 238 150 - 400 K/uL   nRBC 0.0 0.0 - 0.2 %   Neutrophils Relative % 87 %   Neutro Abs 14.1 (H) 1.7 - 7.7 K/uL   Lymphocytes Relative 5 %   Lymphs Abs 0.8 0.7 - 4.0 K/uL   Monocytes Relative 4 %   Monocytes Absolute 0.6 0.1 - 1.0 K/uL   Eosinophils Relative 1 %   Eosinophils Absolute 0.2 0.0 - 0.5 K/uL   Basophils Relative 0 %   Basophils Absolute 0.1 0.0 - 0.1 K/uL   Immature Granulocytes 3 %   Abs Immature Granulocytes 0.49 (H) 0.00 - 0.07 K/uL  Protime-INR     Status: Abnormal   Collection Time: 08/09/18  7:25 PM  Result Value Ref Range   Prothrombin Time 53.1 (H) 11.4 - 15.2 seconds   INR 6.08 (HH)   Lipase, blood     Status: Abnormal   Collection Time: 08/09/18  7:25 PM  Result Value Ref Range   Lipase 120 (H) 11 - 51 U/L  Blood gas, venous     Status: Abnormal   Collection Time: 08/09/18  9:12 PM  Result Value Ref Range   pH, Ven 7.34 7.250 - 7.430   pCO2, Ven 61 (H) 44.0 - 60.0 mmHg   pO2, Ven 58.0 (H) 32.0 - 45.0 mmHg   Bicarbonate  32.9 (H) 20.0 - 28.0 mmol/L   Acid-Base Excess 5.3 (H) 0.0 - 2.0 mmol/L   O2 Saturation 87.9 %   Patient temperature 37.0    Sample type VENOUS   Lactic acid, plasma     Status: None   Collection Time: 08/09/18  9:21 PM  Result Value Ref Range   Lactic Acid, Venous 0.8 0.5 - 1.9 mmol/L  Ammonia     Status: None   Collection Time: 08/09/18  9:30 PM  Result Value Ref Range   Ammonia 25 9 - 35 umol/L   ____________________________________________  EKG My review and personal interpretation at Time: 19:15   Indication: resp distr  Rate: 90  Rhythm: sinus Axis: normeal Other: inferolateral st abn, no stemi ____________________________________________  RADIOLOGY  I personally reviewed all radiographic images ordered to evaluate for the above acute complaints and reviewed radiology reports and findings.  These findings were personally discussed with the patient.  Please see medical record for radiology report.  ____________________________________________   PROCEDURES  Procedure(s) performed:  .Critical Care Performed by: Willy Eddy, MD Authorized by: Willy Eddy, MD   Critical care provider statement:    Critical care time (minutes):  48   Critical care time was exclusive of:  Separately billable procedures and treating other patients   Critical care was necessary to treat or prevent imminent or life-threatening deterioration of the following conditions:  Respiratory failure and metabolic crisis   Critical care was time spent personally by me on the following activities:  Development of treatment plan with patient or surrogate, discussions with consultants, evaluation of patient's response to treatment, examination of patient, obtaining history from patient or surrogate, ordering and performing treatments and interventions,  ordering and review of laboratory studies, ordering and review of radiographic studies, pulse oximetry, re-evaluation of patient's condition and  review of old charts      Critical Care performed: yes ____________________________________________   INITIAL IMPRESSION / ASSESSMENT AND PLAN / ED COURSE  Pertinent labs & imaging results that were available during my care of the patient were reviewed by me and considered in my medical decision making (see chart for details).   DDX: pna, copd exacerbation, dehydration uti, ptx, edema, sepsis, cholelithiasis, choledocholithiasis, pancreatitis  EMMOGENE SIMSON is a 77 y.o. who presents to the ED with symptoms as described above.  She arrives febrile mildly tachycardic but fortunately normotensive with mild respiratory distress as described above requiring multiple nebulizer treatments as well as supplemental oxygen.  Family reports that she is DNR.  The patient will be placed on continuous pulse oximetry and telemetry for monitoring.  Laboratory evaluation will be sent to evaluate for the above complaints.     Clinical Course as of Aug 09 2249  Tue Aug 09, 2018  2024 Will check VBG.  Patient also noted to have significantly elevated bilirubin.  Will order ultrasound to evaluate for obstructive pattern.   [PR]  2202 Lipase also elevated.  Presentation concerning for choledocholithiasis probable gallstone pancreatitis though lipase only minimally elevated at this time.  Patient receiving IV antibiotics for sepsis given fever and worsening hypoxia.  Being treated for COPD exacerbation is Artie received steroids.   [PR]  2224 Discussed case with Dr. Maximino Greenland of GI states that we do have ERCP capabilities at the facility.  Patient will be admitted to this facility.  We will continue nebulizers for COPD exacerbation acute on chronic respiratory failure with hypoxia and hypercapnia.  Will consult code sepsis his lactate is normal and seems more consistent with COPD exacerbation.   [PR]  2225 Have discussed with the patient and available family all diagnostics and treatments performed thus far  and all questions were answered to the best of my ability. The patient demonstrates understanding and agreement with plan.    [PR]    Clinical Course User Index [PR] Willy Eddy, MD     As part of my medical decision making, I reviewed the following data within the electronic MEDICAL RECORD NUMBER Nursing notes reviewed and incorporated, Labs reviewed, notes from prior ED visits and [place] Controlled Substance Database   ____________________________________________   FINAL CLINICAL IMPRESSION(S) / ED DIAGNOSES  Final diagnoses:  Elevated bilirubin  Acute respiratory failure with hypoxia and hypercapnia (HCC)  Calculus of bile duct without cholecystitis with obstruction      NEW MEDICATIONS STARTED DURING THIS VISIT:  New Prescriptions   No medications on file     Note:  This document was prepared using Dragon voice recognition software and may include unintentional dictation errors.    Willy Eddy, MD 08/09/18 2250

## 2018-08-09 NOTE — ED Triage Notes (Signed)
Pt presents to ed from [facility] via [facility] with c/o respiratory distress according to facility. Tx for pneumonia for past 2 weeks. Wheezes on aus. 2 duonebs given in route. 145 of  solumedrol given. 102.9 axillary temp. 2 L chronically, hx of copd. 20 G in right hand. 250 ml normal saline given by EMS. DNR on file.

## 2018-08-10 ENCOUNTER — Other Ambulatory Visit: Payer: Self-pay

## 2018-08-10 DIAGNOSIS — Z23 Encounter for immunization: Secondary | ICD-10-CM | POA: Diagnosis present

## 2018-08-10 DIAGNOSIS — Z66 Do not resuscitate: Secondary | ICD-10-CM | POA: Diagnosis not present

## 2018-08-10 DIAGNOSIS — R17 Unspecified jaundice: Secondary | ICD-10-CM | POA: Diagnosis present

## 2018-08-10 DIAGNOSIS — K8051 Calculus of bile duct without cholangitis or cholecystitis with obstruction: Secondary | ICD-10-CM

## 2018-08-10 DIAGNOSIS — Z7401 Bed confinement status: Secondary | ICD-10-CM | POA: Diagnosis not present

## 2018-08-10 DIAGNOSIS — J9622 Acute and chronic respiratory failure with hypercapnia: Secondary | ICD-10-CM | POA: Diagnosis present

## 2018-08-10 DIAGNOSIS — Z8249 Family history of ischemic heart disease and other diseases of the circulatory system: Secondary | ICD-10-CM | POA: Diagnosis not present

## 2018-08-10 DIAGNOSIS — J189 Pneumonia, unspecified organism: Secondary | ICD-10-CM | POA: Diagnosis present

## 2018-08-10 DIAGNOSIS — Z9981 Dependence on supplemental oxygen: Secondary | ICD-10-CM | POA: Diagnosis not present

## 2018-08-10 DIAGNOSIS — I480 Paroxysmal atrial fibrillation: Secondary | ICD-10-CM | POA: Diagnosis present

## 2018-08-10 DIAGNOSIS — R791 Abnormal coagulation profile: Secondary | ICD-10-CM | POA: Diagnosis present

## 2018-08-10 DIAGNOSIS — J9600 Acute respiratory failure, unspecified whether with hypoxia or hypercapnia: Secondary | ICD-10-CM | POA: Diagnosis present

## 2018-08-10 DIAGNOSIS — I10 Essential (primary) hypertension: Secondary | ICD-10-CM | POA: Diagnosis present

## 2018-08-10 DIAGNOSIS — J440 Chronic obstructive pulmonary disease with acute lower respiratory infection: Secondary | ICD-10-CM | POA: Diagnosis present

## 2018-08-10 DIAGNOSIS — Z6834 Body mass index (BMI) 34.0-34.9, adult: Secondary | ICD-10-CM | POA: Diagnosis not present

## 2018-08-10 DIAGNOSIS — A419 Sepsis, unspecified organism: Secondary | ICD-10-CM | POA: Diagnosis present

## 2018-08-10 DIAGNOSIS — Z87891 Personal history of nicotine dependence: Secondary | ICD-10-CM | POA: Diagnosis not present

## 2018-08-10 DIAGNOSIS — J9621 Acute and chronic respiratory failure with hypoxia: Secondary | ICD-10-CM | POA: Diagnosis present

## 2018-08-10 DIAGNOSIS — Z7901 Long term (current) use of anticoagulants: Secondary | ICD-10-CM | POA: Diagnosis not present

## 2018-08-10 LAB — CBC
HCT: 39.5 % (ref 36.0–46.0)
HEMOGLOBIN: 11.9 g/dL — AB (ref 12.0–15.0)
MCH: 26.9 pg (ref 26.0–34.0)
MCHC: 30.1 g/dL (ref 30.0–36.0)
MCV: 89.2 fL (ref 80.0–100.0)
Platelets: 216 10*3/uL (ref 150–400)
RBC: 4.43 MIL/uL (ref 3.87–5.11)
RDW: 17.7 % — AB (ref 11.5–15.5)
WBC: 16.6 10*3/uL — AB (ref 4.0–10.5)
nRBC: 0 % (ref 0.0–0.2)

## 2018-08-10 LAB — HEPATIC FUNCTION PANEL
ALT: 114 U/L — AB (ref 0–44)
AST: 94 U/L — AB (ref 15–41)
Albumin: 2.9 g/dL — ABNORMAL LOW (ref 3.5–5.0)
Alkaline Phosphatase: 370 U/L — ABNORMAL HIGH (ref 38–126)
BILIRUBIN DIRECT: 4.1 mg/dL — AB (ref 0.0–0.2)
BILIRUBIN INDIRECT: 1.5 mg/dL — AB (ref 0.3–0.9)
BILIRUBIN TOTAL: 5.6 mg/dL — AB (ref 0.3–1.2)
Total Protein: 7 g/dL (ref 6.5–8.1)

## 2018-08-10 LAB — BASIC METABOLIC PANEL
ANION GAP: 10 (ref 5–15)
BUN: 18 mg/dL (ref 8–23)
CALCIUM: 8.5 mg/dL — AB (ref 8.9–10.3)
CHLORIDE: 92 mmol/L — AB (ref 98–111)
CO2: 33 mmol/L — AB (ref 22–32)
CREATININE: 0.87 mg/dL (ref 0.44–1.00)
Glucose, Bld: 199 mg/dL — ABNORMAL HIGH (ref 70–99)
Potassium: 3.4 mmol/L — ABNORMAL LOW (ref 3.5–5.1)
Sodium: 135 mmol/L (ref 135–145)

## 2018-08-10 LAB — GLUCOSE, CAPILLARY: GLUCOSE-CAPILLARY: 193 mg/dL — AB (ref 70–99)

## 2018-08-10 LAB — MRSA PCR SCREENING: MRSA by PCR: NEGATIVE

## 2018-08-10 LAB — PROTIME-INR
INR: 5.83
Prothrombin Time: 51.4 seconds — ABNORMAL HIGH (ref 11.4–15.2)

## 2018-08-10 MED ORDER — POTASSIUM CHLORIDE CRYS ER 20 MEQ PO TBCR
20.0000 meq | EXTENDED_RELEASE_TABLET | Freq: Every day | ORAL | Status: DC
  Administered 2018-08-10 – 2018-08-12 (×2): 20 meq via ORAL
  Filled 2018-08-10 (×2): qty 1

## 2018-08-10 MED ORDER — ACETAMINOPHEN 650 MG RE SUPP: 650.0000 mg | Freq: Four times a day (QID) | RECTAL | Status: DC | PRN

## 2018-08-10 MED ORDER — IPRATROPIUM-ALBUTEROL 0.5-2.5 (3) MG/3ML IN SOLN
3.0000 mL | Freq: Four times a day (QID) | RESPIRATORY_TRACT | Status: DC
  Administered 2018-08-10 (×4): 3 mL via RESPIRATORY_TRACT
  Filled 2018-08-10 (×4): qty 3

## 2018-08-10 MED ORDER — CARBIDOPA-LEVODOPA 10-100 MG PO TABS
1.0000 | ORAL_TABLET | Freq: Three times a day (TID) | ORAL | Status: DC
  Administered 2018-08-10 – 2018-08-12 (×6): 1 via ORAL
  Filled 2018-08-10 (×9): qty 1

## 2018-08-10 MED ORDER — TRAZODONE HCL 50 MG PO TABS: 25.0000 mg | ORAL_TABLET | Freq: Every evening | ORAL | Status: DC | PRN

## 2018-08-10 MED ORDER — ACETAMINOPHEN 325 MG PO TABS
650.0000 mg | ORAL_TABLET | Freq: Four times a day (QID) | ORAL | Status: DC | PRN
  Administered 2018-08-10: 650 mg via ORAL
  Filled 2018-08-10: qty 2

## 2018-08-10 MED ORDER — VANCOMYCIN HCL IN DEXTROSE 1-5 GM/200ML-% IV SOLN
1000.0000 mg | INTRAVENOUS | Status: DC
  Administered 2018-08-10: 06:00:00 1000 mg via INTRAVENOUS
  Filled 2018-08-10 (×2): qty 200

## 2018-08-10 MED ORDER — OMEPRAZOLE MAGNESIUM 20 MG PO TBEC: 20.0000 mg | DELAYED_RELEASE_TABLET | Freq: Two times a day (BID) | ORAL | Status: DC

## 2018-08-10 MED ORDER — PANTOPRAZOLE SODIUM 40 MG PO TBEC
40.0000 mg | DELAYED_RELEASE_TABLET | Freq: Two times a day (BID) | ORAL | Status: DC
  Administered 2018-08-10 – 2018-08-12 (×4): 40 mg via ORAL
  Filled 2018-08-10 (×4): qty 1

## 2018-08-10 MED ORDER — HYDROCODONE-ACETAMINOPHEN 5-325 MG PO TABS: 1.0000 | ORAL_TABLET | ORAL | Status: DC | PRN

## 2018-08-10 MED ORDER — SODIUM CHLORIDE 0.9 % IV SOLN
INTRAVENOUS | Status: DC
  Administered 2018-08-10 – 2018-08-12 (×3): via INTRAVENOUS

## 2018-08-10 MED ORDER — CARVEDILOL 3.125 MG PO TABS
6.2500 mg | ORAL_TABLET | Freq: Two times a day (BID) | ORAL | Status: DC
  Administered 2018-08-10 – 2018-08-11 (×3): 6.25 mg via ORAL
  Filled 2018-08-10 (×3): qty 2

## 2018-08-10 MED ORDER — AMIODARONE HCL 200 MG PO TABS
200.0000 mg | ORAL_TABLET | Freq: Every day | ORAL | Status: DC
  Administered 2018-08-10 – 2018-08-12 (×2): 200 mg via ORAL
  Filled 2018-08-10 (×2): qty 1

## 2018-08-10 MED ORDER — PHYTONADIONE 5 MG PO TABS
10.0000 mg | ORAL_TABLET | Freq: Once | ORAL | Status: AC
  Administered 2018-08-10: 17:00:00 10 mg via ORAL
  Filled 2018-08-10: qty 2

## 2018-08-10 MED ORDER — FUROSEMIDE 20 MG PO TABS
20.0000 mg | ORAL_TABLET | Freq: Every day | ORAL | Status: DC
  Administered 2018-08-10 – 2018-08-12 (×2): 20 mg via ORAL
  Filled 2018-08-10 (×2): qty 1

## 2018-08-10 MED ORDER — BISACODYL 5 MG PO TBEC: 5.0000 mg | DELAYED_RELEASE_TABLET | Freq: Every day | ORAL | Status: DC | PRN

## 2018-08-10 MED ORDER — TRAZODONE HCL 50 MG PO TABS
25.0000 mg | ORAL_TABLET | Freq: Every day | ORAL | Status: DC
  Administered 2018-08-11: 22:00:00 25 mg via ORAL
  Filled 2018-08-10 (×2): qty 1

## 2018-08-10 MED ORDER — ADULT MULTIVITAMIN W/MINERALS CH
1.0000 | ORAL_TABLET | Freq: Every day | ORAL | Status: DC
  Administered 2018-08-10 – 2018-08-12 (×2): 1 via ORAL
  Filled 2018-08-10 (×2): qty 1

## 2018-08-10 MED ORDER — MELATONIN 5 MG PO TABS
5.0000 mg | ORAL_TABLET | Freq: Every day | ORAL | Status: DC
  Administered 2018-08-11: 5 mg via ORAL
  Filled 2018-08-10 (×4): qty 1

## 2018-08-10 MED ORDER — SALINE SPRAY 0.65 % NA SOLN
1.0000 | NASAL | Status: DC | PRN
  Filled 2018-08-10: qty 44

## 2018-08-10 MED ORDER — DOCUSATE SODIUM 100 MG PO CAPS
100.0000 mg | ORAL_CAPSULE | Freq: Two times a day (BID) | ORAL | Status: DC
  Administered 2018-08-10 – 2018-08-12 (×4): 100 mg via ORAL
  Filled 2018-08-10 (×4): qty 1

## 2018-08-10 MED ORDER — GABAPENTIN 300 MG PO CAPS
300.0000 mg | ORAL_CAPSULE | Freq: Three times a day (TID) | ORAL | Status: DC
  Administered 2018-08-10 – 2018-08-12 (×6): 300 mg via ORAL
  Filled 2018-08-10 (×6): qty 1

## 2018-08-10 MED ORDER — ONDANSETRON HCL 4 MG PO TABS: 4.0000 mg | ORAL_TABLET | Freq: Four times a day (QID) | ORAL | Status: DC | PRN

## 2018-08-10 MED ORDER — ONDANSETRON HCL 4 MG/2ML IJ SOLN: 4.0000 mg | Freq: Four times a day (QID) | INTRAMUSCULAR | Status: DC | PRN

## 2018-08-10 MED ORDER — PAROXETINE HCL 30 MG PO TABS
30.0000 mg | ORAL_TABLET | Freq: Every day | ORAL | Status: DC
  Administered 2018-08-10 – 2018-08-12 (×2): 30 mg via ORAL
  Filled 2018-08-10 (×3): qty 1

## 2018-08-10 MED ORDER — SODIUM CHLORIDE 0.9 % IV SOLN
2.0000 g | Freq: Two times a day (BID) | INTRAVENOUS | Status: DC
  Administered 2018-08-10 – 2018-08-12 (×5): 2 g via INTRAVENOUS
  Filled 2018-08-10 (×8): qty 2

## 2018-08-10 NOTE — Consult Note (Addendum)
SURGICAL CONSULTATION NOTE (initial) - cpt: 16109  Patient seen and examined as described below with surgical PA-C, Gillermina Phy.  Assessment/Plan: (ICD-10's: K80.51) In summary, patient is a 77 y.o. female with essentially asymptomatic incidental finding of choledocholithiasis and hyperbilirubinemia in context of presentation for recurrent respiratory distress, complicated by supratherapeutic INR attributed to Coumadin coagulopathy for chronic atrial fibrillation with history of post-operative stroke and by additional comorbidities including COPD on chronic supplemental home oxygen with history of empyema s/p complicated post-surgical/anesthesia course, morbid obesity (BMI 34), HTN, and former tobacco abuse (smoking).   - continue IV antibiotics  - ERCP as per GI (Dr. Servando Snare to see tomorrow with MRCP pending)  - indication for cholecystectomy, along with anticipated risks, were discussed at length with patient and her daughters (bedside), all of their questions were answered to their expressed satisfaction, patient and her daughters state repeatedly and consistently she is not interested in surgery under any circumstances, considering respiratory and other comorbidities (specifically stating all time since 2 month admission at [facility] for empyema has been "bonus" time)  - medical management of chronic medical comorbidities as per primary medical team  - will signoff, but please call if any questions, concerns, or changes   I have personally reviewed the patient's chart, evaluated/examined the patient, proposed the recommended management, and discussed these recommendations at length with the patient and her daughters (bedside) to their expressed satisfaction as well as with patient's RN and medical physician.  Thank you for the opportunity to participate in this patient's care.  -- Scherrie Gerlach Earlene Plater, MD, RPVI [place]: [facility] General Surgery - Partnering for exceptional  care. Office: [phone]    SURGICAL CONSULTATION NOTE (initial) - cpt: 99254  HISTORY OF PRESENT ILLNESS (HPI):  77 y.o. female presented to [facility] ED 10/15 for evaluation of acute respiratory distress. She was found to be hypoxic in the 70s at home and EMS was called. She was treated with duoNebs and steroids in route to the hospital with some improvement. Work up in the ED revealed an elevated WBC, elevated AST/ALT and total bilirubin, and [place] was concerning for choledocholithiasis. She denied any abdominal pain in the last few days or recent history that she can recall. Denied any nausea, emesis, fevers, or chills. No history of abdominal pain with meals.   Her daughter helps contribute to the history noting that she has had a downward spiral in her health since an admission at [facility] in 2017 for an empyema of the right lung. She underwent anesthesia at that time and had significant issues post-anesthesia including a possible stroke and LUE contracture. She currently resides in an assisted living facility and is bed bound.   Surgery is consulted by hospitalist physician Dr. Katheren Shams, MD in this context for evaluation and management of choledocholithiasis.   PAST MEDICAL HISTORY (PMH):  Past Medical History:  Diagnosis Date  . Atrial fibrillation (HCC)   . Empyema [facility])    in [facility] Aug 2017- stayed in hospital on for 2 months.  . Hypertension      PAST SURGICAL HISTORY (PSH):  Past Surgical History:  Procedure Laterality Date  . PEG TUBE PLACEMENT       MEDICATIONS:  Prior to Admission medications   Medication Sig Start Date End Date Taking? Authorizing Provider  acetaminophen (TYLENOL) 500 MG tablet Take 1,000 mg by mouth every 8 (eight) hours as needed for moderate pain or fever.   Yes [provider]  albuterol (PROVENTIL) (2.5 MG/3ML) 0.083% nebulizer  solution Take 2.5 mg by nebulization every 8 (eight) hours.   Yes [provider]  amiodarone (PACERONE) 200 MG  tablet Take 1 tablet (200 mg total) by mouth daily. 11/22/16  Yes Shaune Pollack, MD  carbidopa-levodopa (SINEMET IR) 10-100 MG tablet Take 1 tablet by mouth 3 (three) times daily.   Yes [provider]  carvedilol (COREG) 6.25 MG tablet Take 1 tablet (6.25 mg total) by mouth 2 (two) times daily with a meal. 11/21/16  Yes Shaune Pollack, MD  celecoxib (CELEBREX) 50 MG capsule Take 50 mg by mouth 2 (two) times daily.    Yes [provider]  ferrous sulfate 324 (65 Fe) MG TBEC Take 1 tablet by mouth daily.   Yes [provider]  furosemide (LASIX) 20 MG tablet Take 1 tablet (20 mg total) by mouth daily. Patient taking differently: Take 40 mg by mouth daily.  10/30/16  Yes Sudini, Wardell Heath, MD  gabapentin (NEURONTIN) 300 MG capsule Take 300 mg by mouth 3 (three) times daily.   Yes [provider]  Melatonin 5 MG TABS Take 5 mg by mouth at bedtime.   Yes [provider]  Multiple Vitamin (MULTIVITAMIN WITH MINERALS) TABS tablet Take 1 tablet by mouth daily.   Yes [provider]  omeprazole (PRILOSEC OTC) 20 MG tablet Take 20 mg by mouth 2 (two) times daily.   Yes [provider]  PARoxetine (PAXIL) 30 MG tablet Take 30 mg by mouth daily.    Yes [provider]  potassium chloride SA (K-DUR,KLOR-CON) 20 MEQ tablet Take 20 mEq by mouth daily.   Yes [provider]  senna (SENOKOT) 8.6 MG TABS tablet Take 1 tablet by mouth daily.   Yes [provider]  sodium chloride (OCEAN) 0.65 % SOLN nasal spray Place 1 spray into both nostrils every 2 (two) hours as needed for congestion (allergies).   Yes [provider]  warfarin (COUMADIN) 4 MG tablet Take 1 tablet (4 mg total) by mouth daily. 11/21/16  Yes Shaune Pollack, MD  amoxicillin-clavulanate (AUGMENTIN) 875-125 MG tablet Take 1 tablet by mouth every 12 (twelve) hours. Patient not taking: Reported on 08/10/2018 11/21/16   Shaune Pollack, MD  ipratropium-albuterol (DUONEB) 0.5-2.5  (3) MG/3ML SOLN Take 3 mLs by nebulization 3 (three) times daily.    [provider]  traZODone (DESYREL) 50 MG tablet Take 25 mg by mouth at bedtime.    [provider]     ALLERGIES:  Allergies  Allergen Reactions  . Levaquin [Levofloxacin]   . Other Nausea And Vomiting  . Sulfa Antibiotics Hives  . Oxycodone Anxiety and Other (See Comments)     SOCIAL HISTORY:  Social History   Socioeconomic History  . Marital status: Widowed    Spouse name: Not on file  . Number of children: Not on file  . Years of education: Not on file  . Highest education level: Not on file  Occupational History  . Not on file  Social Needs  . Financial resource strain: Not on file  . Food insecurity:    Worry: Not on file    Inability: Not on file  . Transportation needs:    Medical: Not on file    Non-medical: Not on file  Tobacco Use  . Smoking status: Former Smoker    Types: Cigarettes  . Smokeless tobacco: Former Neurosurgeon  . Tobacco comment: quit in 2001. smoked for 30 years  Substance and Sexual Activity  . Alcohol use: No  .  Drug use: No  . Sexual activity: Never  Lifestyle  . Physical activity:    Days per week: Not on file    Minutes per session: Not on file  . Stress: Not on file  Relationships  . Social connections:    Talks on phone: Not on file    Gets together: Not on file    Attends religious service: Not on file    Active member of club or organization: Not on file    Attends meetings of clubs or organizations: Not on file    Relationship status: Not on file  . Intimate partner violence:    Fear of current or ex partner: Not on file    Emotionally abused: Not on file    Physically abused: Not on file    Forced sexual activity: Not on file  Other Topics Concern  . Not on file  Social History Narrative  . Not on file    The patient currently resides (home / rehab facility / nursing home): Assisted Living The patient normally is (ambulatory /  bedbound): Bed Bound  FAMILY HISTORY:  Family History  Problem Relation Age of Onset  . Hypertension Mother      REVIEW OF SYSTEMS:  Constitutional: denies weight loss, fever, chills, or sweats  Eyes: denies any other vision changes, history of eye injury  ENT: denies sore throat, hearing problems  Respiratory: denies shortness of breath, wheezing  Cardiovascular: denies chest pain, palpitations  Gastrointestinal: denies abdominal pain, N/V, or diarrhea/and bowel function as per HPI Genitourinary: denies burning with urination or urinary frequency Musculoskeletal: denies any other joint pains or cramps  Skin: denies any other rashes or skin discolorations  Neurological: denies any other headache, dizziness, weakness  Psychiatric: denies any other depression, anxiety   All other review of systems were negative   VITAL SIGNS:  Temp:  [97.4 F (36.3 C)-98.5 F (36.9 C)] 97.4 F (36.3 C) (10/16 0831) Pulse Rate:  [69-102] 69 (10/16 0831) Resp:  [16-26] 20 (10/16 0402) BP: (121-170)/(57-107) 166/96 (10/16 0831) SpO2:  [93 %-100 %] 95 % (10/16 0831) Weight:  [88.7 kg] 88.7 kg (10/15 1915)     Height: 5\' 4"  (162.6 cm) Weight: 88.7 kg BMI (Calculated): 33.55   INTAKE/OUTPUT:  This shift: No intake/output data recorded.  Last 2 shifts: @IOLAST2SHIFTS @   PHYSICAL EXAM:  Constitutional:  -- Obese body habitus  -- Awake, alert, and oriented x3, no apparent distress Eyes:  -- Pupils equally round and reactive to light  -- No scleral icterus, B/L no occular discharge Ear, nose, throat: -- Neck is FROM WNL -- Ponder in place Pulmonary:  -- No wheezes or rhales -- Equal breath sounds bilaterally but diminished -- Breathing non-labored at rest Cardiovascular:  -- S1, S2 present  -- No pericardial rubs  Gastrointestinal:  -- Abdomen soft, nontender, non-distended, no guarding or rebound tenderness -- No abdominal masses appreciated, pulsatile or otherwise  -- Negative Murphy's  sign Musculoskeletal and Integumentary:  -- Wounds or skin discoloration:  -- Extremities:  - LUE contracture of the left hand, decreased strength  - RUE: FROM, 5/5 strength -- No peripheral edema or calf tenderness Neurologic:  -- Motor function: See extremities -- Sensation: Intact  Psychiatric:  -- Mood and affect WNL      Labs:  CBC Latest Ref Rng & Units 08/10/2018 08/09/2018 11/19/2016  WBC 4.0 - 10.5 K/uL 16.6(H) 16.3(H) 10.0  Hemoglobin 12.0 - 15.0 g/dL 11.9(L) 12.2 10.4(L)  Hematocrit 36.0 - 46.0 %  39.5 40.4 32.2(L)  Platelets 150 - 400 K/uL 216 238 543(H)   CMP Latest Ref Rng & Units 08/10/2018 08/09/2018 11/19/2016  Glucose 70 - 99 mg/dL 295(A) 213(Y) 78  BUN 8 - 23 mg/dL 18 19 10   Creatinine 0.44 - 1.00 mg/dL 8.65 7.84 6.96  Sodium 135 - 145 mmol/L 135 134(L) 137  Potassium 3.5 - 5.1 mmol/L 3.4(L) 3.4(L) 3.6  Chloride 98 - 111 mmol/L 92(L) 90(L) 98(L)  CO2 22 - 32 mmol/L 33(H) 33(H) 30  Calcium 8.9 - 10.3 mg/dL 2.9(B) 8.3(L) 8.3(L)  Total Protein 6.5 - 8.1 g/dL 7.0 7.1 7.1  Total Bilirubin 0.3 - 1.2 mg/dL 5.6(H) 4.8(H) 0.4  Alkaline Phos 38 - 126 U/L 370(H) 360(H) 83  AST 15 - 41 U/L 94(H) 72(H) 13(L)  ALT 0 - 44 U/L 114(H) 94(H) <5(L)   Imaging studies:  [place] RUQ Limited on 08/10/18: Cholelithiasis with findings suggestive of distal common bile duct stone with intrahepatic biliary ductal dilatation. Non emergent workup with MRCP or ERCP may be helpful.  Assessment/Plan: (ICD-10's: K5.86) 77 y.o. female with essentially asymptomatic incidental finding of choledocholithiasis and hyperbilirubinemia in context of presentation for recurrent respiratory distress, complicated by supratherapeutic INR attributed to Coumadin coagulopathy for chronic atrial fibrillation with history of post-operative stroke and by additional comorbidities including COPD on chronic supplemental home oxygen with history of empyema s/p complicated post-surgical/anesthesia course, morbid obesity  (BMI 34), and former tobacco abuse (smoking).   - continue IV antibiotics  - ERCP as per GI, Dr. Servando Snare to see tomorrow with MRCP pending  - all risks, benefits, and alternatives to cholecystectomy were discussed at length with the patient and her daughters (bedside), all of their questions were answered to their expressed satisfaction, patient and her daughters state repeatedly and consistently that she is not interested in surgery under any circumstances, considering her respiratory and other comorbidities  - medical management of chronic medical comorbidities as per primary medical team  - will signoff, but please call if any questions, concerns, or changes   All of the above findings and recommendations were discussed at length with the patient and her very well informed family, and all of patient's and her family's questions were answered to their expressed satisfaction.  Thank you for the opportunity to participate in this patient's care.   -- Lynden Oxford, PA-C [facility] 08/10/2018, 2:13 PM [phone] M-F: 7am - 4pm

## 2018-08-10 NOTE — Progress Notes (Signed)
Pharmacy Antibiotic Note  Mikayla [name] is a 77 y.o. female admitted on 08/09/2018 with pneumonia.  Pharmacy has been consulted for vancomycin and cefepime dosing.  Plan: DW 68kg  Vd 48L] kei 0.047 hr-1  T1/2 15 hours Vancomycin 1 gram q 18 hours ordered with stacked dosing. Level before 5th dose. Goal trough 15-20  Cefepime 2 grams q 12 hours ordered  Height: 5\' 4"  (162.6 cm) Weight: 195 lb 8.8 oz (88.7 kg) IBW/kg (Calculated) : 54.7  Temp (24hrs), Avg:97.7 F (36.5 C), Min:97.7 F (36.5 C), Max:97.7 F (36.5 C)  Recent Labs  Lab 08/09/18 1925 08/09/18 2121  WBC 16.3*  --   CREATININE 0.99  --   LATICACIDVEN 1.1 0.8    Estimated Creatinine Clearance: 51.3 mL/min (by C-G formula based on SCr of 0.99 mg/dL).    Allergies  Allergen Reactions  . Other Nausea And Vomiting  . Sulfa Antibiotics Hives  . Oxycodone Anxiety and Other (See Comments)    Antimicrobials this admission: Vancomycin, cefepime 10/15 >>    >>   Dose adjustments this admission:   Microbiology results: 10/15 BCx:  pending 10/16 MRSA PCR:  pending      10/15 CXR: pneumonia not excluded Thank you for allowing pharmacy to be a part of this patient's care.  Myrta Mercer S 08/10/2018 1:18 AM

## 2018-08-10 NOTE — Clinical Social Work Note (Signed)
Clinical Social Work Assessment  Patient Details  Name: Mikayla [name] MRN: [id number] Date of Birth: Oct 29, 1940  Date of referral:  08/10/18               Reason for consult:  Facility Placement                Permission sought to share information with:  Case Manager, Magazine features editor, Family Supports Permission granted to share information::  Yes, Verbal Permission Granted  Name::        Agency::     Relationship::     Contact Information:     Housing/Transportation Living arrangements for the past 2 months:  Skilled Building surveyor of Information:  Patient, Facility Patient Interpreter Needed:  None Criminal Activity/Legal Involvement Pertinent to Current Situation/Hospitalization:  No - Comment as needed Significant Relationships:  Adult Children Lives with:  Facility Resident Do you feel safe going back to the place where you live?  Yes Need for family participation in patient care:  Yes (Comment)  Care giving concerns:  Patient is a long term resident at [facility].    Social Worker assessment / plan:  CSW consulted for facility placement. CSW spoke with patient regarding discharge disposition. Patient is confused but is able to state that she lives at [facility]. No other family in room. CSW confirmed with [name] at [facility] that patient is a long term resident and can return when ready. CSW will follow for discharge planning.   Employment status:  Retired Database administrator PT Recommendations:  Not assessed at this time Information / Referral to community resources:     Patient/Family's Response to care:  Patient thanked CSW for assistance   Patient/Family's Understanding of and Emotional Response to Diagnosis, Current Treatment, and Prognosis:  Patient unable to fully understand her current condition due to confusion   Emotional Assessment Appearance:  Appears stated age Attitude/Demeanor/Rapport:     Affect (typically observed):  Accepting, Quiet Orientation:  Oriented to Self Alcohol / Substance use:  Not Applicable Psych involvement (Current and /or in the community):  No (Comment)  Discharge Needs  Concerns to be addressed:  Discharge Planning Concerns Readmission within the last 30 days:  No Current discharge risk:  None Barriers to Discharge:  Continued Medical Work up   [employer], LCSWA 08/10/2018, 10:48 AM

## 2018-08-10 NOTE — [place] FL2 (Signed)
MEDICAID FL2 LEVEL OF CARE SCREENING TOOL     IDENTIFICATION  Patient Name: Mikayla [name] Birthdate: June 13, 1941 Sex: female Admission Date (Current Location): 08/09/2018  [facility] and [place] Number:  Chiropodist and Address:  [facility], [address], [place], [place] [address]      Provider Number: [id number]  Attending Physician Name and Address:  Bertrum Sol, MD  Relative Name and Phone Number:       Current Level of Care: Hospital Recommended Level of Care: Skilled Nursing Facility Prior Approval Number:    Date Approved/Denied:   PASRR Number:    Discharge Plan: SNF    Current Diagnoses: Patient Active Problem List   Diagnosis Date Noted  . Acute respiratory failure (HCC) 08/10/2018  . Cellulitis 11/20/2016  . Sepsis (HCC) 10/23/2016  . Pressure injury of skin 09/30/2016  . Palliative care encounter   . Goals of care, counseling/discussion   . Encounter for hospice care discussion   . Septic shock (HCC) 09/28/2016  . Diarrhea 09/28/2016    Orientation RESPIRATION BLADDER Height & Weight     Self  O2(2 liters ) Incontinent Weight: 195 lb 8.8 oz (88.7 kg) Height:  5\' 4"  (162.6 cm)  BEHAVIORAL SYMPTOMS/MOOD NEUROLOGICAL BOWEL NUTRITION STATUS  (none) (none) Continent Diet(NPO to be advanced )  AMBULATORY STATUS COMMUNICATION OF NEEDS Skin   Extensive Assist Verbally Other (Comment)(Stage 2 left buttocks)                       Personal Care Assistance Level of Assistance  Bathing, Feeding, Dressing Bathing Assistance: Limited assistance Feeding assistance: Independent Dressing Assistance: Limited assistance     Functional Limitations Info  Sight, Hearing, Speech Sight Info: Adequate Hearing Info: Adequate Speech Info: Adequate    SPECIAL CARE FACTORS FREQUENCY                       Contractures Contractures Info: Not present    Additional Factors Info  Code Status,  Allergies Code Status Info: DNR Allergies Info: Levaquin Levofloxacin, Other, Sulfa Antibiotics, Oxycodone           Current Medications (08/10/2018):  This is the current hospital active medication list Current Facility-Administered Medications  Medication Dose Route Frequency Provider Last Rate Last Dose  . 0.9 %  sodium chloride infusion   Intravenous Continuous Cammy Copa, MD   Stopped at 08/10/18 0532  . acetaminophen (TYLENOL) tablet 650 mg  650 mg Oral Q6H PRN Cammy Copa, MD       Or  . acetaminophen (TYLENOL) suppository 650 mg  650 mg Rectal Q6H PRN Cammy Copa, MD      . amiodarone (PACERONE) tablet 200 mg  200 mg Oral Daily Cammy Copa, MD   200 mg at 08/10/18 9562  . bisacodyl (DULCOLAX) EC tablet 5 mg  5 mg Oral Daily PRN Cammy Copa, MD      . carbidopa-levodopa (SINEMET IR) 10-100 MG per tablet immediate release 1 tablet  1 tablet Oral TID Cammy Copa, MD   1 tablet at 08/10/18 0853  . carvedilol (COREG) tablet 6.25 mg  6.25 mg Oral BID WC Cammy Copa, MD   6.25 mg at 08/10/18 0852  . ceFEPIme (MAXIPIME) 2 g in sodium chloride 0.9 % 100 mL IVPB  2 g Intravenous Q12H Willy Eddy, MD 200 mL/hr at 08/10/18 0857 2 g at 08/10/18 0857  . docusate sodium (COLACE) capsule 100 mg  100 mg Oral BID Cammy Copa, MD   100 mg at 08/10/18 0851  . furosemide (LASIX) tablet 20 mg  20 mg Oral Daily Cammy Copa, MD   20 mg at 08/10/18 0852  . gabapentin (NEURONTIN) capsule 300 mg  300 mg Oral TID Cammy Copa, MD   300 mg at 08/10/18 0852  . HYDROcodone-acetaminophen (NORCO/VICODIN) 5-325 MG per tablet 1-2 tablet  1-2 tablet Oral Q4H PRN Cammy Copa, MD      . ipratropium-albuterol (DUONEB) 0.5-2.5 (3) MG/3ML nebulizer solution 3 mL  3 mL Nebulization Q6H Cammy Copa, MD   3 mL at 08/10/18 0801  . Melatonin TABS 5 mg  5 mg Oral QHS Cammy Copa, MD      . multivitamin with minerals tablet 1 tablet  1 tablet Oral Daily Cammy Copa, MD   1 tablet at 08/10/18 [phone]   . ondansetron (ZOFRAN) tablet 4 mg  4 mg Oral Q6H PRN Cammy Copa, MD       Or  . ondansetron [facility]) injection 4 mg  4 mg Intravenous Q6H PRN Cammy Copa, MD      . pantoprazole (PROTONIX) EC tablet 40 mg  40 mg Oral BID Cammy Copa, MD   40 mg at 08/10/18 0902  . PARoxetine (PAXIL) tablet 30 mg  30 mg Oral Daily Cammy Copa, MD   30 mg at 08/10/18 0853  . potassium chloride SA (K-DUR,KLOR-CON) CR tablet 20 mEq  20 mEq Oral Daily Cammy Copa, MD   20 mEq at 08/10/18 0902  . sodium chloride (OCEAN) 0.65 % nasal spray 1 spray  1 spray Each Nare Q2H PRN Cammy Copa, MD      . traZODone (DESYREL) tablet 25 mg  25 mg Oral QHS Cammy Copa, MD      . traZODone (DESYREL) tablet 25 mg  25 mg Oral QHS PRN Cammy Copa, MD         Discharge Medications: Please see discharge summary for a list of discharge medications.  Relevant Imaging Results:  Relevant Lab Results:   Additional Information    Janeese Mcgloin  Rinaldo Ratel, [address]

## 2018-08-10 NOTE — Progress Notes (Signed)
MRI called to take patient to imaging for MRI. Discussed with patient and daughter at bedside. Patient does not want an MRI. MD notified.

## 2018-08-10 NOTE — Progress Notes (Signed)
Patient stated that she does not want ERCP/MRCP. Patient education given to patient and daughter at bedside. Patient has discussed her decision with her daughters and they are supportive of her decision. MD notified (Dr. Anne Hahn).

## 2018-08-10 NOTE — Consult Note (Signed)
   , MD [address], [address], [place], [place], [address] [address], [address], [place], [place], [address] Phone: [phone]  Fax: [phone]  Consultation  Referring Provider:   Dr Salary  Primary Care Physician:  System, Pcp Not In Primary Gastroenterologist:  None          Reason for Consultation:     CBD stone   Date of Admission:  08/09/2018 Date of Consultation:  08/10/2018         HPI:   Mikayla [name] is a 76 y.o. female admitted on 08/10/18 with respiratory failure, recent pneumonia , Xr ay in the ER showed possible pneumonia. USG abdomen showed cholilithiasis , CBD stone and intrahepatic biliary dilation. CBD- 11 mm. T bilirubin on admission was 4.8 , elevated alk phos ,AST,ALT. 1 year back LFT's were normal .   She is on coumadin and had a High INR.   Denies any abdominal pain or fever. Shortness of breath last few days, she does not ambulate at baseline. Says she feels better after coming into the hospital.   Past Medical History:  Diagnosis Date  . Atrial fibrillation (HCC)   . Empyema (HCC)    in [facility] Aug 2017- stayed in hospital on for 2 months.  . Hypertension     Past Surgical History:  Procedure Laterality Date  . PEG TUBE PLACEMENT      Prior to Admission medications   Medication Sig Start Date End Date Taking? Authorizing Provider  acetaminophen (TYLENOL) 500 MG tablet Take 1,000 mg by mouth every 8 (eight) hours as needed for moderate pain or fever.   Yes [provider]  albuterol (PROVENTIL) (2.5 MG/3ML) 0.083% nebulizer solution Take 2.5 mg by nebulization every 8 (eight) hours.   Yes [provider]  amiodarone (PACERONE) 200 MG tablet Take 1 tablet (200 mg total) by mouth daily. 11/22/16  Yes Chen, Qing, MD  carbidopa-levodopa (SINEMET IR) 10-100 MG tablet Take 1 tablet by mouth 3 (three) times daily.   Yes [provider]  carvedilol (COREG) 6.25 MG tablet Take 1 tablet (6.25 mg total) by mouth  2 (two) times daily with a meal. 11/21/16  Yes Chen, Qing, MD  celecoxib (CELEBREX) 50 MG capsule Take 50 mg by mouth 2 (two) times daily.    Yes [provider]  ferrous sulfate 324 (65 Fe) MG TBEC Take 1 tablet by mouth daily.   Yes [provider]  furosemide (LASIX) 20 MG tablet Take 1 tablet (20 mg total) by mouth daily. Patient taking differently: Take 40 mg by mouth daily.  10/30/16  Yes Sudini, Srikar, MD  gabapentin (NEURONTIN) 300 MG capsule Take 300 mg by mouth 3 (three) times daily.   Yes [provider]  Melatonin 5 MG TABS Take 5 mg by mouth at bedtime.   Yes [provider]  Multiple Vitamin (MULTIVITAMIN WITH MINERALS) TABS tablet Take 1 tablet by mouth daily.   Yes [provider]  omeprazole (PRILOSEC OTC) 20 MG tablet Take 20 mg by mouth 2 (two) times daily.   Yes [provider]  PARoxetine (PAXIL) 30 MG tablet Take 30 mg by mouth daily.    Yes [provider]  potassium chloride SA (K-DUR,KLOR-CON) 20 MEQ tablet Take 20 mEq by mouth daily.   Yes [provider]  senna (SENOKOT) 8.6 MG TABS tablet Take 1 tablet by mouth daily.   Yes [provider]  sodium chloride (OCEAN) 0.65 % SOLN nasal   spray Place 1 spray into both nostrils every 2 (two) hours as needed for congestion (allergies).   Yes [provider]  warfarin (COUMADIN) 4 MG tablet Take 1 tablet (4 mg total) by mouth daily. 11/21/16  Yes Demetrios Loll, MD  amoxicillin-clavulanate (AUGMENTIN) 875-125 MG tablet Take 1 tablet by mouth every 12 (twelve) hours. Patient not taking: Reported on 08/10/2018 11/21/16   Demetrios Loll, MD  ipratropium-albuterol (DUONEB) 0.5-2.5 (3) MG/3ML SOLN Take 3 mLs by nebulization 3 (three) times daily.    [provider]  traZODone (DESYREL) 50 MG tablet Take 25 mg by mouth at bedtime.    [provider]    Family History  Problem Relation Age of Onset  . Hypertension Mother      Social  History   Tobacco Use  . Smoking status: Former Smoker    Types: Cigarettes  . Smokeless tobacco: Former Systems developer  . Tobacco comment: quit in 2001. smoked for 30 years  Substance Use Topics  . Alcohol use: No  . Drug use: No    Allergies as of 08/09/2018 - Review Complete 08/09/2018  Allergen Reaction Noted  . Other Nausea And Vomiting 06/02/2016  . Sulfa antibiotics Hives 03/22/2015  . Oxycodone Anxiety and Other (See Comments) 08/25/2016    Review of Systems:    All systems reviewed and negative except where noted in HPI.   Physical Exam:  Vital signs in last 24 hours: Temp:  [97.4 F (36.3 C)-98.5 F (36.9 C)] 97.4 F (36.3 C) (10/16 0831) Pulse Rate:  [69-102] 69 (10/16 0831) Resp:  [16-26] 20 (10/16 0402) BP: (121-170)/(57-107) 166/96 (10/16 0831) SpO2:  [93 %-100 %] 95 % (10/16 0831) Weight:  [88.7 kg] 88.7 kg (10/15 1915)   General:   Pleasant, cooperative in NAD Head:  Normocephalic and atraumatic. Eyes:   No icterus.   Conjunctiva pink. PERRLA. Ears:  Normal auditory acuity. Neck:  Supple; no masses or thyroidomegaly Lungs: Respirations even and unlabored. Lungs decreased air entry b/l    No wheezes, crackles, or rhonchi.  Heart:  Regular rate and rhythm;  Without murmur, clicks, rubs or gallops Abdomen:  Soft, nondistended, nontender. Normal bowel sounds. No appreciable masses or hepatomegaly.  No rebound or guarding.  Neurologic:  Alert and oriented x3;  grossly normal neurologically. Skin:  Intact without significant lesions or rashes. Cervical Nodes:  No significant cervical adenopathy. Psych:  Alert and cooperative. Normal affect.  LAB RESULTS: Recent Labs    08/09/18 1925 08/10/18 0410  WBC 16.3* 16.6*  HGB 12.2 11.9*  HCT 40.4 39.5  PLT 238 216   BMET Recent Labs    08/09/18 1925 08/10/18 0410  NA 134* 135  K 3.4* 3.4*  CL 90* 92*  CO2 33* 33*  GLUCOSE 154* 199*  BUN 19 18  CREATININE 0.99 0.87  CALCIUM 8.3* 8.5*   LFT Recent Labs     08/09/18 1925  PROT 7.1  ALBUMIN 3.0*  AST 72*  ALT 94*  ALKPHOS 360*  BILITOT 4.8*   PT/INR Recent Labs    08/09/18 1925  LABPROT 53.1*  INR 6.08*    STUDIES: Dg Chest 2 View  Result Date: 08/09/2018 CLINICAL DATA:  Respiratory distress. Recent treatment for pneumonia. Wheezing. EXAM: CHEST - 2 VIEW COMPARISON:  10/24/2016 FINDINGS: The patient is rotated towards the right. Heart size is normal. There is chronic patchy density at both lung bases right more than left. This could represent scarring, but a degree of atelectasis or basilar pneumonia  is not excluded. There is no new abnormality seen however. There are chronic displaced rib fractures on the right. IMPRESSION: Chronic markings at the lung bases that could be scarring. However, a degree of active atelectasis or pneumonia is not excluded. Chronic displaced rib fractures on the right. Electronically Signed   By: Mark  Shogry M.D.   On: 08/09/2018 20:29   Us Abdomen Limited Ruq  Result Date: 08/09/2018 CLINICAL DATA:  Elevated bilirubin EXAM: ULTRASOUND ABDOMEN LIMITED RIGHT UPPER QUADRANT COMPARISON:  CT from 11/20/2016 FINDINGS: Gallbladder: Gallbladder is partially distended with multiple calcified gallstones within. No wall thickening or pericholecystic fluid is noted. Negative sonographic Murphy's sign is noted. Common bile duct: Diameter: 11 mm, there are suggested echogenicities in the distal common bile duct. Liver: No focal mass lesion is noted. Mild increased echogenicity is noted consistent with fatty infiltration. Additionally some intrahepatic ductal dilatation is noted. Portal vein is patent on color Doppler imaging with normal direction of blood flow towards the liver. IMPRESSION: Cholelithiasis with findings suggestive of distal common bile duct stone with intrahepatic biliary ductal dilatation. Nonemergent workup with MRCP or ERCP may be helpful. Electronically Signed   By: Mark  Lukens M.D.   On: 08/09/2018 21:52        Impression / Plan:   Marguita R Farquharson is a 76 y.o. y/o female admitted with respiratory failure likely secondary to pneumonia.  Cholidocholithiasis on RUQ USG, T bilirubin > 4 and CBD 11 mm . INR 6 on admission    Plan  1. Correct INR to < 1.5  2. Treat pneumonia  3. She will need an ERCP- Her respiratory status at this time would put her at extremely high risk for anesthesia due to respiratory failure /pneumonia . I would think  atleast 24-48 hours of antibiotics would make it safer prior to  an attempt at ERCP with stone extraction or just placement of a stent based on INR. IF she were to have fever or signs of cholangitis - then options would need to be re evaluated in terms of a PTC tube vs emergency ERCP with stenting  4. She will eventually need a cholecystectomy. 5.Dr Wohl will assess her again tomorrow for ERCP timing.   Thank you for involving me in the care of this patient.      LOS: 0 days    , MD  08/10/2018, 9:17 AM    

## 2018-08-10 NOTE — H&P (Signed)
[facility] [place] [facility]   PATIENT NAME: Mikayla [name]    MR#:  [id number]  DATE OF BIRTH:  25-Dec-1940  DATE OF ADMISSION:  08/09/2018  PRIMARY CARE PHYSICIAN: System, Pcp Not In   REQUESTING/REFERRING PHYSICIAN:   CHIEF COMPLAINT:   Chief Complaint  Patient presents with  . Shortness of Breath    HISTORY OF PRESENT ILLNESS: Mikayla [name]  is a 77 y.o. female with a known history of atrial fibrillation, hypertension and other comorbidities. Patient is a poor historian and currently in acute respiratory distress, unable to provide reliable history.  Most of the information was taken from reviewing the medical chart and from discussion with emergency room physician. She was brought to emergency room due to respiratory distress, tachycardia and fever.  She is only requiring supplemental oxygenation in addition to her baseline 1 to 2 L oxygen per nasal cannula.  No reports of nausea, vomiting, chest pain, abdominal pain, bleeding. Per patient's husband, she was recently started on antibiotics for presumed pneumonia. Blood test done emergency room are notable for elevated INR at 6, bilirubin at 4.5, WBC 16.3.  Lactic acid is normal. Chest x-ray shows chronic markings at the lung bases that could be scarring. However, a degree of active atelectasis or pneumonia is not excluded. Abdominal ultrasound shows cholelithiasis with findings suggestive of distal common bile duct stone with intrahepatic biliary ductal dilatation. Patient is admitted for further evaluation and treatment.  PAST MEDICAL HISTORY:   Past Medical History:  Diagnosis Date  . Atrial fibrillation (HCC)   . Empyema [facility])    in [facility] Aug 2017- stayed in hospital on for 2 months.  . Hypertension     PAST SURGICAL HISTORY:  Past Surgical History:  Procedure Laterality Date  . PEG TUBE PLACEMENT      SOCIAL HISTORY:  Social History   Tobacco Use  . Smoking status: Former  Smoker    Types: Cigarettes  . Smokeless tobacco: Former Neurosurgeon  . Tobacco comment: quit in 2001. smoked for 30 years  Substance Use Topics  . Alcohol use: No    FAMILY HISTORY:  Family History  Problem Relation Age of Onset  . Hypertension Mother     DRUG ALLERGIES:  Allergies  Allergen Reactions  . Other Nausea And Vomiting  . Sulfa Antibiotics Hives  . Oxycodone Anxiety and Other (See Comments)    REVIEW OF SYSTEMS:   Unable to obtain, due to patient being poor historian and not feeling well at this time.  MEDICATIONS AT HOME:  Prior to Admission medications   Medication Sig Start Date End Date Taking? Authorizing Provider  acetaminophen (TYLENOL) 500 MG tablet Take 1,000 mg by mouth every 8 (eight) hours as needed for moderate pain or fever.    [provider]  amiodarone (PACERONE) 200 MG tablet Take 1 tablet (200 mg total) by mouth daily. 11/22/16   Shaune Pollack, MD  amoxicillin-clavulanate (AUGMENTIN) 875-125 MG tablet Take 1 tablet by mouth every 12 (twelve) hours. 11/21/16   Shaune Pollack, MD  carbidopa-levodopa (SINEMET IR) 10-100 MG tablet Take 1 tablet by mouth 3 (three) times daily.    [provider]  carvedilol (COREG) 6.25 MG tablet Take 1 tablet (6.25 mg total) by mouth 2 (two) times daily with a meal. 11/21/16   Shaune Pollack, MD  celecoxib (CELEBREX) 100 MG capsule Take 100 mg by mouth 2 (two) times daily.    [provider]  furosemide (LASIX) 20 MG tablet  Take 1 tablet (20 mg total) by mouth daily. 10/30/16   Milagros Loll, MD  gabapentin (NEURONTIN) 300 MG capsule Take 300 mg by mouth 3 (three) times daily.    [provider]  ipratropium-albuterol (DUONEB) 0.5-2.5 (3) MG/3ML SOLN Take 3 mLs by nebulization 3 (three) times daily.    [provider]  Melatonin 5 MG TABS Take 5 mg by mouth at bedtime.    [provider]  Multiple Vitamin (MULTIVITAMIN WITH MINERALS) TABS tablet Take 1 tablet by mouth daily.     [provider]  omeprazole (PRILOSEC OTC) 20 MG tablet Take 20 mg by mouth 2 (two) times daily.    [provider]  PARoxetine (PAXIL) 30 MG tablet Take 30 mg by mouth daily.     [provider]  potassium chloride SA (K-DUR,KLOR-CON) 20 MEQ tablet Take 20 mEq by mouth daily.    [provider]  sodium chloride (OCEAN) 0.65 % SOLN nasal spray Place 1 spray into both nostrils every 2 (two) hours as needed for congestion (allergies).    [provider]  traZODone (DESYREL) 50 MG tablet Take 25 mg by mouth at bedtime.    [provider]  warfarin (COUMADIN) 4 MG tablet Take 1 tablet (4 mg total) by mouth daily. 11/21/16   Shaune Pollack, MD      PHYSICAL EXAMINATION:   VITAL SIGNS: Blood pressure (!) 141/66, pulse 70, resp. rate 19, height 5\' 4"  (1.626 m), weight 88.7 kg, SpO2 96 %.  GENERAL:  77 y.o.-year-old patient lying in the bed with moderate respiratory distress.  She looks chronically ill-appearing. EYES: Pupils equal, round, reactive to light and accommodation.  Bilateral scleral icterus noted. Extraocular muscles intact.  HEENT: Head atraumatic, normocephalic. Oropharynx and nasopharynx clear.  NECK:  Supple, no jugular venous distention. No thyroid enlargement, no tenderness.  LUNGS: Reduced breath sounds bilaterally, no wheezing, rales,rhonchi or crepitation. No use of accessory muscles of respiration.  CARDIOVASCULAR: S1, S2 normal. No S3/S4.  ABDOMEN: Soft, nontender, nondistended. Bowel sounds present. No organomegaly or mass.  EXTREMITIES: No pedal edema, cyanosis, or clubbing.  NEUROLOGIC: No focal weakness. PSYCHIATRIC: The patient is alert, but drowsy.  SKIN: No obvious rash, lesion, or ulcer.   LABORATORY PANEL:   CBC Recent Labs  Lab 08/09/18 1925  WBC 16.3*  HGB 12.2  HCT 40.4  PLT 238  MCV 89.4  MCH 27.0  MCHC 30.2  RDW 17.7*  LYMPHSABS 0.8  MONOABS 0.6  EOSABS 0.2  BASOSABS 0.1    ------------------------------------------------------------------------------------------------------------------  Chemistries  Recent Labs  Lab 08/09/18 1925  NA 134*  K 3.4*  CL 90*  CO2 33*  GLUCOSE 154*  BUN 19  CREATININE 0.99  CALCIUM 8.3*  AST 72*  ALT 94*  ALKPHOS 360*  BILITOT 4.8*   ------------------------------------------------------------------------------------------------------------------ estimated creatinine clearance is 51.3 mL/min (by C-G formula based on SCr of 0.99 mg/dL). ------------------------------------------------------------------------------------------------------------------ No results for input(s): TSH, T4TOTAL, T3FREE, THYROIDAB in the last 72 hours.  Invalid input(s): FREET3   Coagulation profile Recent Labs  Lab 08/09/18 1925  INR 6.08*   ------------------------------------------------------------------------------------------------------------------- No results for input(s): DDIMER in the last 72 hours. -------------------------------------------------------------------------------------------------------------------  Cardiac Enzymes No results for input(s): CKMB, TROPONINI, MYOGLOBIN in the last 168 hours.  Invalid input(s): CK ------------------------------------------------------------------------------------------------------------------ Invalid input(s): POCBNP  ---------------------------------------------------------------------------------------------------------------  Urinalysis    Component Value Date/Time   COLORURINE YELLOW (A) 10/23/2016 1542   APPEARANCEUR HAZY (A) 10/23/2016 1542   LABSPEC 1.008 10/23/2016 1542   PHURINE 6.0  10/23/2016 1542   GLUCOSEU NEGATIVE 10/23/2016 1542   HGBUR MODERATE (A) 10/23/2016 1542   BILIRUBINUR NEGATIVE 10/23/2016 1542   KETONESUR NEGATIVE 10/23/2016 1542   PROTEINUR NEGATIVE 10/23/2016 1542   NITRITE NEGATIVE 10/23/2016 1542   LEUKOCYTESUR SMALL (A) 10/23/2016 1542      RADIOLOGY: Dg Chest 2 View  Result Date: 08/09/2018 CLINICAL DATA:  Respiratory distress. Recent treatment for pneumonia. Wheezing. EXAM: CHEST - 2 VIEW COMPARISON:  10/24/2016 FINDINGS: The patient is rotated towards the right. Heart size is normal. There is chronic patchy density at both lung bases right more than left. This could represent scarring, but a degree of atelectasis or basilar pneumonia is not excluded. There is no new abnormality seen however. There are chronic displaced rib fractures on the right. IMPRESSION: Chronic markings at the lung bases that could be scarring. However, a degree of active atelectasis or pneumonia is not excluded. Chronic displaced rib fractures on the right. Electronically Signed   By: Paulina Fusi M.D.   On: 08/09/2018 20:29   US Abdomen Limited Ruq  Result Date: 08/09/2018 CLINICAL DATA:  Elevated bilirubin EXAM: ULTRASOUND ABDOMEN LIMITED RIGHT UPPER QUADRANT COMPARISON:  CT from 11/20/2016 FINDINGS: Gallbladder: Gallbladder is partially distended with multiple calcified gallstones within. No wall thickening or pericholecystic fluid is noted. Negative sonographic Murphy's sign is noted. Common bile duct: Diameter: 11 mm, there are suggested echogenicities in the distal common bile duct. Liver: No focal mass lesion is noted. Mild increased echogenicity is noted consistent with fatty infiltration. Additionally some intrahepatic ductal dilatation is noted. Portal vein is patent on color Doppler imaging with normal direction of blood flow towards the liver. IMPRESSION: Cholelithiasis with findings suggestive of distal common bile duct stone with intrahepatic biliary ductal dilatation. Nonemergent workup with MRCP or ERCP may be helpful. Electronically Signed   By: Alcide Clever M.D.   On: 08/09/2018 21:52    EKG: Orders placed or performed during the hospital encounter of 08/09/18  . EKG 12-Lead  . EKG 12-Lead    IMPRESSION AND PLAN:  1.  Sepsis likely  secondary to pneumonia.  We will start treatment with IV antibiotics, oxygen and nebulizers.  Continue to monitor clinically closely. 2.  Acute on chronic respiratory failure, secondary to pneumonia.  See treatment as above under #1. 3.  Hyperbilirubinemia, secondary to CBD obstruction due to calculus.  Will further evaluate with ERCP. GI is consulted for further evaluation and treatment. 4.  Supratherapeutic INR.  No active bleeding.  We will hold Coumadin for now and continue to monitor INR daily. 5.  Paroxysmal atrial fibrillation, currently with sinus tachycardia.  Continue Coreg.  Will hold Coumadin due to elevated INR.  All the records are reviewed and case discussed with ED provider. Management plans discussed with the patient, family and they are in agreement.  CODE STATUS: DNR Code Status History    Date Active Date Inactive Code Status Order ID Comments User Context   11/20/2016 0904 11/21/2016 1854 DNR [id number]  Arnaldo Natal, MD Inpatient   10/24/2016 [phone] 10/30/2016 2126 DNR [id number]  Shaune Pollack, MD Inpatient   09/28/2016 1555 10/02/2016 1523 DNR [id number]  Altamese Dilling, MD Inpatient    Questions for Most Recent Historical Code Status (Order [id number])    Question Answer Comment   In the event of cardiac or respiratory ARREST Do not call a "code blue"    In the event of cardiac or respiratory ARREST Do not perform Intubation, CPR, defibrillation or ACLS  In the event of cardiac or respiratory ARREST Use medication by any route, position, wound care, and other measures to relive pain and suffering. May use oxygen, suction and manual treatment of airway obstruction as needed for comfort.         Advance Directive Documentation     Most Recent Value  Type of Advance Directive  Out of facility DNR (pink MOST or yellow form)  Pre-existing out of facility DNR order (yellow form or pink MOST form)  -  "MOST" Form in Place?  -       TOTAL TIME TAKING CARE OF THIS  PATIENT: 55 minutes.    Cammy Copa M.D on 08/10/2018 at 12:40 AM  Between 7am to 6pm - Pager - [phone]  After 6pm go to [email or web address] - password [email or web address] [facility] [place] at [facility]  [phone]  CC: Primary care physician; System, Pcp Not In

## 2018-08-11 LAB — COMPREHENSIVE METABOLIC PANEL
ALBUMIN: 2.8 g/dL — AB (ref 3.5–5.0)
ALT: 121 U/L — AB (ref 0–44)
AST: 137 U/L — AB (ref 15–41)
Alkaline Phosphatase: 417 U/L — ABNORMAL HIGH (ref 38–126)
Anion gap: 13 (ref 5–15)
BUN: 17 mg/dL (ref 8–23)
CHLORIDE: 92 mmol/L — AB (ref 98–111)
CO2: 34 mmol/L — AB (ref 22–32)
CREATININE: 0.71 mg/dL (ref 0.44–1.00)
Calcium: 9 mg/dL (ref 8.9–10.3)
GFR calc non Af Amer: >60 mL/min (ref >60)
Glucose, Bld: 99 mg/dL (ref 70–99)
Potassium: 3.4 mmol/L — ABNORMAL LOW (ref 3.5–5.1)
SODIUM: 139 mmol/L (ref 135–145)
Total Bilirubin: 7.7 mg/dL — ABNORMAL HIGH (ref 0.3–1.2)
Total Protein: 7.4 g/dL (ref 6.5–8.1)

## 2018-08-11 LAB — CBC
HEMATOCRIT: 40.6 % (ref 36.0–46.0)
Hemoglobin: 12.5 g/dL (ref 12.0–15.0)
MCH: 27.1 pg (ref 26.0–34.0)
MCHC: 30.8 g/dL (ref 30.0–36.0)
MCV: 87.9 fL (ref 80.0–100.0)
NRBC: 0 % (ref 0.0–0.2)
Platelets: 290 10*3/uL (ref 150–400)
RBC: 4.62 MIL/uL (ref 3.87–5.11)
RDW: 18.3 % — AB (ref 11.5–15.5)
WBC: 17.9 10*3/uL — ABNORMAL HIGH (ref 4.0–10.5)

## 2018-08-11 LAB — PROTIME-INR
INR: 3.72
Prothrombin Time: 36.3 seconds — ABNORMAL HIGH (ref 11.4–15.2)

## 2018-08-11 LAB — GLUCOSE, CAPILLARY: Glucose-Capillary: 78 mg/dL (ref 70–99)

## 2018-08-11 MED ORDER — CARVEDILOL 3.125 MG PO TABS
3.1250 mg | ORAL_TABLET | Freq: Two times a day (BID) | ORAL | Status: DC
  Filled 2018-08-11: qty 1

## 2018-08-11 MED ORDER — IPRATROPIUM-ALBUTEROL 0.5-2.5 (3) MG/3ML IN SOLN: 3.0000 mL | Freq: Four times a day (QID) | RESPIRATORY_TRACT | Status: DC | PRN

## 2018-08-11 MED ORDER — POTASSIUM CHLORIDE CRYS ER 20 MEQ PO TBCR
40.0000 meq | EXTENDED_RELEASE_TABLET | Freq: Once | ORAL | Status: AC
  Administered 2018-08-11: 17:00:00 40 meq via ORAL
  Filled 2018-08-11: qty 2

## 2018-08-11 MED ORDER — IPRATROPIUM-ALBUTEROL 0.5-2.5 (3) MG/3ML IN SOLN
3.0000 mL | Freq: Three times a day (TID) | RESPIRATORY_TRACT | Status: DC
  Administered 2018-08-11 – 2018-08-12 (×5): 3 mL via RESPIRATORY_TRACT
  Filled 2018-08-11 (×5): qty 3

## 2018-08-11 MED ORDER — VITAMIN K1 10 MG/ML IJ SOLN
5.0000 mg | Freq: Once | INTRAMUSCULAR | Status: AC
  Administered 2018-08-11: 22:00:00 5 mg via SUBCUTANEOUS
  Filled 2018-08-11: qty 0.5

## 2018-08-11 NOTE — Plan of Care (Signed)
  Problem: Education: Goal: Knowledge of General Education information will improve Description: Including pain rating scale, medication(s)/side effects and non-pharmacologic comfort measures Outcome: Progressing   Problem: Clinical Measurements: Goal: Ability to maintain clinical measurements within normal limits will improve Outcome: Progressing Goal: Will remain free from infection Outcome: Progressing Goal: Diagnostic test results will improve Outcome: Progressing Goal: Respiratory complications will improve Outcome: Progressing Goal: Cardiovascular complication will be avoided Outcome: Progressing   Problem: Coping: Goal: Level of anxiety will decrease Outcome: Progressing   Problem: Elimination: Goal: Will not experience complications related to bowel motility Outcome: Progressing Goal: Will not experience complications related to urinary retention Outcome: Progressing   Problem: Pain Managment: Goal: General experience of comfort will improve Outcome: Progressing   Problem: Safety: Goal: Ability to remain free from injury will improve Outcome: Progressing   

## 2018-08-11 NOTE — Progress Notes (Signed)
Please note patient is currently followed by outpatient Palliative at [facility]. CSW Ruthe Mannan made aware. Dayna Barker RN, BSN, [facility], hospital Liaison [phone]

## 2018-08-11 NOTE — Progress Notes (Signed)
[employer] [facility] - [place] [facility]   PATIENT NAME: Mikayla [name]    MR#:  [id number]  DATE OF BIRTH:  November 18, 1940  SUBJECTIVE:  CHIEF COMPLAINT:   Chief Complaint  Patient presents with  . Shortness of Breath   Her SOB is better now, still on OXygen as baseline.  No Abd pain. Noted to have high Bilirubin and pt have no complains, so she is upset that we are keeping her NPO.  REVIEW OF SYSTEMS:  CONSTITUTIONAL: No fever, fatigue or weakness.  EYES: No blurred or double vision.  EARS, NOSE, AND THROAT: No tinnitus or ear pain.  RESPIRATORY: No cough, shortness of breath, wheezing or hemoptysis.  CARDIOVASCULAR: No chest pain, orthopnea, edema.  GASTROINTESTINAL: No nausea, vomiting, diarrhea or abdominal pain.  GENITOURINARY: No dysuria, hematuria.  ENDOCRINE: No polyuria, nocturia,  HEMATOLOGY: No anemia, easy bruising or bleeding SKIN: No rash or lesion. MUSCULOSKELETAL: No joint pain or arthritis.   NEUROLOGIC: No tingling, numbness, weakness.  PSYCHIATRY: No anxiety or depression.   ROS  DRUG ALLERGIES:   Allergies  Allergen Reactions  . Levaquin [Levofloxacin]   . Other Nausea And Vomiting  . Sulfa Antibiotics Hives  . Oxycodone Anxiety and Other (See Comments)    VITALS:  Blood pressure (!) 129/50, pulse (!) 59, temperature (!) 97.3 F (36.3 C), temperature source Oral, resp. rate 19, height 5\' 4"  (1.626 m), weight 88.7 kg, SpO2 100 %.  PHYSICAL EXAMINATION:  GENERAL:  77 y.o.-year-old patient lying in the bed with no acute distress.  EYES: Pupils equal, round, reactive to light and accommodation. No scleral icterus. Extraocular muscles intact.  HEENT: Head atraumatic, normocephalic. Oropharynx and nasopharynx clear.  NECK:  Supple, no jugular venous distention. No thyroid enlargement, no tenderness.  LUNGS: Normal breath sounds bilaterally, no wheezing, rales,rhonchi or crepitation. No use of accessory muscles of respiration.   CARDIOVASCULAR: S1, S2 normal. No murmurs, rubs, or gallops.  ABDOMEN: Soft, nontender, nondistended. Bowel sounds present. No organomegaly or mass.  EXTREMITIES: No pedal edema, cyanosis, or clubbing.  NEUROLOGIC: Cranial nerves II through XII are intact. Muscle strength 5/5 in all extremities. Sensation intact. Gait not checked.  PSYCHIATRIC: The patient is alert and oriented x 3.  SKIN: No obvious rash, lesion, or ulcer.   Physical Exam LABORATORY PANEL:   CBC Recent Labs  Lab 08/11/18 0707  WBC 17.9*  HGB 12.5  HCT 40.6  PLT 290   ------------------------------------------------------------------------------------------------------------------  Chemistries  Recent Labs  Lab 08/11/18 0707  NA 139  K 3.4*  CL 92*  CO2 34*  GLUCOSE 99  BUN 17  CREATININE 0.71  CALCIUM 9.0  AST 137*  ALT 121*  ALKPHOS 417*  BILITOT 7.7*   ------------------------------------------------------------------------------------------------------------------  Cardiac Enzymes No results for input(s): TROPONINI in the last 168 hours. ------------------------------------------------------------------------------------------------------------------  RADIOLOGY:  US Abdomen Limited Ruq  Result Date: 08/09/2018 CLINICAL DATA:  Elevated bilirubin EXAM: ULTRASOUND ABDOMEN LIMITED RIGHT UPPER QUADRANT COMPARISON:  CT from 11/20/2016 FINDINGS: Gallbladder: Gallbladder is partially distended with multiple calcified gallstones within. No wall thickening or pericholecystic fluid is noted. Negative sonographic Murphy's sign is noted. Common bile duct: Diameter: 11 mm, there are suggested echogenicities in the distal common bile duct. Liver: No focal mass lesion is noted. Mild increased echogenicity is noted consistent with fatty infiltration. Additionally some intrahepatic ductal dilatation is noted. Portal vein is patent on color Doppler imaging with normal direction of blood flow towards the liver.  IMPRESSION: Cholelithiasis with findings suggestive of distal common bile  duct stone with intrahepatic biliary ductal dilatation. Nonemergent workup with MRCP or ERCP may be helpful. Electronically Signed   By: Alcide Clever M.D.   On: 08/09/2018 21:52    ASSESSMENT AND PLAN:   Active Problems:   Pressure injury of skin   Acute respiratory failure (HCC)   Calculus of bile duct without cholangitis or cholecystitis with obstruction  1.  Sepsis  - secondary to pneumonia.   IV antibiotics, oxygen and nebulizers.  Continue to monitor clinically closely.  On baseline oxygen now. 2.  Acute on chronic respiratory failure, secondary to pneumonia.  See treatment as above under #1. 3.  Hyperbilirubinemia, secondary to CBD obstruction due to calculus.    GI is consulted for further evaluation and treatment.  As she is high risk for anesthesia and INR is high- GI suggest to get IR guided Percutaneous transhepatic cholangiogram. 4.  Supratherapeutic INR.  No active bleeding.   hold Coumadin for now and continue to monitor INR daily.  Vitamin K given to revers effect faster. 5.  Paroxysmal atrial fibrillation, currently with sinus tachycardia.  Continue Coreg.  Will hold Coumadin due to elevated INR.   As patient was requesting to start diet, started on liquid diet today.  We will keep n.p.o. after midnight and call IR tomorrow.   All the records are reviewed and case discussed with Care Management/Social Workerr. Management plans discussed with the patient, family and they are in agreement.  CODE STATUS: DNR  TOTAL TIME TAKING CARE OF THIS PATIENT: 40 minutes.   Patient's 2 daughters were present in the room during my visit.  POSSIBLE D/C IN 2-3 DAYS, DEPENDING ON CLINICAL CONDITION.   Altamese Dilling M.D on 08/11/2018   Between 7am to 6pm - Pager - [phone]  After 6pm go to [email or web address] - password [email or web address]  [facility]  Office  [phone]  CC: Primary  care physician; System, Pcp Not In  Note: This dictation was prepared with Dragon dictation along with smaller phrase technology. Any transcriptional errors that result from this process are unintentional.

## 2018-08-11 NOTE — Progress Notes (Signed)
Mikayla Minium, MD [facility]   [address] [place], [place] [address] Phone: [phone] Fax : [phone]   Subjective: This patient is a high risk for any surgical intervention and surgery has signed off.  The patient has common bile duct stones with elevated liver enzymes and an elevated INR.  The patient will likely need general anesthesia for an ERCP due to her body habitus and past medical history.  I have discussed this with the daughter who does not want the patient to undergo any procedures with general anesthesia.   Objective: Vital signs in last 24 hours: Vitals:   08/10/18 2003 08/11/18 0438 08/11/18 0803 08/11/18 1354  BP:  (!) 144/73  (!) 132/58  Pulse:  66  67  Resp:  19  20  Temp:  97.8 F (36.6 C)  98.8 F (37.1 C)  TempSrc:  Oral    SpO2: 99% 99% 91% 100%  Weight:      Height:       Weight change:   Intake/Output Summary (Last 24 hours) at 08/11/2018 1427 Last data filed at 08/11/2018 1300 Gross per 24 hour  Intake 2068.9 ml  Output 950 ml  Net 1118.9 ml     Exam: Jaundice, no apparent distress, alert and orientated    Lab Results: @LABTEST2 @ Micro Results: Recent Results (from the past 240 hour(s))  Culture, blood (Routine x 2)     Status: None (Preliminary result)   Collection Time: 08/09/18  7:27 PM  Result Value Ref Range Status   Specimen Description BLOOD BLOOD RIGHT FOREARM  Final   Special Requests   Final    BOTTLES DRAWN AEROBIC AND ANAEROBIC Blood Culture results may not be optimal due to an excessive volume of blood received in culture bottles   Culture   Final    NO GROWTH 2 DAYS Performed at [facility], [address] [place], [place] [address]    Report Status PENDING  Incomplete  Culture, blood (Routine x 2)     Status: None (Preliminary result)   Collection Time: 08/09/18  7:28 PM  Result Value Ref Range Status   Specimen Description BLOOD BLOOD LEFT FOREARM  Final   Special Requests   Final    BOTTLES  DRAWN AEROBIC AND ANAEROBIC Blood Culture results may not be optimal due to an excessive volume of blood received in culture bottles   Culture   Final    NO GROWTH 2 DAYS Performed at [facility], [address], [place], [place] [address]    Report Status PENDING  Incomplete  MRSA PCR Screening     Status: None   Collection Time: 08/10/18  1:24 AM  Result Value Ref Range Status   MRSA by PCR NEGATIVE NEGATIVE Final    Comment:        The GeneXpert MRSA Assay (FDA approved for NASAL specimens only), is one component of a comprehensive MRSA colonization surveillance program. It is not intended to diagnose MRSA infection nor to guide or monitor treatment for MRSA infections. Performed at [facility], [address], [place], [place] [address]    Studies/Results: Dg Chest 2 View  Result Date: 08/09/2018 CLINICAL DATA:  Respiratory distress. Recent treatment for pneumonia. Wheezing. EXAM: CHEST - 2 VIEW COMPARISON:  10/24/2016 FINDINGS: The patient is rotated towards the right. Heart size is normal. There is chronic patchy density at both lung bases right more than left. This could represent scarring, but a degree of atelectasis or basilar  pneumonia is not excluded. There is no new abnormality seen however. There are chronic displaced rib fractures on the right. IMPRESSION: Chronic markings at the lung bases that could be scarring. However, a degree of active atelectasis or pneumonia is not excluded. Chronic displaced rib fractures on the right. Electronically Signed   By: Paulina Fusi M.D.   On: 08/09/2018 20:29   US Abdomen Limited Ruq  Result Date: 08/09/2018 CLINICAL DATA:  Elevated bilirubin EXAM: ULTRASOUND ABDOMEN LIMITED RIGHT UPPER QUADRANT COMPARISON:  CT from 11/20/2016 FINDINGS: Gallbladder: Gallbladder is partially distended with multiple calcified gallstones within. No wall thickening or pericholecystic fluid is noted. Negative sonographic Murphy's sign  is noted. Common bile duct: Diameter: 11 mm, there are suggested echogenicities in the distal common bile duct. Liver: No focal mass lesion is noted. Mild increased echogenicity is noted consistent with fatty infiltration. Additionally some intrahepatic ductal dilatation is noted. Portal vein is patent on color Doppler imaging with normal direction of blood flow towards the liver. IMPRESSION: Cholelithiasis with findings suggestive of distal common bile duct stone with intrahepatic biliary ductal dilatation. Nonemergent workup with MRCP or ERCP may be helpful. Electronically Signed   By: Alcide Clever M.D.   On: 08/09/2018 21:52   Medications: I have reviewed the patient's current medications. Scheduled Meds: . amiodarone  200 mg Oral Daily  . carbidopa-levodopa  1 tablet Oral TID  . carvedilol  6.25 mg Oral BID WC  . docusate sodium  100 mg Oral BID  . furosemide  20 mg Oral Daily  . gabapentin  300 mg Oral TID  . ipratropium-albuterol  3 mL Nebulization TID  . Melatonin  5 mg Oral QHS  . multivitamin with minerals  1 tablet Oral Daily  . pantoprazole  40 mg Oral BID  . PARoxetine  30 mg Oral Daily  . potassium chloride SA  20 mEq Oral Daily  . traZODone  25 mg Oral QHS   Continuous Infusions: . sodium chloride 75 mL/hr at 08/11/18 0447  . ceFEPime (MAXIPIME) IV Stopped (08/11/18 1030)   PRN Meds:.acetaminophen **OR** acetaminophen, bisacodyl, HYDROcodone-acetaminophen, ipratropium-albuterol, ondansetron **OR** ondansetron (ZOFRAN) IV, sodium chloride, traZODone   Assessment: Active Problems:   Pressure injury of skin   Acute respiratory failure (HCC)   Calculus of bile duct without cholangitis or cholecystitis with obstruction    Plan: This patient is a high risk for any general anesthesia and would need general anesthesia for an ERCP and stone extraction.  The patient also has been explained the risks of pancreatitis bleeding and perforation.  The patient and her daughter have  asked if there are any other alternatives to general anesthesia and I have offered them her percutaneous transhepatic drain.  They would like to pursue this avenue for drainage of her dilated common bile duct.  This will need to be done when the patient's INR has improved or after the administration of fresh frozen plasma and vitamin K.  I would recommend setting this up with interventional radiology. I will sign off.  Please call if any further GI concerns or questions.  We would like to thank you for the opportunity to participate in the care of Mikayla [name].     LOS: 1 day   Mikayla [name] 08/11/2018, 2:27 PM

## 2018-08-12 LAB — COMPREHENSIVE METABOLIC PANEL
ALT: 59 U/L — AB (ref 0–44)
AST: 69 U/L — AB (ref 15–41)
Albumin: 2.3 g/dL — ABNORMAL LOW (ref 3.5–5.0)
Alkaline Phosphatase: 340 U/L — ABNORMAL HIGH (ref 38–126)
Anion gap: 9 (ref 5–15)
BUN: 16 mg/dL (ref 8–23)
CO2: 31 mmol/L (ref 22–32)
Calcium: 8.4 mg/dL — ABNORMAL LOW (ref 8.9–10.3)
Chloride: 99 mmol/L (ref 98–111)
Creatinine, Ser: 0.74 mg/dL (ref 0.44–1.00)
Glucose, Bld: 99 mg/dL (ref 70–99)
POTASSIUM: 3.4 mmol/L — AB (ref 3.5–5.1)
Sodium: 139 mmol/L (ref 135–145)
TOTAL PROTEIN: 6.2 g/dL — AB (ref 6.5–8.1)
Total Bilirubin: 3.9 mg/dL — ABNORMAL HIGH (ref 0.3–1.2)

## 2018-08-12 LAB — URINALYSIS, COMPLETE (UACMP) WITH MICROSCOPIC
BILIRUBIN URINE: NEGATIVE
Glucose, UA: NEGATIVE mg/dL
Ketones, ur: NEGATIVE mg/dL
LEUKOCYTES UA: NEGATIVE
NITRITE: NEGATIVE
PH: 6 (ref 5.0–8.0)
Protein, ur: NEGATIVE mg/dL
Specific Gravity, Urine: 1.006 (ref 1.005–1.030)

## 2018-08-12 LAB — CBC
HEMATOCRIT: 37 % (ref 36.0–46.0)
Hemoglobin: 11.2 g/dL — ABNORMAL LOW (ref 12.0–15.0)
MCH: 26.8 pg (ref 26.0–34.0)
MCHC: 30.3 g/dL (ref 30.0–36.0)
MCV: 88.5 fL (ref 80.0–100.0)
NRBC: 0 % (ref 0.0–0.2)
Platelets: 258 10*3/uL (ref 150–400)
RBC: 4.18 MIL/uL (ref 3.87–5.11)
RDW: 18.2 % — AB (ref 11.5–15.5)
WBC: 9.5 10*3/uL (ref 4.0–10.5)

## 2018-08-12 LAB — GLUCOSE, CAPILLARY: Glucose-Capillary: 91 mg/dL (ref 70–99)

## 2018-08-12 LAB — PROTIME-INR
INR: 2.95
Prothrombin Time: 30.3 seconds — ABNORMAL HIGH (ref 11.4–15.2)

## 2018-08-12 MED ORDER — POTASSIUM CHLORIDE CRYS ER 20 MEQ PO TBCR
40.0000 meq | EXTENDED_RELEASE_TABLET | Freq: Once | ORAL | Status: AC
  Administered 2018-08-12: 40 meq via ORAL
  Filled 2018-08-12: qty 2

## 2018-08-12 MED ORDER — INFLUENZA VAC SPLIT HIGH-DOSE 0.5 ML IM SUSY
0.5000 mL | PREFILLED_SYRINGE | Freq: Once | INTRAMUSCULAR | Status: AC
  Administered 2018-08-12: 17:00:00 0.5 mL via INTRAMUSCULAR
  Filled 2018-08-12 (×2): qty 0.5

## 2018-08-12 MED ORDER — AMOXICILLIN-POT CLAVULANATE 875-125 MG PO TABS: 1.0000 | ORAL_TABLET | Freq: Two times a day (BID) | ORAL | 0 refills | Status: AC

## 2018-08-12 MED ORDER — WARFARIN SODIUM 3 MG PO TABS: 4.0000 mg | ORAL_TABLET | Freq: Every day | ORAL | Status: DC

## 2018-08-12 MED ORDER — INFLUENZA VAC SPLIT HIGH-DOSE 0.5 ML IM SUSY: 0.5000 mL | PREFILLED_SYRINGE | INTRAMUSCULAR | Status: DC

## 2018-08-12 MED ORDER — ORAL CARE MOUTH RINSE
15.0000 mL | Freq: Two times a day (BID) | OROMUCOSAL | Status: DC
  Administered 2018-08-12: 15 mL via OROMUCOSAL

## 2018-08-12 MED ORDER — CARVEDILOL 3.125 MG PO TABS: 3.1250 mg | ORAL_TABLET | Freq: Two times a day (BID) | ORAL | 0 refills | Status: DC

## 2018-08-12 NOTE — Discharge Instructions (Signed)
Need to check INR and Liver enzymes in 3-4 days, Changed dose of Coumadin.

## 2018-08-12 NOTE — Progress Notes (Signed)
Pt is being discharged to [facility].  Report called to [facility], LPN.  AVS and Rx placed in discharge packet for facility.  Awaiting EMS.

## 2018-08-12 NOTE — Clinical Social Work Note (Signed)
Patient is medically ready for discharge today. CSW notified patient and daughters at bedside of discharge today. Patient and daughters are in agreement. Patient will return to [facility] with Palliative care. CSW notified [name], admissions coordinator at [facility] of discharge today. Patient will be transported by EMS. RN to call report and call for transport.   Ruthe Mannan MSW, [address] [phone]

## 2018-08-12 NOTE — Care Management Important Message (Signed)
Copy of signed IM left with patient in room.  

## 2018-08-12 NOTE — Discharge Summary (Signed)
[facility] - [place] at [facility]   PATIENT NAME: Mikayla [name]    MR#:  [id number]  DATE OF BIRTH:  13-Nov-1940  DATE OF ADMISSION:  08/09/2018 ADMITTING PHYSICIAN: Cammy Copa, MD  DATE OF DISCHARGE: 08/12/2018   PRIMARY CARE PHYSICIAN: System, Pcp Not In    ADMISSION DIAGNOSIS:  Elevated bilirubin [R17] Calculus of bile duct without cholecystitis with obstruction [K80.51] Acute respiratory failure with hypoxia and hypercapnia (HCC) [J96.01, J96.02]  DISCHARGE DIAGNOSIS:  Active Problems:   Pressure injury of skin   Acute respiratory failure (HCC)   Calculus of bile duct without cholangitis or cholecystitis with obstruction   SECONDARY DIAGNOSIS:   Past Medical History:  Diagnosis Date  . Atrial fibrillation (HCC)   . Empyema [facility])    in [facility] Aug 2017- stayed in hospital on for 2 months.  . Hypertension     HOSPITAL COURSE:   1.Sepsis  - secondary to pneumonia.  IV antibiotics,oxygen and nebulizers.Continue to monitor clinically closely.  On baseline oxygen now.  Give augmentin for next 4 days. 2.Acute on chronic respiratory failure,secondary to pneumonia.See treatment as above under #1. 3.Hyperbilirubinemia,secondary to CBD obstruction due to calculus. GI is consulted for further evaluation and treatment.  As she is high risk for anesthesia and INR is high- GI suggest to get IR guided Percutaneous transhepatic cholangiogram.  Now her LFT and BIlirubin coming down, so she passed the stone.  She does nto have any symptoms and high risk for procedures anyways. So pt and her 2 daughters agreed , NO procedure now as no urgent need. I explained, in future, she may have stone and obstruction again. For now- she is ready for discharge as tolerating diet. Recheck LFT in 3-4 days. 4.Supratherapeutic INR.No active bleeding. hold Coumadin for now and continue to monitor INR daily.  Vitamin K given to revers effect  faster.  INR 2.9 on discharge- decrease to 3 mg daily and check in next 3-4 days. 5.Paroxysmal atrial fibrillation,currently with sinus tachycardia.Continue Coreg.Will hold Coumadin due to elevated INR.  resumed with lower dose on discharge.    DISCHARGE CONDITIONS:   Stable.  CONSULTS OBTAINED:  Treatment Team:  Ancil Linsey, MD  DRUG ALLERGIES:   Allergies  Allergen Reactions  . Levaquin [Levofloxacin]   . Other Nausea And Vomiting  . Sulfa Antibiotics Hives  . Oxycodone Anxiety and Other (See Comments)    DISCHARGE MEDICATIONS:   Allergies as of 08/12/2018      Reactions   Levaquin [levofloxacin]    Other Nausea And Vomiting   Sulfa Antibiotics Hives   Oxycodone Anxiety, Other (See Comments)      Medication List    STOP taking these medications   acetaminophen 500 MG tablet Commonly known as:  TYLENOL   celecoxib 50 MG capsule Commonly known as:  CELEBREX     TAKE these medications   albuterol (2.5 MG/3ML) 0.083% nebulizer solution Commonly known as:  PROVENTIL Take 2.5 mg by nebulization every 8 (eight) hours.   amiodarone 200 MG tablet Commonly known as:  PACERONE Take 1 tablet (200 mg total) by mouth daily.   amoxicillin-clavulanate 875-125 MG tablet Commonly known as:  AUGMENTIN Take 1 tablet by mouth every 12 (twelve) hours for 4 days.   carbidopa-levodopa 10-100 MG tablet Commonly known as:  SINEMET IR Take 1 tablet by mouth 3 (three) times daily.   carvedilol 3.125 MG tablet Commonly known as:  COREG Take 1 tablet (3.125 mg total) by mouth 2 (two)  times daily with a meal. What changed:    medication strength  how much to take   ferrous sulfate 324 (65 Fe) MG Tbec Take 1 tablet by mouth daily.   furosemide 20 MG tablet Commonly known as:  LASIX Take 1 tablet (20 mg total) by mouth daily. What changed:  how much to take   gabapentin 300 MG capsule Commonly known as:  NEURONTIN Take 300 mg by mouth 3 (three) times  daily.   ipratropium-albuterol 0.5-2.5 (3) MG/3ML Soln Commonly known as:  DUONEB Take 3 mLs by nebulization 3 (three) times daily.   Melatonin 5 MG Tabs Take 5 mg by mouth at bedtime.   multivitamin with minerals Tabs tablet Take 1 tablet by mouth daily.   omeprazole 20 MG tablet Commonly known as:  PRILOSEC OTC Take 20 mg by mouth 2 (two) times daily.   PARoxetine 30 MG tablet Commonly known as:  PAXIL Take 30 mg by mouth daily.   potassium chloride SA 20 MEQ tablet Commonly known as:  K-DUR,KLOR-CON Take 20 mEq by mouth daily.   senna 8.6 MG Tabs tablet Commonly known as:  SENOKOT Take 1 tablet by mouth daily.   sodium chloride 0.65 % Soln nasal spray Commonly known as:  OCEAN Place 1 spray into both nostrils every 2 (two) hours as needed for congestion (allergies).   traZODone 50 MG tablet Commonly known as:  DESYREL Take 25 mg by mouth at bedtime.   warfarin 3 MG tablet Commonly known as:  COUMADIN Take 1.5 tablets (4.5 mg total) by mouth daily. What changed:    medication strength  how much to take        DISCHARGE INSTRUCTIONS:    Palliative care to follow at facility.  If you experience worsening of your admission symptoms, develop shortness of breath, life threatening emergency, suicidal or homicidal thoughts you must seek medical attention immediately by calling 911 or calling your MD immediately  if symptoms less severe.  You Must read complete instructions/literature along with all the possible adverse reactions/side effects for all the Medicines you take and that have been prescribed to you. Take any new Medicines after you have completely understood and accept all the possible adverse reactions/side effects.   Please note  You were cared for by a hospitalist during your hospital stay. If you have any questions about your discharge medications or the care you received while you were in the hospital after you are discharged, you can call the  unit and asked to speak with the hospitalist on call if the hospitalist that took care of you is not available. Once you are discharged, your primary care physician will handle any further medical issues. Please note that NO REFILLS for any discharge medications will be authorized once you are discharged, as it is imperative that you return to your primary care physician (or establish a relationship with a primary care physician if you do not have one) for your aftercare needs so that they can reassess your need for medications and monitor your lab values.    Today   CHIEF COMPLAINT:   Chief Complaint  Patient presents with  . Shortness of Breath    HISTORY OF PRESENT ILLNESS:  Mikayla [name]  is a 77 y.o. female with a known history of atrial fibrillation, hypertension and other comorbidities. Patient is a poor historian and currently in acute respiratory distress, unable to provide reliable history.  Most of the information was taken from reviewing the medical chart and  from discussion with emergency room physician. She was brought to emergency room due to respiratory distress, tachycardia and fever.  She is only requiring supplemental oxygenation in addition to her baseline 1 to 2 L oxygen per nasal cannula.  No reports of nausea, vomiting, chest pain, abdominal pain, bleeding. Per patient's husband, she was recently started on antibiotics for presumed pneumonia. Blood test done emergency room are notable for elevated INR at 6, bilirubin at 4.5, WBC 16.3.  Lactic acid is normal. Chest x-ray shows chronic markings at the lung bases that could be scarring. However, a degree of active atelectasis or pneumonia is not excluded. Abdominal ultrasound shows cholelithiasis with findings suggestive of distal common bile duct stone with intrahepatic biliary ductal dilatation. Patient is admitted for further evaluation and treatment.   VITAL SIGNS:  Blood pressure (!) 132/96, pulse 63, temperature  97.7 F (36.5 C), temperature source Oral, resp. rate 16, height 5\' 4"  (1.626 m), weight 88.7 kg, SpO2 100 %.  I/O:    Intake/Output Summary (Last 24 hours) at 08/12/2018 1511 Last data filed at 08/12/2018 1149 Gross per 24 hour  Intake 1848.41 ml  Output 400 ml  Net 1448.41 ml    PHYSICAL EXAMINATION:   GENERAL:  77 y.o.-year-old patient lying in the bed with no acute distress.  EYES: Pupils equal, round, reactive to light and accommodation. No scleral icterus. Extraocular muscles intact.  HEENT: Head atraumatic, normocephalic. Oropharynx and nasopharynx clear.  NECK:  Supple, no jugular venous distention. No thyroid enlargement, no tenderness.  LUNGS: Normal breath sounds bilaterally, no wheezing, rales,rhonchi or crepitation. No use of accessory muscles of respiration.  CARDIOVASCULAR: S1, S2 normal. No murmurs, rubs, or gallops.  ABDOMEN: Soft, nontender, nondistended. Bowel sounds present. No organomegaly or mass.  EXTREMITIES: No pedal edema, cyanosis, or clubbing.  NEUROLOGIC: Cranial nerves II through XII are intact. Muscle strength 3/5 in all extremities. Sensation intact. Gait not checked.  PSYCHIATRIC: The patient is alert and oriented x 3.  SKIN: No obvious rash, lesion, or ulcer.   DATA REVIEW:   CBC Recent Labs  Lab 08/12/18 0815  WBC 9.5  HGB 11.2*  HCT 37.0  PLT 258    Chemistries  Recent Labs  Lab 08/12/18 0815  NA 139  K 3.4*  CL 99  CO2 31  GLUCOSE 99  BUN 16  CREATININE 0.74  CALCIUM 8.4*  AST 69*  ALT 59*  ALKPHOS 340*  BILITOT 3.9*    Cardiac Enzymes No results for input(s): TROPONINI in the last 168 hours.  Microbiology Results  Results for orders placed or performed during the hospital encounter of 08/09/18  Culture, blood (Routine x 2)     Status: None (Preliminary result)   Collection Time: 08/09/18  7:27 PM  Result Value Ref Range Status   Specimen Description BLOOD BLOOD RIGHT FOREARM  Final   Special Requests   Final     BOTTLES DRAWN AEROBIC AND ANAEROBIC Blood Culture results may not be optimal due to an excessive volume of blood received in culture bottles   Culture   Final    NO GROWTH 3 DAYS Performed at [facility], [address] [place], [place] [address]    Report Status PENDING  Incomplete  Culture, blood (Routine x 2)     Status: None (Preliminary result)   Collection Time: 08/09/18  7:28 PM  Result Value Ref Range Status   Specimen Description BLOOD BLOOD LEFT FOREARM  Final   Special Requests   Final  BOTTLES DRAWN AEROBIC AND ANAEROBIC Blood Culture results may not be optimal due to an excessive volume of blood received in culture bottles   Culture   Final    NO GROWTH 3 DAYS Performed at [facility], [address] [place], [place] [address]    Report Status PENDING  Incomplete  MRSA PCR Screening     Status: None   Collection Time: 08/10/18  1:24 AM  Result Value Ref Range Status   MRSA by PCR NEGATIVE NEGATIVE Final    Comment:        The GeneXpert MRSA Assay (FDA approved for NASAL specimens only), is one component of a comprehensive MRSA colonization surveillance program. It is not intended to diagnose MRSA infection nor to guide or monitor treatment for MRSA infections. Performed at [facility], [address] [place], [place] [address]     RADIOLOGY:  No results found.  EKG:   Orders placed or performed during the hospital encounter of 08/09/18  . EKG 12-Lead  . EKG 12-Lead      Management plans discussed with the patient, family and they are in agreement.  CODE STATUS: DNR    Code Status Orders  (From admission, onward)         Start     Ordered   08/10/18 0111  Do not attempt resuscitation (DNR)  Continuous    Question Answer Comment  In the event of cardiac or respiratory ARREST Do not call a "code blue"   In the event of cardiac or respiratory ARREST Do not perform Intubation, CPR, defibrillation or ACLS   In  the event of cardiac or respiratory ARREST Use medication by any route, position, wound care, and other measures to relive pain and suffering. May use oxygen, suction and manual treatment of airway obstruction as needed for comfort.      08/10/18 0110        Code Status History    Date Active Date Inactive Code Status Order ID Comments User Context   11/20/2016 0904 11/21/2016 1854 DNR [id number]  Arnaldo Natal, MD Inpatient   10/24/2016 [phone] 10/30/2016 2126 DNR [id number]  Shaune Pollack, MD Inpatient   09/28/2016 1555 10/02/2016 1523 DNR [id number]  Altamese Dilling, MD Inpatient    Advance Directive Documentation     Most Recent Value  Type of Advance Directive  Out of facility DNR (pink MOST or yellow form)  Pre-existing out of facility DNR order (yellow form or pink MOST form)  -  "MOST" Form in Place?  -      TOTAL TIME TAKING CARE OF THIS PATIENT: 35 minutes.    Altamese Dilling M.D on 08/12/2018 at 3:11 PM  Between 7am to 6pm - Pager - [phone]  After 6pm go to [email or web address] - password [email or web address]  [facility]  Office  [phone]  CC: Primary care physician; System, Pcp Not In   Note: This dictation was prepared with Dragon dictation along with smaller phrase technology. Any transcriptional errors that result from this process are unintentional.

## 2018-08-12 NOTE — Progress Notes (Addendum)
Pharmacy Antibiotic Note  Mikayla [name] is a 77 y.o. female admitted on 08/09/2018 with pneumonia.  Pharmacy has been consulted for cefepime dosing.  Plan: Cefepime 2 grams q 12 hours continued  Height: 5\' 4"  (162.6 cm) Weight: 195 lb 8.8 oz (88.7 kg) IBW/kg (Calculated) : 54.7  Temp (24hrs), Avg:98 F (36.7 C), Min:97.3 F (36.3 C), Max:98.8 F (37.1 C)  Recent Labs  Lab 08/09/18 1925 08/09/18 2121 08/10/18 0410 08/11/18 0707 08/12/18 0815  WBC 16.3*  --  16.6* 17.9* 9.5  CREATININE 0.99  --  0.87 0.71 0.74  LATICACIDVEN 1.1 0.8  --   --   --     Estimated Creatinine Clearance: 63.5 mL/min (by C-G formula based on SCr of 0.74 mg/dL).    Allergies  Allergen Reactions  . Levaquin [Levofloxacin]   . Other Nausea And Vomiting  . Sulfa Antibiotics Hives  . Oxycodone Anxiety and Other (See Comments)    Antimicrobials this admission: Vancomycin 10/15>>10/16 (MRSA PCR neg) cefepime 10/15 >>    Dose adjustments this admission: No dosing adj warranted at this time  Microbiology results: 10/15 BCx: no growthx3days/pending 10/16 MRSA PCR:  neg      10/15 CXR: pneumonia not excluded Thank you for allowing pharmacy to be a part of this patient's care.  Albina Billet, PharmD Clinical Pharmacist 08/12/2018 10:29 AM

## 2018-08-14 LAB — CULTURE, BLOOD (ROUTINE X 2)
Culture: NO GROWTH
Culture: NO GROWTH

## 2018-08-19 ENCOUNTER — Encounter: Payer: Self-pay | Admitting: Intensive Care

## 2018-08-19 ENCOUNTER — Emergency Department: Payer: Medicare Other

## 2018-08-19 ENCOUNTER — Other Ambulatory Visit: Payer: Self-pay

## 2018-08-19 ENCOUNTER — Inpatient Hospital Stay
Admission: EM | Admit: 2018-08-19 | Discharge: 2018-08-25 | DRG: 872 | Disposition: A | Discharge status: Skilled Nursing Facility | Payer: Medicare Other | Attending: Internal Medicine | Admitting: Internal Medicine

## 2018-08-19 DIAGNOSIS — Z87891 Personal history of nicotine dependence: Secondary | ICD-10-CM | POA: Diagnosis not present

## 2018-08-19 DIAGNOSIS — K8020 Calculus of gallbladder without cholecystitis without obstruction: Secondary | ICD-10-CM | POA: Diagnosis present

## 2018-08-19 DIAGNOSIS — Z800 Family history of malignant neoplasm of digestive organs: Secondary | ICD-10-CM | POA: Diagnosis not present

## 2018-08-19 DIAGNOSIS — A4159 Other Gram-negative sepsis: Secondary | ICD-10-CM | POA: Diagnosis present

## 2018-08-19 DIAGNOSIS — K8309 Other cholangitis: Secondary | ICD-10-CM

## 2018-08-19 DIAGNOSIS — Z95828 Presence of other vascular implants and grafts: Secondary | ICD-10-CM

## 2018-08-19 DIAGNOSIS — Z825 Family history of asthma and other chronic lower respiratory diseases: Secondary | ICD-10-CM | POA: Diagnosis not present

## 2018-08-19 DIAGNOSIS — I69354 Hemiplegia and hemiparesis following cerebral infarction affecting left non-dominant side: Secondary | ICD-10-CM | POA: Diagnosis not present

## 2018-08-19 DIAGNOSIS — I480 Paroxysmal atrial fibrillation: Secondary | ICD-10-CM | POA: Diagnosis present

## 2018-08-19 DIAGNOSIS — K219 Gastro-esophageal reflux disease without esophagitis: Secondary | ICD-10-CM | POA: Diagnosis present

## 2018-08-19 DIAGNOSIS — I509 Heart failure, unspecified: Secondary | ICD-10-CM | POA: Diagnosis present

## 2018-08-19 DIAGNOSIS — A419 Sepsis, unspecified organism: Secondary | ICD-10-CM | POA: Diagnosis present

## 2018-08-19 DIAGNOSIS — Z885 Allergy status to narcotic agent status: Secondary | ICD-10-CM | POA: Diagnosis not present

## 2018-08-19 DIAGNOSIS — K8033 Calculus of bile duct with acute cholangitis with obstruction: Secondary | ICD-10-CM

## 2018-08-19 DIAGNOSIS — R7989 Other specified abnormal findings of blood chemistry: Secondary | ICD-10-CM

## 2018-08-19 DIAGNOSIS — M109 Gout, unspecified: Secondary | ICD-10-CM | POA: Diagnosis present

## 2018-08-19 DIAGNOSIS — R6520 Severe sepsis without septic shock: Secondary | ICD-10-CM | POA: Diagnosis present

## 2018-08-19 DIAGNOSIS — F419 Anxiety disorder, unspecified: Secondary | ICD-10-CM | POA: Diagnosis present

## 2018-08-19 DIAGNOSIS — Z7901 Long term (current) use of anticoagulants: Secondary | ICD-10-CM

## 2018-08-19 DIAGNOSIS — Z881 Allergy status to other antibiotic agents status: Secondary | ICD-10-CM

## 2018-08-19 DIAGNOSIS — Z7401 Bed confinement status: Secondary | ICD-10-CM

## 2018-08-19 DIAGNOSIS — R945 Abnormal results of liver function studies: Secondary | ICD-10-CM | POA: Diagnosis present

## 2018-08-19 DIAGNOSIS — Z882 Allergy status to sulfonamides status: Secondary | ICD-10-CM | POA: Diagnosis not present

## 2018-08-19 DIAGNOSIS — B961 Klebsiella pneumoniae [K. pneumoniae] as the cause of diseases classified elsewhere: Secondary | ICD-10-CM | POA: Diagnosis present

## 2018-08-19 DIAGNOSIS — I110 Hypertensive heart disease with heart failure: Secondary | ICD-10-CM | POA: Diagnosis present

## 2018-08-19 DIAGNOSIS — R062 Wheezing: Secondary | ICD-10-CM

## 2018-08-19 DIAGNOSIS — Z6833 Body mass index (BMI) 33.0-33.9, adult: Secondary | ICD-10-CM

## 2018-08-19 DIAGNOSIS — Z79899 Other long term (current) drug therapy: Secondary | ICD-10-CM

## 2018-08-19 DIAGNOSIS — Z888 Allergy status to other drugs, medicaments and biological substances status: Secondary | ICD-10-CM

## 2018-08-19 DIAGNOSIS — R740 Nonspecific elevation of levels of transaminase and lactic acid dehydrogenase [LDH]: Secondary | ICD-10-CM | POA: Diagnosis present

## 2018-08-19 DIAGNOSIS — Z8249 Family history of ischemic heart disease and other diseases of the circulatory system: Secondary | ICD-10-CM

## 2018-08-19 DIAGNOSIS — R4182 Altered mental status, unspecified: Secondary | ICD-10-CM | POA: Diagnosis present

## 2018-08-19 DIAGNOSIS — Z66 Do not resuscitate: Secondary | ICD-10-CM | POA: Diagnosis present

## 2018-08-19 DIAGNOSIS — Z801 Family history of malignant neoplasm of trachea, bronchus and lung: Secondary | ICD-10-CM

## 2018-08-19 DIAGNOSIS — J440 Chronic obstructive pulmonary disease with acute lower respiratory infection: Secondary | ICD-10-CM | POA: Diagnosis present

## 2018-08-19 LAB — URINALYSIS, ROUTINE W REFLEX MICROSCOPIC
Glucose, UA: NEGATIVE mg/dL
Ketones, ur: NEGATIVE mg/dL
Leukocytes, UA: NEGATIVE
Nitrite: POSITIVE — AB
PH: 7 (ref 5.0–8.0)
Protein, ur: 30 mg/dL — AB
SPECIFIC GRAVITY, URINE: 1.014 (ref 1.005–1.030)

## 2018-08-19 LAB — COMPREHENSIVE METABOLIC PANEL
ALBUMIN: 3 g/dL — AB (ref 3.5–5.0)
ALK PHOS: 573 U/L — AB (ref 38–126)
ALT: 61 U/L — ABNORMAL HIGH (ref 0–44)
AST: 163 U/L — AB (ref 15–41)
Anion gap: 12 (ref 5–15)
BILIRUBIN TOTAL: 4.6 mg/dL — AB (ref 0.3–1.2)
BUN: 13 mg/dL (ref 8–23)
CALCIUM: 8.7 mg/dL — AB (ref 8.9–10.3)
CO2: 35 mmol/L — ABNORMAL HIGH (ref 22–32)
Chloride: 93 mmol/L — ABNORMAL LOW (ref 98–111)
Creatinine, Ser: 0.77 mg/dL (ref 0.44–1.00)
GFR calc Af Amer: >60 mL/min (ref >60)
GLUCOSE: 149 mg/dL — AB (ref 70–99)
POTASSIUM: 4.5 mmol/L (ref 3.5–5.1)
Sodium: 140 mmol/L (ref 135–145)
TOTAL PROTEIN: 7.4 g/dL (ref 6.5–8.1)

## 2018-08-19 LAB — BLOOD GAS, ARTERIAL
Acid-Base Excess: 11.1 mmol/L — ABNORMAL HIGH (ref 0.0–2.0)
BICARBONATE: 37.4 mmol/L — AB (ref 20.0–28.0)
FIO2: 0.28
O2 Saturation: 93.7 %
PATIENT TEMPERATURE: 37
PCO2 ART: 55 mmHg — AB (ref 32.0–48.0)
PO2 ART: 67 mmHg — AB (ref 83.0–108.0)
pH, Arterial: 7.44 (ref 7.350–7.450)

## 2018-08-19 LAB — CBC WITH DIFFERENTIAL/PLATELET
Abs Immature Granulocytes: 0.28 10*3/uL — ABNORMAL HIGH (ref 0.00–0.07)
BASOS PCT: 1 %
Basophils Absolute: 0.1 10*3/uL (ref 0.0–0.1)
EOS ABS: 0.1 10*3/uL (ref 0.0–0.5)
EOS PCT: 0 %
HCT: 42.5 % (ref 36.0–46.0)
Hemoglobin: 12.7 g/dL (ref 12.0–15.0)
Immature Granulocytes: 1 %
Lymphocytes Relative: 3 %
Lymphs Abs: 0.8 10*3/uL (ref 0.7–4.0)
MCH: 27.5 pg (ref 26.0–34.0)
MCHC: 29.9 g/dL — ABNORMAL LOW (ref 30.0–36.0)
MCV: 92.2 fL (ref 80.0–100.0)
MONO ABS: 0.9 10*3/uL (ref 0.1–1.0)
MONOS PCT: 4 %
NEUTROS ABS: 21.4 10*3/uL — AB (ref 1.7–7.7)
Neutrophils Relative %: 91 %
PLATELETS: 364 10*3/uL (ref 150–400)
RBC: 4.61 MIL/uL (ref 3.87–5.11)
RDW: 16.5 % — ABNORMAL HIGH (ref 11.5–15.5)
WBC: 23.6 10*3/uL — ABNORMAL HIGH (ref 4.0–10.5)
nRBC: 0 % (ref 0.0–0.2)

## 2018-08-19 LAB — INFLUENZA PANEL BY PCR (TYPE A & B)
Influenza A By PCR: NEGATIVE
Influenza B By PCR: NEGATIVE

## 2018-08-19 LAB — PROTIME-INR
INR: 1.85
Prothrombin Time: 21.1 seconds — ABNORMAL HIGH (ref 11.4–15.2)

## 2018-08-19 LAB — LACTIC ACID, PLASMA: Lactic Acid, Venous: 1.6 mmol/L (ref 0.5–1.9)

## 2018-08-19 LAB — TROPONIN I: Troponin I: <0.03 ng/mL (ref <0.03)

## 2018-08-19 LAB — BRAIN NATRIURETIC PEPTIDE: B NATRIURETIC PEPTIDE 5: 121 pg/mL — AB (ref 0.0–100.0)

## 2018-08-19 MED ORDER — VANCOMYCIN HCL IN DEXTROSE 1-5 GM/200ML-% IV SOLN
1000.0000 mg | Freq: Two times a day (BID) | INTRAVENOUS | Status: DC
  Administered 2018-08-20: 1000 mg via INTRAVENOUS
  Filled 2018-08-19: qty 200

## 2018-08-19 MED ORDER — METHYLPREDNISOLONE SODIUM SUCC 125 MG IJ SOLR
125.0000 mg | Freq: Once | INTRAMUSCULAR | Status: AC
  Administered 2018-08-19: 125 mg via INTRAVENOUS
  Filled 2018-08-19: qty 2

## 2018-08-19 MED ORDER — FUROSEMIDE 20 MG PO TABS
20.0000 mg | ORAL_TABLET | Freq: Every day | ORAL | Status: DC
  Administered 2018-08-20 – 2018-08-25 (×6): 20 mg via ORAL
  Filled 2018-08-19 (×6): qty 1

## 2018-08-19 MED ORDER — ONDANSETRON HCL 4 MG PO TABS: 4.0000 mg | ORAL_TABLET | Freq: Four times a day (QID) | ORAL | Status: DC | PRN

## 2018-08-19 MED ORDER — ACETAMINOPHEN 325 MG PO TABS
650.0000 mg | ORAL_TABLET | Freq: Four times a day (QID) | ORAL | Status: DC | PRN
  Administered 2018-08-19 – 2018-08-24 (×7): 650 mg via ORAL
  Filled 2018-08-19 (×8): qty 2

## 2018-08-19 MED ORDER — VANCOMYCIN HCL IN DEXTROSE 1-5 GM/200ML-% IV SOLN
1000.0000 mg | Freq: Once | INTRAVENOUS | Status: AC
  Administered 2018-08-19: 1000 mg via INTRAVENOUS
  Filled 2018-08-19: qty 200

## 2018-08-19 MED ORDER — IPRATROPIUM-ALBUTEROL 0.5-2.5 (3) MG/3ML IN SOLN
9.0000 mL | Freq: Once | RESPIRATORY_TRACT | Status: AC
  Administered 2018-08-19: 9 mL via RESPIRATORY_TRACT
  Filled 2018-08-19: qty 9

## 2018-08-19 MED ORDER — OMEPRAZOLE MAGNESIUM 20 MG PO TBEC: 20.0000 mg | DELAYED_RELEASE_TABLET | Freq: Two times a day (BID) | ORAL | Status: DC

## 2018-08-19 MED ORDER — METRONIDAZOLE IN NACL 5-0.79 MG/ML-% IV SOLN
500.0000 mg | Freq: Three times a day (TID) | INTRAVENOUS | Status: DC
  Administered 2018-08-19 (×2): 500 mg via INTRAVENOUS
  Filled 2018-08-19 (×3): qty 100

## 2018-08-19 MED ORDER — TRAZODONE HCL 50 MG PO TABS
25.0000 mg | ORAL_TABLET | Freq: Every day | ORAL | Status: DC
  Administered 2018-08-20 – 2018-08-24 (×5): 25 mg via ORAL
  Filled 2018-08-19 (×6): qty 1

## 2018-08-19 MED ORDER — IPRATROPIUM-ALBUTEROL 0.5-2.5 (3) MG/3ML IN SOLN
3.0000 mL | Freq: Three times a day (TID) | RESPIRATORY_TRACT | Status: DC
  Administered 2018-08-20 – 2018-08-21 (×4): 3 mL via RESPIRATORY_TRACT
  Filled 2018-08-19 (×4): qty 3

## 2018-08-19 MED ORDER — FERROUS SULFATE 325 (65 FE) MG PO TABS
325.0000 mg | ORAL_TABLET | Freq: Every day | ORAL | Status: DC
  Administered 2018-08-20 – 2018-08-25 (×6): 325 mg via ORAL
  Filled 2018-08-19 (×6): qty 1

## 2018-08-19 MED ORDER — AMIODARONE HCL 200 MG PO TABS
200.0000 mg | ORAL_TABLET | Freq: Every day | ORAL | Status: DC
  Administered 2018-08-20 – 2018-08-25 (×6): 200 mg via ORAL
  Filled 2018-08-19 (×6): qty 1

## 2018-08-19 MED ORDER — ONDANSETRON HCL 4 MG/2ML IJ SOLN: 4.0000 mg | Freq: Four times a day (QID) | INTRAMUSCULAR | Status: DC | PRN

## 2018-08-19 MED ORDER — ACETAMINOPHEN 650 MG RE SUPP: 650.0000 mg | Freq: Four times a day (QID) | RECTAL | Status: DC | PRN

## 2018-08-19 MED ORDER — POTASSIUM CHLORIDE CRYS ER 20 MEQ PO TBCR
20.0000 meq | EXTENDED_RELEASE_TABLET | Freq: Every day | ORAL | Status: DC
  Administered 2018-08-20 – 2018-08-25 (×6): 20 meq via ORAL
  Filled 2018-08-19 (×6): qty 1

## 2018-08-19 MED ORDER — PANTOPRAZOLE SODIUM 40 MG PO TBEC
40.0000 mg | DELAYED_RELEASE_TABLET | Freq: Two times a day (BID) | ORAL | Status: DC
  Administered 2018-08-20 – 2018-08-25 (×11): 40 mg via ORAL
  Filled 2018-08-19 (×12): qty 1

## 2018-08-19 MED ORDER — ADULT MULTIVITAMIN W/MINERALS CH
1.0000 | ORAL_TABLET | Freq: Every day | ORAL | Status: DC
  Administered 2018-08-20 – 2018-08-25 (×6): 1 via ORAL
  Filled 2018-08-19 (×6): qty 1

## 2018-08-19 MED ORDER — MELATONIN 5 MG PO TABS
5.0000 mg | ORAL_TABLET | Freq: Every day | ORAL | Status: DC
  Administered 2018-08-20 – 2018-08-24 (×5): 5 mg via ORAL
  Filled 2018-08-19 (×7): qty 1

## 2018-08-19 MED ORDER — WARFARIN - PHYSICIAN DOSING INPATIENT
Freq: Every day | Status: DC
  Administered 2018-08-20: 18:00:00

## 2018-08-19 MED ORDER — PAROXETINE HCL 30 MG PO TABS
30.0000 mg | ORAL_TABLET | Freq: Every day | ORAL | Status: DC
  Administered 2018-08-20 – 2018-08-25 (×6): 30 mg via ORAL
  Filled 2018-08-19 (×7): qty 1

## 2018-08-19 MED ORDER — SENNA 8.6 MG PO TABS
1.0000 | ORAL_TABLET | Freq: Every day | ORAL | Status: DC
  Administered 2018-08-20 – 2018-08-25 (×6): 8.6 mg via ORAL
  Filled 2018-08-19 (×6): qty 1

## 2018-08-19 MED ORDER — WARFARIN SODIUM 5 MG PO TABS
5.0000 mg | ORAL_TABLET | Freq: Every day | ORAL | Status: DC
  Administered 2018-08-20: 5 mg via ORAL
  Filled 2018-08-19 (×3): qty 1

## 2018-08-19 MED ORDER — SODIUM CHLORIDE 0.9 % IV SOLN
2.0000 g | Freq: Once | INTRAVENOUS | Status: AC
  Administered 2018-08-19: 2 g via INTRAVENOUS
  Filled 2018-08-19: qty 2

## 2018-08-19 MED ORDER — SODIUM CHLORIDE 0.9 % IV SOLN
2.0000 g | Freq: Two times a day (BID) | INTRAVENOUS | Status: AC
  Administered 2018-08-20 – 2018-08-23 (×8): 2 g via INTRAVENOUS
  Filled 2018-08-19 (×9): qty 2

## 2018-08-19 NOTE — ED Notes (Signed)
Judeth Cornfield, NT transporting att

## 2018-08-19 NOTE — ED Triage Notes (Signed)
Patient arrived from [facility] for [facility]. Per staff patients baseline is A&O x4. PAtient is only alert to self upon arrival. Febrile upon arrival 102.0 ax. Follows some commands. Answers to name

## 2018-08-19 NOTE — ED Notes (Addendum)
Family (daughter) leaving bedside to move car around  [place], [place] called of rpt arriving in approx 10 mins  Charge called for transport assist

## 2018-08-19 NOTE — ED Provider Notes (Signed)
[facility] Emergency Department Provider Note  ___________________________________________   First MD Initiated Contact with Patient 08/19/18 1500     (approximate)  I have reviewed the triage vital signs and the nursing notes.   HISTORY  Chief Complaint Altered Mental Status   HPI Mikayla [name] is a 77 y.o. female with recent admission for pneumonia who is presenting with altered mental status as well as fever that started this morning.  Patient's daughter is at the bedside and said the patient normally knows her location of the year.  However, the patient is only oriented to herself and her birthdate at this time.  Patient denying any pain.  Also found to have hypoxia to the 80s on her baseline 2 L of nasal cannula oxygen.  Patient denies any burning with urination.  However, the daughter stated the patient has had UTIs in the past associated with confusion similar to today.  Also during recent admission the patient was found to have gallstones with an elevated bilirubin.  A percutaneous drain was planned at that time but the bilirubin began to decrease and the plan was aborted.   Past Medical History:  Diagnosis Date  . Atrial fibrillation (HCC)   . Empyema [facility])    in [facility] Aug 2017- stayed in hospital on for 2 months.  . Hypertension     Patient Active Problem List   Diagnosis Date Noted  . Acute respiratory failure (HCC) 08/10/2018  . Calculus of bile duct without cholangitis or cholecystitis with obstruction   . Cellulitis 11/20/2016  . Pressure injury of skin 09/30/2016  . Palliative care encounter   . Goals of care, counseling/discussion   . Encounter for hospice care discussion     Past Surgical History:  Procedure Laterality Date  . PEG TUBE PLACEMENT      Prior to Admission medications   Medication Sig Start Date End Date Taking? Authorizing Provider  amiodarone (PACERONE) 200 MG tablet Take 1 tablet (200 mg total) by mouth  daily. 11/22/16  Yes Shaune Pollack, MD  ferrous sulfate 324 (65 Fe) MG TBEC Take 1 tablet by mouth daily.   Yes [provider]  furosemide (LASIX) 20 MG tablet Take 1 tablet (20 mg total) by mouth daily. 10/30/16  Yes Sudini, Wardell Heath, MD  ipratropium-albuterol (DUONEB) 0.5-2.5 (3) MG/3ML SOLN Take 3 mLs by nebulization 3 (three) times daily.   Yes [provider]  Melatonin 5 MG TABS Take 5 mg by mouth at bedtime.   Yes [provider]  Multiple Vitamin (MULTIVITAMIN WITH MINERALS) TABS tablet Take 1 tablet by mouth daily.   Yes [provider]  omeprazole (PRILOSEC OTC) 20 MG tablet Take 20 mg by mouth 2 (two) times daily.   Yes [provider]  PARoxetine (PAXIL) 30 MG tablet Take 30 mg by mouth daily.    Yes [provider]  potassium chloride SA (K-DUR,KLOR-CON) 20 MEQ tablet Take 20 mEq by mouth daily.   Yes [provider]  senna (SENOKOT) 8.6 MG TABS tablet Take 1 tablet by mouth daily.   Yes [provider]  traZODone (DESYREL) 50 MG tablet Take 25 mg by mouth at bedtime.   Yes [provider]  warfarin (COUMADIN) 5 MG tablet Take 5 mg by mouth daily at 8 pm.   Yes [provider]  carvedilol (COREG) 3.125 MG tablet Take 1 tablet (3.125 mg total) by mouth 2 (two) times daily with a meal. Patient not taking: Reported on  08/19/2018 08/12/18   Altamese Dilling, MD  warfarin (COUMADIN) 3 MG tablet Take 1.5 tablets (4.5 mg total) by mouth daily. Patient not taking: Reported on 08/19/2018 08/12/18   Altamese Dilling, MD    Allergies Levaquin [levofloxacin]; Other; Sulfa antibiotics; and Oxycodone  Family History  Problem Relation Age of Onset  . Hypertension Mother     Social History Social History   Tobacco Use  . Smoking status: Former Smoker    Types: Cigarettes  . Smokeless tobacco: Former Neurosurgeon  . Tobacco comment: quit in 2001. smoked for 30 years  Substance Use Topics  . Alcohol  use: No  . Drug use: No    Review of Systems  Constitutional: No fever/chills Eyes: No visual changes. ENT: No sore throat. Cardiovascular: Denies chest pain. Respiratory: Denies shortness of breath. Gastrointestinal: No abdominal pain.  No nausea, no vomiting.  No diarrhea.  No constipation. Genitourinary: Negative for dysuria. Musculoskeletal: Negative for back pain. Skin: Negative for rash. Neurological: Negative for headaches, focal weakness or numbness.   ____________________________________________   PHYSICAL EXAM:  VITAL SIGNS: ED Triage Vitals  Enc Vitals Group     BP 08/19/18 1451 135/85     Pulse Rate 08/19/18 1451 87     Resp 08/19/18 1451 (!) 23     Temp 08/19/18 1451 (!) 102 F (38.9 C)     Temp Source 08/19/18 1451 Axillary     SpO2 08/19/18 1451 98 %     Weight 08/19/18 1452 195 lb 8.8 oz (88.7 kg)     Height 08/19/18 1452 5\' 4"  (1.626 m)     Head Circumference --      Peak Flow --      Pain Score 08/19/18 1452 0     Pain Loc --      Pain Edu? --      Excl. in GC? --     Constitutional: Alert and oriented to name and birthdate only. Eyes: Conjunctivae are normal.  Head: Atraumatic. Nose: No congestion/rhinnorhea. Mouth/Throat: Mucous membranes are moist.  Neck: No stridor.   Cardiovascular: Normal rate, regular rhythm. Grossly normal heart sounds.   Respiratory: Slight tachypnea with decreased respirations throughout mild wheezing throughout.  No respiratory distress.   Gastrointestinal: Soft and nontender. No distention.  Musculoskeletal: No lower extremity tenderness nor edema.  No joint effusions. Neurologic:  Normal speech and language. No gross focal neurologic deficits are appreciated. Skin:  Skin is warm, dry and intact. No rash noted. Psychiatric: Mood and affect are normal. Speech and behavior are normal.  ____________________________________________   LABS (all labs ordered are listed, but only abnormal results are  displayed)  Labs Reviewed  COMPREHENSIVE METABOLIC PANEL - Abnormal; Notable for the following components:      Result Value   Chloride 93 (*)    CO2 35 (*)    Glucose, Bld 149 (*)    Calcium 8.7 (*)    Albumin 3.0 (*)    AST 163 (*)    ALT 61 (*)    Alkaline Phosphatase 573 (*)    Total Bilirubin 4.6 (*)    All other components within normal limits  CBC WITH DIFFERENTIAL/PLATELET - Abnormal; Notable for the following components:   WBC 23.6 (*)    MCHC 29.9 (*)    RDW 16.5 (*)    Neutro Abs 21.4 (*)    Abs Immature Granulocytes 0.28 (*)    All other components within normal limits  BLOOD GAS, VENOUS - Abnormal; Notable for the  following components:   pCO2, Ven 68 (*)    Bicarbonate 44.1 (*)    Acid-Base Excess 16.3 (*)    All other components within normal limits  CULTURE, BLOOD (ROUTINE X 2)  CULTURE, BLOOD (ROUTINE X 2)  LACTIC ACID, PLASMA  TROPONIN I  URINALYSIS, ROUTINE W REFLEX MICROSCOPIC  LACTIC ACID, PLASMA  BRAIN NATRIURETIC PEPTIDE  INFLUENZA PANEL BY PCR (TYPE A & B)   ____________________________________________  EKG  ED ECG REPORT I, Arelia Longest, the attending physician, personally viewed and interpreted this ECG.   Date: 08/19/2018  EKG Time: 1450  Rate: 87  Rhythm: normal sinus rhythm  Axis: Normal  Intervals:none  ST&T Change: No ST segment elevation or depression.  No abnormal T wave inversion.  ____________________________________________  RADIOLOGY  No acute finding on the chest x-ray.  Gallbladder ultrasound without acute cholecystitis.  Likely gallbladder wall thickening secondary to contraction. ____________________________________________   PROCEDURES  Procedure(s) performed:   .Critical Care Performed by: Myrna Blazer, MD Authorized by: Myrna Blazer, MD   Critical care provider statement:    Critical care time (minutes):  35   Critical care time was exclusive of:  Separately billable procedures  and treating other patients   Critical care was necessary to treat or prevent imminent or life-threatening deterioration of the following conditions:  Sepsis   Critical care was time spent personally by me on the following activities:  Development of treatment plan with patient or surrogate, discussions with consultants, evaluation of patient's response to treatment, examination of patient, obtaining history from patient or surrogate, ordering and performing treatments and interventions, ordering and review of laboratory studies, ordering and review of radiographic studies, pulse oximetry, re-evaluation of patient's condition and review of old charts    Critical Care performed:    ____________________________________________   INITIAL IMPRESSION / ASSESSMENT AND PLAN / ED COURSE  Pertinent labs & imaging results that were available during my care of the patient were reviewed by me and considered in my medical decision making (see chart for details).  Differential diagnosis includes, but is not limited to, alcohol, illicit or prescription medications, or other toxic ingestion; intracranial pathology such as stroke or intracerebral hemorrhage; fever or infectious causes including sepsis; hypoxemia and/or hypercarbia; uremia; trauma; endocrine related disorders such as diabetes, hypoglycemia, and thyroid-related diseases; hypertensive encephalopathy; etc. As part of my medical decision making, I reviewed the following data within the electronic MEDICAL RECORD NUMBER Notes from prior ED visits  ----------------------------------------- 5:40 PM on 08/19/2018 -----------------------------------------  Nursing attempted a straight catheterization but was unable to secondary to the patient's difficult anatomy.  Patient will be given empiric antibiotics.  Pending flu swab at this time as well.  To be admitted to the hospital.  Possible urinary source.  Unlikely be meningitis.  Patient awake and alert  although confused.  No meningismus.  Family patient aware of need for admission to the hospital.  Signed out to Dr. Caryn Bee ____________________________________________   FINAL CLINICAL IMPRESSION(S) / ED DIAGNOSES  Fever.  Sepsis.  Altered mental status.  NEW MEDICATIONS STARTED DURING THIS VISIT:  New Prescriptions   No medications on file     Note:  This document was prepared using Dragon voice recognition software and may include unintentional dictation errors.     Myrna Blazer, MD 08/19/18 [phone]

## 2018-08-19 NOTE — Progress Notes (Signed)
CODE SEPSIS - PHARMACY COMMUNICATION  **Broad Spectrum Antibiotics should be administered within 1 hour of Sepsis diagnosis**  Time Code Sepsis Called/Page Received: 15:35  Antibiotics Ordered: Cefepime, Vancomycin, Metronidazole  Time of 1st antibiotic administration: Cefepime given at 16:16  Additional action taken by pharmacy:   If necessary, Name of Provider/Nurse Contacted:     Foye Deer ,PharmD Clinical Pharmacist  08/19/2018  4:28 PM

## 2018-08-19 NOTE — Progress Notes (Signed)
Patient was more lethargic in the ER, ABG obtained which shows no evidence of hypercapnia.  Urinalysis is back and is consistent with a UTI which is likely the source of patient's sepsis.  Continue antibiotics as mentioned on the H&P.   

## 2018-08-19 NOTE — Progress Notes (Signed)
Pharmacy Antibiotic Note  Mikayla [name] is a 77 y.o. female admitted on 08/19/2018 with sepsis.  Pharmacy has been consulted for Vancomycin and Cefepime dosing.  Plan: Ke=0.057 T1/2=12 hr Vd=62.1 L  Will order Vancomycin 1g IV q12h. Will check a trough level prior to 5th dose.  Cefepime 2g IV q12h  Height: 5\' 4"  (162.6 cm) Weight: 195 lb 8.8 oz (88.7 kg) IBW/kg (Calculated) : 54.7  Temp (24hrs), Avg:101 F (38.3 C), Min:100 F (37.8 C), Max:102 F (38.9 C)  Recent Labs  Lab 08/19/18 1502  WBC 23.6*  CREATININE 0.77  LATICACIDVEN 1.6    Estimated Creatinine Clearance: 63.5 mL/min (by C-G formula based on SCr of 0.77 mg/dL).    Allergies  Allergen Reactions  . Levaquin [Levofloxacin]   . Other Nausea And Vomiting  . Sulfa Antibiotics Hives  . Oxycodone Anxiety and Other (See Comments)    Antimicrobials this admission: Vancomycin 10/25 >>  Cefepime 10/25 >>   Thank you for allowing pharmacy to be a part of this patient's care.  Clovia Cuff, PharmD, BCPS 08/19/2018 7:36 PM

## 2018-08-19 NOTE — H&P (Signed)
[place] [facility] [place] NAME: Mikayla [name]    MR#:  [id number]  DATE OF BIRTH:  07/10/1941  DATE OF ADMISSION:  08/19/2018  PRIMARY CARE PHYSICIAN: System, Pcp Not In   REQUESTING/REFERRING PHYSICIAN: Dr. Larae Grooms  CHIEF COMPLAINT:   Chief Complaint  Patient presents with  . Altered Mental Status    HISTORY OF PRESENT ILLNESS:  Mikayla [name]  is a 77 y.o. female with a known history of paroxysmal atrial fibrillation, COPD, CHF, hypertension, chronic anemia who presents to the hospital due to shortness of breath, fever.  Patient was recently admitted to the hospital and treated for pneumonia and discharged on antibiotics and has finished a course and returns back from a skilled nursing facility as she was noted to have a fever of 102 and was complaining of shortness of breath.  Patient in the ER was not hypoxic and her chest x-ray findings are not suggestive of any pneumonia, although she did have a fever, leukocytosis and had some tachycardia and is suspected to have sepsis but the source presently remains unclear.  On her previous hospitalization patient also had abnormal LFTs with elevated bilirubin but there was no intervention done given the fact that she was anticoagulant since she was a high surgical risk but there was no clinical evidence of cholecystitis or cholangitis or choledocholithiasis.  A possible percutaneous drain was recommended but since her LFTs improved and her bilirubin trended down she was treated supportively and discharged to a skilled nursing facility.  Her bilirubins today are only mildly elevated and her LFTs are also stable but slightly higher from her discharge but she denies any abdominal pain nausea vomiting or any other associated symptoms.  Hospitalist services were contacted for admission given her suspected sepsis.  PAST MEDICAL HISTORY:   Past Medical History:  Diagnosis Date  . Atrial fibrillation  ([place])   . Empyema [facility])    in [facility] Aug 2017- stayed in hospital on for 2 months.  . Hypertension     PAST SURGICAL HISTORY:   Past Surgical History:  Procedure Laterality Date  . PEG TUBE PLACEMENT      SOCIAL HISTORY:   Social History   Tobacco Use  . Smoking status: Former Smoker    Types: Cigarettes  . Smokeless tobacco: Former Systems developer  . Tobacco comment: quit in 2001. smoked for 30 years  Substance Use Topics  . Alcohol use: No    FAMILY HISTORY:   Family History  Problem Relation Age of Onset  . Hypertension Mother   . COPD Mother   . Lung cancer Father   . Liver cancer Sister     DRUG ALLERGIES:   Allergies  Allergen Reactions  . Levaquin [Levofloxacin]   . Other Nausea And Vomiting  . Sulfa Antibiotics Hives  . Oxycodone Anxiety and Other (See Comments)    REVIEW OF SYSTEMS:   Review of Systems  Constitutional: Positive for fever. Negative for weight loss.  HENT: Negative for congestion, nosebleeds and tinnitus.   Eyes: Negative for blurred vision, double vision and redness.  Respiratory: Positive for shortness of breath. Negative for cough and hemoptysis.   Cardiovascular: Negative for chest pain, orthopnea, leg swelling and PND.  Gastrointestinal: Negative for abdominal pain, diarrhea, melena, nausea and vomiting.  Genitourinary: Negative for dysuria, hematuria and urgency.  Musculoskeletal: Negative for falls and joint pain.  Neurological: Positive for weakness (Generalized weakness). Negative for dizziness, tingling, sensory change, focal weakness, seizures  and headaches.  Endo/Heme/Allergies: Negative for polydipsia. Does not bruise/bleed easily.  Psychiatric/Behavioral: Negative for depression and memory loss. The patient is not nervous/anxious.     MEDICATIONS AT HOME:   Prior to Admission medications   Medication Sig Start Date End Date Taking? Authorizing Provider  amiodarone (PACERONE) 200 MG tablet Take 1 tablet (200 mg total) by mouth  daily. 11/22/16  Yes Demetrios Loll, MD  ferrous sulfate 324 (65 Fe) MG TBEC Take 1 tablet by mouth daily.   Yes [provider]  furosemide (LASIX) 20 MG tablet Take 1 tablet (20 mg total) by mouth daily. 10/30/16  Yes Sudini, Alveta Heimlich, MD  ipratropium-albuterol (DUONEB) 0.5-2.5 (3) MG/3ML SOLN Take 3 mLs by nebulization 3 (three) times daily.   Yes [provider]  Melatonin 5 MG TABS Take 5 mg by mouth at bedtime.   Yes [provider]  Multiple Vitamin (MULTIVITAMIN WITH MINERALS) TABS tablet Take 1 tablet by mouth daily.   Yes [provider]  omeprazole (PRILOSEC OTC) 20 MG tablet Take 20 mg by mouth 2 (two) times daily.   Yes [provider]  PARoxetine (PAXIL) 30 MG tablet Take 30 mg by mouth daily.    Yes [provider]  potassium chloride SA (K-DUR,KLOR-CON) 20 MEQ tablet Take 20 mEq by mouth daily.   Yes [provider]  senna (SENOKOT) 8.6 MG TABS tablet Take 1 tablet by mouth daily.   Yes [provider]  traZODone (DESYREL) 50 MG tablet Take 25 mg by mouth at bedtime.   Yes [provider]  warfarin (COUMADIN) 5 MG tablet Take 5 mg by mouth daily at 8 pm.   Yes [provider]  carvedilol (COREG) 3.125 MG tablet Take 1 tablet (3.125 mg total) by mouth 2 (two) times daily with a meal. Patient not taking: Reported on 08/19/2018 08/12/18   Vaughan Basta, MD  warfarin (COUMADIN) 3 MG tablet Take 1.5 tablets (4.5 mg total) by mouth daily. Patient not taking: Reported on 08/19/2018 08/12/18   Vaughan Basta, MD      VITAL SIGNS:  Blood pressure (!) 157/71, pulse 85, temperature (!) 102 F (38.9 C), temperature source Axillary, resp. rate 18, height 5' 4" (1.626 m), weight 88.7 kg, SpO2 96 %.  PHYSICAL EXAMINATION:  Physical Exam  GENERAL:  77 y.o.-year-old obese patient lying in bed in no acute distress.  EYES: Pupils equal, round, reactive to light and accommodation. No scleral  icterus. Extraocular muscles intact.  HEENT: Head atraumatic, normocephalic. Oropharynx and nasopharynx clear. No oropharyngeal erythema, moist oral mucosa  NECK:  Supple, no jugular venous distention. No thyroid enlargement, no tenderness.  LUNGS: Normal breath sounds bilaterally, no wheezing, rales, rhonchi. No use of accessory muscles of respiration.  CARDIOVASCULAR: S1, S2 RRR. No murmurs, rubs, gallops, clicks.  ABDOMEN: Soft, nontender, nondistended. Bowel sounds present. No organomegaly or mass.  EXTREMITIES: +1-2 edema b/l, No cyanosis, or clubbing. + 2 pedal & radial pulses b/l.   NEUROLOGIC: Cranial nerves II through XII are intact. No focal Motor or sensory deficits appreciated b/l. Globally weak.   PSYCHIATRIC: The patient is alert and oriented x 3.   SKIN: No obvious rash, lesion, or ulcer.   LABORATORY PANEL:   CBC Recent Labs  Lab 08/19/18 1502  WBC 23.6*  HGB 12.7  HCT 42.5  PLT 364   ------------------------------------------------------------------------------------------------------------------  Chemistries  Recent Labs  Lab 08/19/18 1502  NA 140  K 4.5  CL 93*  CO2 35*  GLUCOSE 149*  BUN 13  CREATININE 0.77  CALCIUM 8.7*  AST 163*  ALT 61*  ALKPHOS 573*  BILITOT 4.6*   ------------------------------------------------------------------------------------------------------------------  Cardiac Enzymes Recent Labs  Lab 08/19/18 1502  TROPONINI <0.03   ------------------------------------------------------------------------------------------------------------------  RADIOLOGY:  Dg Chest 1 View  Result Date: 08/19/2018 CLINICAL DATA:  Confusion. EXAM: CHEST  1 VIEW COMPARISON:  Radiograph of August 09, 2018. FINDINGS: Stable cardiomediastinal silhouette. No pneumothorax or pleural effusion is noted. Stable right rib fractures are noted. No acute pulmonary abnormality is noted. IMPRESSION: No acute cardiopulmonary abnormality seen. Electronically  Signed   By: Marijo Conception, M.D.   On: 08/19/2018 15:52   US Abdomen Limited Ruq  Result Date: 08/19/2018 CLINICAL DATA:  Elevated bilirubin EXAM: ULTRASOUND ABDOMEN LIMITED RIGHT UPPER QUADRANT COMPARISON:  Ultrasound 08/09/2018 FINDINGS: Gallbladder: Suspect that the gallbladder is contracted and filled with shadowing stone. Wall thickness is slightly increased at 3.7 mm. Common bile duct: Diameter: 5.3 mm Liver: Poorly visualized. Slight increased hepatic echogenicity. Portal vein is patent on color Doppler imaging with normal direction of blood flow towards the liver. IMPRESSION: 1. Suspect contracted gallbladder filled with shadowing stones. Slight increased wall thickness could be secondary to contraction. Negative for sonographic Murphy. 2. The visible portion of the bile duct is nonenlarged at 5.3 mm 3. Poorly evaluated liver due to body habitus and immobility. Electronically Signed   By: Donavan Foil M.D.   On: 08/19/2018 17:21     IMPRESSION AND PLAN:   77 year old female with past medical history of paroxysmal atrial fibrillation, COPD, CHF, recent admission for pneumonia who presents to the hospital due to fever, lethargy, shortness of breath.  1.  Sepsis-patient meets criteria given her fever, leukocytosis, tachycardia. - Source of the sepsis remains unclear presently.  Patient presented with shortness of breath but her chest x-ray is negative for acute pneumonia.  Patient does have abnormal LFTs but her abdominal ultrasound shows no evidence of cholecystitis. - I will empirically treat the patient with IV vancomycin, cefepime, follow cultures.  Urinalysis is still pending.  2.  Shortness of breath- chronic secondary to underlying COPD.  Patient is actually not hypoxic, chest x-ray shows no evidence of pneumonia.  Continue O2 supplementation.  3.  Abnormal LFTs- the patient's alk phos, AST and ALT are all slightly more elevated compared to her discharge labs a week ago.  Patient  acutely denies any abdominal pain nausea or vomiting. -Her right upper quadrant ultrasound shows no evidence of cholelithiasis or acute cholecystitis. - For now we will continue supportive care and empiric IV antibiotics, follow LFTs and if not improving would consider getting a gastroenterology consult.  4.  Paroxysmal atrial fibrillation-rate controlled.  Continue amiodarone -Continue Coumadin.  5.  GERD-continue Protonix.  6.  History of anxiety-continue Paxil.  7. COPD - no acute exacerbation - cont. Duonebs.     All the records are reviewed and case discussed with ED provider. Management plans discussed with the patient, family and they are in agreement.  CODE STATUS: DNR  TOTAL TIME TAKING CARE OF THIS PATIENT: 45 minutes.    Henreitta Leber M.D on 08/19/2018 at 5:40 PM  Between 7am to 6pm - Pager - [phone]  After 6pm go to [email or web address] - password [email or web address] [facility]  Office  [phone]  CC: Primary care physician; System, Pcp Not In

## 2018-08-20 ENCOUNTER — Inpatient Hospital Stay: Payer: Medicare Other

## 2018-08-20 LAB — BLOOD CULTURE ID PANEL (REFLEXED)
ACINETOBACTER BAUMANNII: NOT DETECTED
CANDIDA ALBICANS: NOT DETECTED
CANDIDA GLABRATA: NOT DETECTED
CANDIDA PARAPSILOSIS: NOT DETECTED
CANDIDA TROPICALIS: NOT DETECTED
Candida krusei: NOT DETECTED
Carbapenem resistance: NOT DETECTED
ENTEROBACTER CLOACAE COMPLEX: NOT DETECTED
ESCHERICHIA COLI: NOT DETECTED
Enterobacteriaceae species: DETECTED — AB
Enterococcus species: NOT DETECTED
HAEMOPHILUS INFLUENZAE: NOT DETECTED
KLEBSIELLA PNEUMONIAE: DETECTED — AB
Klebsiella oxytoca: NOT DETECTED
Listeria monocytogenes: NOT DETECTED
Neisseria meningitidis: NOT DETECTED
PROTEUS SPECIES: NOT DETECTED
Pseudomonas aeruginosa: NOT DETECTED
SERRATIA MARCESCENS: NOT DETECTED
STAPHYLOCOCCUS SPECIES: NOT DETECTED
Staphylococcus aureus (BCID): NOT DETECTED
Streptococcus agalactiae: NOT DETECTED
Streptococcus pneumoniae: NOT DETECTED
Streptococcus pyogenes: NOT DETECTED
Streptococcus species: NOT DETECTED

## 2018-08-20 LAB — COMPREHENSIVE METABOLIC PANEL
ALT: 118 U/L — ABNORMAL HIGH (ref 0–44)
ANION GAP: 12 (ref 5–15)
AST: 101 U/L — ABNORMAL HIGH (ref 15–41)
Albumin: 2.8 g/dL — ABNORMAL LOW (ref 3.5–5.0)
Alkaline Phosphatase: 519 U/L — ABNORMAL HIGH (ref 38–126)
BILIRUBIN TOTAL: 2.8 mg/dL — AB (ref 0.3–1.2)
BUN: 18 mg/dL (ref 8–23)
CALCIUM: 8.8 mg/dL — AB (ref 8.9–10.3)
CO2: 34 mmol/L — AB (ref 22–32)
Chloride: 94 mmol/L — ABNORMAL LOW (ref 98–111)
Creatinine, Ser: 0.78 mg/dL (ref 0.44–1.00)
GFR calc Af Amer: >60 mL/min (ref >60)
GLUCOSE: 212 mg/dL — AB (ref 70–99)
Potassium: 4.1 mmol/L (ref 3.5–5.1)
Sodium: 140 mmol/L (ref 135–145)
TOTAL PROTEIN: 7.4 g/dL (ref 6.5–8.1)

## 2018-08-20 LAB — CBC
HEMATOCRIT: 38.9 % (ref 36.0–46.0)
Hemoglobin: 11.8 g/dL — ABNORMAL LOW (ref 12.0–15.0)
MCH: 27.2 pg (ref 26.0–34.0)
MCHC: 30.3 g/dL (ref 30.0–36.0)
MCV: 89.6 fL (ref 80.0–100.0)
Platelets: 323 10*3/uL (ref 150–400)
RBC: 4.34 MIL/uL (ref 3.87–5.11)
RDW: 16.3 % — AB (ref 11.5–15.5)
WBC: 16.8 10*3/uL — ABNORMAL HIGH (ref 4.0–10.5)
nRBC: 0 % (ref 0.0–0.2)

## 2018-08-20 MED ORDER — CARBIDOPA-LEVODOPA 10-100 MG PO TABS
1.0000 | ORAL_TABLET | Freq: Three times a day (TID) | ORAL | Status: DC
  Administered 2018-08-20 – 2018-08-25 (×14): 1 via ORAL
  Filled 2018-08-20 (×17): qty 1

## 2018-08-20 MED ORDER — CARVEDILOL 3.125 MG PO TABS
3.1250 mg | ORAL_TABLET | Freq: Two times a day (BID) | ORAL | Status: DC
  Administered 2018-08-20 – 2018-08-25 (×10): 3.125 mg via ORAL
  Filled 2018-08-20 (×10): qty 1

## 2018-08-20 NOTE — Clinical Social Work Note (Signed)
Clinical Social Work Assessment  Patient Details  Name: Mikayla [name] MRN: [id number] Date of Birth: 06-05-1941  Date of referral:  08/20/18               Reason for consult:  Facility Placement                Permission sought to share information with:  Chartered certified accountant granted to share information::  Yes, Verbal Permission Granted  Name::        Agency::  [facility]  Relationship::     Contact Information:     Housing/Transportation Living arrangements for the past 2 months:  [place] of Information:  Patient, Scientist, water quality, Adult Children, Facility Patient Interpreter Needed:  None Criminal Activity/Legal Involvement Pertinent to Current Situation/Hospitalization:  No - Comment as needed Significant Relationships:  Adult Children, Warehouse manager Lives with:  Facility Resident Do you feel safe going back to the place where you live?  Yes Need for family participation in patient care:  No (Coment)  Care giving concerns: CSW aware through chart review that patient is a resident of [facility].   Social Worker assessment / plan: CSW met with the patient and her son at bedside to discuss discharge planning. The patient was pleasantly confused, and her son shared that the plan is for her to return to [facility] where the patient is a long term care resident. The CSW has confirmed that the patient can return when stable. CSW will follow for discharge facilitation when the patient is medically stable.    Employment status:  Retired Nurse, adult PT Recommendations:  Not assessed at this time Information / Referral to community resources:     Patient/Family's Response to care:  The patient and her son thanked the CSW.  Patient/Family's Understanding of and Emotional Response to Diagnosis, Current Treatment, and Prognosis:  The patient is confused; however, the patient's family is in  agreement with the patient returning to [facility] when stable.  Emotional Assessment Appearance:  Appears stated age Attitude/Demeanor/Rapport:  Engaged Affect (typically observed):  Pleasant Orientation:  Oriented to Self, Oriented to Place Alcohol / Substance use:  Never Used Psych involvement (Current and /or in the community):  No (Comment)  Discharge Needs  Concerns to be addressed:  Care Coordination, Discharge Planning Concerns Readmission within the last 30 days:  Yes Current discharge risk:  Chronically ill, Physical Impairment Barriers to Discharge:  Continued Medical Work up   [employer], LCSW 08/20/2018, 4:16 PM

## 2018-08-20 NOTE — Progress Notes (Signed)
PHARMACY - PHYSICIAN COMMUNICATION CRITICAL VALUE ALERT - BLOOD CULTURE IDENTIFICATION (BCID)  Mikayla [name] is an 77 y.o. female who presented to [facility] on 08/19/2018 with a chief complaint of SOB and abd pain recently here for cholecystitis s/p perc drain  Assessment:  Tmin 96.1, WBC 23.6 >> pending 10/26, UA nitrite +, bacteria many, 3/4 GNR BCID Enterobacteriacaea Kleb pneumonia [facility] -   Name of physician (or Provider) Contacted: Barbaraann Rondo  Current antibiotics: Vanc, cefepime, flagyl  Changes to prescribed antibiotics recommended:  Recommendations accepted by provider -- d/c vanc and flagyl and continue cefepime per MD since kleb sensitivities not out yet.  Results for orders placed or performed during the hospital encounter of 08/19/18  Blood Culture ID Panel (Reflexed) (Collected: 08/19/2018  3:02 PM)  Result Value Ref Range   Enterococcus species NOT DETECTED NOT DETECTED   Listeria monocytogenes NOT DETECTED NOT DETECTED   Staphylococcus species NOT DETECTED NOT DETECTED   Staphylococcus aureus (BCID) NOT DETECTED NOT DETECTED   Streptococcus species NOT DETECTED NOT DETECTED   Streptococcus agalactiae NOT DETECTED NOT DETECTED   Streptococcus pneumoniae NOT DETECTED NOT DETECTED   Streptococcus pyogenes NOT DETECTED NOT DETECTED   Acinetobacter baumannii NOT DETECTED NOT DETECTED   Enterobacteriaceae species DETECTED (A) NOT DETECTED   Enterobacter cloacae complex NOT DETECTED NOT DETECTED   Escherichia coli NOT DETECTED NOT DETECTED   Klebsiella oxytoca NOT DETECTED NOT DETECTED   Klebsiella pneumoniae DETECTED (A) NOT DETECTED   Proteus species NOT DETECTED NOT DETECTED   Serratia marcescens NOT DETECTED NOT DETECTED   Carbapenem resistance NOT DETECTED NOT DETECTED   Haemophilus influenzae NOT DETECTED NOT DETECTED   Neisseria meningitidis NOT DETECTED NOT DETECTED   Pseudomonas aeruginosa NOT DETECTED NOT DETECTED   Candida albicans NOT DETECTED  NOT DETECTED   Candida glabrata NOT DETECTED NOT DETECTED   Candida krusei NOT DETECTED NOT DETECTED   Candida parapsilosis NOT DETECTED NOT DETECTED   Candida tropicalis NOT DETECTED NOT DETECTED   Thomasene Ripple, PharmD, BCPS Clinical Pharmacist 08/20/2018

## 2018-08-20 NOTE — Progress Notes (Signed)
Patient ID: Mikayla [name], female   DOB: [birth year], 77 y.o.   MRN: [id number]  [employer] PROGRESS NOTE  Mikayla [name] YQM:[id number] DOB: [birth year] DOA: 08/19/2018 PCP: System, Pcp Not In  HPI/Subjective: Patient does not know why she came to the hospital.  As per family she was confused at her facility and had a fever and was sleeping a lot.  Objective: Vitals:   08/20/18 0911 08/20/18 1416  BP: 137/74 (!) 128/54  Pulse: 63 67  Resp:  16  Temp: 97.8 F (36.6 C) 98 F (36.7 C)  SpO2: 99% 97%    Filed Weights   08/19/18 1452  Weight: 88.7 kg    ROS: Review of Systems  Constitutional: Negative for chills and fever.  Eyes: Negative for blurred vision.  Respiratory: Negative for cough and shortness of breath.   Cardiovascular: Negative for chest pain.  Gastrointestinal: Negative for abdominal pain, constipation, diarrhea, nausea and vomiting.  Genitourinary: Negative for dysuria.  Musculoskeletal: Negative for joint pain.  Neurological: Negative for dizziness and headaches.   Exam: Physical Exam  Constitutional: She is oriented to person, place, and time.  HENT:  Nose: No mucosal edema.  Mouth/Throat: No oropharyngeal exudate or posterior oropharyngeal edema.  Eyes: Pupils are equal, round, and reactive to light. Conjunctivae, EOM and lids are normal.  Neck: No JVD present. Carotid bruit is not present. No edema present. No thyroid mass and no thyromegaly present.  Cardiovascular: S1 normal and S2 normal. Exam reveals no gallop.  No murmur heard. Pulses:      Dorsalis pedis pulses are 2+ on the right side, and 2+ on the left side.  Respiratory: No respiratory distress. She has no wheezes. She has no rhonchi. She has no rales.  GI: Soft. Bowel sounds are normal. There is no tenderness.  Musculoskeletal:       Right ankle: She exhibits swelling.       Left ankle: She exhibits swelling.  Lymphadenopathy:    She has no cervical adenopathy.   Neurological: She is alert and oriented to person, place, and time. No cranial nerve deficit.  Skin: Skin is warm. No rash noted. Nails show no clubbing.  Psychiatric: She has a normal mood and affect.      Data Reviewed: Basic Metabolic Panel: Recent Labs  Lab 08/19/18 1502 08/20/18 0433  NA 140 140  K 4.5 4.1  CL 93* 94*  CO2 35* 34*  GLUCOSE 149* 212*  BUN 13 18  CREATININE 0.77 0.78  CALCIUM 8.7* 8.8*   Liver Function Tests: Recent Labs  Lab 08/19/18 1502 08/20/18 0433  AST 163* 101*  ALT 61* 118*  ALKPHOS 573* 519*  BILITOT 4.6* 2.8*  PROT 7.4 7.4  ALBUMIN 3.0* 2.8*   CBC: Recent Labs  Lab 08/19/18 1502 08/20/18 0433  WBC 23.6* 16.8*  NEUTROABS 21.4*  --   HGB 12.7 11.8*  HCT 42.5 38.9  MCV 92.2 89.6  PLT 364 323   Cardiac Enzymes: Recent Labs  Lab 08/19/18 1502  TROPONINI <0.03   BNP (last 3 results) Recent Labs    08/19/18 1502  BNP 121.0*      Recent Results (from the past 240 hour(s))  Blood Culture (routine x 2)     Status: None (Preliminary result)   Collection Time: 08/19/18  3:02 PM  Result Value Ref Range Status   Specimen Description BLOOD BLOOD RIGHT FOREARM  Final   Special Requests   Final    BOTTLES DRAWN AEROBIC AND  ANAEROBIC Blood Culture results may not be optimal due to an excessive volume of blood received in culture bottles   Culture  Setup Time   Final    Organism ID to follow GRAM NEGATIVE RODS IN BOTH AEROBIC AND ANAEROBIC BOTTLES CRITICAL RESULT CALLED TO, READ BACK BY AND VERIFIED WITH: DAVID BESANTI AT 4098 08/20/18.PMH Performed at [facility], [address] [place], [place] [address]    Culture GRAM NEGATIVE RODS  Final   Report Status PENDING  Incomplete  Blood Culture (routine x 2)     Status: None (Preliminary result)   Collection Time: 08/19/18  3:02 PM  Result Value Ref Range Status   Specimen Description BLOOD BLOOD LEFT FOREARM  Final   Special Requests   Final    BOTTLES DRAWN  AEROBIC AND ANAEROBIC Blood Culture results may not be optimal due to an excessive volume of blood received in culture bottles   Culture  Setup Time   Final    GRAM NEGATIVE RODS AEROBIC BOTTLE ONLY CRITICAL RESULT CALLED TO, READ BACK BY AND VERIFIED WITH: DAVID BESANTI AT 7829 08/20/18.PMH Performed at [facility], [address] [place], [place] [address]    Culture GRAM NEGATIVE RODS  Final   Report Status PENDING  Incomplete  Blood Culture ID Panel (Reflexed)     Status: Abnormal   Collection Time: 08/19/18  3:02 PM  Result Value Ref Range Status   Enterococcus species NOT DETECTED NOT DETECTED Final   Listeria monocytogenes NOT DETECTED NOT DETECTED Final   Staphylococcus species NOT DETECTED NOT DETECTED Final   Staphylococcus aureus (BCID) NOT DETECTED NOT DETECTED Final   Streptococcus species NOT DETECTED NOT DETECTED Final   Streptococcus agalactiae NOT DETECTED NOT DETECTED Final   Streptococcus pneumoniae NOT DETECTED NOT DETECTED Final   Streptococcus pyogenes NOT DETECTED NOT DETECTED Final   Acinetobacter baumannii NOT DETECTED NOT DETECTED Final   Enterobacteriaceae species DETECTED (A) NOT DETECTED Final    Comment: Enterobacteriaceae represent a large family of gram-negative bacteria, not a single organism. CRITICAL RESULT CALLED TO, READ BACK BY AND VERIFIED WITH: DAVID BESANTI AT 0865 08/20/18.PMH    Enterobacter cloacae complex NOT DETECTED NOT DETECTED Final   Escherichia coli NOT DETECTED NOT DETECTED Final   Klebsiella oxytoca NOT DETECTED NOT DETECTED Final   Klebsiella pneumoniae DETECTED (A) NOT DETECTED Final    Comment: CRITICAL RESULT CALLED TO, READ BACK BY AND VERIFIED WITH: DAVID BESANTI AT 7846 08/20/18.PMH    Proteus species NOT DETECTED NOT DETECTED Final   Serratia marcescens NOT DETECTED NOT DETECTED Final   Carbapenem resistance NOT DETECTED NOT DETECTED Final   Haemophilus influenzae NOT DETECTED NOT DETECTED Final   Neisseria  meningitidis NOT DETECTED NOT DETECTED Final   Pseudomonas aeruginosa NOT DETECTED NOT DETECTED Final   Candida albicans NOT DETECTED NOT DETECTED Final   Candida glabrata NOT DETECTED NOT DETECTED Final   Candida krusei NOT DETECTED NOT DETECTED Final   Candida parapsilosis NOT DETECTED NOT DETECTED Final   Candida tropicalis NOT DETECTED NOT DETECTED Final    Comment: Performed at [facility], [address], [place], [place] [address]     Studies: Dg Chest 1 View  Result Date: 08/19/2018 CLINICAL DATA:  Confusion. EXAM: CHEST  1 VIEW COMPARISON:  Radiograph of August 09, 2018. FINDINGS: Stable cardiomediastinal silhouette. No pneumothorax or pleural effusion is noted. Stable right rib fractures are noted. No acute pulmonary abnormality is noted. IMPRESSION: No acute cardiopulmonary abnormality seen.  Electronically Signed   By: Lupita Raider, M.D.   On: 08/19/2018 15:52   US Abdomen Limited Ruq  Result Date: 08/19/2018 CLINICAL DATA:  Elevated bilirubin EXAM: ULTRASOUND ABDOMEN LIMITED RIGHT UPPER QUADRANT COMPARISON:  Ultrasound 08/09/2018 FINDINGS: Gallbladder: Suspect that the gallbladder is contracted and filled with shadowing stone. Wall thickness is slightly increased at 3.7 mm. Common bile duct: Diameter: 5.3 mm Liver: Poorly visualized. Slight increased hepatic echogenicity. Portal vein is patent on color Doppler imaging with normal direction of blood flow towards the liver. IMPRESSION: 1. Suspect contracted gallbladder filled with shadowing stones. Slight increased wall thickness could be secondary to contraction. Negative for sonographic Murphy. 2. The visible portion of the bile duct is nonenlarged at 5.3 mm 3. Poorly evaluated liver due to body habitus and immobility. Electronically Signed   By: Jasmine Pang M.D.   On: 08/19/2018 17:21    Scheduled Meds: . amiodarone  200 mg Oral Daily  . ferrous sulfate  325 mg Oral Daily  . furosemide  20 mg Oral Daily  .  ipratropium-albuterol  3 mL Nebulization TID  . Melatonin  5 mg Oral QHS  . multivitamin with minerals  1 tablet Oral Daily  . pantoprazole  40 mg Oral BID  . PARoxetine  30 mg Oral Daily  . potassium chloride SA  20 mEq Oral Daily  . senna  1 tablet Oral Daily  . traZODone  25 mg Oral QHS  . warfarin  5 mg Oral Q2000  . Warfarin - Physician Dosing Inpatient   Does not apply q1800   Continuous Infusions: . ceFEPime (MAXIPIME) IV 2 g (08/20/18 0858)    Assessment/Plan:  1.  Sepsis with Enterobacter species.  Could be from cholangitis.  On Maxipime.  Follow-up cultures. 2.  Elevated liver function test.  Seen last time by gastroenterology and surgery.  Not a good candidate for gallbladder removal or ERCP.  Can consider a transhepatic drain.  I will be able to do this over the weekend.  Patient absolutely refused an MRI of the abdomen.  Bilirubin did trend better today. 3.  Paroxysmal atrial fibrillation on amiodarone and Coumadin 4.  GERD on Protonix 5.  Anxiety on Paxil 6.  History of stroke with left-sided weakness and bedbound. 7.  Patient is a DNR    Code Status:     Code Status Orders  (From admission, onward)         Start     Ordered   08/19/18 1806  Do not attempt resuscitation (DNR)  Continuous    Question Answer Comment  In the event of cardiac or respiratory ARREST Do not call a "code blue"   In the event of cardiac or respiratory ARREST Do not perform Intubation, CPR, defibrillation or ACLS   In the event of cardiac or respiratory ARREST Use medication by any route, position, wound care, and other measures to relive pain and suffering. May use oxygen, suction and manual treatment of airway obstruction as needed for comfort.      08/19/18 1806        Code Status History    Date Active Date Inactive Code Status Order ID Comments User Context   08/10/2018 0111 08/12/2018 2135 DNR [id number]  Cammy Copa, MD Inpatient   11/20/2016 0904 11/21/2016 1854 DNR  [id number]  Arnaldo Natal, MD Inpatient   10/24/2016 [phone] 10/30/2016 2126 DNR [id number]  Shaune Pollack, MD Inpatient   09/28/2016 1555 10/02/2016 1523 DNR [id number]  Altamese Dilling, MD  Inpatient    Advance Directive Documentation     Most Recent Value  Type of Advance Directive  Healthcare Power of Attorney  Pre-existing out of facility DNR order (yellow form or pink MOST form)  -  "MOST" Form in Place?  -     Family Communication: Daughters this at the bedside Disposition Plan: To be determined  Antibiotics:  Maxipime  Time spent: 28 minutes  Abrianna Sidman [employer]

## 2018-08-20 NOTE — Progress Notes (Signed)
Pt temp been low since midnight. MD is aware and wants warm blankets applied. Last check of pt temp rectally was 96.1. Pt has Klebsiella and Enterobacteriaceae growing in her blood culture so IV flagyl and vanc was discontinued and replaced with cefepime. Pt seems more alert as the shift progress. Pt does not seem to be in any distress. Will continue to monitor.

## 2018-08-20 NOTE — [place] FL2 (Signed)
[place] MEDICAID FL2 LEVEL OF CARE SCREENING TOOL     IDENTIFICATION  Patient Name: Mikayla [name] Birthdate: 04/03/1941 Sex: female Admission Date (Current Location): 08/19/2018  [place] and [place] Number:  Chiropodist and Address:  [facility], [address] [place], [place] [address]      Provider Number: [id number]  Attending Physician Name and Address:  Alford Highland, MD  Relative Name and Phone Number:  Susa Loffler (Daughter) [phone] or Sharlyne Cai (Daughter) [phone]    Current Level of Care: Hospital Recommended Level of Care: Skilled Nursing Facility Prior Approval Number:    Date Approved/Denied:   PASRR Number: [id number]  Discharge Plan: SNF    Current Diagnoses: Patient Active Problem List   Diagnosis Date Noted  . Sepsis (HCC) 08/19/2018  . Acute respiratory failure (HCC) 08/10/2018  . Calculus of bile duct without cholangitis or cholecystitis with obstruction   . Cellulitis 11/20/2016  . Pressure injury of skin 09/30/2016  . Palliative care encounter   . Goals of care, counseling/discussion   . Encounter for hospice care discussion     Orientation RESPIRATION BLADDER Height & Weight     Self, Place  O2(Acute o2 1L) Incontinent Weight: 195 lb 8.8 oz (88.7 kg) Height:  5\' 4"  (162.6 cm)  BEHAVIORAL SYMPTOMS/MOOD NEUROLOGICAL BOWEL NUTRITION STATUS      Continent Diet(Heart healthy)  AMBULATORY STATUS COMMUNICATION OF NEEDS Skin   Extensive Assist   PU Stage and Appropriate Care(Other (Comment)(Stage 2 left buttocks))                       Personal Care Assistance Level of Assistance  Bathing, Feeding, Dressing Bathing Assistance: Limited assistance Feeding assistance: Independent Dressing Assistance: Limited assistance     Functional Limitations Info    Sight Info: Adequate Hearing Info: Adequate Speech Info: Adequate    SPECIAL CARE FACTORS FREQUENCY                        Contractures Contractures Info: Not present    Additional Factors Info  Code Status, Allergies Code Status Info: DNR Allergies Info:  Levaquin Levofloxacin, Other, Sulfa Antibiotics, Oxycodone           Current Medications (08/20/2018):  This is the current hospital active medication list Current Facility-Administered Medications  Medication Dose Route Frequency Provider Last Rate Last Dose  . acetaminophen (TYLENOL) tablet 650 mg  650 mg Oral Q6H PRN Houston Siren, MD   650 mg at 08/19/18 1825   Or  . acetaminophen (TYLENOL) suppository 650 mg  650 mg Rectal Q6H PRN Houston Siren, MD      . amiodarone (PACERONE) tablet 200 mg  200 mg Oral Daily Houston Siren, MD   200 mg at 08/20/18 0854  . ceFEPIme (MAXIPIME) 2 g in sodium chloride 0.9 % 100 mL IVPB  2 g Intravenous Q12H Houston Siren, MD 200 mL/hr at 08/20/18 0858 2 g at 08/20/18 0858  . ferrous sulfate tablet 325 mg  325 mg Oral Daily Houston Siren, MD   325 mg at 08/20/18 0853  . furosemide (LASIX) tablet 20 mg  20 mg Oral Daily Houston Siren, MD   20 mg at 08/20/18 0853  . ipratropium-albuterol (DUONEB) 0.5-2.5 (3) MG/3ML nebulizer solution 3 mL  3 mL Nebulization TID Houston Siren, MD   3 mL at 08/20/18 0736  . Melatonin TABS 5 mg  5  mg Oral QHS Houston Siren, MD      . multivitamin with minerals tablet 1 tablet  1 tablet Oral Daily Houston Siren, MD   1 tablet at 08/20/18 0853  . ondansetron (ZOFRAN) tablet 4 mg  4 mg Oral Q6H PRN Houston Siren, MD       Or  . ondansetron (ZOFRAN) injection 4 mg  4 mg Intravenous Q6H PRN Houston Siren, MD      . pantoprazole (PROTONIX) EC tablet 40 mg  40 mg Oral BID Houston Siren, MD   40 mg at 08/20/18 0853  . PARoxetine (PAXIL) tablet 30 mg  30 mg Oral Daily Houston Siren, MD   30 mg at 08/20/18 0853  . potassium chloride SA (K-DUR,KLOR-CON) CR tablet 20 mEq  20 mEq Oral Daily Houston Siren, MD   20 mEq at 08/20/18 0854  . senna  (SENOKOT) tablet 8.6 mg  1 tablet Oral Daily Houston Siren, MD   8.6 mg at 08/20/18 0853  . traZODone (DESYREL) tablet 25 mg  25 mg Oral QHS Sainani, Vivek J, MD      . warfarin (COUMADIN) tablet 5 mg  5 mg Oral Q2000 Sainani, Rolly Pancake, MD      . Warfarin - Physician Dosing Inpatient   Does not apply [id number] Houston Siren, MD         Discharge Medications: Please see discharge summary for a list of discharge medications.  Relevant Imaging Results:  Relevant Lab Results:   Additional Information SS#[id number]  Judi Cong, LCSW

## 2018-08-21 ENCOUNTER — Inpatient Hospital Stay: Payer: Medicare Other

## 2018-08-21 ENCOUNTER — Encounter: Payer: Self-pay | Admitting: Radiology

## 2018-08-21 LAB — COMPREHENSIVE METABOLIC PANEL
ALBUMIN: 2.7 g/dL — AB (ref 3.5–5.0)
ALT: 77 U/L — AB (ref 0–44)
AST: 47 U/L — AB (ref 15–41)
Alkaline Phosphatase: 373 U/L — ABNORMAL HIGH (ref 38–126)
Anion gap: 14 (ref 5–15)
BUN: 17 mg/dL (ref 8–23)
CO2: 31 mmol/L (ref 22–32)
CREATININE: 0.77 mg/dL (ref 0.44–1.00)
Calcium: 8.7 mg/dL — ABNORMAL LOW (ref 8.9–10.3)
Chloride: 94 mmol/L — ABNORMAL LOW (ref 98–111)
GFR calc non Af Amer: >60 mL/min (ref >60)
GLUCOSE: 128 mg/dL — AB (ref 70–99)
Potassium: 3.5 mmol/L (ref 3.5–5.1)
SODIUM: 139 mmol/L (ref 135–145)
Total Bilirubin: 1.4 mg/dL — ABNORMAL HIGH (ref 0.3–1.2)
Total Protein: 6.8 g/dL (ref 6.5–8.1)

## 2018-08-21 LAB — PROTIME-INR
INR: 1.94
Prothrombin Time: 21.9 seconds — ABNORMAL HIGH (ref 11.4–15.2)

## 2018-08-21 LAB — LIPASE, BLOOD: LIPASE: 26 U/L (ref 11–51)

## 2018-08-21 MED ORDER — IPRATROPIUM-ALBUTEROL 0.5-2.5 (3) MG/3ML IN SOLN
3.0000 mL | Freq: Two times a day (BID) | RESPIRATORY_TRACT | Status: DC
  Administered 2018-08-21 – 2018-08-22 (×2): 3 mL via RESPIRATORY_TRACT
  Filled 2018-08-21 (×2): qty 3

## 2018-08-21 MED ORDER — LORAZEPAM 2 MG/ML IJ SOLN
0.5000 mg | Freq: Once | INTRAMUSCULAR | Status: AC
  Administered 2018-08-21: 12:00:00 0.5 mg via INTRAVENOUS
  Filled 2018-08-21: qty 1

## 2018-08-21 MED ORDER — ALBUTEROL SULFATE (2.5 MG/3ML) 0.083% IN NEBU: 2.5000 mg | INHALATION_SOLUTION | RESPIRATORY_TRACT | Status: DC | PRN

## 2018-08-21 MED ORDER — PHYTONADIONE 5 MG PO TABS
5.0000 mg | ORAL_TABLET | Freq: Once | ORAL | Status: AC
  Administered 2018-08-21: 5 mg via ORAL
  Filled 2018-08-21: qty 1

## 2018-08-21 MED ORDER — SODIUM CHLORIDE 0.9 % IV SOLN
INTRAVENOUS | Status: DC | PRN
  Administered 2018-08-21: 250 mL via INTRAVENOUS

## 2018-08-21 MED ORDER — WARFARIN SODIUM 6 MG PO TABS
6.0000 mg | ORAL_TABLET | Freq: Once | ORAL | Status: DC
  Filled 2018-08-21: qty 1

## 2018-08-21 MED ORDER — WARFARIN - PHARMACIST DOSING INPATIENT: Freq: Every day | Status: DC

## 2018-08-21 MED ORDER — IOPAMIDOL (ISOVUE-300) INJECTION 61%
100.0000 mL | Freq: Once | INTRAVENOUS | Status: AC | PRN
  Administered 2018-08-21: 12:00:00 100 mL via INTRAVENOUS

## 2018-08-21 NOTE — Progress Notes (Signed)
Patient ID: Mikayla [name], female   DOB: Aug 01, 1941, 77 y.o.   MRN: [id number]  ACP note  Daughters and family at bedside.  Patient present  Diagnosis: Sepsis with Klebsiella, cholangitis, elevated liver function test, paroxysmal atrial fibrillation, GERD, anxiety, history of stroke.  Patient already a DNR  Plan.  Patient now septic from likely cholangitis.  Case discussed with interventional radiologist and since the patient does not have dilated ducts and is not a candidate for percutaneous liver or gallbladder tube.  Case discussed with gastroenterology and Dr. Allegra Lai will discuss case with Dr. Servando Snare about the possibility of an ERCP.  The patient is not a good candidate for gallbladder removal.  Since the patient is septic need to try to get to the root of the problem so this does not keep on recurring.  Time spent on ACP discussion 17 minutes Dr. Alford Highland

## 2018-08-21 NOTE — Progress Notes (Addendum)
Patient ID: Mikayla [name], female   DOB: [birth year], 77 y.o.   MRN: [id number]   [facility] PROGRESS NOTE  Maegan Buller Aytes WUJ:[id number] DOB: 08-Mar-1941 DOA: 08/19/2018 PCP: System, Pcp Not In  HPI/Subjective: Patient feeling okay.  No abdominal pain or nausea or vomiting.  Objective: Vitals:   08/21/18 0517 08/21/18 0831  BP: (!) 150/64   Pulse: 60 67  Resp: 16 16  Temp: 97.8 F (36.6 C)   SpO2: 99% 97%    Filed Weights   08/19/18 1452  Weight: 88.7 kg    ROS: Review of Systems  Constitutional: Negative for chills and fever.  Eyes: Negative for blurred vision.  Respiratory: Negative for cough and shortness of breath.   Cardiovascular: Negative for chest pain.  Gastrointestinal: Negative for abdominal pain, constipation, diarrhea, nausea and vomiting.  Genitourinary: Negative for dysuria.  Musculoskeletal: Negative for joint pain.  Neurological: Negative for dizziness and headaches.   Exam: Physical Exam  Constitutional: She is oriented to person, place, and time.  HENT:  Nose: No mucosal edema.  Mouth/Throat: No oropharyngeal exudate or posterior oropharyngeal edema.  Eyes: Pupils are equal, round, and reactive to light. Conjunctivae, EOM and lids are normal.  Neck: No JVD present. Carotid bruit is not present. No edema present. No thyroid mass and no thyromegaly present.  Cardiovascular: S1 normal and S2 normal. Exam reveals no gallop.  No murmur heard. Pulses:      Dorsalis pedis pulses are 2+ on the right side, and 2+ on the left side.  Respiratory: No respiratory distress. She has no wheezes. She has no rhonchi. She has no rales.  GI: Soft. Bowel sounds are normal. There is no tenderness.  Musculoskeletal:       Right ankle: She exhibits swelling.       Left ankle: She exhibits swelling.  Lymphadenopathy:    She has no cervical adenopathy.  Neurological: She is alert and oriented to person, place, and time. No cranial nerve deficit.  Skin:  Skin is warm. No rash noted. Nails show no clubbing.  Psychiatric: She has a normal mood and affect.      Data Reviewed: Basic Metabolic Panel: Recent Labs  Lab 08/19/18 1502 08/20/18 0433 08/21/18 0436  NA 140 140 139  K 4.5 4.1 3.5  CL 93* 94* 94*  CO2 35* 34* 31  GLUCOSE 149* 212* 128*  BUN 13 18 17   CREATININE 0.77 0.78 0.77  CALCIUM 8.7* 8.8* 8.7*   Liver Function Tests: Recent Labs  Lab 08/19/18 1502 08/20/18 0433 08/21/18 0436  AST 163* 101* 47*  ALT 61* 118* 77*  ALKPHOS 573* 519* 373*  BILITOT 4.6* 2.8* 1.4*  PROT 7.4 7.4 6.8  ALBUMIN 3.0* 2.8* 2.7*   CBC: Recent Labs  Lab 08/19/18 1502 08/20/18 0433  WBC 23.6* 16.8*  NEUTROABS 21.4*  --   HGB 12.7 11.8*  HCT 42.5 38.9  MCV 92.2 89.6  PLT 364 323   Cardiac Enzymes: Recent Labs  Lab 08/19/18 1502  TROPONINI <0.03   BNP (last 3 results) Recent Labs    08/19/18 1502  BNP 121.0*      Recent Results (from the past 240 hour(s))  Blood Culture (routine x 2)     Status: Abnormal (Preliminary result)   Collection Time: 08/19/18  3:02 PM  Result Value Ref Range Status   Specimen Description   Final    BLOOD BLOOD RIGHT FOREARM Performed at [facility], [address], [place], [place] [address]  Special Requests   Final    BOTTLES DRAWN AEROBIC AND ANAEROBIC Blood Culture results may not be optimal due to an excessive volume of blood received in culture bottles Performed at [facility], [address] [place], [place] [address]    Culture  Setup Time   Final    GRAM NEGATIVE RODS IN BOTH AEROBIC AND ANAEROBIC BOTTLES CRITICAL RESULT CALLED TO, READ BACK BY AND VERIFIED WITH: DAVID BESANTI AT 1914 08/20/18.PMH    Culture (A)  Final    KLEBSIELLA PNEUMONIAE SUSCEPTIBILITIES TO FOLLOW Performed at [facility], [address] [place], [place] [address]    Report Status PENDING  Incomplete  Blood Culture (routine x 2)     Status: Abnormal (Preliminary  result)   Collection Time: 08/19/18  3:02 PM  Result Value Ref Range Status   Specimen Description   Final    BLOOD BLOOD LEFT FOREARM Performed at [facility], [address] [place], [place] [address]    Special Requests   Final    BOTTLES DRAWN AEROBIC AND ANAEROBIC Blood Culture results may not be optimal due to an excessive volume of blood received in culture bottles Performed at [facility], [address], [place], [place] [address]    Culture  Setup Time   Final    GRAM NEGATIVE RODS AEROBIC BOTTLE ONLY CRITICAL RESULT CALLED TO, READ BACK BY AND VERIFIED WITH: DAVID BESANTI AT 4696 08/20/18.PMH Performed at [facility], [address] [place], [place] [address]    Culture KLEBSIELLA PNEUMONIAE (A)  Final   Report Status PENDING  Incomplete  Blood Culture ID Panel (Reflexed)     Status: Abnormal   Collection Time: 08/19/18  3:02 PM  Result Value Ref Range Status   Enterococcus species NOT DETECTED NOT DETECTED Final   Listeria monocytogenes NOT DETECTED NOT DETECTED Final   Staphylococcus species NOT DETECTED NOT DETECTED Final   Staphylococcus aureus (BCID) NOT DETECTED NOT DETECTED Final   Streptococcus species NOT DETECTED NOT DETECTED Final   Streptococcus agalactiae NOT DETECTED NOT DETECTED Final   Streptococcus pneumoniae NOT DETECTED NOT DETECTED Final   Streptococcus pyogenes NOT DETECTED NOT DETECTED Final   Acinetobacter baumannii NOT DETECTED NOT DETECTED Final   Enterobacteriaceae species DETECTED (A) NOT DETECTED Final    Comment: Enterobacteriaceae represent a large family of gram-negative bacteria, not a single organism. CRITICAL RESULT CALLED TO, READ BACK BY AND VERIFIED WITH: DAVID BESANTI AT 4132 08/20/18.PMH    Enterobacter cloacae complex NOT DETECTED NOT DETECTED Final   Escherichia coli NOT DETECTED NOT DETECTED Final   Klebsiella oxytoca NOT DETECTED NOT DETECTED Final   Klebsiella pneumoniae DETECTED (A) NOT  DETECTED Final    Comment: CRITICAL RESULT CALLED TO, READ BACK BY AND VERIFIED WITH: DAVID BESANTI AT 4401 08/20/18.PMH    Proteus species NOT DETECTED NOT DETECTED Final   Serratia marcescens NOT DETECTED NOT DETECTED Final   Carbapenem resistance NOT DETECTED NOT DETECTED Final   Haemophilus influenzae NOT DETECTED NOT DETECTED Final   Neisseria meningitidis NOT DETECTED NOT DETECTED Final   Pseudomonas aeruginosa NOT DETECTED NOT DETECTED Final   Candida albicans NOT DETECTED NOT DETECTED Final   Candida glabrata NOT DETECTED NOT DETECTED Final   Candida krusei NOT DETECTED NOT DETECTED Final   Candida parapsilosis NOT DETECTED NOT DETECTED Final   Candida tropicalis NOT DETECTED NOT DETECTED Final    Comment: Performed at [facility], [address] [place], [place]  [address]     Studies: Dg Chest 1 View  Result Date: 08/19/2018 CLINICAL DATA:  Confusion. EXAM: CHEST  1 VIEW COMPARISON:  Radiograph of August 09, 2018. FINDINGS: Stable cardiomediastinal silhouette. No pneumothorax or pleural effusion is noted. Stable right rib fractures are noted. No acute pulmonary abnormality is noted. IMPRESSION: No acute cardiopulmonary abnormality seen. Electronically Signed   By: Lupita Raider, M.D.   On: 08/19/2018 15:52   Ct Abdomen Pelvis W Contrast  Result Date: 08/21/2018 CLINICAL DATA:  Elevated LFTs with concern for cholangitis. EXAM: CT ABDOMEN AND PELVIS WITH CONTRAST TECHNIQUE: Multidetector CT imaging of the abdomen and pelvis was performed using the standard protocol following bolus administration of intravenous contrast. CONTRAST:  ISOVUE-300 IOPAMIDOL (ISOVUE-300) INJECTION 61% COMPARISON:  CT abdomen pelvis-11/20/2016; right upper quadrant abdominal ultrasound-08/19/2018; 08/09/2018 FINDINGS: Lower chest: Limited visualization of the lower thorax an old fracture involving the posterolateral aspect of the right eighth rib with associated adjacent  pleuroparenchymal thickening. Punctate granuloma are seen within the bilateral lung bases. Are cardiomegaly. Coronary artery calcifications. Calcifications within the aortic root. No pericardial effusion. Hepatobiliary: Normal hepatic contour. Layering gallstones within otherwise normal-appearing gallbladder given decompression. There are ill-defined radiopaque structures adjacent to the distal aspect of the CBD which may represent nonocclusive choledocholithiasis (axial image 34, series 2; coronal images 49 and 50). This finding is without associated significant interval dilatation of the common bile duct again measuring approximately 0.9 cm in greatest short axis diameter (coronal image 49, series 5), similar to the 10/2016 examination. No associated significant intrahepatic biliary duct dilatation. No gallbladder wall thickening or pericholecystic fluid. No ascites. Pancreas: There is a questionable minimal amount peripancreatic stranding. No definitive pancreatic ductal dilatation or evidence of pancreatic necrosis. No discrete pancreatic mass on this non pancreatic protocol CT scan. Spleen: Normal appearance of the spleen. Adrenals/Urinary Tract: There is symmetric enhancement and excretion of the bilateral kidneys. Note is made of a hypoattenuating nonenhancing right-sided renal cyst which measures approximately 1.1 cm in diameter. Additional right-sided hypoattenuating renal lesions are too small to adequately characterize though favored to represent additional renal cysts. No definite renal stones this postcontrast examination. No urine obstruction or perinephric stranding. Note is again made of an exaggerated horizontal lie of the right kidney. There is mild thickening of the medial limbs of the bilateral adrenal glands without discrete nodule. Normal appearance of the urinary bladder given underdistention. Stomach/Bowel: Moderate colonic stool burden without evidence of enteric obstruction. Normal  appearance of the terminal ileum and appendix. No pneumoperitoneum, pneumatosis or portal venous gas. Vascular/Lymphatic: Large amount of atherosclerotic abdominal aorta plaque. The major with a normal caliber branch vessels of the abdominal aorta appear patent on this non CTA examination. No bulky retroperitoneal, mesenteric, pelvic or inguinal lymphadenopathy. Reproductive: Normal appearance of the pelvic organs. No discrete adnexal lesion. No free fluid the pelvic cul-de-sac. Other: Moderate sized narrow neck periumbilical hernia measuring approximately 4.2 x 4.8 cm (sagittal image 55, series 6). Musculoskeletal: As above, old right posterolateral rib fracture. Moderate multilevel lumbar spine DDD, worse at L1-L2 and L2-L3 with disc space height loss, endplate irregularity and sclerosis. Stigmata of DISH within the lower thoracic spine. Marked deformity of the left femoral head with associated severe degenerative change of the left hip, similar to the 10/2016 examination. Moderate degenerative change the right hip with joint space loss, subchondral sclerosis and osteophytosis. IMPRESSION: 1. Ill-defined opacities regional to the distal end of the CBD may represent nonocclusive choledocholithiasis (as was suggested on right upper  quadrant abdominal ultrasound performed 08/10/19 19), however this is without associated interval progressive dilatation of the common bile duct or significant intrahepatic biliary ductal dilatation. Further evaluation with MRCP and/or ERCP could be performed as indicated. 2. Ill-defined stranding about the pancreas as could be seen in the setting early uncomplicated pancreatitis. Correlation with amylase and lipase levels is advised. 3. Cholelithiasis without evidence of acute cholecystitis, given degree of gallbladder underdistention. 4. Old right posterolateral rib fracture and significant deformity of the left femoral head with associated severe degenerative change the left hip. 5.   Aortic Atherosclerosis (ICD10-I70.0). Above findings discussed with Dr. Fonnie Birkenhead at the time of examination completion. Electronically Signed   By: Simonne Come M.D.   On: 08/21/2018 13:12   US Abdomen Limited Ruq  Result Date: 08/19/2018 CLINICAL DATA:  Elevated bilirubin EXAM: ULTRASOUND ABDOMEN LIMITED RIGHT UPPER QUADRANT COMPARISON:  Ultrasound 08/09/2018 FINDINGS: Gallbladder: Suspect that the gallbladder is contracted and filled with shadowing stone. Wall thickness is slightly increased at 3.7 mm. Common bile duct: Diameter: 5.3 mm Liver: Poorly visualized. Slight increased hepatic echogenicity. Portal vein is patent on color Doppler imaging with normal direction of blood flow towards the liver. IMPRESSION: 1. Suspect contracted gallbladder filled with shadowing stones. Slight increased wall thickness could be secondary to contraction. Negative for sonographic Murphy. 2. The visible portion of the bile duct is nonenlarged at 5.3 mm 3. Poorly evaluated liver due to body habitus and immobility. Electronically Signed   By: Jasmine Pang M.D.   On: 08/19/2018 17:21    Scheduled Meds: . amiodarone  200 mg Oral Daily  . carbidopa-levodopa  1 tablet Oral TID  . carvedilol  3.125 mg Oral BID WC  . ferrous sulfate  325 mg Oral Daily  . furosemide  20 mg Oral Daily  . ipratropium-albuterol  3 mL Nebulization BID  . Melatonin  5 mg Oral QHS  . multivitamin with minerals  1 tablet Oral Daily  . pantoprazole  40 mg Oral BID  . PARoxetine  30 mg Oral Daily  . phytonadione  5 mg Oral Once  . potassium chloride SA  20 mEq Oral Daily  . senna  1 tablet Oral Daily  . traZODone  25 mg Oral QHS   Continuous Infusions: . ceFEPime (MAXIPIME) IV 2 g (08/21/18 1122)    Assessment/Plan:  1.  Sepsis with Klebsiella.  Likely from cholangitis.  On cefepime.  Patient absolutely refuses MRCP. 2.  Elevated liver function test.  Seen last time by gastroenterology and surgery.  I spoke with interventional  radiology Dr. Grace Isaac.  He recommended getting a CT scan of the abdomen.  Since the CT scan of the abdomen did not show any dilated liver ducts or common bile ducts and the gallbladder looked okay he did not recommend a percutaneous drainage tube at this time.  I spoke with Dr. Allegra Lai gastroenterology and she will see the patient in the morning and consult Dr. Servando Snare gastroenterology for potential for ERCP.  Liver function tests trending better. 3.  Paroxysmal atrial fibrillation on amiodarone.  Hold Coumadin and give a dose of vitamin K just in case procedure needed for tomorrow or the following day.  Check INR tomorrow morning. 4.  GERD on Protonix 5.  Anxiety on Paxil 6.  History of stroke with left-sided weakness and bedbound. 7.  Patient is a DNR    Code Status:     Code Status Orders  (From admission, onward)  Start     Ordered   08/19/18 1806  Do not attempt resuscitation (DNR)  Continuous    Question Answer Comment  In the event of cardiac or respiratory ARREST Do not call a "code blue"   In the event of cardiac or respiratory ARREST Do not perform Intubation, CPR, defibrillation or ACLS   In the event of cardiac or respiratory ARREST Use medication by any route, position, wound care, and other measures to relive pain and suffering. May use oxygen, suction and manual treatment of airway obstruction as needed for comfort.      08/19/18 1806        Code Status History    Date Active Date Inactive Code Status Order ID Comments User Context   08/10/2018 0111 08/12/2018 2135 DNR [id number]  Cammy Copa, MD Inpatient   11/20/2016 0904 11/21/2016 1854 DNR [id number]  Arnaldo Natal, MD Inpatient   10/24/2016 [id number] 10/30/2016 2126 DNR [id number]  Shaune Pollack, MD Inpatient   09/28/2016 1555 10/02/2016 1523 DNR [id number]  Altamese Dilling, MD Inpatient    Advance Directive Documentation     Most Recent Value  Type of Advance Directive  Healthcare Power of Attorney  Pre-existing  out of facility DNR order (yellow form or pink MOST form)  -  "MOST" Form in Place?  -     Family Communication: Spoke with patient earlier and came back to speak with family Disposition Plan: To be determined  Antibiotics:  Maxipime  Time spent: 30 minutes, including ACP time  [employer]

## 2018-08-21 NOTE — Progress Notes (Addendum)
ANTICOAGULATION CONSULT NOTE - Initial Consult  Pharmacy Consult for Warfarin Dosing and Monitoring  Indication: atrial fibrillation  Allergies  Allergen Reactions  . Levaquin [Levofloxacin]   . Other Nausea And Vomiting  . Sulfa Antibiotics Hives  . Oxycodone Anxiety and Other (See Comments)   Patient Measurements: Height: 5\' 4"  (162.6 cm) Weight: 195 lb 8.8 oz (88.7 kg) IBW/kg (Calculated) : 54.7  Vital Signs: Temp: 97.8 F (36.6 C) (10/27 0517) Temp Source: Oral (10/27 0517) BP: 150/64 (10/27 0517) Pulse Rate: 67 (10/27 0831)  Labs: Recent Labs    08/19/18 1502 08/19/18 1743 08/20/18 0433 08/21/18 0436 08/21/18 0804  HGB 12.7  --  11.8*  --   --   HCT 42.5  --  38.9  --   --   PLT 364  --  323  --   --   LABPROT  --  21.1*  --   --  21.9*  INR  --  1.85  --   --  1.94  CREATININE 0.77  --  0.78 0.77  --   TROPONINI <0.03  --   --   --   --     Estimated Creatinine Clearance: 63.5 mL/min (by C-G formula based on SCr of 0.77 mg/dL).   Medical History: Past Medical History:  Diagnosis Date  . Atrial fibrillation (HCC)   . Empyema [facility])    in [facility] Aug 2017- stayed in hospital on for 2 months.  . Hypertension     Assessment: Pharmacy consulted for Warfarin dosing and monitoring in 77 yo female with PMH of A. Fib. Patient admitted with  Klebsiella bacteremia and is receiving IV Abx. Patient also taking amiodarone PTA.   Home Regimen: Warfarin 5mg  daily  DATE INR DOSE 10/25 1.85 5mg  10/26   --- 5mg  10/26 1.94    Goal of Therapy:  INR 2-3 Monitor platelets by anticoagulation protocol: Yes   Plan:  10/27 INR remains slightly subtherapeutic. Will order warfarin 6mg  x 1 dose.  Will order INR daily while on ABx per [facility].  Plan to F/U with am labs  Gardner Candle, PharmD, BCPS Clinical Pharmacist 08/21/2018 9:56 AM

## 2018-08-22 DIAGNOSIS — K8309 Other cholangitis: Secondary | ICD-10-CM

## 2018-08-22 LAB — BLOOD GAS, VENOUS
Acid-Base Excess: 16.3 mmol/L — ABNORMAL HIGH (ref 0.0–2.0)
BICARBONATE: 44.1 mmol/L — AB (ref 20.0–28.0)
O2 Saturation: 61 %
PATIENT TEMPERATURE: 37
pCO2, Ven: 68 mmHg — ABNORMAL HIGH (ref 44.0–60.0)
pH, Ven: 7.42 (ref 7.250–7.430)

## 2018-08-22 LAB — COMPREHENSIVE METABOLIC PANEL
ALBUMIN: 2.8 g/dL — AB (ref 3.5–5.0)
ALK PHOS: 309 U/L — AB (ref 38–126)
ALT: 39 U/L (ref 0–44)
AST: 43 U/L — AB (ref 15–41)
Anion gap: 11 (ref 5–15)
BILIRUBIN TOTAL: 1.7 mg/dL — AB (ref 0.3–1.2)
BUN: 13 mg/dL (ref 8–23)
CO2: 33 mmol/L — ABNORMAL HIGH (ref 22–32)
CREATININE: 0.78 mg/dL (ref 0.44–1.00)
Calcium: 8.6 mg/dL — ABNORMAL LOW (ref 8.9–10.3)
Chloride: 94 mmol/L — ABNORMAL LOW (ref 98–111)
GFR calc Af Amer: >60 mL/min (ref >60)
GLUCOSE: 112 mg/dL — AB (ref 70–99)
POTASSIUM: 3.5 mmol/L (ref 3.5–5.1)
Sodium: 138 mmol/L (ref 135–145)
TOTAL PROTEIN: 6.6 g/dL (ref 6.5–8.1)

## 2018-08-22 LAB — CBC
HCT: 36.9 % (ref 36.0–46.0)
Hemoglobin: 11.3 g/dL — ABNORMAL LOW (ref 12.0–15.0)
MCH: 27.4 pg (ref 26.0–34.0)
MCHC: 30.6 g/dL (ref 30.0–36.0)
MCV: 89.3 fL (ref 80.0–100.0)
Platelets: 391 10*3/uL (ref 150–400)
RBC: 4.13 MIL/uL (ref 3.87–5.11)
RDW: 16.5 % — AB (ref 11.5–15.5)
WBC: 13.3 10*3/uL — ABNORMAL HIGH (ref 4.0–10.5)
nRBC: 0 % (ref 0.0–0.2)

## 2018-08-22 LAB — PROTIME-INR
INR: 1.74
PROTHROMBIN TIME: 20.1 s — AB (ref 11.4–15.2)

## 2018-08-22 MED ORDER — PHYTONADIONE 5 MG PO TABS
5.0000 mg | ORAL_TABLET | Freq: Once | ORAL | Status: AC
  Administered 2018-08-22: 5 mg via ORAL
  Filled 2018-08-22: qty 1

## 2018-08-22 MED ORDER — IPRATROPIUM-ALBUTEROL 0.5-2.5 (3) MG/3ML IN SOLN
3.0000 mL | RESPIRATORY_TRACT | Status: DC | PRN
  Administered 2018-08-24: 18:00:00 3 mL via RESPIRATORY_TRACT
  Filled 2018-08-22: qty 3

## 2018-08-22 NOTE — Consult Note (Signed)
Cephas Darby, MD [address]  [place]  [place], [place] [address]  Main: [phone]  Fax: [phone] Pager: [phone]   Consultation  Referring Provider:     No ref. provider found Primary Care Physician:  System, Pcp Not In Primary Gastroenterologist:  None        Reason for Consultation:     Cholangitis, evaluate for ERCP  Date of Admission:  08/19/2018 Date of Consultation:  08/22/2018         HPI:   Mikayla [name] is a 77 y.o. Caucasian female with history of A. Fib on Coumadin, empyema status post right thoracotomy decortication and PEG placement with tracheostomy in 11/8784 complicated by prolonged hospital stay, left-sided hemiplegia, has been bedbound since then.  Patient was admitted to [facility] recently on 08/10/2018 secondary to pneumonia, respiratory failure as well as choledocholithiasis. During that admission, Dr. Allen Norris discussed the options of ERCP with stone extraction or CT guided biliary drain by IR. Pt's family (daughters) decided not to pursue any procedures during that hospitalization as his LFTs were improving and felt that the stone may have been passed. Patient got readmitted on 10/25 secondary to sepsis, worsening LFTs as well as Klebsiella pneumonia bacteremia.  She has been receiving cefepime since 08/20/2018.  She had a CT A/P which revealed choledocholithiasis and dilated CBD.  Initially IR was consulted for external/internal biliary drain placement, but due to lack of dilated intrahepatic bile ducts, this route was not pursued.  Therefore, GI is consulted to evaluate for ERCP.  Currently, her LFTs are improving including total bilirubin 1.7 today, it was 4.6 on admission.  Has been afebrile.  Her daughters were at bedside when I interviewed the patient.  Patient denied abdominal pain, nausea or vomiting.  Her appetite is poor, eating very little.  Patient is receiving vitamin K to reverse her INR in anticipation of procedure  NSAIDs:  None  Antiplts/Anticoagulants/Anti thrombotics: Coumadin has been held, receiving vitamin K  GI Procedures: None  Past Medical History:  Diagnosis Date  . Atrial fibrillation ([place])   . Empyema [facility])    in [facility] Aug 2017- stayed in hospital on for 2 months.  . Hypertension     Past Surgical History:  Procedure Laterality Date  . PEG TUBE PLACEMENT      Prior to Admission medications   Medication Sig Start Date End Date Taking? Authorizing Provider  amiodarone (PACERONE) 200 MG tablet Take 1 tablet (200 mg total) by mouth daily. 11/22/16  Yes Demetrios Loll, MD  ferrous sulfate 324 (65 Fe) MG TBEC Take 1 tablet by mouth daily.   Yes [provider]  furosemide (LASIX) 20 MG tablet Take 1 tablet (20 mg total) by mouth daily. 10/30/16  Yes Sudini, Alveta Heimlich, MD  ipratropium-albuterol (DUONEB) 0.5-2.5 (3) MG/3ML SOLN Take 3 mLs by nebulization 3 (three) times daily.   Yes [provider]  Melatonin 5 MG TABS Take 5 mg by mouth at bedtime.   Yes [provider]  Multiple Vitamin (MULTIVITAMIN WITH MINERALS) TABS tablet Take 1 tablet by mouth daily.   Yes [provider]  omeprazole (PRILOSEC OTC) 20 MG tablet Take 20 mg by mouth 2 (two) times daily.   Yes [provider]  PARoxetine (PAXIL) 30 MG tablet Take 30 mg by mouth daily.    Yes [provider]  potassium chloride SA (K-DUR,KLOR-CON) 20 MEQ tablet Take 20 mEq by mouth daily.   Yes [provider]  senna (  SENOKOT) 8.6 MG TABS tablet Take 1 tablet by mouth daily.   Yes [provider]  traZODone (DESYREL) 50 MG tablet Take 25 mg by mouth at bedtime.   Yes [provider]  warfarin (COUMADIN) 5 MG tablet Take 5 mg by mouth daily at 8 pm.   Yes [provider]  carvedilol (COREG) 3.125 MG tablet Take 1 tablet (3.125 mg total) by mouth 2 (two) times daily with a meal. Patient not taking: Reported on 08/19/2018 08/12/18   Vaughan Basta, MD  warfarin  (COUMADIN) 3 MG tablet Take 1.5 tablets (4.5 mg total) by mouth daily. Patient not taking: Reported on 08/19/2018 08/12/18   Vaughan Basta, MD    Current Facility-Administered Medications:  .  0.9 %  sodium chloride infusion, , Intravenous, PRN, Loletha Grayer, MD, Stopped at 08/22/18 0950 .  acetaminophen (TYLENOL) tablet 650 mg, 650 mg, Oral, Q6H PRN, 650 mg at 08/22/18 1430 **OR** acetaminophen (TYLENOL) suppository 650 mg, 650 mg, Rectal, Q6H PRN, Sainani, Vivek J, MD .  amiodarone (PACERONE) tablet 200 mg, 200 mg, Oral, Daily, Henreitta Leber, MD, 200 mg at 08/22/18 0944 .  carbidopa-levodopa (SINEMET IR) 10-100 MG per tablet immediate release 1 tablet, 1 tablet, Oral, TID, Loletha Grayer, MD, 1 tablet at 08/22/18 1556 .  carvedilol (COREG) tablet 3.125 mg, 3.125 mg, Oral, BID WC, Wieting, Richard, MD, 3.125 mg at 08/22/18 1744 .  ceFEPIme (MAXIPIME) 2 g in sodium chloride 0.9 % 100 mL IVPB, 2 g, Intravenous, Q12H, Henreitta Leber, MD, Stopped at 08/22/18 1008 .  ferrous sulfate tablet 325 mg, 325 mg, Oral, Daily, Sainani, Belia Heman, MD, 325 mg at 08/22/18 0944 .  furosemide (LASIX) tablet 20 mg, 20 mg, Oral, Daily, Henreitta Leber, MD, 20 mg at 08/22/18 0943 .  ipratropium-albuterol (DUONEB) 0.5-2.5 (3) MG/3ML nebulizer solution 3 mL, 3 mL, Nebulization, Q4H PRN, Wieting, Richard, MD .  Melatonin TABS 5 mg, 5 mg, Oral, QHS, Sainani, Belia Heman, MD, 5 mg at 08/21/18 2148 .  multivitamin with minerals tablet 1 tablet, 1 tablet, Oral, Daily, Henreitta Leber, MD, 1 tablet at 08/22/18 0943 .  ondansetron (ZOFRAN) tablet 4 mg, 4 mg, Oral, Q6H PRN **OR** ondansetron (ZOFRAN) injection 4 mg, 4 mg, Intravenous, Q6H PRN, Sainani, Vivek J, MD .  pantoprazole (PROTONIX) EC tablet 40 mg, 40 mg, Oral, BID, Henreitta Leber, MD, 40 mg at 08/22/18 0944 .  PARoxetine (PAXIL) tablet 30 mg, 30 mg, Oral, Daily, Sainani, Belia Heman, MD, 30 mg at 08/22/18 1008 .  potassium chloride SA (K-DUR,KLOR-CON) CR  tablet 20 mEq, 20 mEq, Oral, Daily, Henreitta Leber, MD, 20 mEq at 08/22/18 0943 .  senna (SENOKOT) tablet 8.6 mg, 1 tablet, Oral, Daily, Sainani, Belia Heman, MD, 8.6 mg at 08/22/18 0943 .  traZODone (DESYREL) tablet 25 mg, 25 mg, Oral, QHS, Sainani, Belia Heman, MD, 25 mg at 08/21/18 2148  Family History  Problem Relation Age of Onset  . Hypertension Mother   . COPD Mother   . Lung cancer Father   . Liver cancer Sister      Social History   Tobacco Use  . Smoking status: Former Smoker    Types: Cigarettes  . Smokeless tobacco: Never Used  . Tobacco comment: quit in 2001. smoked for 30 years  Substance Use Topics  . Alcohol use: No  . Drug use: No    Allergies as of 08/19/2018 - Review Complete 08/19/2018  Allergen Reaction Noted  . Levaquin [levofloxacin]  08/10/2018  . Other Nausea And Vomiting 06/02/2016  . Sulfa antibiotics Hives 03/22/2015  . Oxycodone Anxiety and Other (See Comments) 08/25/2016    Review of Systems:    All systems reviewed and negative except where noted in HPI.   Physical Exam:  Vital signs in last 24 hours: Temp:  [97.8 F (36.6 C)-98.1 F (36.7 C)] 98 F (36.7 C) (10/28 1525) Pulse Rate:  [66-123] 68 (10/28 1525) Resp:  [18] 18 (10/28 1525) BP: (141-154)/(89-101) 141/89 (10/28 1525) SpO2:  [96 %-99 %] 99 % (10/28 1525) Last BM Date: 08/21/18 General:   Pleasant, cooperative in NAD Head:  Normocephalic and atraumatic. Eyes:   No icterus.   Conjunctiva pink. PERRLA. Ears:  Normal auditory acuity. Neck:  Supple; no masses or thyroidomegaly Lungs: Respirations even and unlabored. Lungs clear to auscultation bilaterally.   No wheezes, crackles, or rhonchi.  Heart:  Regular rate and rhythm;  Without murmur, clicks, rubs or gallops Abdomen:  Soft, obese, nondistended, nontender. Normal bowel sounds. No appreciable masses or hepatomegaly.  No rebound or guarding.  Rectal:  Not performed. Msk: Left-sided weakness Extremities:  Without edema, cyanosis  or clubbing. Neurologic:  Alert and oriented x3; left-sided weakness Skin:  Intact without significant lesions or rashes. Psych:  Alert and cooperative. Normal affect.  LAB RESULTS: CBC Latest Ref Rng & Units 08/22/2018 08/20/2018 08/19/2018  WBC 4.0 - 10.5 K/uL 13.3(H) 16.8(H) 23.6(H)  Hemoglobin 12.0 - 15.0 g/dL 11.3(L) 11.8(L) 12.7  Hematocrit 36.0 - 46.0 % 36.9 38.9 42.5  Platelets 150 - 400 K/uL 391 323 364    BMET BMP Latest Ref Rng & Units 08/22/2018 08/21/2018 08/20/2018  Glucose 70 - 99 mg/dL 112(H) 128(H) 212(H)  BUN 8 - 23 mg/dL _0 Creatinine 0.44 - 1.00 mg/dL 0.78 0.77 0.78  Sodium 135 - 145 mmol/L 138 139 140  Potassium 3.5 - 5.1 mmol/L 3.5 3.5 4.1  Chloride 98 - 111 mmol/L 94(L) 94(L) 94(L)  CO2 22 - 32 mmol/L 33(H) 31 34(H)  Calcium 8.9 - 10.3 mg/dL 8.6(L) 8.7(L) 8.8(L)    LFT Hepatic Function Latest Ref Rng & Units 08/22/2018 08/21/2018 08/20/2018  Total Protein 6.5 - 8.1 g/dL 6.6 6.8 7.4  Albumin 3.5 - 5.0 g/dL 2.8(L) 2.7(L) 2.8(L)  AST 15 - 41 U/L 43(H) 47(H) 101(H)  ALT 0 - 44 U/L 39 77(H) 118(H)  Alk Phosphatase 38 - 126 U/L 309(H) 373(H) 519(H)  Total Bilirubin 0.3 - 1.2 mg/dL 1.7(H) 1.4(H) 2.8(H)  Bilirubin, Direct 0.0 - 0.2 mg/dL - - -     STUDIES: Ct Abdomen Pelvis W Contrast  Result Date: 08/21/2018 CLINICAL DATA:  Elevated LFTs with concern for cholangitis. EXAM: CT ABDOMEN AND PELVIS WITH CONTRAST TECHNIQUE: Multidetector CT imaging of the abdomen and pelvis was performed using the standard protocol following bolus administration of intravenous contrast. CONTRAST:  139m ISOVUE-300 IOPAMIDOL (ISOVUE-300) INJECTION 61% COMPARISON:  CT abdomen pelvis-11/20/2016; right upper quadrant abdominal ultrasound-08/19/2018; 08/09/2018 FINDINGS: Lower chest: Limited visualization of the lower thorax an old fracture involving the posterolateral aspect of the right eighth rib with associated adjacent pleuroparenchymal thickening. Punctate granuloma are  seen within the bilateral lung bases. Are cardiomegaly. Coronary artery calcifications. Calcifications within the aortic root. No pericardial effusion. Hepatobiliary: Normal hepatic contour. Layering gallstones within otherwise normal-appearing gallbladder given decompression. There are ill-defined radiopaque structures adjacent to the distal aspect of the CBD which may represent nonocclusive choledocholithiasis (axial image 34, series 2; coronal images 49 and 50). This finding is without associated  significant interval dilatation of the common bile duct again measuring approximately 0.9 cm in greatest short axis diameter (coronal image 49, series 5), similar to the 10/2016 examination. No associated significant intrahepatic biliary duct dilatation. No gallbladder wall thickening or pericholecystic fluid. No ascites. Pancreas: There is a questionable minimal amount peripancreatic stranding. No definitive pancreatic ductal dilatation or evidence of pancreatic necrosis. No discrete pancreatic mass on this non pancreatic protocol CT scan. Spleen: Normal appearance of the spleen. Adrenals/Urinary Tract: There is symmetric enhancement and excretion of the bilateral kidneys. Note is made of a hypoattenuating nonenhancing right-sided renal cyst which measures approximately 1.1 cm in diameter. Additional right-sided hypoattenuating renal lesions are too small to adequately characterize though favored to represent additional renal cysts. No definite renal stones this postcontrast examination. No urine obstruction or perinephric stranding. Note is again made of an exaggerated horizontal lie of the right kidney. There is mild thickening of the medial limbs of the bilateral adrenal glands without discrete nodule. Normal appearance of the urinary bladder given underdistention. Stomach/Bowel: Moderate colonic stool burden without evidence of enteric obstruction. Normal appearance of the terminal ileum and appendix. No  pneumoperitoneum, pneumatosis or portal venous gas. Vascular/Lymphatic: Large amount of atherosclerotic abdominal aorta plaque. The major with a normal caliber branch vessels of the abdominal aorta appear patent on this non CTA examination. No bulky retroperitoneal, mesenteric, pelvic or inguinal lymphadenopathy. Reproductive: Normal appearance of the pelvic organs. No discrete adnexal lesion. No free fluid the pelvic cul-de-sac. Other: Moderate sized narrow neck periumbilical hernia measuring approximately 4.2 x 4.8 cm (sagittal image 55, series 6). Musculoskeletal: As above, old right posterolateral rib fracture. Moderate multilevel lumbar spine DDD, worse at L1-L2 and L2-L3 with disc space height loss, endplate irregularity and sclerosis. Stigmata of DISH within the lower thoracic spine. Marked deformity of the left femoral head with associated severe degenerative change of the left hip, similar to the 10/2016 examination. Moderate degenerative change the right hip with joint space loss, subchondral sclerosis and osteophytosis. IMPRESSION: 1. Ill-defined opacities regional to the distal end of the CBD may represent nonocclusive choledocholithiasis (as was suggested on right upper quadrant abdominal ultrasound performed 08/10/19 19), however this is without associated interval progressive dilatation of the common bile duct or significant intrahepatic biliary ductal dilatation. Further evaluation with MRCP and/or ERCP could be performed as indicated. 2. Ill-defined stranding about the pancreas as could be seen in the setting early uncomplicated pancreatitis. Correlation with amylase and lipase levels is advised. 3. Cholelithiasis without evidence of acute cholecystitis, given degree of gallbladder underdistention. 4. Old right posterolateral rib fracture and significant deformity of the left femoral head with associated severe degenerative change the left hip. 5.  Aortic Atherosclerosis (ICD10-I70.0). Above  findings discussed with Dr. Bobbye Charleston at the time of examination completion. Electronically Signed   By: Sandi Mariscal M.D.   On: 08/21/2018 13:12      Impression / Plan:   Mikayla [name] is a 77 y.o. Caucasian female with history of A. fib on Coumadin, history of empyema status post decortication the lung in 7001 complicated by left-sided hemiplegia, bedbound with choledocholithiasis, admitted with ascending cholangitis from Klebsiella pneumonia bacteremia  -Discussed with patient's both daughters about ERCP as CT-guided biliary drain placement is not an option at this time.  They had concerns about anesthesia and I tried to answer to the best of my ability.  I told him that they will meet anesthesiologist tomorrow in the preop area. They both agreed for their mom to  undergo ERCP by Dr. Allen Norris tomorrow -N.p.o. past midnight -Administer vitamin K 5 mg p.o. x1 today   Thank you for involving me in the care of this patient.      LOS: 3 days   Sherri Sear, MD  08/22/2018, 7:06 PM   Note: This dictation was prepared with Dragon dictation along with smaller phrase technology. Any transcriptional errors that result from this process are unintentional.

## 2018-08-22 NOTE — Progress Notes (Signed)
Patient ID: Mikayla [name], female   DOB: 1941-04-15, 77 y.o.   MRN: [id number]   [facility] PROGRESS NOTE  Mikayla [name] QMV:[id number] DOB: 19-Jun-1941 DOA: 08/19/2018 PCP: System, Pcp Not In  HPI/Subjective: Patient did not sleep well last night.  Was not feeling well this morning but did not specify any complaints.  No abdominal pain.  No nausea or vomiting.  Objective: Vitals:   08/22/18 0436 08/22/18 0821  BP: (!) 154/101   Pulse: (!) 123 71  Resp:  18  Temp: 98.1 F (36.7 C)   SpO2: 97% 98%    Filed Weights   08/19/18 1452  Weight: 88.7 kg    ROS: Review of Systems  Constitutional: Negative for chills and fever.  Eyes: Negative for blurred vision.  Respiratory: Negative for cough and shortness of breath.   Cardiovascular: Negative for chest pain.  Gastrointestinal: Negative for abdominal pain, constipation, diarrhea, nausea and vomiting.  Genitourinary: Negative for dysuria.  Musculoskeletal: Negative for joint pain.  Neurological: Negative for dizziness and headaches.   Exam: Physical Exam  Constitutional: She is oriented to person, place, and time.  HENT:  Nose: No mucosal edema.  Mouth/Throat: No oropharyngeal exudate or posterior oropharyngeal edema.  Eyes: Pupils are equal, round, and reactive to light. Conjunctivae, EOM and lids are normal.  Neck: No JVD present. Carotid bruit is not present. No edema present. No thyroid mass and no thyromegaly present.  Cardiovascular: S1 normal and S2 normal. Exam reveals no gallop.  No murmur heard. Pulses:      Dorsalis pedis pulses are 2+ on the right side, and 2+ on the left side.  Respiratory: No respiratory distress. She has no wheezes. She has no rhonchi. She has no rales.  GI: Soft. Bowel sounds are normal. There is no tenderness.  Musculoskeletal:       Right ankle: She exhibits swelling.       Left ankle: She exhibits swelling.  Lymphadenopathy:    She has no cervical adenopathy.   Neurological: She is alert and oriented to person, place, and time. No cranial nerve deficit.  Skin: Skin is warm. No rash noted. Nails show no clubbing.  Psychiatric: She has a normal mood and affect.      Data Reviewed: Basic Metabolic Panel: Recent Labs  Lab 08/19/18 1502 08/20/18 0433 08/21/18 0436 08/22/18 0423  NA 140 140 139 138  K 4.5 4.1 3.5 3.5  CL 93* 94* 94* 94*  CO2 35* 34* 31 33*  GLUCOSE 149* 212* 128* 112*  BUN 13 18 17 13   CREATININE 0.77 0.78 0.77 0.78  CALCIUM 8.7* 8.8* 8.7* 8.6*   Liver Function Tests: Recent Labs  Lab 08/19/18 1502 08/20/18 0433 08/21/18 0436 08/22/18 0423  AST 163* 101* 47* 43*  ALT 61* 118* 77* 39  ALKPHOS 573* 519* 373* 309*  BILITOT 4.6* 2.8* 1.4* 1.7*  PROT 7.4 7.4 6.8 6.6  ALBUMIN 3.0* 2.8* 2.7* 2.8*   CBC: Recent Labs  Lab 08/19/18 1502 08/20/18 0433 08/22/18 0423  WBC 23.6* 16.8* 13.3*  NEUTROABS 21.4*  --   --   HGB 12.7 11.8* 11.3*  HCT 42.5 38.9 36.9  MCV 92.2 89.6 89.3  PLT 364 323 391   Cardiac Enzymes: Recent Labs  Lab 08/19/18 1502  TROPONINI <0.03   BNP (last 3 results) Recent Labs    08/19/18 1502  BNP 121.0*      Recent Results (from the past 240 hour(s))  Blood Culture (routine x 2)  Status: Abnormal (Preliminary result)   Collection Time: 08/19/18  3:02 PM  Result Value Ref Range Status   Specimen Description   Final    BLOOD BLOOD RIGHT FOREARM Performed at [facility], [address] [place], [place] [address]    Special Requests   Final    BOTTLES DRAWN AEROBIC AND ANAEROBIC Blood Culture results may not be optimal due to an excessive volume of blood received in culture bottles Performed at [facility], [address], [place], [place] [address]    Culture  Setup Time   Final    GRAM NEGATIVE RODS IN BOTH AEROBIC AND ANAEROBIC BOTTLES CRITICAL RESULT CALLED TO, READ BACK BY AND VERIFIED WITH: DAVID BESANTI AT 5621 08/20/18.PMH    Culture (A)   Final    KLEBSIELLA PNEUMONIAE REPEATING SUSCEPTIBILITIES Performed at [facility], [address] [place], [place] [address]    Report Status PENDING  Incomplete  Blood Culture (routine x 2)     Status: Abnormal (Preliminary result)   Collection Time: 08/19/18  3:02 PM  Result Value Ref Range Status   Specimen Description   Final    BLOOD BLOOD LEFT FOREARM Performed at [facility], [address], [place], [place] [address]    Special Requests   Final    BOTTLES DRAWN AEROBIC AND ANAEROBIC Blood Culture results may not be optimal due to an excessive volume of blood received in culture bottles Performed at [facility], [address], [place], [place] [address]    Culture  Setup Time   Final    GRAM NEGATIVE RODS AEROBIC BOTTLE ONLY CRITICAL RESULT CALLED TO, READ BACK BY AND VERIFIED WITH: DAVID BESANTI AT 8413 08/20/18.PMH Performed at [facility], [address] [place], [place] [address]    Culture KLEBSIELLA PNEUMONIAE (A)  Final   Report Status PENDING  Incomplete  Blood Culture ID Panel (Reflexed)     Status: Abnormal   Collection Time: 08/19/18  3:02 PM  Result Value Ref Range Status   Enterococcus species NOT DETECTED NOT DETECTED Final   Listeria monocytogenes NOT DETECTED NOT DETECTED Final   Staphylococcus species NOT DETECTED NOT DETECTED Final   Staphylococcus aureus (BCID) NOT DETECTED NOT DETECTED Final   Streptococcus species NOT DETECTED NOT DETECTED Final   Streptococcus agalactiae NOT DETECTED NOT DETECTED Final   Streptococcus pneumoniae NOT DETECTED NOT DETECTED Final   Streptococcus pyogenes NOT DETECTED NOT DETECTED Final   Acinetobacter baumannii NOT DETECTED NOT DETECTED Final   Enterobacteriaceae species DETECTED (A) NOT DETECTED Final    Comment: Enterobacteriaceae represent a large family of gram-negative bacteria, not a single organism. CRITICAL RESULT CALLED TO, READ BACK BY AND VERIFIED WITH: DAVID  BESANTI AT 0272 08/20/18.PMH    Enterobacter cloacae complex NOT DETECTED NOT DETECTED Final   Escherichia coli NOT DETECTED NOT DETECTED Final   Klebsiella oxytoca NOT DETECTED NOT DETECTED Final   Klebsiella pneumoniae DETECTED (A) NOT DETECTED Final    Comment: CRITICAL RESULT CALLED TO, READ BACK BY AND VERIFIED WITH: DAVID BESANTI AT 5366 08/20/18.PMH    Proteus species NOT DETECTED NOT DETECTED Final   Serratia marcescens NOT DETECTED NOT DETECTED Final   Carbapenem resistance NOT DETECTED NOT DETECTED Final   Haemophilus influenzae NOT DETECTED NOT DETECTED Final   Neisseria meningitidis NOT DETECTED NOT DETECTED Final   Pseudomonas aeruginosa NOT DETECTED NOT DETECTED Final   Candida albicans NOT DETECTED NOT DETECTED Final   Candida glabrata NOT  DETECTED NOT DETECTED Final   Candida krusei NOT DETECTED NOT DETECTED Final   Candida parapsilosis NOT DETECTED NOT DETECTED Final   Candida tropicalis NOT DETECTED NOT DETECTED Final    Comment: Performed at [facility], [address], [place], [place] [address]     Studies: Ct Abdomen Pelvis W Contrast  Result Date: 08/21/2018 CLINICAL DATA:  Elevated LFTs with concern for cholangitis. EXAM: CT ABDOMEN AND PELVIS WITH CONTRAST TECHNIQUE: Multidetector CT imaging of the abdomen and pelvis was performed using the standard protocol following bolus administration of intravenous contrast. CONTRAST:  ISOVUE-300 IOPAMIDOL (ISOVUE-300) INJECTION 61% COMPARISON:  CT abdomen pelvis-11/20/2016; right upper quadrant abdominal ultrasound-08/19/2018; 08/09/2018 FINDINGS: Lower chest: Limited visualization of the lower thorax an old fracture involving the posterolateral aspect of the right eighth rib with associated adjacent pleuroparenchymal thickening. Punctate granuloma are seen within the bilateral lung bases. Are cardiomegaly. Coronary artery calcifications. Calcifications within the aortic root. No pericardial effusion.  Hepatobiliary: Normal hepatic contour. Layering gallstones within otherwise normal-appearing gallbladder given decompression. There are ill-defined radiopaque structures adjacent to the distal aspect of the CBD which may represent nonocclusive choledocholithiasis (axial image 34, series 2; coronal images 49 and 50). This finding is without associated significant interval dilatation of the common bile duct again measuring approximately 0.9 cm in greatest short axis diameter (coronal image 49, series 5), similar to the 10/2016 examination. No associated significant intrahepatic biliary duct dilatation. No gallbladder wall thickening or pericholecystic fluid. No ascites. Pancreas: There is a questionable minimal amount peripancreatic stranding. No definitive pancreatic ductal dilatation or evidence of pancreatic necrosis. No discrete pancreatic mass on this non pancreatic protocol CT scan. Spleen: Normal appearance of the spleen. Adrenals/Urinary Tract: There is symmetric enhancement and excretion of the bilateral kidneys. Note is made of a hypoattenuating nonenhancing right-sided renal cyst which measures approximately 1.1 cm in diameter. Additional right-sided hypoattenuating renal lesions are too small to adequately characterize though favored to represent additional renal cysts. No definite renal stones this postcontrast examination. No urine obstruction or perinephric stranding. Note is again made of an exaggerated horizontal lie of the right kidney. There is mild thickening of the medial limbs of the bilateral adrenal glands without discrete nodule. Normal appearance of the urinary bladder given underdistention. Stomach/Bowel: Moderate colonic stool burden without evidence of enteric obstruction. Normal appearance of the terminal ileum and appendix. No pneumoperitoneum, pneumatosis or portal venous gas. Vascular/Lymphatic: Large amount of atherosclerotic abdominal aorta plaque. The major with a normal caliber  branch vessels of the abdominal aorta appear patent on this non CTA examination. No bulky retroperitoneal, mesenteric, pelvic or inguinal lymphadenopathy. Reproductive: Normal appearance of the pelvic organs. No discrete adnexal lesion. No free fluid the pelvic cul-de-sac. Other: Moderate sized narrow neck periumbilical hernia measuring approximately 4.2 x 4.8 cm (sagittal image 55, series 6). Musculoskeletal: As above, old right posterolateral rib fracture. Moderate multilevel lumbar spine DDD, worse at L1-L2 and L2-L3 with disc space height loss, endplate irregularity and sclerosis. Stigmata of DISH within the lower thoracic spine. Marked deformity of the left femoral head with associated severe degenerative change of the left hip, similar to the 10/2016 examination. Moderate degenerative change the right hip with joint space loss, subchondral sclerosis and osteophytosis. IMPRESSION: 1. Ill-defined opacities regional to the distal end of the CBD may represent nonocclusive choledocholithiasis (as was suggested on right upper quadrant abdominal ultrasound performed 08/10/19 19), however this is without associated interval progressive dilatation of the common bile duct or significant intrahepatic biliary ductal  dilatation. Further evaluation with MRCP and/or ERCP could be performed as indicated. 2. Ill-defined stranding about the pancreas as could be seen in the setting early uncomplicated pancreatitis. Correlation with amylase and lipase levels is advised. 3. Cholelithiasis without evidence of acute cholecystitis, given degree of gallbladder underdistention. 4. Old right posterolateral rib fracture and significant deformity of the left femoral head with associated severe degenerative change the left hip. 5.  Aortic Atherosclerosis (ICD10-I70.0). Above findings discussed with Dr. Fonnie Birkenhead at the time of examination completion. Electronically Signed   By: Simonne Come M.D.   On: 08/21/2018 13:12    Scheduled Meds: .  amiodarone  200 mg Oral Daily  . carbidopa-levodopa  1 tablet Oral TID  . carvedilol  3.125 mg Oral BID WC  . ferrous sulfate  325 mg Oral Daily  . furosemide  20 mg Oral Daily  . Melatonin  5 mg Oral QHS  . multivitamin with minerals  1 tablet Oral Daily  . pantoprazole  40 mg Oral BID  . PARoxetine  30 mg Oral Daily  . potassium chloride SA  20 mEq Oral Daily  . senna  1 tablet Oral Daily  . traZODone  25 mg Oral QHS   Continuous Infusions: . sodium chloride Stopped (08/21/18 2301)  . ceFEPime (MAXIPIME) IV 2 g (08/22/18 0949)    Assessment/Plan:  1.  Sepsis with Klebsiella.  Likely from cholangitis.  Cefepime switched over to Rocephin.  Patient absolutely refuses MRCP.  ERCP for tomorrow. 2.  Elevated liver function test.  Liver function tests trending better but have not normalized yet.  ERCP for tomorrow. 3.  Paroxysmal atrial fibrillation on amiodarone.  Hold Coumadin and patient given 1 dose of vitamin K yesterday.  INR still little too high for procedure today.  Check INR tomorrow and if less than 1.5 will undergo ERCP. 4.  GERD on Protonix 5.  Anxiety on Paxil 6.  History of stroke with left-sided weakness and bedbound. 7.  Patient is a DNR 8.  Poor IV access okay to put a PICC line if needed.    Code Status:     Code Status Orders  (From admission, onward)         Start     Ordered   08/19/18 1806  Do not attempt resuscitation (DNR)  Continuous    Question Answer Comment  In the event of cardiac or respiratory ARREST Do not call a "code blue"   In the event of cardiac or respiratory ARREST Do not perform Intubation, CPR, defibrillation or ACLS   In the event of cardiac or respiratory ARREST Use medication by any route, position, wound care, and other measures to relive pain and suffering. May use oxygen, suction and manual treatment of airway obstruction as needed for comfort.      08/19/18 1806        Code Status History    Date Active Date Inactive Code  Status Order ID Comments User Context   08/10/2018 0111 08/12/2018 2135 DNR [id number]  Cammy Copa, MD Inpatient   11/20/2016 0904 11/21/2016 1854 DNR [id number]  Arnaldo Natal, MD Inpatient   10/24/2016 [phone] 10/30/2016 2126 DNR [id number]  Shaune Pollack, MD Inpatient   09/28/2016 1555 10/02/2016 1523 DNR [id number]  Altamese Dilling, MD Inpatient    Advance Directive Documentation     Most Recent Value  Type of Advance Directive  Healthcare Power of Attorney  Pre-existing out of facility DNR order (yellow form or pink MOST form)  -  "  MOST" Form in Place?  -     Family Communication: Spoke with daughter at the bedside about ERCP for tomorrow Disposition Plan: Back to facility at some point  Antibiotics:  Rocephin  Time spent: 25 minutes.  Demitrus Francisco [employer]

## 2018-08-22 NOTE — Care Management Important Message (Signed)
Important Message  Patient Details  Name: EUSTACIA URBANEK MRN: [id number] Date of Birth: 05-12-1941   Medicare Important Message Given:  Yes    Olegario Messier A Tahjai Schetter 08/22/2018, 10:53 AM

## 2018-08-22 NOTE — Progress Notes (Signed)
Please note patient is currently followed by outpatient Palliative at [facility]. CSW Candace Garrison made aware. °Karen Robertson RN, BSN, CHPN °[facility], hospital Liaison °[phone] °

## 2018-08-22 NOTE — Care Management Note (Signed)
Case Management Note  Patient Details  Name: SIMCHA FARRINGTON MRN: [id number] Date of Birth: 1941/09/29  Subjective/Objective:                 Patient presents from [facility] where she is under a long term care plan of care. Being treated for sepsis possibly due to cholangitis. GI consult is pending.  Patient has declined MRCP    Action/Plan:  Return to [facility] when medically stable  Expected Discharge Date:                  Expected Discharge Plan:     In-House Referral:     Discharge planning Services     Post Acute Care Choice:    Choice offered to:     DME Arranged:    DME Agency:     HH Arranged:    HH Agency:     Status of Service:     If discussed at [employer], dates discussed:    Additional Comments:  Eber Hong, RN 08/22/2018, 8:34 AM

## 2018-08-23 ENCOUNTER — Inpatient Hospital Stay: Payer: Medicare Other | Admitting: Anesthesiology

## 2018-08-23 ENCOUNTER — Inpatient Hospital Stay: Payer: Medicare Other

## 2018-08-23 ENCOUNTER — Encounter: Payer: Self-pay | Admitting: Anesthesiology

## 2018-08-23 ENCOUNTER — Encounter
Admission: EM | Disposition: A | Discharge status: Skilled Nursing Facility | Payer: Self-pay | Source: Home / Self Care | Attending: Internal Medicine

## 2018-08-23 DIAGNOSIS — K8309 Other cholangitis: Secondary | ICD-10-CM

## 2018-08-23 DIAGNOSIS — K8033 Calculus of bile duct with acute cholangitis with obstruction: Secondary | ICD-10-CM

## 2018-08-23 HISTORY — PX: ERCP: SHX5425

## 2018-08-23 LAB — PROTIME-INR
INR: 1.17
Prothrombin Time: 14.8 seconds (ref 11.4–15.2)

## 2018-08-23 LAB — CARBAPENEM RESISTANCE PANEL
CARBA RESISTANCE IMP GENE: NOT DETECTED
CARBA RESISTANCE KPC GENE: NOT DETECTED
CARBA RESISTANCE OXA48 GENE: NOT DETECTED
CARBA RESISTANCE VIM GENE: NOT DETECTED
Carba Resistance NDM Gene: NOT DETECTED

## 2018-08-23 SURGERY — ERCP, WITH INTERVENTION IF INDICATED
Anesthesia: General

## 2018-08-23 MED ORDER — PROPOFOL 10 MG/ML IV BOLUS
INTRAVENOUS | Status: DC | PRN
  Administered 2018-08-23: 140 mg via INTRAVENOUS

## 2018-08-23 MED ORDER — SUGAMMADEX SODIUM 200 MG/2ML IV SOLN
INTRAVENOUS | Status: DC | PRN
  Administered 2018-08-23: 200 mg via INTRAVENOUS

## 2018-08-23 MED ORDER — INDOMETHACIN 50 MG RE SUPP
RECTAL | Status: AC
  Filled 2018-08-23: qty 2

## 2018-08-23 MED ORDER — INDOMETHACIN 50 MG RE SUPP
100.0000 mg | Freq: Once | RECTAL | Status: AC
  Administered 2018-08-23: 100 mg via RECTAL

## 2018-08-23 MED ORDER — FENTANYL CITRATE (PF) 100 MCG/2ML IJ SOLN: 25.0000 ug | INTRAMUSCULAR | Status: DC | PRN

## 2018-08-23 MED ORDER — FENTANYL CITRATE (PF) 100 MCG/2ML IJ SOLN
INTRAMUSCULAR | Status: DC | PRN
  Administered 2018-08-23: 50 ug via INTRAVENOUS

## 2018-08-23 MED ORDER — METHYLPREDNISOLONE SODIUM SUCC 40 MG IJ SOLR
40.0000 mg | Freq: Once | INTRAMUSCULAR | Status: AC
  Administered 2018-08-23: 16:00:00 40 mg via INTRAVENOUS
  Filled 2018-08-23: qty 1

## 2018-08-23 MED ORDER — FENTANYL CITRATE (PF) 100 MCG/2ML IJ SOLN
INTRAMUSCULAR | Status: AC
  Filled 2018-08-23: qty 2

## 2018-08-23 MED ORDER — LIDOCAINE HCL (CARDIAC) PF 100 MG/5ML IV SOSY
PREFILLED_SYRINGE | INTRAVENOUS | Status: DC | PRN
  Administered 2018-08-23: 50 mg via INTRAVENOUS

## 2018-08-23 MED ORDER — ROCURONIUM BROMIDE 100 MG/10ML IV SOLN
INTRAVENOUS | Status: DC | PRN
  Administered 2018-08-23: 40 mg via INTRAVENOUS

## 2018-08-23 MED ORDER — SODIUM CHLORIDE 0.9 % IV SOLN
2.0000 g | INTRAVENOUS | Status: DC
  Filled 2018-08-23: qty 20

## 2018-08-23 MED ORDER — SODIUM CHLORIDE 0.9 % IV SOLN
INTRAVENOUS | Status: DC | PRN
  Administered 2018-08-23: 12:00:00 via INTRAVENOUS

## 2018-08-23 MED ORDER — WARFARIN SODIUM 2.5 MG PO TABS: 2.5000 mg | ORAL_TABLET | Freq: Once | ORAL | Status: DC

## 2018-08-23 MED ORDER — WARFARIN - PHARMACIST DOSING INPATIENT
Freq: Every day | Status: DC
  Administered 2018-08-23: 18:00:00

## 2018-08-23 MED ORDER — WARFARIN SODIUM 2.5 MG PO TABS
2.5000 mg | ORAL_TABLET | Freq: Once | ORAL | Status: AC
  Administered 2018-08-23: 18:00:00 2.5 mg via ORAL
  Filled 2018-08-23: qty 1

## 2018-08-23 MED ORDER — SUGAMMADEX SODIUM 500 MG/5ML IV SOLN
INTRAVENOUS | Status: AC
  Filled 2018-08-23: qty 5

## 2018-08-23 NOTE — Anesthesia Procedure Notes (Addendum)
Procedure Name: Intubation Performed by: Mathews Argyle, CRNA Pre-anesthesia Checklist: Patient identified, Patient being monitored, Timeout performed, Emergency Drugs available and Suction available Patient Re-evaluated:Patient Re-evaluated prior to induction Oxygen Delivery Method: Circle system utilized Preoxygenation: Pre-oxygenation with 100% oxygen Induction Type: IV induction Ventilation: Mask ventilation without difficulty and Oral airway inserted - appropriate to patient size Laryngoscope Size: 3 and McGraph Grade View: Grade I Tube type: Oral Tube size: 7.0 mm Number of attempts: 1 Airway Equipment and Method: Stylet and Video-laryngoscopy Placement Confirmation: ETT inserted through vocal cords under direct vision,  positive ETCO2 and breath sounds checked- equal and bilateral Secured at: 21 cm Tube secured with: Tape Dental Injury: Teeth and Oropharynx as per pre-operative assessment

## 2018-08-23 NOTE — Progress Notes (Signed)
Patient ID: Mikayla BEHLER, female   DOB: 1941-08-16, 77 y.o.   MRN: [id number]   [employer] [facility] PROGRESS NOTE  Mikayla [name] WUJ:[id number] DOB: 1941-02-21 DOA: 08/19/2018 PCP: System, Pcp Not In  HPI/Subjective: Patient was seen prior to procedure.  Offers no complaints.  Felt okay.  Objective: Vitals:   08/23/18 1354 08/23/18 1557  BP: 138/77 (!) 142/57  Pulse: 64 66  Resp: 15 20  Temp: 98.4 F (36.9 C) 98.2 F (36.8 C)  SpO2: 95% 99%    Filed Weights   08/19/18 1452  Weight: 88.7 kg    ROS: Review of Systems  Constitutional: Negative for chills and fever.  Eyes: Negative for blurred vision.  Respiratory: Negative for cough and shortness of breath.   Cardiovascular: Negative for chest pain.  Gastrointestinal: Negative for abdominal pain, constipation, diarrhea, nausea and vomiting.  Genitourinary: Negative for dysuria.  Musculoskeletal: Negative for joint pain.  Neurological: Negative for dizziness and headaches.   Exam: Physical Exam  Constitutional: She is oriented to person, place, and time.  HENT:  Nose: No mucosal edema.  Mouth/Throat: No oropharyngeal exudate or posterior oropharyngeal edema.  Eyes: Pupils are equal, round, and reactive to light. Conjunctivae, EOM and lids are normal.  Neck: No JVD present. Carotid bruit is not present. No edema present. No thyroid mass and no thyromegaly present.  Cardiovascular: S1 normal and S2 normal. Exam reveals no gallop.  No murmur heard. Pulses:      Dorsalis pedis pulses are 2+ on the right side, and 2+ on the left side.  Respiratory: No respiratory distress. She has no wheezes. She has no rhonchi. She has no rales.  GI: Soft. Bowel sounds are normal. There is no tenderness.  Musculoskeletal:       Right ankle: She exhibits swelling.       Left ankle: She exhibits swelling.  Lymphadenopathy:    She has no cervical adenopathy.  Neurological: She is alert and oriented to person, place, and time. No  cranial nerve deficit.  Skin: Skin is warm. No rash noted. Nails show no clubbing.  Psychiatric: She has a normal mood and affect.      Data Reviewed: Basic Metabolic Panel: Recent Labs  Lab 08/19/18 1502 08/20/18 0433 08/21/18 0436 08/22/18 0423  NA 140 140 139 138  K 4.5 4.1 3.5 3.5  CL 93* 94* 94* 94*  CO2 35* 34* 31 33*  GLUCOSE 149* 212* 128* 112*  BUN 13 18 17 13   CREATININE 0.77 0.78 0.77 0.78  CALCIUM 8.7* 8.8* 8.7* 8.6*   Liver Function Tests: Recent Labs  Lab 08/19/18 1502 08/20/18 0433 08/21/18 0436 08/22/18 0423  AST 163* 101* 47* 43*  ALT 61* 118* 77* 39  ALKPHOS 573* 519* 373* 309*  BILITOT 4.6* 2.8* 1.4* 1.7*  PROT 7.4 7.4 6.8 6.6  ALBUMIN 3.0* 2.8* 2.7* 2.8*   CBC: Recent Labs  Lab 08/19/18 1502 08/20/18 0433 08/22/18 0423  WBC 23.6* 16.8* 13.3*  NEUTROABS 21.4*  --   --   HGB 12.7 11.8* 11.3*  HCT 42.5 38.9 36.9  MCV 92.2 89.6 89.3  PLT 364 323 391   Cardiac Enzymes: Recent Labs  Lab 08/19/18 1502  TROPONINI <0.03   BNP (last 3 results) Recent Labs    08/19/18 1502  BNP 121.0*      Recent Results (from the past 240 hour(s))  Blood Culture (routine x 2)     Status: Abnormal (Preliminary result)   Collection Time: 08/19/18  3:02  PM  Result Value Ref Range Status   Specimen Description   Final    BLOOD BLOOD RIGHT FOREARM Performed at [facility], [address] [place], [place] [address]    Special Requests   Final    BOTTLES DRAWN AEROBIC AND ANAEROBIC Blood Culture results may not be optimal due to an excessive volume of blood received in culture bottles Performed at [facility], [address], [place], [place] [address]    Culture  Setup Time   Final    GRAM NEGATIVE RODS IN BOTH AEROBIC AND ANAEROBIC BOTTLES CRITICAL RESULT CALLED TO, READ BACK BY AND VERIFIED WITH: DAVID BESANTI AT 0981 08/20/18.PMH    Culture (A)  Final    KLEBSIELLA PNEUMONIAE REPEATING SUSCEPTIBILITIES Performed at  [facility], [address], [place], [place] [address]    Report Status PENDING  Incomplete  Blood Culture (routine x 2)     Status: Abnormal (Preliminary result)   Collection Time: 08/19/18  3:02 PM  Result Value Ref Range Status   Specimen Description   Final    BLOOD BLOOD LEFT FOREARM Performed at [facility], [address], [place], [place] [address]    Special Requests   Final    BOTTLES DRAWN AEROBIC AND ANAEROBIC Blood Culture results may not be optimal due to an excessive volume of blood received in culture bottles Performed at [facility], [address] [place], [place] [address]    Culture  Setup Time   Final    GRAM NEGATIVE RODS AEROBIC BOTTLE ONLY CRITICAL RESULT CALLED TO, READ BACK BY AND VERIFIED WITH: DAVID BESANTI AT 6578 08/20/18.PMH Performed at [facility], [address] [place], [place] [address]    Culture KLEBSIELLA PNEUMONIAE (A)  Final   Report Status PENDING  Incomplete  Blood Culture ID Panel (Reflexed)     Status: Abnormal   Collection Time: 08/19/18  3:02 PM  Result Value Ref Range Status   Enterococcus species NOT DETECTED NOT DETECTED Final   Listeria monocytogenes NOT DETECTED NOT DETECTED Final   Staphylococcus species NOT DETECTED NOT DETECTED Final   Staphylococcus aureus (BCID) NOT DETECTED NOT DETECTED Final   Streptococcus species NOT DETECTED NOT DETECTED Final   Streptococcus agalactiae NOT DETECTED NOT DETECTED Final   Streptococcus pneumoniae NOT DETECTED NOT DETECTED Final   Streptococcus pyogenes NOT DETECTED NOT DETECTED Final   Acinetobacter baumannii NOT DETECTED NOT DETECTED Final   Enterobacteriaceae species DETECTED (A) NOT DETECTED Final    Comment: Enterobacteriaceae represent a large family of gram-negative bacteria, not a single organism. CRITICAL RESULT CALLED TO, READ BACK BY AND VERIFIED WITH: DAVID BESANTI AT 9528 08/20/18.PMH    Enterobacter cloacae complex NOT DETECTED  NOT DETECTED Final   Escherichia coli NOT DETECTED NOT DETECTED Final   Klebsiella oxytoca NOT DETECTED NOT DETECTED Final   Klebsiella pneumoniae DETECTED (A) NOT DETECTED Final    Comment: CRITICAL RESULT CALLED TO, READ BACK BY AND VERIFIED WITH: DAVID BESANTI AT 4132 08/20/18.PMH    Proteus species NOT DETECTED NOT DETECTED Final   Serratia marcescens NOT DETECTED NOT DETECTED Final   Carbapenem resistance NOT DETECTED NOT DETECTED Final   Haemophilus influenzae NOT DETECTED NOT DETECTED Final   Neisseria meningitidis NOT DETECTED NOT DETECTED Final   Pseudomonas aeruginosa NOT DETECTED NOT DETECTED Final   Candida albicans NOT DETECTED NOT DETECTED Final   Candida glabrata NOT DETECTED NOT DETECTED Final   Candida krusei NOT DETECTED NOT  DETECTED Final   Candida parapsilosis NOT DETECTED NOT DETECTED Final   Candida tropicalis NOT DETECTED NOT DETECTED Final    Comment: Performed at [facility], [address] [place], [place] [address]     Studies: Dg C-arm 1-60 Min-no Report  Result Date: 08/23/2018 Fluoroscopy was utilized by the requesting physician.  No radiographic interpretation.    Scheduled Meds: . amiodarone  200 mg Oral Daily  . carbidopa-levodopa  1 tablet Oral TID  . carvedilol  3.125 mg Oral BID WC  . ferrous sulfate  325 mg Oral Daily  . furosemide  20 mg Oral Daily  . indomethacin      . Melatonin  5 mg Oral QHS  . multivitamin with minerals  1 tablet Oral Daily  . pantoprazole  40 mg Oral BID  . PARoxetine  30 mg Oral Daily  . potassium chloride SA  20 mEq Oral Daily  . senna  1 tablet Oral Daily  . traZODone  25 mg Oral QHS  . warfarin  2.5 mg Oral ONCE-1800  . Warfarin - Pharmacist Dosing Inpatient   Does not apply q1800   Continuous Infusions: . sodium chloride Stopped (08/22/18 0950)  . ceFEPime (MAXIPIME) IV 2 g (08/23/18 0940)  . [START ON 08/24/2018] cefTRIAXone (ROCEPHIN)  IV      Assessment/Plan:  1.  Sepsis with  Klebsiella.  Likely from cholangitis and choledocholithiasis.  Patient had ERCP with removal of stones and biliary sphincterotomy.  Still waiting for sensitivities on the Klebsiella.  Patient on cefepime. 2.  Elevated liver function test.  From cholangitis and choledocholithiasis. 3.  Paroxysmal atrial fibrillation on amiodarone.  Restart Coumadin.  Since had sphincterotomy today I will not give Lovenox injections.  Check INR tomorrow. 4.  GERD on Protonix 5.  Anxiety on Paxil 6.  History of stroke with left-sided weakness and bedbound. 7.  Patient is a DNR    Code Status:     Code Status Orders  (From admission, onward)         Start     Ordered   08/19/18 1806  Do not attempt resuscitation (DNR)  Continuous    Question Answer Comment  In the event of cardiac or respiratory ARREST Do not call a "code blue"   In the event of cardiac or respiratory ARREST Do not perform Intubation, CPR, defibrillation or ACLS   In the event of cardiac or respiratory ARREST Use medication by any route, position, wound care, and other measures to relive pain and suffering. May use oxygen, suction and manual treatment of airway obstruction as needed for comfort.      08/19/18 1806        Code Status History    Date Active Date Inactive Code Status Order ID Comments User Context   08/10/2018 0111 08/12/2018 2135 DNR [id number]  Cammy Copa, MD Inpatient   11/20/2016 0904 11/21/2016 1854 DNR [id number]  Arnaldo Natal, MD Inpatient   10/24/2016 [phone] 10/30/2016 2126 DNR [id number]  Shaune Pollack, MD Inpatient   09/28/2016 1555 10/02/2016 1523 DNR [id number]  Altamese Dilling, MD Inpatient    Advance Directive Documentation     Most Recent Value  Type of Advance Directive  Healthcare Power of Attorney  Pre-existing out of facility DNR order (yellow form or pink MOST form)  -  "MOST" Form in Place?  -     Family Communication: Spoke with daughter at the bedside this morning Disposition Plan: Need to  have sensitivities back on  blood culture prior to disposition  Antibiotics:  Cefepime  Time spent: 26 minutes.  Lilygrace Rodick [employer]

## 2018-08-23 NOTE — Anesthesia Postprocedure Evaluation (Signed)
Anesthesia Post Note  Patient: Mikayla [name]  Procedure(s) Performed: ENDOSCOPIC RETROGRADE CHOLANGIOPANCREATOGRAPHY (ERCP) (N/A )  Patient location during evaluation: PACU Anesthesia Type: General Level of consciousness: awake and alert Pain management: pain level controlled Vital Signs Assessment: post-procedure vital signs reviewed and stable Respiratory status: spontaneous breathing, nonlabored ventilation, respiratory function stable and patient connected to nasal cannula oxygen Cardiovascular status: blood pressure returned to baseline and stable Postop Assessment: no apparent nausea or vomiting Anesthetic complications: no     Last Vitals:  Vitals:   08/23/18 1339 08/23/18 1354  BP:  138/77  Pulse: 67 64  Resp: 14 15  Temp:  36.9 C  SpO2: 99% 95%    Last Pain:  Vitals:   08/23/18 1354  TempSrc:   PainSc: 0-No pain                 Cleda Mccreedy Piscitello

## 2018-08-23 NOTE — Anesthesia Preprocedure Evaluation (Addendum)
Anesthesia Evaluation  Patient identified by MRN, date of birth, ID band Patient awake    Reviewed: Allergy & Precautions, H&P , NPO status , Patient's Chart, lab work & pertinent test results  History of Anesthesia Complications Negative for: history of anesthetic complications  Airway Mallampati: III  TM Distance: <3 FB Neck ROM: limited    Dental  (+) Chipped, Poor Dentition, Missing   Pulmonary neg shortness of breath, COPD,  COPD inhaler and oxygen dependent, former smoker,           Cardiovascular Exercise Tolerance: Good hypertension, (-) angina(-) Past MI + dysrhythmias Atrial Fibrillation      Neuro/Psych CVA, Residual Symptoms negative psych ROS   GI/Hepatic negative GI ROS, Neg liver ROS,   Endo/Other  negative endocrine ROS  Renal/GU      Musculoskeletal   Abdominal   Peds  Hematology negative hematology ROS (+)   Anesthesia Other Findings Past Medical History: No date: Atrial fibrillation (HCC) No date: Empy[facility])     Comment:  in [facility] Aug 2017- stayed in hospital on for 2 months. No date: Hypertension  Past Surgical History: No date: PEG TUBE PLACEMENT  BMI    Body Mass Index:  33.57 kg/m      Reproductive/Obstetrics negative OB ROS                            Anesthesia Physical Anesthesia Plan  ASA: IV  Anesthesia Plan: General ETT   Post-op Pain Management:    Induction: Intravenous  PONV Risk Score and Plan: Ondansetron, Dexamethasone, Midazolam and Treatment may vary due to age or medical condition  Airway Management Planned: Oral ETT and Video Laryngoscope Planned  Additional Equipment:   Intra-op Plan:   Post-operative Plan: Extubation in OR and Possible Post-op intubation/ventilation  Informed Consent: I have reviewed the patients History and Physical, chart, labs and discussed the procedure including the risks, benefits and alternatives for  the proposed anesthesia with the patient or authorized representative who has indicated his/her understanding and acceptance.   Dental Advisory Given  Plan Discussed with: Anesthesiologist, CRNA and Surgeon  Anesthesia Plan Comments: (Plan to suspend DNR for procedure.  Patient and family voiced understanding.  Patient informed that they are higher risk for complications from anesthesia during this procedure due to their medical history.  Patient voiced understanding.  Patient consented for risks of anesthesia including but not limited to:  - adverse reactions to medications - damage to teeth, lips or other oral mucosa - sore throat or hoarseness - Damage to heart, brain, lungs or loss of life  Patient voiced understanding.)        Anesthesia Quick Evaluation

## 2018-08-23 NOTE — Op Note (Signed)
[facility] Patient Name: Mikayla [name] Procedure Date: 08/23/2018 12:16 PM MRN: [id number] Account #: [id number] Date of Birth: 1941-06-12 Admit Type: Inpatient Age: 77 Room: [facility] ENDO ROOM 4 Gender: Female Note Status: Finalized Procedure:            ERCP Indications:          Bile duct stone(s), Ascending cholangitis Providers:            Midge Minium MD, MD Referring MD:         No Local Md, MD (Referring MD) Medicines:            General Anesthesia Complications:        No immediate complications. Procedure:            Pre-Anesthesia Assessment:                       - Prior to the procedure, a History and Physical was                        performed, and patient medications and allergies were                        reviewed. The patient's tolerance of previous                        anesthesia was also reviewed. The risks and benefits of                        the procedure and the sedation options and risks were                        discussed with the patient. All questions were                        answered, and informed consent was obtained. Prior                        Anticoagulants: The patient has taken Coumadin                        (warfarin), last dose was 4 days prior to procedure.                        ASA Grade Assessment: III - A patient with severe                        systemic disease. After reviewing the risks and                        benefits, the patient was deemed in satisfactory                        condition to undergo the procedure.                       After obtaining informed consent, the scope was passed                        under direct vision. Throughout the procedure, the  patient's blood pressure, pulse, and oxygen saturations                        were monitored continuously. The Duodenoscope was                        introduced through the mouth, and used to inject                         contrast into and used to inject contrast into the bile                        duct. The ERCP was accomplished without difficulty. The                        patient tolerated the procedure well. Findings:      The scout film was normal. The esophagus was successfully intubated       under direct vision. The scope was advanced to a normal major papilla in       the descending duodenum without detailed examination of the pharynx,       larynx and associated structures, and upper GI tract. The upper GI tract       was grossly normal. The bile duct was deeply cannulated with the       short-nosed traction sphincterotome. Contrast was injected. I personally       interpreted the bile duct images. There was brisk flow of contrast       through the ducts. Image quality was excellent. Contrast extended to the       entire biliary tree. The main bile duct contained 7. The main bile duct       was diffusely dilated. A wire was passed into the biliary tree. A 10 mm       biliary sphincterotomy was made with a traction (standard)       sphincterotome using ERBE electrocautery. There was no       post-sphincterotomy bleeding. The biliary tree was swept with a 15 mm       balloon starting at the bifurcation. All stones were removed. Impression:           - The entire main bile duct was dilated.                       - Choledocholithiasis was found. Complete removal was                        accomplished by biliary sphincterotomy and balloon                        extraction.                       - A biliary sphincterotomy was performed.                       - The biliary tree was swept. Recommendation:       - Return patient to hospital ward for ongoing care. Procedure Code(s):    --- Professional ---                       [id number], Endoscopic retrograde cholangiopancreatography                        (  ERCP); with removal of calculi/debris from                         biliary/pancreatic duct(s)                       43262, Endoscopic retrograde cholangiopancreatography                        (ERCP); with sphincterotomy/papillotomy                       [phone], Endoscopic catheterization of the biliary ductal                        system, radiological supervision and interpretation Diagnosis Code(s):    --- Professional ---                       K83.09, Other cholangitis                       K80.30, Calculus of bile duct with cholangitis,                        unspecified, without obstruction                       K83.8, Other specified diseases of biliary tract CPT copyright 2018 American Medical Association. All rights reserved. The codes documented in this report are preliminary and upon coder review may  be revised to meet current compliance requirements. Midge Minium MD, MD 08/23/2018 12:52:16 PM This report has been signed electronically. Number of Addenda: 0 Note Initiated On: 08/23/2018 12:16 PM      [facility]

## 2018-08-23 NOTE — Progress Notes (Addendum)
ANTICOAGULATION CONSULT NOTE - Follow Up Consult  Pharmacy Consult for warfarin dosing.  Indication: atrial fibrillation  Allergies  Allergen Reactions  . Levaquin [Levofloxacin]   . Other Nausea And Vomiting  . Sulfa Antibiotics Hives  . Oxycodone Anxiety and Other (See Comments)    Patient Measurements: Height: 5\' 4"  (162.6 cm) Weight: 195 lb 8.8 oz (88.7 kg) IBW/kg (Calculated) : 54.7   Vital Signs: Temp: 98 F (36.7 C) (10/29 0541) Temp Source: Oral (10/29 0541) BP: 134/89 (10/29 0541) Pulse Rate: 74 (10/29 1119)  Labs: Recent Labs    08/21/18 0436 08/21/18 0804 08/22/18 0423 08/23/18 0519  HGB  --   --  11.3*  --   HCT  --   --  36.9  --   PLT  --   --  391  --   LABPROT  --  21.9* 20.1* 14.8  INR  --  1.94 1.74 1.17  CREATININE 0.77  --  0.78  --     Estimated Creatinine Clearance: 63.5 mL/min (by C-G formula based on SCr of 0.78 mg/dL).   Medications:  Scheduled:  . [MAR Hold] amiodarone  200 mg Oral Daily  . [MAR Hold] carbidopa-levodopa  1 tablet Oral TID  . [MAR Hold] carvedilol  3.125 mg Oral BID WC  . [MAR Hold] ferrous sulfate  325 mg Oral Daily  . [MAR Hold] furosemide  20 mg Oral Daily  . indomethacin      . [MAR Hold] Melatonin  5 mg Oral QHS  . [MAR Hold] methylPREDNISolone (SOLU-MEDROL) injection  40 mg Intravenous Once  . [MAR Hold] multivitamin with minerals  1 tablet Oral Daily  . [MAR Hold] pantoprazole  40 mg Oral BID  . [MAR Hold] PARoxetine  30 mg Oral Daily  . [MAR Hold] potassium chloride SA  20 mEq Oral Daily  . [MAR Hold] senna  1 tablet Oral Daily  . [MAR Hold] traZODone  25 mg Oral QHS   Infusions:  . [MAR Hold] sodium chloride Stopped (08/22/18 0950)  . [MAR Hold] ceFEPime (MAXIPIME) IV 2 g (08/23/18 0940)   PRN: [UJW Hold] sodium chloride, [MAR Hold] acetaminophen **OR** [MAR Hold] acetaminophen, [MAR Hold] ipratropium-albuterol, [MAR Hold] ondansetron **OR** [MAR Hold] ondansetron (ZOFRAN) IV  Assessment: INR is  1.17. Home dose is 5 mg daily per med rec. Will restart warfarin tonight per Dr. Renae Gloss orders. Patient is also on amio will decrease warfarin by 50%. Order Daily INR and CBC with AM labs. DDI: cefepime, amio, paroxetine (inc bleeding), trazodone (inc bleeding)  Goal of Therapy:  INR 2-3 Monitor platelets by anticoagulation protocol: Yes   Plan:  Date INR Warfarin Dose  10/29 1.17 2.5 mg         Paschal Dopp, PharmD  08/23/2018,12:28 PM

## 2018-08-23 NOTE — Transfer of Care (Signed)
Immediate Anesthesia Transfer of Care Note  Patient: Mikayla [name]  Procedure(s) Performed: ENDOSCOPIC RETROGRADE CHOLANGIOPANCREATOGRAPHY (ERCP) (N/A )  Patient Location: PACU  Anesthesia Type:General  Level of Consciousness: awake  Airway & Oxygen Therapy: Patient Spontanous Breathing and Patient connected to face mask oxygen  Post-op Assessment: Report given to RN and Post -op Vital signs reviewed and stable  Post vital signs: Reviewed  Last Vitals:  Vitals Value Taken Time  BP 154/64 08/23/2018  1:09 PM  Temp 37 C 08/23/2018  1:09 PM  Pulse 67 08/23/2018  1:09 PM  Resp 12 08/23/2018  1:09 PM  SpO2 100 % 08/23/2018  1:09 PM  Vitals shown include unvalidated device data.  Last Pain:  Vitals:   08/23/18 0541  TempSrc: Oral  PainSc:       Patients Stated Pain Goal: 0 (08/22/18 1522)  Complications: No apparent anesthesia complications

## 2018-08-23 NOTE — Anesthesia Post-op Follow-up Note (Signed)
Anesthesia QCDR form completed.        

## 2018-08-24 ENCOUNTER — Inpatient Hospital Stay: Payer: Self-pay

## 2018-08-24 ENCOUNTER — Encounter: Payer: Self-pay | Admitting: Gastroenterology

## 2018-08-24 ENCOUNTER — Inpatient Hospital Stay: Payer: Medicare Other

## 2018-08-24 LAB — LIPASE, BLOOD: Lipase: 21 U/L (ref 11–51)

## 2018-08-24 LAB — COMPREHENSIVE METABOLIC PANEL
ALT: 30 U/L (ref 0–44)
ANION GAP: 7 (ref 5–15)
AST: 33 U/L (ref 15–41)
Albumin: 2.8 g/dL — ABNORMAL LOW (ref 3.5–5.0)
Alkaline Phosphatase: 266 U/L — ABNORMAL HIGH (ref 38–126)
BUN: 15 mg/dL (ref 8–23)
CO2: 33 mmol/L — AB (ref 22–32)
Calcium: 8.6 mg/dL — ABNORMAL LOW (ref 8.9–10.3)
Chloride: 97 mmol/L — ABNORMAL LOW (ref 98–111)
Creatinine, Ser: 0.76 mg/dL (ref 0.44–1.00)
GFR calc non Af Amer: >60 mL/min (ref >60)
Glucose, Bld: 140 mg/dL — ABNORMAL HIGH (ref 70–99)
Potassium: 3.9 mmol/L (ref 3.5–5.1)
Sodium: 137 mmol/L (ref 135–145)
TOTAL PROTEIN: 6.8 g/dL (ref 6.5–8.1)
Total Bilirubin: 1.2 mg/dL (ref 0.3–1.2)

## 2018-08-24 LAB — CBC
HEMATOCRIT: 38.8 % (ref 36.0–46.0)
Hemoglobin: 11.7 g/dL — ABNORMAL LOW (ref 12.0–15.0)
MCH: 27.6 pg (ref 26.0–34.0)
MCHC: 30.2 g/dL (ref 30.0–36.0)
MCV: 91.5 fL (ref 80.0–100.0)
NRBC: 0 % (ref 0.0–0.2)
PLATELETS: 418 10*3/uL — AB (ref 150–400)
RBC: 4.24 MIL/uL (ref 3.87–5.11)
RDW: 16.5 % — ABNORMAL HIGH (ref 11.5–15.5)
WBC: 9.9 10*3/uL (ref 4.0–10.5)

## 2018-08-24 LAB — PROTIME-INR
INR: 1.09
PROTHROMBIN TIME: 14 s (ref 11.4–15.2)

## 2018-08-24 LAB — CULTURE, BLOOD (ROUTINE X 2)

## 2018-08-24 MED ORDER — SODIUM CHLORIDE 0.9 % IV SOLN
1.0000 g | INTRAVENOUS | Status: DC
  Administered 2018-08-24: 18:00:00 1000 mg via INTRAVENOUS
  Filled 2018-08-24 (×3): qty 1

## 2018-08-24 MED ORDER — ENOXAPARIN SODIUM 100 MG/ML ~~LOC~~ SOLN
1.0000 mg/kg | Freq: Two times a day (BID) | SUBCUTANEOUS | Status: DC
  Administered 2018-08-24 – 2018-08-25 (×2): 90 mg via SUBCUTANEOUS
  Filled 2018-08-24 (×3): qty 1

## 2018-08-24 MED ORDER — SODIUM CHLORIDE 0.9% FLUSH
10.0000 mL | Freq: Two times a day (BID) | INTRAVENOUS | Status: DC
  Administered 2018-08-24 – 2018-08-25 (×2): 10 mL

## 2018-08-24 MED ORDER — BUTALBITAL-APAP-CAFFEINE 50-325-40 MG PO TABS
1.0000 | ORAL_TABLET | Freq: Four times a day (QID) | ORAL | Status: DC | PRN
  Administered 2018-08-24 – 2018-08-25 (×2): 1 via ORAL
  Filled 2018-08-24 (×3): qty 1

## 2018-08-24 MED ORDER — SODIUM CHLORIDE 0.9% FLUSH: 10.0000 mL | INTRAVENOUS | Status: DC | PRN

## 2018-08-24 MED ORDER — WARFARIN SODIUM 5 MG PO TABS
5.0000 mg | ORAL_TABLET | Freq: Once | ORAL | Status: AC
  Administered 2018-08-24: 5 mg via ORAL
  Filled 2018-08-24: qty 1

## 2018-08-24 MED ORDER — SODIUM CHLORIDE 0.9 % IV SOLN
1.0000 g | Freq: Three times a day (TID) | INTRAVENOUS | Status: DC
  Administered 2018-08-24: 10:00:00 1 g via INTRAVENOUS
  Filled 2018-08-24 (×3): qty 1

## 2018-08-24 MED ORDER — ENOXAPARIN SODIUM 100 MG/ML ~~LOC~~ SOLN
1.0000 mg/kg | Freq: Two times a day (BID) | SUBCUTANEOUS | Status: DC
  Filled 2018-08-24: qty 1

## 2018-08-24 NOTE — Progress Notes (Signed)
Peripherally Inserted Central Catheter/Midline Placement  The IV Nurse has discussed with the patient and/or persons authorized to consent for the patient, the purpose of this procedure and the potential benefits and risks involved with this procedure.  The benefits include less needle sticks, lab draws from the catheter, and the patient may be discharged home with the catheter. Risks include, but not limited to, infection, bleeding, blood clot (thrombus formation), and puncture of an artery; nerve damage and irregular heartbeat and possibility to perform a PICC exchange if needed/ordered by physician.  Alternatives to this procedure were also discussed.  Bard Power PICC patient education guide, fact sheet on infection prevention and patient information card has been provided to patient /or left at bedside.    PICC/Midline Placement Documentation  PICC Single Lumen 08/24/18 PICC Right Cephalic 38 cm 0 cm (Active)  Indication for Insertion or Continuance of Line Prolonged intravenous therapies 08/24/2018  3:40 PM  Exposed Catheter (cm) 0 cm 08/24/2018  3:40 PM  Site Assessment Clean;Dry;Intact 08/24/2018  3:40 PM  Line Status Flushed;Blood return noted 08/24/2018  3:40 PM  Dressing Type Transparent 08/24/2018  3:40 PM  Dressing Status Clean;Dry;Intact;Antimicrobial disc in place 08/24/2018  3:40 PM  Dressing Intervention New dressing 08/24/2018  3:40 PM  Dressing Change Due 08/31/18 08/24/2018  3:40 PM   Daughter signed consent at bedside per pt request    Maximino Greenland 08/24/2018, 3:41 PM

## 2018-08-24 NOTE — Care Management Important Message (Signed)
Important Message  Patient Details  Name: CAMYRA VAETH MRN: [id number] Date of Birth: Oct 23, 1941   Medicare Important Message Given:  Yes    Olegario Messier A Kerry Chisolm 08/24/2018, 10:34 AM

## 2018-08-24 NOTE — Progress Notes (Signed)
ANTICOAGULATION CONSULT NOTE - Follow Up Consult  Pharmacy Consult for warfarin dosing.  Indication: atrial fibrillation  Allergies  Allergen Reactions  . Levaquin [Levofloxacin]   . Other Nausea And Vomiting  . Sulfa Antibiotics Hives  . Oxycodone Anxiety and Other (See Comments)    Patient Measurements: Height: 5\' 4"  (162.6 cm) Weight: 195 lb 8.8 oz (88.7 kg) IBW/kg (Calculated) : 54.7   Vital Signs: Temp: 97.6 F (36.4 C) (10/30 0439) Temp Source: Oral (10/30 0439) BP: 157/59 (10/30 0439) Pulse Rate: 58 (10/30 0439)  Labs: Recent Labs    08/22/18 0423 08/23/18 0519 08/24/18 0507  HGB 11.3*  --   --   HCT 36.9  --   --   PLT 391  --   --   LABPROT 20.1* 14.8 14.0  INR 1.74 1.17 1.09  CREATININE 0.78  --  0.76    Estimated Creatinine Clearance: 63.5 mL/min (by C-G formula based on SCr of 0.76 mg/dL).   Medications:  Scheduled:  . amiodarone  200 mg Oral Daily  . carbidopa-levodopa  1 tablet Oral TID  . carvedilol  3.125 mg Oral BID WC  . ferrous sulfate  325 mg Oral Daily  . furosemide  20 mg Oral Daily  . Melatonin  5 mg Oral QHS  . multivitamin with minerals  1 tablet Oral Daily  . pantoprazole  40 mg Oral BID  . PARoxetine  30 mg Oral Daily  . potassium chloride SA  20 mEq Oral Daily  . senna  1 tablet Oral Daily  . traZODone  25 mg Oral QHS  . Warfarin - Pharmacist Dosing Inpatient   Does not apply q1800   Infusions:  . sodium chloride Stopped (08/22/18 0950)  . cefTRIAXone (ROCEPHIN)  IV     PRN: sodium chloride, acetaminophen **OR** acetaminophen, ipratropium-albuterol, ondansetron **OR** ondansetron (ZOFRAN) IV  Assessment: INR below goal range at 1.09. Home dose is 5 mg daily per med rec. Home dose is 5 mg daily. Patient is also on amiodarone, warfarin was decreased by 50% yesterady. Order Daily INR and CBC ordered for today and with AM labs. DDI: cefepime, amio, paroxetine (inc bleeding), trazodone (inc bleeding) Date INR Warfarin Dose   10/29 1.17 2.5 mg   10/30 1.09 5 mg    Goal of Therapy:  INR 2-3 Monitor platelets by anticoagulation protocol: Yes   Plan:  Will give a 5 mg dose tonight.   Paschal Dopp, PharmD  08/24/2018,7:47 AM

## 2018-08-24 NOTE — Progress Notes (Signed)
ANTICOAGULATION CONSULT NOTE - Initial Consult  Pharmacy Consult for enoxaparin Indication: AF with subtherapeutic INR  Allergies  Allergen Reactions  . Levaquin [Levofloxacin]   . Other Nausea And Vomiting  . Sulfa Antibiotics Hives  . Oxycodone Anxiety and Other (See Comments)    Patient Measurements: Height: 5\' 4"  (162.6 cm) Weight: 195 lb 8.8 oz (88.7 kg) IBW/kg (Calculated) : 54.7 Heparin Dosing Weight:   Vital Signs: Temp: 97.6 F (36.4 C) (10/30 0439) Temp Source: Oral (10/30 0439) BP: 157/59 (10/30 0439) Pulse Rate: 58 (10/30 0439)  Labs: Recent Labs    08/22/18 0423 08/23/18 0519 08/24/18 0507  HGB 11.3*  --  11.7*  HCT 36.9  --  38.8  PLT 391  --  418*  LABPROT 20.1* 14.8 14.0  INR 1.74 1.17 1.09  CREATININE 0.78  --  0.76    Estimated Creatinine Clearance: 63.5 mL/min (by C-G formula based on SCr of 0.76 mg/dL).   Medical History: Past Medical History:  Diagnosis Date  . Atrial fibrillation (HCC)   . Empyema [facility])    in [facility] Aug 2017- stayed in hospital on for 2 months.  . Hypertension     Medications:  Infusions:  . sodium chloride Stopped (08/22/18 0950)  . ertapenem      Assessment: 76 yof cc sepsis now s/p sphincterotomy POD1 - pharmacy consulted to bridge to therapeutic INR with LMWH.   Goal of Therapy:  Anti-Xa level 0.6-1 units/ml 4hrs after LMWH dose given Monitor platelets by anticoagulation protocol: Yes   Plan:  Enoxaparin 1 mg/kg subcutaneously twice daily until INR therapeutic (x 2 is recommended)  Carola Frost, Pharm.D., BCPS Clinical Pharmacist 08/24/2018,3:24 PM

## 2018-08-24 NOTE — Progress Notes (Signed)
Patient ID: Mikayla [name], female   DOB: [birth year], 77 y.o.   MRN: [id number]   [facility] PROGRESS NOTE  Mikayla [name] WUJ:[id number] DOB: 1941/01/17 DOA: 08/19/2018 PCP: System, Pcp Not In  HPI/Subjective: Patient feeling okay.  Offers no complaints.  Right ankle better after steroid injection yesterday for likely gout.  Objective: Vitals:   08/23/18 2102 08/24/18 0439  BP: (!) 139/54 (!) 157/59  Pulse: 66 (!) 58  Resp:    Temp: 98 F (36.7 C) 97.6 F (36.4 C)  SpO2: 96% 100%    Filed Weights   08/19/18 1452  Weight: 88.7 kg    ROS: Review of Systems  Constitutional: Negative for chills and fever.  Eyes: Negative for blurred vision.  Respiratory: Negative for cough and shortness of breath.   Cardiovascular: Negative for chest pain.  Gastrointestinal: Negative for abdominal pain, constipation, diarrhea, nausea and vomiting.  Genitourinary: Negative for dysuria.  Musculoskeletal: Negative for joint pain.  Neurological: Negative for dizziness and headaches.   Exam: Physical Exam  Constitutional: She is oriented to person, place, and time.  HENT:  Nose: No mucosal edema.  Mouth/Throat: No oropharyngeal exudate or posterior oropharyngeal edema.  Eyes: Pupils are equal, round, and reactive to light. Conjunctivae, EOM and lids are normal.  Neck: No JVD present. Carotid bruit is not present. No edema present. No thyroid mass and no thyromegaly present.  Cardiovascular: S1 normal and S2 normal. Exam reveals no gallop.  No murmur heard. Pulses:      Dorsalis pedis pulses are 2+ on the right side, and 2+ on the left side.  Respiratory: No respiratory distress. She has no wheezes. She has no rhonchi. She has no rales.  GI: Soft. Bowel sounds are normal. There is no tenderness.  Musculoskeletal:       Right ankle: She exhibits swelling.       Left ankle: She exhibits swelling.  Lymphadenopathy:    She has no cervical adenopathy.  Neurological: She is  alert and oriented to person, place, and time. No cranial nerve deficit.  Skin: Skin is warm. No rash noted. Nails show no clubbing.  Psychiatric: She has a normal mood and affect.      Data Reviewed: Basic Metabolic Panel: Recent Labs  Lab 08/19/18 1502 08/20/18 0433 08/21/18 0436 08/22/18 0423 08/24/18 0507  NA 140 140 139 138 137  K 4.5 4.1 3.5 3.5 3.9  CL 93* 94* 94* 94* 97*  CO2 35* 34* 31 33* 33*  GLUCOSE 149* 212* 128* 112* 140*  BUN 13 18 17 13 15   CREATININE 0.77 0.78 0.77 0.78 0.76  CALCIUM 8.7* 8.8* 8.7* 8.6* 8.6*   Liver Function Tests: Recent Labs  Lab 08/19/18 1502 08/20/18 0433 08/21/18 0436 08/22/18 0423 08/24/18 0507  AST 163* 101* 47* 43* 33  ALT 61* 118* 77* 39 30  ALKPHOS 573* 519* 373* 309* 266*  BILITOT 4.6* 2.8* 1.4* 1.7* 1.2  PROT 7.4 7.4 6.8 6.6 6.8  ALBUMIN 3.0* 2.8* 2.7* 2.8* 2.8*   CBC: Recent Labs  Lab 08/19/18 1502 08/20/18 0433 08/22/18 0423 08/24/18 0507  WBC 23.6* 16.8* 13.3* 9.9  NEUTROABS 21.4*  --   --   --   HGB 12.7 11.8* 11.3* 11.7*  HCT 42.5 38.9 36.9 38.8  MCV 92.2 89.6 89.3 91.5  PLT 364 323 391 418*   Cardiac Enzymes: Recent Labs  Lab 08/19/18 1502  TROPONINI <0.03   BNP (last 3 results) Recent Labs    08/19/18 1502  BNP 121.0*      Recent Results (from the past 240 hour(s))  Blood Culture (routine x 2)     Status: Abnormal   Collection Time: 08/19/18  3:02 PM  Result Value Ref Range Status   Specimen Description   Final    BLOOD BLOOD RIGHT FOREARM Performed at [facility], [address], [place], [place] [address]    Special Requests   Final    BOTTLES DRAWN AEROBIC AND ANAEROBIC Blood Culture results may not be optimal due to an excessive volume of blood received in culture bottles Performed at [facility], [address], [place], [place] [address]    Culture  Setup Time   Final    GRAM NEGATIVE RODS IN BOTH AEROBIC AND ANAEROBIC BOTTLES CRITICAL RESULT CALLED TO,  READ BACK BY AND VERIFIED WITH: DAVID BESANTI AT 0981 08/20/18.PMH Performed at [facility], [address], [place], [place] [address]    Culture (A)  Final    KLEBSIELLA PNEUMONIAE MULTI-DRUG RESISTANT ORGANISM    Report Status 08/24/2018 FINAL  Final   Organism ID, Bacteria KLEBSIELLA PNEUMONIAE  Final      Susceptibility   Klebsiella pneumoniae - MIC*    AMPICILLIN >=32 RESISTANT Resistant     CEFAZOLIN >=64 RESISTANT Resistant     CEFEPIME RESISTANT Resistant     CEFTAZIDIME RESISTANT Resistant     CEFTRIAXONE RESISTANT Resistant     CIPROFLOXACIN >=4 RESISTANT Resistant     GENTAMICIN <=1 SENSITIVE Sensitive     IMIPENEM 0.5 SENSITIVE Sensitive     TRIMETH/SULFA >=320 RESISTANT Resistant     AMPICILLIN/SULBACTAM >=32 RESISTANT Resistant     PIP/TAZO RESISTANT Resistant     Extended ESBL NEGATIVE Sensitive     * KLEBSIELLA PNEUMONIAE  Blood Culture (routine x 2)     Status: Abnormal   Collection Time: 08/19/18  3:02 PM  Result Value Ref Range Status   Specimen Description   Final    BLOOD BLOOD LEFT FOREARM Performed at [facility], [address], [place], [place] [address]    Special Requests   Final    BOTTLES DRAWN AEROBIC AND ANAEROBIC Blood Culture results may not be optimal due to an excessive volume of blood received in culture bottles Performed at [facility], [address] [place], [place] [address]    Culture  Setup Time   Final    GRAM NEGATIVE RODS AEROBIC BOTTLE ONLY CRITICAL RESULT CALLED TO, READ BACK BY AND VERIFIED WITH: DAVID BESANTI AT 6578 08/20/18.PMH Performed at [facility], [address] [place], [place] [address]    Culture (A)  Final    KLEBSIELLA PNEUMONIAE SUSCEPTIBILITIES PERFORMED ON PREVIOUS CULTURE WITHIN THE LAST 5 DAYS. Performed at [facility], [address], [place], [place] [address]    Report Status 08/24/2018 FINAL  Final  Blood Culture ID Panel (Reflexed)     Status:  Abnormal   Collection Time: 08/19/18  3:02 PM  Result Value Ref Range Status   Enterococcus species NOT DETECTED NOT DETECTED Final   Listeria monocytogenes NOT DETECTED NOT DETECTED Final   Staphylococcus species NOT DETECTED NOT DETECTED Final   Staphylococcus aureus (BCID) NOT DETECTED NOT DETECTED Final   Streptococcus species NOT DETECTED NOT DETECTED Final   Streptococcus agalactiae NOT DETECTED NOT DETECTED Final   Streptococcus pneumoniae NOT DETECTED NOT DETECTED Final   Streptococcus pyogenes NOT DETECTED NOT DETECTED Final   Acinetobacter baumannii NOT DETECTED  NOT DETECTED Final   Enterobacteriaceae species DETECTED (A) NOT DETECTED Final    Comment: Enterobacteriaceae represent a large family of gram-negative bacteria, not a single organism. CRITICAL RESULT CALLED TO, READ BACK BY AND VERIFIED WITH: DAVID BESANTI AT 0865 08/20/18.PMH    Enterobacter cloacae complex NOT DETECTED NOT DETECTED Final   Escherichia coli NOT DETECTED NOT DETECTED Final   Klebsiella oxytoca NOT DETECTED NOT DETECTED Final   Klebsiella pneumoniae DETECTED (A) NOT DETECTED Final    Comment: CRITICAL RESULT CALLED TO, READ BACK BY AND VERIFIED WITH: DAVID BESANTI AT 7846 08/20/18.PMH    Proteus species NOT DETECTED NOT DETECTED Final   Serratia marcescens NOT DETECTED NOT DETECTED Final   Carbapenem resistance NOT DETECTED NOT DETECTED Final   Haemophilus influenzae NOT DETECTED NOT DETECTED Final   Neisseria meningitidis NOT DETECTED NOT DETECTED Final   Pseudomonas aeruginosa NOT DETECTED NOT DETECTED Final   Candida albicans NOT DETECTED NOT DETECTED Final   Candida glabrata NOT DETECTED NOT DETECTED Final   Candida krusei NOT DETECTED NOT DETECTED Final   Candida parapsilosis NOT DETECTED NOT DETECTED Final   Candida tropicalis NOT DETECTED NOT DETECTED Final    Comment: Performed at [facility], [address] [place], [place] [address]  Carbapenem Resistance Panel     Status:  None   Collection Time: 08/19/18  3:02 PM  Result Value Ref Range Status   Carba Resistance IMP Gene NOT DETECTED NOT DETECTED Final   Carba Resistance VIM Gene NOT DETECTED NOT DETECTED Final   Carba Resistance NDM Gene NOT DETECTED NOT DETECTED Final   Carba Resistance KPC Gene NOT DETECTED NOT DETECTED Final   Carba Resistance OXA48 Gene NOT DETECTED NOT DETECTED Final    Comment: (NOTE) Cepheid Carba-R is an FDA-cleared nucleic acid amplification test  (NAAT)for the detection and differentiation of genes encoding the  most prevalent carbapenemases in bacterial isolate samples. Carbapenemase gene identification and implementation of comprehensive  infection control measures are recommended by the CDC to prevent the  spread of the resistant organisms. Performed at [facility], [address] [place], [place] [address]      Studies: Dg C-arm 1-60 Min-no Report  Result Date: 08/23/2018 Fluoroscopy was utilized by the requesting physician.  No radiographic interpretation.   [place] Ekg Site Rite  Result Date: 08/24/2018 If Site Rite image not attached, placement could not be confirmed due to current cardiac rhythm.   Scheduled Meds: . amiodarone  200 mg Oral Daily  . carbidopa-levodopa  1 tablet Oral TID  . carvedilol  3.125 mg Oral BID WC  . enoxaparin (LOVENOX) injection  1 mg/kg Subcutaneous BID  . ferrous sulfate  325 mg Oral Daily  . furosemide  20 mg Oral Daily  . Melatonin  5 mg Oral QHS  . multivitamin with minerals  1 tablet Oral Daily  . pantoprazole  40 mg Oral BID  . PARoxetine  30 mg Oral Daily  . potassium chloride SA  20 mEq Oral Daily  . senna  1 tablet Oral Daily  . traZODone  25 mg Oral QHS  . warfarin  5 mg Oral ONCE-1800  . Warfarin - Pharmacist Dosing Inpatient   Does not apply q1800   Continuous Infusions: . sodium chloride Stopped (08/22/18 0950)  . ertapenem      Assessment/Plan:  1.  Sepsis with Klebsiella.  Likely from cholangitis  and choledocholithiasis.  Patient had ERCP with removal of stones and biliary sphincterotomy.  Sensitivities on  the Klebsiella finally came back today and its resistant to the antibiotic that I had her on.  Switch to Invanz and get a PICC line.  We will get first dose of Invanz today.  Likely will need 13 more doses. 2.  Elevated liver function test have normalized.  From cholangitis and choledocholithiasis. 3.  Paroxysmal atrial fibrillation on amiodarone.  Restarted Coumadin.  Bridge Lovenox while INR Low starting tonight since had procedure yesterday. 4.  GERD on Protonix 5.  Anxiety on Paxil 6.  History of stroke with left-sided weakness and bedbound. 7.  Patient is a DNR 8.  Gout right ankle.  No pain today after 1 dose of steroid yesterday.    Code Status:     Code Status Orders  (From admission, onward)         Start     Ordered   08/19/18 1806  Do not attempt resuscitation (DNR)  Continuous    Question Answer Comment  In the event of cardiac or respiratory ARREST Do not call a "code blue"   In the event of cardiac or respiratory ARREST Do not perform Intubation, CPR, defibrillation or ACLS   In the event of cardiac or respiratory ARREST Use medication by any route, position, wound care, and other measures to relive pain and suffering. May use oxygen, suction and manual treatment of airway obstruction as needed for comfort.      08/19/18 1806        Code Status History    Date Active Date Inactive Code Status Order ID Comments User Context   08/10/2018 0111 08/12/2018 2135 DNR [id number]  Cammy Copa, MD Inpatient   11/20/2016 0904 11/21/2016 1854 DNR [id number]  Arnaldo Natal, MD Inpatient   10/24/2016 [phone] 10/30/2016 2126 DNR [id number]  Shaune Pollack, MD Inpatient   09/28/2016 1555 10/02/2016 1523 DNR [id number]  Altamese Dilling, MD Inpatient    Advance Directive Documentation     Most Recent Value  Type of Advance Directive  Healthcare Power of Attorney   Pre-existing out of facility DNR order (yellow form or pink MOST form)  -  "MOST" Form in Place?  -     Family Communication: Spoke with daughter at the bedside. Disposition Plan: PICC line to be placed today.  Social work to look into the facility on IV medications.  Potentially can get out of the hospital tomorrow or the following day if doing okay.  Antibiotics:  Invanz  Time spent: 28 minutes.  Reve Crocket [employer]

## 2018-08-24 NOTE — Progress Notes (Signed)
Mikayla Repress, MD [address]  [address]  [place], [place] [address]  Main: [phone]  Fax: [phone] Pager: [phone]   Subjective: Patient underwent ERCP yesterday, with biliary sphincterotomy and biliary stone extraction.  She denies abdominal pain, nausea today.  No fever reported, tolerating diet well   Objective: Vital signs in last 24 hours: Vitals:   08/23/18 1354 08/23/18 1557 08/23/18 2102 08/24/18 0439  BP: 138/77 (!) 142/57 (!) 139/54 (!) 157/59  Pulse: 64 66 66 (!) 58  Resp: 15 20    Temp: 98.4 F (36.9 C) 98.2 F (36.8 C) 98 F (36.7 C) 97.6 F (36.4 C)  TempSrc:  Oral Oral Oral  SpO2: 95% 99% 96% 100%  Weight:      Height:       Weight change:   Intake/Output Summary (Last 24 hours) at 08/24/2018 1601 Last data filed at 08/24/2018 1335 Gross per 24 hour  Intake 705.06 ml  Output -  Net 705.06 ml     Exam: Heart:: Regular rate and rhythm or S1S2 present Lungs: normal and clear to auscultation Abdomen: soft, nontender, normal bowel sounds   Lab Results: CBC Latest Ref Rng & Units 08/24/2018 08/22/2018 08/20/2018  WBC 4.0 - 10.5 K/uL 9.9 13.3(H) 16.8(H)  Hemoglobin 12.0 - 15.0 g/dL 11.7(L) 11.3(L) 11.8(L)  Hematocrit 36.0 - 46.0 % 38.8 36.9 38.9  Platelets 150 - 400 K/uL 418(H) 391 323   CMP Latest Ref Rng & Units 08/24/2018 08/22/2018 08/21/2018  Glucose 70 - 99 mg/dL 962(X) 528(U) 132(G)  BUN 8 - 23 mg/dL 15 13 17   Creatinine 0.44 - 1.00 mg/dL 4.01 0.27 2.53  Sodium 135 - 145 mmol/L 137 138 139  Potassium 3.5 - 5.1 mmol/L 3.9 3.5 3.5  Chloride 98 - 111 mmol/L 97(L) 94(L) 94(L)  CO2 22 - 32 mmol/L 33(H) 33(H) 31  Calcium 8.9 - 10.3 mg/dL 6.6(Y) 4.0(H) 4.7(Q)  Total Protein 6.5 - 8.1 g/dL 6.8 6.6 6.8  Total Bilirubin 0.3 - 1.2 mg/dL 1.2 2.5(Z) 5.6(L)  Alkaline Phos 38 - 126 U/L 266(H) 309(H) 373(H)  AST 15 - 41 U/L 33 43(H) 47(H)  ALT 0 - 44 U/L 30 39 77(H)    Micro Results: Recent Results (from the past 240  hour(s))  Blood Culture (routine x 2)     Status: Abnormal   Collection Time: 08/19/18  3:02 PM  Result Value Ref Range Status   Specimen Description   Final    BLOOD BLOOD RIGHT FOREARM Performed at [facility], [address], [place], [place] [address]    Special Requests   Final    BOTTLES DRAWN AEROBIC AND ANAEROBIC Blood Culture results may not be optimal due to an excessive volume of blood received in culture bottles Performed at [facility], [address] [place], [place] [address]    Culture  Setup Time   Final    GRAM NEGATIVE RODS IN BOTH AEROBIC AND ANAEROBIC BOTTLES CRITICAL RESULT CALLED TO, READ BACK BY AND VERIFIED WITH: DAVID BESANTI AT 1884 08/20/18.PMH Performed at [facility], [address], [place], [place] [address]    Culture (A)  Final    KLEBSIELLA PNEUMONIAE MULTI-DRUG RESISTANT ORGANISM    Report Status 08/24/2018 FINAL  Final   Organism ID, Bacteria KLEBSIELLA PNEUMONIAE  Final      Susceptibility   Klebsiella pneumoniae - MIC*    AMPICILLIN >=32 RESISTANT Resistant     CEFAZOLIN >=64 RESISTANT Resistant  CEFEPIME RESISTANT Resistant     CEFTAZIDIME RESISTANT Resistant     CEFTRIAXONE RESISTANT Resistant     CIPROFLOXACIN >=4 RESISTANT Resistant     GENTAMICIN <=1 SENSITIVE Sensitive     IMIPENEM 0.5 SENSITIVE Sensitive     TRIMETH/SULFA >=320 RESISTANT Resistant     AMPICILLIN/SULBACTAM >=32 RESISTANT Resistant     PIP/TAZO RESISTANT Resistant     Extended ESBL NEGATIVE Sensitive     * KLEBSIELLA PNEUMONIAE  Blood Culture (routine x 2)     Status: Abnormal   Collection Time: 08/19/18  3:02 PM  Result Value Ref Range Status   Specimen Description   Final    BLOOD BLOOD LEFT FOREARM Performed at [facility], [address] [place], [place] [address]    Special Requests   Final    BOTTLES DRAWN AEROBIC AND ANAEROBIC Blood Culture results may not be optimal due to an excessive volume of blood  received in culture bottles Performed at [facility], [address] [place], [place] [address]    Culture  Setup Time   Final    GRAM NEGATIVE RODS AEROBIC BOTTLE ONLY CRITICAL RESULT CALLED TO, READ BACK BY AND VERIFIED WITH: DAVID BESANTI AT 2440 08/20/18.PMH Performed at [facility], [address] [place], [place] [address]    Culture (A)  Final    KLEBSIELLA PNEUMONIAE SUSCEPTIBILITIES PERFORMED ON PREVIOUS CULTURE WITHIN THE LAST 5 DAYS. Performed at [facility], [address] [place], [place] [address]    Report Status 08/24/2018 FINAL  Final  Blood Culture ID Panel (Reflexed)     Status: Abnormal   Collection Time: 08/19/18  3:02 PM  Result Value Ref Range Status   Enterococcus species NOT DETECTED NOT DETECTED Final   Listeria monocytogenes NOT DETECTED NOT DETECTED Final   Staphylococcus species NOT DETECTED NOT DETECTED Final   Staphylococcus aureus (BCID) NOT DETECTED NOT DETECTED Final   Streptococcus species NOT DETECTED NOT DETECTED Final   Streptococcus agalactiae NOT DETECTED NOT DETECTED Final   Streptococcus pneumoniae NOT DETECTED NOT DETECTED Final   Streptococcus pyogenes NOT DETECTED NOT DETECTED Final   Acinetobacter baumannii NOT DETECTED NOT DETECTED Final   Enterobacteriaceae species DETECTED (A) NOT DETECTED Final    Comment: Enterobacteriaceae represent a large family of gram-negative bacteria, not a single organism. CRITICAL RESULT CALLED TO, READ BACK BY AND VERIFIED WITH: DAVID BESANTI AT 4034 08/20/18.PMH    Enterobacter cloacae complex NOT DETECTED NOT DETECTED Final   Escherichia coli NOT DETECTED NOT DETECTED Final   Klebsiella oxytoca NOT DETECTED NOT DETECTED Final   Klebsiella pneumoniae DETECTED (A) NOT DETECTED Final    Comment: CRITICAL RESULT CALLED TO, READ BACK BY AND VERIFIED WITH: DAVID BESANTI AT 7425 08/20/18.PMH    Proteus species NOT DETECTED NOT DETECTED Final   Serratia marcescens NOT  DETECTED NOT DETECTED Final   Carbapenem resistance NOT DETECTED NOT DETECTED Final   Haemophilus influenzae NOT DETECTED NOT DETECTED Final   Neisseria meningitidis NOT DETECTED NOT DETECTED Final   Pseudomonas aeruginosa NOT DETECTED NOT DETECTED Final   Candida albicans NOT DETECTED NOT DETECTED Final   Candida glabrata NOT DETECTED NOT DETECTED Final   Candida krusei NOT DETECTED NOT DETECTED Final   Candida parapsilosis NOT DETECTED NOT DETECTED Final   Candida tropicalis NOT DETECTED NOT DETECTED Final    Comment: Performed at [facility], [address], [place], [place] [address]  Carbapenem Resistance Panel     Status: None  Collection Time: 08/19/18  3:02 PM  Result Value Ref Range Status   Carba Resistance IMP Gene NOT DETECTED NOT DETECTED Final   Carba Resistance VIM Gene NOT DETECTED NOT DETECTED Final   Carba Resistance NDM Gene NOT DETECTED NOT DETECTED Final   Carba Resistance KPC Gene NOT DETECTED NOT DETECTED Final   Carba Resistance OXA48 Gene NOT DETECTED NOT DETECTED Final    Comment: (NOTE) Cepheid Carba-R is an FDA-cleared nucleic acid amplification test  (NAAT)for the detection and differentiation of genes encoding the  most prevalent carbapenemases in bacterial isolate samples. Carbapenemase gene identification and implementation of comprehensive  infection control measures are recommended by the CDC to prevent the  spread of the resistant organisms. Performed at [facility], [address], [place], [place] [address]    Studies/Results: Dg C-arm 1-60 Min-no Report  Result Date: 08/23/2018 Fluoroscopy was utilized by the requesting physician.  No radiographic interpretation.   [place] Ekg Site Rite  Result Date: 08/24/2018 If Site Rite image not attached, placement could not be confirmed due to current cardiac rhythm.  Medications: I have reviewed the patient's current medications. Scheduled Meds: . amiodarone  200 mg Oral Daily  .  carbidopa-levodopa  1 tablet Oral TID  . carvedilol  3.125 mg Oral BID WC  . enoxaparin (LOVENOX) injection  1 mg/kg Subcutaneous BID  . ferrous sulfate  325 mg Oral Daily  . furosemide  20 mg Oral Daily  . Melatonin  5 mg Oral QHS  . multivitamin with minerals  1 tablet Oral Daily  . pantoprazole  40 mg Oral BID  . PARoxetine  30 mg Oral Daily  . potassium chloride SA  20 mEq Oral Daily  . senna  1 tablet Oral Daily  . traZODone  25 mg Oral QHS  . warfarin  5 mg Oral ONCE-1800  . Warfarin - Pharmacist Dosing Inpatient   Does not apply q1800   Continuous Infusions: . sodium chloride Stopped (08/22/18 0950)  . ertapenem     PRN Meds:.sodium chloride, acetaminophen **OR** acetaminophen, butalbital-acetaminophen-caffeine, ipratropium-albuterol, ondansetron **OR** ondansetron (ZOFRAN) IV   Assessment: Active Problems:   Sepsis (HCC)   Acute cholangitis   Calculus of bile duct with acute cholangitis with obstruction  Status post ERCP with biliary sphincterotomy and stone extraction, bilirubin normal, afebrile No evidence of post ERCP pancreatitis  Plan: Diet as tolerated Antibiotics for Klebsiella bacteremia per the primary team  GI will sign off, please call us back with questions   LOS: 5 days   Mikayla [name] 08/24/2018, 4:01 PM

## 2018-08-25 LAB — CULTURE, BLOOD (ROUTINE X 2)

## 2018-08-25 LAB — CBC
HEMATOCRIT: 38.3 % (ref 36.0–46.0)
HEMOGLOBIN: 11.5 g/dL — AB (ref 12.0–15.0)
MCH: 27.6 pg (ref 26.0–34.0)
MCHC: 30 g/dL (ref 30.0–36.0)
MCV: 92.1 fL (ref 80.0–100.0)
Platelets: 454 10*3/uL — ABNORMAL HIGH (ref 150–400)
RBC: 4.16 MIL/uL (ref 3.87–5.11)
RDW: 16.7 % — ABNORMAL HIGH (ref 11.5–15.5)
WBC: 11.5 10*3/uL — AB (ref 4.0–10.5)
nRBC: 0 % (ref 0.0–0.2)

## 2018-08-25 LAB — PROTIME-INR
INR: 1.16
Prothrombin Time: 14.7 seconds (ref 11.4–15.2)

## 2018-08-25 MED ORDER — SODIUM CHLORIDE 0.9 % IV SOLN: 1.0000 g | INTRAVENOUS | 0 refills | Status: DC

## 2018-08-25 MED ORDER — IMIPENEM-CILASTATIN 500 MG IV SOLR: 500.0000 mg | Freq: Four times a day (QID) | INTRAVENOUS | 0 refills | Status: AC

## 2018-08-25 MED ORDER — CARBIDOPA-LEVODOPA 10-100 MG PO TABS: 1.0000 | ORAL_TABLET | Freq: Three times a day (TID) | ORAL | Status: DC

## 2018-08-25 MED ORDER — WARFARIN SODIUM 5 MG PO TABS
5.0000 mg | ORAL_TABLET | Freq: Once | ORAL | Status: DC
  Filled 2018-08-25: qty 1

## 2018-08-25 NOTE — Clinical Social Work Note (Signed)
Patient is medically ready for discharge today back to [facility]. CSW notified patient of discharge today. CSW also called patient's daughter Susa Loffler [phone] and notified of discharge back to [facility] today. Patient and daughter are in agreement with discharge today. CSW notified [name] at [facility] of discharge today. Patient will be transported by EMS. RN to call report and call for transport.   Ruthe Mannan MSW, [address] [phone]

## 2018-08-25 NOTE — Discharge Summary (Addendum)
[employer] [facility] [facility] [place] [facility]   PATIENT NAME: Mikayla [name]    MR#:  [id number]  DATE OF BIRTH:  08/21/1941  DATE OF ADMISSION:  08/19/2018 ADMITTING PHYSICIAN: Houston Siren, MD  DATE OF DISCHARGE: 08/25/2018  PRIMARY CARE PHYSICIAN: System, Pcp Not In   ADMISSION DIAGNOSIS:  Wheezing [R06.2] Altered mental status, unspecified altered mental status type [R41.82] Sepsis, due to unspecified organism, unspecified whether acute organ dysfunction present (HCC) [A41.9]  DISCHARGE DIAGNOSIS:  Active Problems:   Sepsis (HCC)   Acute cholangitis   Calculus of bile duct with acute cholangitis with obstruction   SECONDARY DIAGNOSIS:   Past Medical History:  Diagnosis Date  . Atrial fibrillation (HCC)   . Empyema [facility])    in [facility] Aug 2017- stayed in hospital on for 2 months.  . Hypertension      ADMITTING HISTORY   HISTORY OF PRESENT ILLNESS:  Mikayla [name]  is a 77 y.o. female with a known history of paroxysmal atrial fibrillation, COPD, CHF, hypertension, chronic anemia who presents to the hospital due to shortness of breath, fever.  Patient was recently admitted to the hospital and treated for pneumonia and discharged on antibiotics and has finished a course and returns back from a skilled nursing facility as she was noted to have a fever of 102 and was complaining of shortness of breath.  Patient in the ER was not hypoxic and her chest x-ray findings are not suggestive of any pneumonia, although she did have a fever, leukocytosis and had some tachycardia and is suspected to have sepsis but the source presently remains unclear.  On her previous hospitalization patient also had abnormal LFTs with elevated bilirubin but there was no intervention done given the fact that she was anticoagulant since she was a high surgical risk but there was no clinical evidence of cholecystitis or cholangitis or choledocholithiasis.  A possible percutaneous drain was  recommended but since her LFTs improved and her bilirubin trended down she was treated supportively and discharged to a skilled nursing facility.  Her bilirubins today are only mildly elevated and her LFTs are also stable but slightly higher from her discharge but she denies any abdominal pain nausea vomiting or any other associated symptoms.  Hospitalist services were contacted for admission given her suspected sepsis.   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  GI was consulted for ERCP.  Had sphincterotomy and stent placement with stone removal.  Patient improved well after this.  Her liver function tests trended down.  Initially was on cefepime and once culture results were available was changed to Invanz. But this strain is also resistant to invanz. By the day of discharge patient has no abdominal pain.  Afebrile.  Will need Primaxin for 10 more days after discharge.  PICC line placed.  Patient's Coumadin was reversed for ERCP. This has been restarted. Continue amiodarone for atrial fibrillation.  Patient is stable for discharge back to her facility.  CONSULTS OBTAINED:   Gastroenterology.  Dr. Servando Snare  DRUG ALLERGIES:   Allergies  Allergen Reactions  . Levaquin [Levofloxacin]   . Other Nausea And Vomiting  . Sulfa Antibiotics Hives  . Oxycodone Anxiety and Other (See Comments)    DISCHARGE MEDICATIONS:   Allergies as of 08/25/2018  Reactions   Levaquin [levofloxacin]    Other Nausea And Vomiting   Sulfa Antibiotics Hives   Oxycodone Anxiety, Other (See Comments)      Medication List    TAKE these medications   amiodarone 200 MG tablet Commonly known as:  PACERONE Take 1 tablet (200 mg  total) by mouth daily.   carbidopa-levodopa 10-100 MG tablet Commonly known as:  SINEMET IR Take 1 tablet by mouth 3 (three) times daily.   carvedilol 3.125 MG tablet Commonly known as:  COREG Take 1 tablet (3.125 mg total) by mouth 2 (two) times daily with a meal.   ferrous sulfate 324 (65 Fe) MG Tbec Take 1 tablet by mouth daily.   furosemide 20 MG tablet Commonly known as:  LASIX Take 1 tablet (20 mg total) by mouth daily.   imipenem-cilastatin 500 MG injection Commonly known as:  PRIMAXIN Inject 500 mg into the vein every 6 (six) hours for 10 days.   ipratropium-albuterol 0.5-2.5 (3) MG/3ML Soln Commonly known as:  DUONEB Take 3 mLs by nebulization 3 (three) times daily.   Melatonin 5 MG Tabs Take 5 mg by mouth at bedtime.   multivitamin with minerals Tabs tablet Take 1 tablet by mouth daily.   omeprazole 20 MG tablet Commonly known as:  PRILOSEC OTC Take 20 mg by mouth 2 (two) times daily.   PARoxetine 30 MG tablet Commonly known as:  PAXIL Take 30 mg by mouth daily.   potassium chloride SA 20 MEQ tablet Commonly known as:  K-DUR,KLOR-CON Take 20 mEq by mouth daily.   senna 8.6 MG Tabs tablet Commonly known as:  SENOKOT Take 1 tablet by mouth daily.   traZODone 50 MG tablet Commonly known as:  DESYREL Take 25 mg by mouth at bedtime.   warfarin 5 MG tablet Commonly known as:  COUMADIN Take 5 mg by mouth daily at 8 pm. What changed:  Another medication with the same name was removed. Continue taking this medication, and follow the directions you see here.       Today   VITAL SIGNS:  Blood pressure (!) 142/72, pulse 67, temperature 98.2 F (36.8 C), temperature source Oral, resp. rate 18, height 5\' 4"  (1.626 m), weight 88.7 kg, SpO2 100 %.  I/O:    Intake/Output Summary (Last 24 hours) at 08/25/2018 0942 Last data filed at 08/25/2018 0400 Gross per 24 hour  Intake 921.98 ml  Output -  Net 921.98 ml    PHYSICAL EXAMINATION:  Physical  Exam  GENERAL:  77 y.o.-year-old patient lying in the bed with no acute distress.  Morbidly obese  lUNGS: Normal breath sounds bilaterally, no wheezing, rales,rhonchi or crepitation. No use of accessory muscles of respiration.  CARDIOVASCULAR: S1, S2 normal. No murmurs, rubs, or gallops.  ABDOMEN: Soft, non-tender, non-distended. Bowel sounds present. No organomegaly or mass.  NEUROLOGIC chronic left-sided weakness PSYCHIATRIC: The patient is alert and awake  DATA REVIEW:   CBC Recent Labs  Lab 08/25/18 0516  WBC 11.5*  HGB 11.5*  HCT 38.3  PLT 454*    Chemistries  Recent Labs  Lab 08/24/18 0507  NA 137  K 3.9  CL 97*  CO2 33*  GLUCOSE 140*  BUN 15  CREATININE 0.76  CALCIUM 8.6*  AST 33  ALT 30  ALKPHOS 266*  BILITOT 1.2    Cardiac Enzymes Recent Labs  Lab 08/19/18 1502  TROPONINI <0.03    Microbiology Results  Results for orders placed or performed during the hospital encounter of  08/19/18  Blood Culture (routine x 2)     Status: Abnormal   Collection Time: 08/19/18  3:02 PM  Result Value Ref Range Status   Specimen Description   Final    BLOOD BLOOD RIGHT FOREARM Performed at [facility], [address] [place], [place] [address]    Special Requests   Final    BOTTLES DRAWN AEROBIC AND ANAEROBIC Blood Culture results may not be optimal due to an excessive volume of blood received in culture bottles Performed at [facility], [address], [place], [place] [address]    Culture  Setup Time   Final    GRAM NEGATIVE RODS IN BOTH AEROBIC AND ANAEROBIC BOTTLES CRITICAL RESULT CALLED TO, READ BACK BY AND VERIFIED WITH: DAVID BESANTI AT 0981 08/20/18.PMH Performed at [facility], [address], [place], [place] [address]    Culture (A)  Final    KLEBSIELLA PNEUMONIAE MULTI-DRUG RESISTANT ORGANISM    Report Status 08/24/2018 FINAL  Final   Organism ID, Bacteria KLEBSIELLA PNEUMONIAE  Final      Susceptibility    Klebsiella pneumoniae - MIC*    AMPICILLIN >=32 RESISTANT Resistant     CEFAZOLIN >=64 RESISTANT Resistant     CEFEPIME RESISTANT Resistant     CEFTAZIDIME RESISTANT Resistant     CEFTRIAXONE RESISTANT Resistant     CIPROFLOXACIN >=4 RESISTANT Resistant     GENTAMICIN <=1 SENSITIVE Sensitive     IMIPENEM 0.5 SENSITIVE Sensitive     TRIMETH/SULFA >=320 RESISTANT Resistant     AMPICILLIN/SULBACTAM >=32 RESISTANT Resistant     PIP/TAZO RESISTANT Resistant     Extended ESBL NEGATIVE Sensitive     * KLEBSIELLA PNEUMONIAE  Blood Culture (routine x 2)     Status: Abnormal   Collection Time: 08/19/18  3:02 PM  Result Value Ref Range Status   Specimen Description   Final    BLOOD BLOOD LEFT FOREARM Performed at [facility], [address], [place], [place] [address]    Special Requests   Final    BOTTLES DRAWN AEROBIC AND ANAEROBIC Blood Culture results may not be optimal due to an excessive volume of blood received in culture bottles Performed at [facility], [address] [place], [place] [address]    Culture  Setup Time   Final    GRAM NEGATIVE RODS AEROBIC BOTTLE ONLY CRITICAL RESULT CALLED TO, READ BACK BY AND VERIFIED WITH: DAVID BESANTI AT 6578 08/20/18.PMH Performed at [facility], [address] [place], [place] [address]    Culture (A)  Final    KLEBSIELLA PNEUMONIAE SUSCEPTIBILITIES PERFORMED ON PREVIOUS CULTURE WITHIN THE LAST 5 DAYS. Performed at [facility], [address], [place], [place] [address]    Report Status 08/24/2018 FINAL  Final  Blood Culture ID Panel (Reflexed)     Status: Abnormal   Collection Time: 08/19/18  3:02 PM  Result Value Ref Range Status   Enterococcus species NOT DETECTED NOT DETECTED Final   Listeria monocytogenes NOT DETECTED NOT DETECTED Final   Staphylococcus species NOT DETECTED NOT DETECTED Final   Staphylococcus aureus (BCID) NOT DETECTED NOT DETECTED Final   Streptococcus species NOT DETECTED  NOT DETECTED Final   Streptococcus agalactiae NOT DETECTED NOT DETECTED Final   Streptococcus pneumoniae NOT DETECTED NOT DETECTED Final   Streptococcus pyogenes NOT DETECTED NOT DETECTED Final   Acinetobacter baumannii NOT DETECTED NOT DETECTED Final   Enterobacteriaceae species DETECTED (A) NOT DETECTED Final  Comment: Enterobacteriaceae represent a large family of gram-negative bacteria, not a single organism. CRITICAL RESULT CALLED TO, READ BACK BY AND VERIFIED WITH: DAVID BESANTI AT 9811 08/20/18.PMH    Enterobacter cloacae complex NOT DETECTED NOT DETECTED Final   Escherichia coli NOT DETECTED NOT DETECTED Final   Klebsiella oxytoca NOT DETECTED NOT DETECTED Final   Klebsiella pneumoniae DETECTED (A) NOT DETECTED Final    Comment: CRITICAL RESULT CALLED TO, READ BACK BY AND VERIFIED WITH: DAVID BESANTI AT 9147 08/20/18.PMH    Proteus species NOT DETECTED NOT DETECTED Final   Serratia marcescens NOT DETECTED NOT DETECTED Final   Carbapenem resistance NOT DETECTED NOT DETECTED Final   Haemophilus influenzae NOT DETECTED NOT DETECTED Final   Neisseria meningitidis NOT DETECTED NOT DETECTED Final   Pseudomonas aeruginosa NOT DETECTED NOT DETECTED Final   Candida albicans NOT DETECTED NOT DETECTED Final   Candida glabrata NOT DETECTED NOT DETECTED Final   Candida krusei NOT DETECTED NOT DETECTED Final   Candida parapsilosis NOT DETECTED NOT DETECTED Final   Candida tropicalis NOT DETECTED NOT DETECTED Final    Comment: Performed at [facility], [address] [place], [place] [address]  Carbapenem Resistance Panel     Status: None   Collection Time: 08/19/18  3:02 PM  Result Value Ref Range Status   Carba Resistance IMP Gene NOT DETECTED NOT DETECTED Final   Carba Resistance VIM Gene NOT DETECTED NOT DETECTED Final   Carba Resistance NDM Gene NOT DETECTED NOT DETECTED Final   Carba Resistance KPC Gene NOT DETECTED NOT DETECTED Final   Carba Resistance OXA48 Gene NOT  DETECTED NOT DETECTED Final    Comment: (NOTE) Cepheid Carba-R is an FDA-cleared nucleic acid amplification test  (NAAT)for the detection and differentiation of genes encoding the  most prevalent carbapenemases in bacterial isolate samples. Carbapenemase gene identification and implementation of comprehensive  infection control measures are recommended by the CDC to prevent the  spread of the resistant organisms. Performed at [facility], [address], [place], [place] [address]     RADIOLOGY:  Dg Chest Port 1 View  Result Date: 08/24/2018 CLINICAL DATA:  PICC line placement. EXAM: PORTABLE CHEST 1 VIEW COMPARISON:  Chest x-ray dated August 19, 2018. FINDINGS: The patient remains rotated to the right. Interval placement of a right upper extremity PICC line with the tip in the mid SVC. Stable cardiomediastinal silhouette. Normal pulmonary vascularity. No focal consolidation, pleural effusion, or pneumothorax. Unchanged elevation of the right hemidiaphragm. Right-sided rib fractures are unchanged. IMPRESSION: 1. New right upper extremity PICC line with tip in the mid SVC. Electronically Signed   By: Obie Dredge M.D.   On: 08/24/2018 16:07   Dg C-arm 1-60 Min-no Report  Result Date: 08/23/2018 Fluoroscopy was utilized by the requesting physician.  No radiographic interpretation.   [place] Ekg Site Rite  Result Date: 08/24/2018 If Site Rite image not attached, placement could not be confirmed due to current cardiac rhythm.   Follow up with PCP in 1 week.  Management plans discussed with the patient, family and they are in agreement.  CODE STATUS:     Code Status Orders  (From admission, onward)         Start     Ordered   08/19/18 1806  Do not attempt resuscitation (DNR)  Continuous    Question Answer Comment  In the event of cardiac or respiratory ARREST Do not call a "code blue"   In the event of cardiac or  respiratory ARREST Do not perform Intubation, CPR,  defibrillation or ACLS   In the event of cardiac or respiratory ARREST Use medication by any route, position, wound care, and other measures to relive pain and suffering. May use oxygen, suction and manual treatment of airway obstruction as needed for comfort.      08/19/18 1806        Code Status History    Date Active Date Inactive Code Status Order ID Comments User Context   08/10/2018 0111 08/12/2018 2135 DNR [id number]  Cammy Copa, MD Inpatient   11/20/2016 0904 11/21/2016 1854 DNR [id number]  Arnaldo Natal, MD Inpatient   10/24/2016 [phone] 10/30/2016 2126 DNR [id number]  Shaune Pollack, MD Inpatient   09/28/2016 1555 10/02/2016 1523 DNR [id number]  Altamese Dilling, MD Inpatient    Advance Directive Documentation     Most Recent Value  Type of Advance Directive  Healthcare Power of Attorney  Pre-existing out of facility DNR order (yellow form or pink MOST form)  -  "MOST" Form in Place?  -      TOTAL TIME TAKING CARE OF THIS PATIENT ON DAY OF DISCHARGE: more than 30 minutes.   Molinda Bailiff Rivkah Wolz M.D on 08/25/2018 at 9:42 AM  Between 7am to 6pm - Pager - [phone]  After 6pm go to [email or web address] - password [email or web address]  [facility] [place] [facility]  Office  [phone]  CC: Primary care physician; System, Pcp Not In  Note: This dictation was prepared with Dragon dictation along with smaller phrase technology. Any transcriptional errors that result from this process are unintentional.

## 2018-08-25 NOTE — Progress Notes (Signed)
Report called to [facility] and EMS called for transport. Bo Mcclintock, RN

## 2018-08-25 NOTE — Progress Notes (Signed)
ANTICOAGULATION CONSULT NOTE - Follow Up Consult  Pharmacy Consult for warfarin and enoxaparin dosing.  Indication: atrial fibrillation  Allergies  Allergen Reactions  . Levaquin [Levofloxacin]   . Other Nausea And Vomiting  . Sulfa Antibiotics Hives  . Oxycodone Anxiety and Other (See Comments)    Patient Measurements: Height: 5\' 4"  (162.6 cm) Weight: 195 lb 8.8 oz (88.7 kg) IBW/kg (Calculated) : 54.7   Vital Signs: Temp: 97.6 F (36.4 C) (10/31 0432) Temp Source: Oral (10/31 0432) BP: 110/89 (10/31 0432) Pulse Rate: 65 (10/31 0432)  Labs: Recent Labs    08/23/18 0519 08/24/18 0507 08/25/18 0516  HGB  --  11.7* 11.5*  HCT  --  38.8 38.3  PLT  --  418* 454*  LABPROT 14.8 14.0 14.7  INR 1.17 1.09 1.16  CREATININE  --  0.76  --     Estimated Creatinine Clearance: 63.5 mL/min (by C-G formula based on SCr of 0.76 mg/dL).   Medications:  Scheduled:  . amiodarone  200 mg Oral Daily  . carbidopa-levodopa  1 tablet Oral TID  . carvedilol  3.125 mg Oral BID WC  . enoxaparin (LOVENOX) injection  1 mg/kg Subcutaneous BID  . ferrous sulfate  325 mg Oral Daily  . furosemide  20 mg Oral Daily  . Melatonin  5 mg Oral QHS  . multivitamin with minerals  1 tablet Oral Daily  . pantoprazole  40 mg Oral BID  . PARoxetine  30 mg Oral Daily  . potassium chloride SA  20 mEq Oral Daily  . senna  1 tablet Oral Daily  . sodium chloride flush  10-40 mL Intracatheter Q12H  . traZODone  25 mg Oral QHS  . Warfarin - Pharmacist Dosing Inpatient   Does not apply q1800   Infusions:  . sodium chloride Stopped (08/22/18 0950)  . ertapenem 1,000 mg (08/24/18 1740)   PRN: sodium chloride, acetaminophen **OR** acetaminophen, butalbital-acetaminophen-caffeine, ipratropium-albuterol, ondansetron **OR** ondansetron (ZOFRAN) IV, sodium chloride flush  Assessment: INR below goal range at 1.16 but trending up. Home dose is 5 mg daily per med rec. Patient is also on amiodarone, may need to  adjust home regimen once patient reaches steady state. Order Daily INR. Enoxaparin was added yesterday to bridge patient. Will d/c once INR > 2.   DDI: amio (inc INR), paroxetine (inc bleeding), trazodone (inc bleeding) Date INR Warfarin Dose  10/29 1.17 2.5 mg   10/30 1.09 5 mg  10/31 1.16 5 mg ordered    Goal of Therapy:  INR 2-3 Monitor platelets by anticoagulation protocol: Yes   Plan:  Will give a 5 mg dose tonight. Continue to monitor for signs of bleeding and INR.   Paschal Dopp, PharmD  08/25/2018,7:14 AM

## 2018-08-25 NOTE — Progress Notes (Signed)
Patient discharged via EMS. Kiandra Sanguinetti S, RN  

## 2018-11-02 ENCOUNTER — Emergency Department: Payer: Medicare Other

## 2018-11-02 ENCOUNTER — Inpatient Hospital Stay
Admission: EM | Admit: 2018-11-02 | Discharge: 2018-11-08 | DRG: 853 | Disposition: A | Discharge status: Skilled Nursing Facility | Payer: Medicare Other | Source: Skilled Nursing Facility | Attending: Internal Medicine | Admitting: Internal Medicine

## 2018-11-02 ENCOUNTER — Other Ambulatory Visit: Payer: Self-pay

## 2018-11-02 DIAGNOSIS — J9610 Chronic respiratory failure, unspecified whether with hypoxia or hypercapnia: Secondary | ICD-10-CM | POA: Diagnosis present

## 2018-11-02 DIAGNOSIS — I959 Hypotension, unspecified: Secondary | ICD-10-CM | POA: Diagnosis not present

## 2018-11-02 DIAGNOSIS — Z7901 Long term (current) use of anticoagulants: Secondary | ICD-10-CM

## 2018-11-02 DIAGNOSIS — Z825 Family history of asthma and other chronic lower respiratory diseases: Secondary | ICD-10-CM

## 2018-11-02 DIAGNOSIS — D6832 Hemorrhagic disorder due to extrinsic circulating anticoagulants: Secondary | ICD-10-CM | POA: Diagnosis present

## 2018-11-02 DIAGNOSIS — Z79890 Hormone replacement therapy (postmenopausal): Secondary | ICD-10-CM

## 2018-11-02 DIAGNOSIS — S37011A Minor contusion of right kidney, initial encounter: Secondary | ICD-10-CM | POA: Diagnosis present

## 2018-11-02 DIAGNOSIS — Z515 Encounter for palliative care: Secondary | ICD-10-CM | POA: Diagnosis present

## 2018-11-02 DIAGNOSIS — Z800 Family history of malignant neoplasm of digestive organs: Secondary | ICD-10-CM

## 2018-11-02 DIAGNOSIS — X58XXXA Exposure to other specified factors, initial encounter: Secondary | ICD-10-CM | POA: Diagnosis present

## 2018-11-02 DIAGNOSIS — K429 Umbilical hernia without obstruction or gangrene: Secondary | ICD-10-CM | POA: Diagnosis present

## 2018-11-02 DIAGNOSIS — Z8709 Personal history of other diseases of the respiratory system: Secondary | ICD-10-CM

## 2018-11-02 DIAGNOSIS — K760 Fatty (change of) liver, not elsewhere classified: Secondary | ICD-10-CM | POA: Diagnosis present

## 2018-11-02 DIAGNOSIS — D638 Anemia in other chronic diseases classified elsewhere: Secondary | ICD-10-CM | POA: Diagnosis present

## 2018-11-02 DIAGNOSIS — R6521 Severe sepsis with septic shock: Secondary | ICD-10-CM | POA: Diagnosis present

## 2018-11-02 DIAGNOSIS — R6520 Severe sepsis without septic shock: Secondary | ICD-10-CM | POA: Diagnosis not present

## 2018-11-02 DIAGNOSIS — G20 Parkinson's disease: Secondary | ICD-10-CM | POA: Diagnosis present

## 2018-11-02 DIAGNOSIS — Z801 Family history of malignant neoplasm of trachea, bronchus and lung: Secondary | ICD-10-CM

## 2018-11-02 DIAGNOSIS — N390 Urinary tract infection, site not specified: Secondary | ICD-10-CM | POA: Diagnosis present

## 2018-11-02 DIAGNOSIS — Z9981 Dependence on supplemental oxygen: Secondary | ICD-10-CM

## 2018-11-02 DIAGNOSIS — N2889 Other specified disorders of kidney and ureter: Secondary | ICD-10-CM | POA: Diagnosis present

## 2018-11-02 DIAGNOSIS — Z7189 Other specified counseling: Secondary | ICD-10-CM | POA: Diagnosis not present

## 2018-11-02 DIAGNOSIS — G9341 Metabolic encephalopathy: Secondary | ICD-10-CM | POA: Diagnosis present

## 2018-11-02 DIAGNOSIS — T45515A Adverse effect of anticoagulants, initial encounter: Secondary | ICD-10-CM | POA: Diagnosis present

## 2018-11-02 DIAGNOSIS — I480 Paroxysmal atrial fibrillation: Secondary | ICD-10-CM | POA: Diagnosis not present

## 2018-11-02 DIAGNOSIS — Z789 Other specified health status: Secondary | ICD-10-CM

## 2018-11-02 DIAGNOSIS — Z882 Allergy status to sulfonamides status: Secondary | ICD-10-CM

## 2018-11-02 DIAGNOSIS — Z6841 Body Mass Index (BMI) 40.0 and over, adult: Secondary | ICD-10-CM | POA: Diagnosis not present

## 2018-11-02 DIAGNOSIS — D62 Acute posthemorrhagic anemia: Secondary | ICD-10-CM | POA: Diagnosis not present

## 2018-11-02 DIAGNOSIS — Z66 Do not resuscitate: Secondary | ICD-10-CM | POA: Diagnosis not present

## 2018-11-02 DIAGNOSIS — I110 Hypertensive heart disease with heart failure: Secondary | ICD-10-CM | POA: Diagnosis present

## 2018-11-02 DIAGNOSIS — R739 Hyperglycemia, unspecified: Secondary | ICD-10-CM | POA: Diagnosis present

## 2018-11-02 DIAGNOSIS — E872 Acidosis: Secondary | ICD-10-CM | POA: Diagnosis present

## 2018-11-02 DIAGNOSIS — N179 Acute kidney failure, unspecified: Secondary | ICD-10-CM | POA: Diagnosis present

## 2018-11-02 DIAGNOSIS — Z87891 Personal history of nicotine dependence: Secondary | ICD-10-CM

## 2018-11-02 DIAGNOSIS — Z8249 Family history of ischemic heart disease and other diseases of the circulatory system: Secondary | ICD-10-CM

## 2018-11-02 DIAGNOSIS — S37091A Other injury of right kidney, initial encounter: Secondary | ICD-10-CM | POA: Diagnosis not present

## 2018-11-02 DIAGNOSIS — I509 Heart failure, unspecified: Secondary | ICD-10-CM | POA: Diagnosis present

## 2018-11-02 DIAGNOSIS — Z881 Allergy status to other antibiotic agents status: Secondary | ICD-10-CM

## 2018-11-02 DIAGNOSIS — A419 Sepsis, unspecified organism: Secondary | ICD-10-CM | POA: Diagnosis present

## 2018-11-02 DIAGNOSIS — R578 Other shock: Secondary | ICD-10-CM

## 2018-11-02 DIAGNOSIS — R112 Nausea with vomiting, unspecified: Secondary | ICD-10-CM | POA: Diagnosis present

## 2018-11-02 DIAGNOSIS — K819 Cholecystitis, unspecified: Secondary | ICD-10-CM

## 2018-11-02 DIAGNOSIS — I10 Essential (primary) hypertension: Secondary | ICD-10-CM | POA: Diagnosis present

## 2018-11-02 DIAGNOSIS — H9190 Unspecified hearing loss, unspecified ear: Secondary | ICD-10-CM | POA: Diagnosis present

## 2018-11-02 DIAGNOSIS — K8020 Calculus of gallbladder without cholecystitis without obstruction: Secondary | ICD-10-CM | POA: Diagnosis present

## 2018-11-02 DIAGNOSIS — Z7401 Bed confinement status: Secondary | ICD-10-CM

## 2018-11-02 DIAGNOSIS — Z885 Allergy status to narcotic agent status: Secondary | ICD-10-CM

## 2018-11-02 DIAGNOSIS — Z79899 Other long term (current) drug therapy: Secondary | ICD-10-CM

## 2018-11-02 LAB — CBC WITH DIFFERENTIAL/PLATELET
ABS IMMATURE GRANULOCYTES: 1.17 10*3/uL — AB (ref 0.00–0.07)
Basophils Absolute: 0.1 10*3/uL (ref 0.0–0.1)
Basophils Relative: 0 %
EOS PCT: 0 %
Eosinophils Absolute: 0 10*3/uL (ref 0.0–0.5)
HCT: 30.6 % — ABNORMAL LOW (ref 36.0–46.0)
Hemoglobin: 8.8 g/dL — ABNORMAL LOW (ref 12.0–15.0)
Immature Granulocytes: 3 %
Lymphocytes Relative: 8 %
Lymphs Abs: 2.9 10*3/uL (ref 0.7–4.0)
MCH: 27.2 pg (ref 26.0–34.0)
MCHC: 28.8 g/dL — ABNORMAL LOW (ref 30.0–36.0)
MCV: 94.7 fL (ref 80.0–100.0)
Monocytes Absolute: 1.2 10*3/uL — ABNORMAL HIGH (ref 0.1–1.0)
Monocytes Relative: 4 %
Neutro Abs: 30.5 10*3/uL — ABNORMAL HIGH (ref 1.7–7.7)
Neutrophils Relative %: 85 %
Platelets: 447 10*3/uL — ABNORMAL HIGH (ref 150–400)
RBC: 3.23 MIL/uL — ABNORMAL LOW (ref 3.87–5.11)
RDW: 15.1 % (ref 11.5–15.5)
Smear Review: NORMAL
WBC: 35.9 10*3/uL — ABNORMAL HIGH (ref 4.0–10.5)
nRBC: 0.1 % (ref 0.0–0.2)

## 2018-11-02 LAB — HEPATIC FUNCTION PANEL
ALT: 83 U/L — ABNORMAL HIGH (ref 0–44)
AST: 212 U/L — ABNORMAL HIGH (ref 15–41)
Albumin: 3.3 g/dL — ABNORMAL LOW (ref 3.5–5.0)
Alkaline Phosphatase: 83 U/L (ref 38–126)
BILIRUBIN TOTAL: 0.6 mg/dL (ref 0.3–1.2)
Bilirubin, Direct: 0.1 mg/dL (ref 0.0–0.2)
Indirect Bilirubin: 0.5 mg/dL (ref 0.3–0.9)
Total Protein: 7 g/dL (ref 6.5–8.1)

## 2018-11-02 LAB — BASIC METABOLIC PANEL
Anion gap: 19 — ABNORMAL HIGH (ref 5–15)
BUN: 30 mg/dL — ABNORMAL HIGH (ref 8–23)
CO2: 23 mmol/L (ref 22–32)
CREATININE: 3.03 mg/dL — AB (ref 0.44–1.00)
Calcium: 8.6 mg/dL — ABNORMAL LOW (ref 8.9–10.3)
Chloride: 93 mmol/L — ABNORMAL LOW (ref 98–111)
GFR calc Af Amer: 16 mL/min — ABNORMAL LOW (ref >60)
GFR calc non Af Amer: 14 mL/min — ABNORMAL LOW (ref >60)
Glucose, Bld: 241 mg/dL — ABNORMAL HIGH (ref 70–99)
Potassium: 4.8 mmol/L (ref 3.5–5.1)
Sodium: 135 mmol/L (ref 135–145)

## 2018-11-02 LAB — CG4 I-STAT (LACTIC ACID): Lactic Acid, Venous: 8.39 mmol/L (ref 0.5–1.9)

## 2018-11-02 LAB — PROTIME-INR
INR: 3.06
Prothrombin Time: 31.2 seconds — ABNORMAL HIGH (ref 11.4–15.2)

## 2018-11-02 LAB — GLUCOSE, CAPILLARY: Glucose-Capillary: 113 mg/dL — ABNORMAL HIGH (ref 70–99)

## 2018-11-02 LAB — LIPASE, BLOOD: Lipase: 18 U/L (ref 11–51)

## 2018-11-02 LAB — LACTIC ACID, PLASMA: Lactic Acid, Venous: 4.8 mmol/L (ref 0.5–1.9)

## 2018-11-02 MED ORDER — SODIUM CHLORIDE 0.9 % IV SOLN
2.0000 g | Freq: Once | INTRAVENOUS | Status: AC
  Administered 2018-11-02: 2 g via INTRAVENOUS
  Filled 2018-11-02: qty 2

## 2018-11-02 MED ORDER — PROTHROMBIN COMPLEX CONC HUMAN 500 UNITS IV KIT
1644.0000 [IU] | PACK | Freq: Once | Status: AC
  Administered 2018-11-02: 1644 [IU] via INTRAVENOUS
  Filled 2018-11-02: qty 1644

## 2018-11-02 MED ORDER — PANTOPRAZOLE SODIUM 40 MG PO TBEC: 40.0000 mg | DELAYED_RELEASE_TABLET | Freq: Two times a day (BID) | ORAL | Status: DC

## 2018-11-02 MED ORDER — METRONIDAZOLE IN NACL 5-0.79 MG/ML-% IV SOLN
500.0000 mg | Freq: Three times a day (TID) | INTRAVENOUS | Status: DC
  Administered 2018-11-02 – 2018-11-03 (×2): 500 mg via INTRAVENOUS
  Filled 2018-11-02 (×4): qty 100

## 2018-11-02 MED ORDER — VANCOMYCIN HCL 10 G IV SOLR
1500.0000 mg | Freq: Once | INTRAVENOUS | Status: AC
  Administered 2018-11-03: 1500 mg via INTRAVENOUS
  Filled 2018-11-02: qty 1500

## 2018-11-02 MED ORDER — SODIUM CHLORIDE 0.9 % IV SOLN
INTRAVENOUS | Status: DC
  Administered 2018-11-03 – 2018-11-05 (×2): via INTRAVENOUS

## 2018-11-02 MED ORDER — ONDANSETRON HCL 4 MG PO TABS: 4.0000 mg | ORAL_TABLET | Freq: Four times a day (QID) | ORAL | Status: DC | PRN

## 2018-11-02 MED ORDER — ACETAMINOPHEN 325 MG PO TABS
650.0000 mg | ORAL_TABLET | Freq: Four times a day (QID) | ORAL | Status: DC | PRN
  Administered 2018-11-05 – 2018-11-07 (×7): 650 mg via ORAL
  Filled 2018-11-02 (×7): qty 2

## 2018-11-02 MED ORDER — OMEPRAZOLE MAGNESIUM 20 MG PO TBEC: 20.0000 mg | DELAYED_RELEASE_TABLET | Freq: Two times a day (BID) | ORAL | Status: DC

## 2018-11-02 MED ORDER — ONDANSETRON HCL 4 MG/2ML IJ SOLN
4.0000 mg | Freq: Four times a day (QID) | INTRAMUSCULAR | Status: DC | PRN
  Administered 2018-11-06: 4 mg via INTRAVENOUS
  Filled 2018-11-02: qty 2

## 2018-11-02 MED ORDER — SODIUM CHLORIDE 0.9 % IV SOLN: 1.0000 g | Freq: Three times a day (TID) | INTRAVENOUS | Status: DC

## 2018-11-02 MED ORDER — CARBIDOPA-LEVODOPA 10-100 MG PO TABS
1.0000 | ORAL_TABLET | Freq: Three times a day (TID) | ORAL | Status: DC
  Administered 2018-11-03 – 2018-11-08 (×12): 1 via ORAL
  Filled 2018-11-02 (×20): qty 1

## 2018-11-02 MED ORDER — SODIUM CHLORIDE 0.9 % IV BOLUS
1000.0000 mL | Freq: Once | INTRAVENOUS | Status: AC
  Administered 2018-11-02: 1000 mL via INTRAVENOUS

## 2018-11-02 MED ORDER — ACETAMINOPHEN 650 MG RE SUPP
650.0000 mg | Freq: Four times a day (QID) | RECTAL | Status: DC | PRN
  Administered 2018-11-04: 650 mg via RECTAL
  Filled 2018-11-02: qty 1

## 2018-11-02 MED ORDER — IPRATROPIUM-ALBUTEROL 0.5-2.5 (3) MG/3ML IN SOLN
3.0000 mL | Freq: Once | RESPIRATORY_TRACT | Status: AC
  Administered 2018-11-02: 3 mL via RESPIRATORY_TRACT
  Filled 2018-11-02: qty 3

## 2018-11-02 MED ORDER — NOREPINEPHRINE BITARTRATE 1 MG/ML IV SOLN
0.0000 ug/min | INTRAVENOUS | Status: DC
  Administered 2018-11-03: 5 ug/min via INTRAVENOUS
  Filled 2018-11-02: qty 4

## 2018-11-02 MED ORDER — SODIUM CHLORIDE 0.9 % IV SOLN
500.0000 mg | Freq: Two times a day (BID) | INTRAVENOUS | Status: DC
  Administered 2018-11-03 – 2018-11-06 (×8): 500 mg via INTRAVENOUS
  Filled 2018-11-02: qty 500
  Filled 2018-11-02 (×3): qty 0.5
  Filled 2018-11-02 (×4): qty 500
  Filled 2018-11-02 (×2): qty 0.5
  Filled 2018-11-02: qty 500

## 2018-11-02 MED ORDER — ACETAMINOPHEN 500 MG PO TABS
1000.0000 mg | ORAL_TABLET | Freq: Once | ORAL | Status: AC
  Administered 2018-11-02: 1000 mg via ORAL
  Filled 2018-11-02: qty 2

## 2018-11-02 MED ORDER — PROTHROMBIN COMPLEX CONC HUMAN 500 UNITS IV KIT
1500.0000 [IU] | PACK | Freq: Once | Status: DC
  Filled 2018-11-02: qty 1500

## 2018-11-02 MED ORDER — VITAMIN K1 10 MG/ML IJ SOLN
10.0000 mg | Freq: Once | INTRAVENOUS | Status: AC
  Administered 2018-11-02: 10 mg via INTRAVENOUS
  Filled 2018-11-02: qty 1

## 2018-11-02 NOTE — Progress Notes (Signed)
11/02/18  78 yo F presenting to the emergency room with presumed sepsis, acute renal injury and multiorgan dysfunction found to have a large right perinephric/parenchymal hematoma measuring 9 x 11 x 16 cm extending into the retroperitoneum the setting of anticoagulation INR of 3.0 (no clear underlying renal mass).   Sepsis is still unknown at this point, further work-up is pending.  Hbg 8.8 from 11.5 3 months ago.  Currently, she is mildly tachycardic with soft pressures but not requiring pressors.    Case discussed with emergency room physician, Dr. Marisa Severin.    Recs are as follows:  1. Bed rest  2. Serial Hbg q6 -8 hours, transfuse as needed  3. If hemodynamics worsen or fail to improve, consult vascular surgery for consideration of angiography, selective embolization  4. Agree with INR reversal which has already been implemented  5. NPO at MN incase intervention is needed tomorrow   All labs and imaging personally reviewed.  Full consult to follow.  Vanna Scotland, MD

## 2018-11-02 NOTE — H&P (Addendum)
[facility] - [place] at [facility]   PATIENT NAME: Mikayla [name]    MR#:  [id number]  DATE OF BIRTH:  05-Nov-1940  DATE OF ADMISSION:  11/02/2018  PRIMARY CARE PHYSICIAN: System, Pcp Not In   REQUESTING/REFERRING PHYSICIAN: Siadecki, MD  CHIEF COMPLAINT:   Chief Complaint  Patient presents with  . Emesis  . Abdominal Pain    HISTORY OF PRESENT ILLNESS:  Mikayla [name]  is a 78 y.o. female who presents with chief complaint as above.  Patient was admitted from the facility where she resides with a complaint of 2 days of abdominal pain and some vomiting.  Family states that the vomiting is very atypical for her.  On evaluation here she was found to have severe sepsis with AKI, hypertension, and a significantly elevated lactic acid at 8.  She was aggressively resuscitated in the ED with IV fluids, and her pressure responded nicely.  Her lactic acid came down significantly, though is still elevated at greater than 4.  Suspected source is urine, though this is yet to be confirmed.  She was also found on CT imaging to have a right renal hemorrhage, with some bleeding into the pararenal space with large hematoma.  She is on warfarin for A. fib.  She was given reversal agents in the ED.  Urology was contacted by ED physician and feels that as long as the patient tends to be stabilizing reversal and conservative management is optimal for now, though if she becomes unstable consideration could be given to vascular intervention.  Urology following along.  Hospitalist called for admission  PAST MEDICAL HISTORY:   Past Medical History:  Diagnosis Date  . Atrial fibrillation (HCC)   . Empyema [facility])    in [facility] Aug 2017- stayed in hospital on for 2 months.  . Hypertension      PAST SURGICAL HISTORY:   Past Surgical History:  Procedure Laterality Date  . ERCP N/A 08/23/2018   Procedure: ENDOSCOPIC RETROGRADE CHOLANGIOPANCREATOGRAPHY (ERCP);  Surgeon: Midge Minium,  MD;  Location: [facility] ENDOSCOPY;  Service: Endoscopy;  Laterality: N/A;  . PEG TUBE PLACEMENT       SOCIAL HISTORY:   Social History   Tobacco Use  . Smoking status: Former Smoker    Types: Cigarettes  . Smokeless tobacco: Never Used  . Tobacco comment: quit in 2001. smoked for 30 years  Substance Use Topics  . Alcohol use: No     FAMILY HISTORY:   Family History  Problem Relation Age of Onset  . Hypertension Mother   . COPD Mother   . Lung cancer Father   . Liver cancer Sister      DRUG ALLERGIES:   Allergies  Allergen Reactions  . Levaquin [Levofloxacin]   . Other Nausea And Vomiting  . Sulfa Antibiotics Hives  . Oxycodone Anxiety and Other (See Comments)    MEDICATIONS AT HOME:   Prior to Admission medications   Medication Sig Start Date End Date Taking? Authorizing Provider  acetaminophen (TYLENOL) 325 MG tablet Take 650 mg by mouth every 4 (four) hours as needed for mild pain or fever.   Yes [provider]  amiodarone (PACERONE) 200 MG tablet Take 1 tablet (200 mg total) by mouth daily. 11/22/16  Yes Shaune Pollack, MD  carbidopa-levodopa (SINEMET IR) 10-100 MG tablet Take 1 tablet by mouth 3 (three) times daily. 08/25/18  Yes Sudini, Wardell Heath, MD  ferrous sulfate 324 (65 Fe) MG TBEC Take 1 tablet by mouth daily.  Yes [provider]  furosemide (LASIX) 20 MG tablet Take 1 tablet (20 mg total) by mouth daily. 10/30/16  Yes Sudini, Wardell Heath, MD  gabapentin (NEURONTIN) 300 MG capsule Take 300 mg by mouth at bedtime.   Yes [provider]  guaiFENesin (ROBITUSSIN) 100 MG/5ML SOLN Take 10 mLs by mouth every 6 (six) hours as needed for cough or to loosen phlegm. FOR 10 DAYS 11/01/18 11/07/18 Yes [provider]  ipratropium-albuterol (DUONEB) 0.5-2.5 (3) MG/3ML SOLN Take 3 mLs by nebulization 3 (three) times daily.   Yes [provider]  Melatonin 5 MG TABS Take 5 mg by mouth at bedtime.   Yes [provider]  Multiple  Vitamins-Minerals (CENTROVITE) TABS Take 1 tablet by mouth daily.   Yes [provider]  nystatin (NYSTATIN) powder Apply topically 2 (two) times daily as needed. APPLY UNDER NECK TWICE DAILY AS NEEDED FOR YEAST   Yes [provider]  omeprazole (PRILOSEC OTC) 20 MG tablet Take 20 mg by mouth 2 (two) times daily.   Yes [provider]  ondansetron (ZOFRAN-ODT) 4 MG disintegrating tablet Take 4 mg by mouth every 6 (six) hours as needed for nausea or vomiting. FOR 3 DAYS 11/01/18 11/04/18 Yes [provider]  oxymetazoline (AFRIN) 0.05 % nasal spray Place 2 sprays into both nostrils 2 (two) times daily. For 3 days 11/02/18 11/04/18 Yes [provider]  PARoxetine (PAXIL) 30 MG tablet Take 30 mg by mouth daily.    Yes [provider]  potassium chloride SA (K-DUR,KLOR-CON) 20 MEQ tablet Take 20 mEq by mouth daily.   Yes [provider]  senna (SENOKOT) 8.6 MG TABS tablet Take 1 tablet by mouth daily.   Yes [provider]  traZODone (DESYREL) 50 MG tablet Take 25 mg by mouth at bedtime.   Yes [provider]  warfarin (COUMADIN) 5 MG tablet Take 5 mg by mouth daily at 8 pm.   Yes [provider]  carvedilol (COREG) 3.125 MG tablet Take 1 tablet (3.125 mg total) by mouth 2 (two) times daily with a meal. Patient not taking: Reported on 08/19/2018 08/12/18   Altamese Dilling, MD  Multiple Vitamin (MULTIVITAMIN WITH MINERALS) TABS tablet Take 1 tablet by mouth daily.    [provider]    REVIEW OF SYSTEMS:  Review of Systems  Unable to perform ROS: Acuity of condition     VITAL SIGNS:   Vitals:   11/02/18 1854 11/02/18 1917 11/02/18 2000 11/02/18 2145  BP:  (!) 81/58 (!) 98/49 (!) 74/28  Pulse:  (!) 108  (!) 108  Resp:  (!) 22 (!) 28 (!) 26  Temp:      TempSrc:      SpO2:  95%  91%  Height: 5\' 4"  (1.626 m)      Wt Readings from Last 3 Encounters:  08/19/18 88.7 kg  08/09/18 88.7 kg   11/21/16 88.7 kg    PHYSICAL EXAMINATION:  Physical Exam  Vitals reviewed. Constitutional: She appears well-developed and well-nourished. No distress.  HENT:  Head: Normocephalic and atraumatic.  Mouth/Throat: Oropharynx is clear and moist.  Eyes: Pupils are equal, round, and reactive to light. Conjunctivae and EOM are normal. No scleral icterus.  Neck: Normal range of motion. Neck supple. No JVD present. No thyromegaly present.  Cardiovascular: Normal rate, regular rhythm and intact distal pulses. Exam reveals no gallop and no friction rub.  No murmur heard. Respiratory: Effort normal and breath sounds normal. No respiratory distress. She  has no wheezes. She has no rales.  GI: Soft. Bowel sounds are normal. She exhibits no distension. There is abdominal tenderness.  Musculoskeletal: Normal range of motion.        General: No edema.     Comments: No arthritis, no gout  Lymphadenopathy:    She has no cervical adenopathy.  Neurological: No cranial nerve deficit.  No dysarthria, no aphasia  Skin: Skin is warm and dry. No rash noted. No erythema.  Psychiatric:  Unable to fully assess due to patient condition    LABORATORY PANEL:   CBC Recent Labs  Lab 11/02/18 1856  WBC 35.9*  HGB 8.8*  HCT 30.6*  PLT 447*   ------------------------------------------------------------------------------------------------------------------  Chemistries  Recent Labs  Lab 11/02/18 1905  NA 135  K 4.8  CL 93*  CO2 23  GLUCOSE 241*  BUN 30*  CREATININE 3.03*  CALCIUM 8.6*  AST 212*  ALT 83*  ALKPHOS 83  BILITOT 0.6   ------------------------------------------------------------------------------------------------------------------  Cardiac Enzymes No results for input(s): TROPONINI in the last 168 hours. ------------------------------------------------------------------------------------------------------------------  RADIOLOGY:  Ct Abdomen Pelvis Wo Contrast  Result Date:  11/02/2018 CLINICAL DATA:  Acute abdominal pain.  Fever.  On Coumadin. EXAM: CT ABDOMEN AND PELVIS WITHOUT CONTRAST TECHNIQUE: Multidetector CT imaging of the abdomen and pelvis was performed following the standard protocol without IV contrast. COMPARISON:  CT abdomen pelvis 08/21/2018 FINDINGS: Lower chest: Calcified granuloma left lower lobe. Multifocal nodular densities right lung base posteriorly have progressed. Probable mucous plugging. No effusion. Hepatobiliary: Small calcified gallstones. No biliary dilatation. No focal liver lesion. Pancreas: Pancreatic atrophy.  No mass or edema Spleen: Negative Adrenals/Urinary Tract: Hyperdense right renal hemorrhage has developed since the prior study. This involves the upper mid and lower pole extends into the perirenal space laterally. Hematoma is also present in the retroperitoneum posteriorly which appears to connect with the upper pole of the right renal hemorrhage. Posterior pararenal hematoma measures approximately 9 x 11 x 16 cm and also is hyperdense. No underlying renal lesion is seen on the recent CT. Patient is on Coumadin with INR of 3. Left kidney is atrophic without focal abnormality. Bladder is empty. Stomach/Bowel: Negative for bowel obstruction. No bowel mass or edema. Vascular/Lymphatic: Atherosclerotic aorta without aneurysm. No lymphadenopathy. Reproductive: Normal uterus.  No pelvic mass Other: Small amount of free fluid in the pelvis. 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 Musculoskeletal: Diffuse lumbar degenerative changes. No acute skeletal abnormality. IMPRESSION: Acute hemorrhage in the right kidney. Hemorrhage has ruptured into the posterior pararenal space where there is a large acute hematoma. No underlying renal mass is seen on recent CT of 08/21/2018. Hemorrhage is likely due to coagulopathy. These results were called by telephone at the time of interpretation on 11/02/2018 at 8:45 pm to Dr. Dionne Bucy , who verbally  acknowledged these results. Electronically Signed   By: Marlan Palau M.D.   On: 11/02/2018 20:45   Dg Chest Portable 1 View  Result Date: 11/02/2018 CLINICAL DATA:  Fever and hypotension EXAM: PORTABLE CHEST 1 VIEW COMPARISON:  11/02/2018 CT AP FINDINGS: Lung volumes are low. The patient is slightly rotated on this exam. Left atrial enlargement is identified. Aortic atherosclerosis without aneurysm. Mild vascular congestion without acute pulmonary consolidation. Nodule at the left lung base consistent with a granuloma based on same day CT. Atelectasis is noted at the right lung base. Remote posttraumatic deformity of the right fifth rib. IMPRESSION: 1. Low lung volumes with atelectasis at the right lung base. Mild vascular  congestion. 2. Dense nodule at the left lung base consistent with a granuloma as seen on same day CT. Electronically Signed   By: Tollie Eth M.D.   On: 11/02/2018 22:09   US Abdomen Limited Ruq  Result Date: 11/02/2018 CLINICAL DATA:  78 y/o  F; evaluation of cholecystitis. EXAM: ULTRASOUND ABDOMEN LIMITED RIGHT UPPER QUADRANT COMPARISON:  11/02/2018 CT abdomen and pelvis. FINDINGS: Gallbladder: Cholelithiasis. Collapsed gallbladder. Gallbladder wall measures 2.5 mm in thickness. No pericholecystic fluid. Common bile duct: Diameter: 2.5 mm Liver: Increased liver echogenicity. No focal liver lesion identified. Trace perihepatic fluid. Portal vein is patent on color Doppler imaging with normal direction of blood flow towards the liver. Other: Partially visualized collection within the right renal fossa. IMPRESSION: 1. Cholelithiasis.  No secondary signs of acute cholecystitis. 2. Echogenic liver compatible with steatosis. 3. Partially visualized right retroperitoneal collection corresponding to hematoma on CT. Electronically Signed   By: Mitzi Hansen M.D.   On: 11/02/2018 21:07    EKG:   Orders placed or performed during the hospital encounter of 11/02/18  . EKG 12-Lead   . EKG 12-Lead    IMPRESSION AND PLAN:  Principal Problem:   Severe sepsis (HCC) -suspected source is urinary.  IV antibiotics given.  Lactic acid significantly elevated, aggressive IV fluids in place, trend lactic acid until within normal limits, cultures ordered, patient is admitted to the ICU Active Problems:   Renal hemorrhage, right -vitamin K reversal protocol followed, serial hemoglobin checks tonight, urology consulted   UTI -suspected source of her sepsis, IV antibiotics in place as above, culture ordered   AKI -likely due to her hemorrhage and sepsis, aggressive IV fluids as above, avoid nephrotoxins and monitor creatinine   HTN (hypertension) -hold all antihypertensives for now as the patient's blood pressure has been low, IV fluids as above   PAF (paroxysmal atrial fibrillation) (HCC) -reversing anticoagulation, hold other medications for now  Chart review performed and case discussed with ED provider. Labs, imaging and/or ECG reviewed by provider and discussed with patient/family. Management plans discussed with the patient and/or family.  DVT PROPHYLAXIS: Mechanical only  GI PROPHYLAXIS:  PPI   ADMISSION STATUS: Inpatient     CODE STATUS: DNR Code Status History    Date Active Date Inactive Code Status Order ID Comments User Context   08/19/2018 1806 08/25/2018 1545 DNR [id number]  Houston Siren, MD ED   08/10/2018 0111 08/12/2018 2135 DNR [id number]  Cammy Copa, MD Inpatient   11/20/2016 0904 11/21/2016 1854 DNR [id number]  Arnaldo Natal, MD Inpatient   10/24/2016 0307 10/30/2016 2126 DNR [id number]  Shaune Pollack, MD Inpatient   09/28/2016 1555 10/02/2016 1523 DNR [id number]  Altamese Dilling, MD Inpatient    Questions for Most Recent Historical Code Status (Order [id number])    Question Answer Comment   In the event of cardiac or respiratory ARREST Do not call a "code blue"    In the event of cardiac or respiratory ARREST Do not perform Intubation, CPR,  defibrillation or ACLS    In the event of cardiac or respiratory ARREST Use medication by any route, position, wound care, and other measures to relive pain and suffering. May use oxygen, suction and manual treatment of airway obstruction as needed for comfort.         Advance Directive Documentation     Most Recent Value  Type of Advance Directive  Out of facility DNR (pink MOST or yellow form), Healthcare Power of Attorney  Pre-existing out of facility  DNR order (yellow form or pink MOST form)  Yellow form placed in chart (order not valid for inpatient use)  "MOST" Form in Place?  -      TOTAL CRITICAL CARE TIME TAKING CARE OF THIS PATIENT: 50 minutes.   Ezariah Nace FIELDING 11/02/2018, 10:42 PM  [facility]  Office  [phone]  CC: Primary care physician; System, Pcp Not In  Note:  This document was prepared using Dragon voice recognition software and may include unintentional dictation errors.

## 2018-11-02 NOTE — Progress Notes (Addendum)
MEDICATION RELATED CONSULT NOTE - INITIAL   Pharmacy Consult for K-Centra  Indication:   Peri-nephric hematoma ,  Warfarin reversal   Allergies  Allergen Reactions  . Levaquin [Levofloxacin]   . Other Nausea And Vomiting  . Sulfa Antibiotics Hives  . Oxycodone Anxiety and Other (See Comments)    Patient Measurements: Height: 5\' 4"  (162.6 cm) IBW/kg (Calculated) : 54.7 Adjusted Body Weight:   Vital Signs: Temp: 100.8 F (38.2 C) (01/08 1853) Temp Source: Rectal (01/08 1853) BP: 74/28 (01/08 2145) Pulse Rate: 108 (01/08 2145) Intake/Output from previous day: No intake/output data recorded. Intake/Output from this shift: No intake/output data recorded.  Labs: Recent Labs    11/02/18 1856 11/02/18 1905  WBC 35.9*  --   HGB 8.8*  --   HCT 30.6*  --   PLT 447*  --   CREATININE  --  3.03*  ALBUMIN  --  3.3*  PROT  --  7.0  AST  --  212*  ALT  --  83*  ALKPHOS  --  83  BILITOT  --  0.6  BILIDIR  --  0.1  IBILI  --  0.5   CrCl cannot be calculated (Unknown ideal weight.).   Microbiology: No results found for this or any previous visit (from the past 720 hour(s)).  Medical History: Past Medical History:  Diagnosis Date  . Atrial fibrillation (HCC)   . Empyema [facility])    in [facility] Aug 2017- stayed in hospital on for 2 months.  . Hypertension     Medications:  (Not in a hospital admission)   Assessment: Pharmacy consulted to dose and monitor Mikayla [name] in this 78 year old female with peri - nephric hematoma, on warfarin PTA. 1/8:  INR @ 1900 = 3.06  TBW = 145.3 lbs (66kg)  Goal of Therapy:  Reversal of anticoagulation   Plan:  Will order KCentra 1644 units IV X 1 .  Will check INR 60 minutes post infusion and then Q6H X 4 and then daily X 2.   Mikayla [name] D 11/02/2018,10:03 PM

## 2018-11-02 NOTE — ED Provider Notes (Signed)
[facility] Emergency Department Provider Note ____________________________________________   First MD Initiated Contact with Patient 11/02/18 1900     (approximate)  I have reviewed the triage vital signs and the nursing notes.   HISTORY  Chief Complaint Emesis and Abdominal Pain  Level 5 caveat: History of present illness limited due to altered mental status  HPI Mikayla [name] is a 78 y.o. female with PMH as noted below presents with fever, weakness, and altered mental status as well as nausea and vomiting and some right-sided abdominal pain.  The daughter states it is similar to when she had cholecystitis and possible cholangitis previously.   Past Medical History:  Diagnosis Date  . Atrial fibrillation (HCC)   . Empyema [facility](HCC)    in [facility] Aug 2017- stayed in hospital on for 2 months.  . Hypertension     Patient Active Problem List   Diagnosis Date Noted  . HTN (hypertension) 11/02/2018  . PAF (paroxysmal atrial fibrillation) (HCC) 11/02/2018  . Renal hemorrhage, right 11/02/2018  . Acute cholangitis   . Calculus of bile duct with acute cholangitis with obstruction   . Severe sepsis (HCC) 08/19/2018  . Acute respiratory failure (HCC) 08/10/2018  . Calculus of bile duct without cholangitis or cholecystitis with obstruction   . Cellulitis 11/20/2016  . Pressure injury of skin 09/30/2016  . Palliative care encounter   . Goals of care, counseling/discussion   . Encounter for hospice care discussion     Past Surgical History:  Procedure Laterality Date  . ERCP N/A 08/23/2018   Procedure: ENDOSCOPIC RETROGRADE CHOLANGIOPANCREATOGRAPHY (ERCP);  Surgeon: Midge MiniumWohl, Darren, MD;  [facility];  Service: Endoscopy;  Laterality: N/A;  . PEG TUBE PLACEMENT      Prior to Admission medications   Medication Sig Start Date End Date Taking? Authorizing Provider  acetaminophen (TYLENOL) 325 MG tablet Take 650 mg by mouth every 4 (four) hours  as needed for mild pain or fever.   Yes [provider]  amiodarone (PACERONE) 200 MG tablet Take 1 tablet (200 mg total) by mouth daily. 11/22/16  Yes Shaune Pollackhen, Qing, MD  carbidopa-levodopa (SINEMET IR) 10-100 MG tablet Take 1 tablet by mouth 3 (three) times daily. 08/25/18  Yes Sudini, Wardell HeathSrikar, MD  ferrous sulfate 324 (65 Fe) MG TBEC Take 1 tablet by mouth daily.   Yes [provider]  furosemide (LASIX) 20 MG tablet Take 1 tablet (20 mg total) by mouth daily. 10/30/16  Yes Sudini, Wardell HeathSrikar, MD  gabapentin (NEURONTIN) 300 MG capsule Take 300 mg by mouth at bedtime.   Yes [provider]  guaiFENesin (ROBITUSSIN) 100 MG/5ML SOLN Take 10 mLs by mouth every 6 (six) hours as needed for cough or to loosen phlegm. FOR 10 DAYS 11/01/18 11/07/18 Yes [provider]  ipratropium-albuterol (DUONEB) 0.5-2.5 (3) MG/3ML SOLN Take 3 mLs by nebulization 3 (three) times daily.   Yes [provider]  Melatonin 5 MG TABS Take 5 mg by mouth at bedtime.   Yes [provider]  Multiple Vitamins-Minerals (CENTROVITE) TABS Take 1 tablet by mouth daily.   Yes [provider]  nystatin (NYSTATIN) powder Apply topically 2 (two) times daily as needed. APPLY UNDER NECK TWICE DAILY AS NEEDED FOR YEAST   Yes [provider]  omeprazole (PRILOSEC OTC) 20 MG tablet Take 20 mg by mouth 2 (two) times daily.   Yes [provider]  ondansetron (ZOFRAN-ODT) 4 MG disintegrating tablet Take 4 mg by mouth every 6 (six)  hours as needed for nausea or vomiting. FOR 3 DAYS 11/01/18 11/04/18 Yes [provider]  oxymetazoline (AFRIN) 0.05 % nasal spray Place 2 sprays into both nostrils 2 (two) times daily. For 3 days 11/02/18 11/04/18 Yes [provider]  PARoxetine (PAXIL) 30 MG tablet Take 30 mg by mouth daily.    Yes [provider]  potassium chloride SA (K-DUR,KLOR-CON) 20 MEQ tablet Take 20 mEq by mouth daily.   Yes [provider]  senna  (SENOKOT) 8.6 MG TABS tablet Take 1 tablet by mouth daily.   Yes [provider]  traZODone (DESYREL) 50 MG tablet Take 25 mg by mouth at bedtime.   Yes [provider]  warfarin (COUMADIN) 5 MG tablet Take 5 mg by mouth daily at 8 pm.   Yes [provider]  carvedilol (COREG) 3.125 MG tablet Take 1 tablet (3.125 mg total) by mouth 2 (two) times daily with a meal. Patient not taking: Reported on 08/19/2018 08/12/18   Altamese Dilling, MD  Multiple Vitamin (MULTIVITAMIN WITH MINERALS) TABS tablet Take 1 tablet by mouth daily.    [provider]    Allergies Levaquin [levofloxacin]; Other; Sulfa antibiotics; and Oxycodone  Family History  Problem Relation Age of Onset  . Hypertension Mother   . COPD Mother   . Lung cancer Father   . Liver cancer Sister     Social History Social History   Tobacco Use  . Smoking status: Former Smoker    Types: Cigarettes  . Smokeless tobacco: Never Used  . Tobacco comment: quit in 2001. smoked for 30 years  Substance Use Topics  . Alcohol use: No  . Drug use: No    Review of Systems Level 5 caveat: Review of systems limited due to altered mental status Constitutional: Positive for fever Respiratory: Positive for shortness of breath. Gastrointestinal: Positive for nausea. Skin: Negative for rash. Neurological: Negative for headache.   ____________________________________________   PHYSICAL EXAM:  VITAL SIGNS: ED Triage Vitals  Enc Vitals Group     BP 11/02/18 1917 (!) 81/58     Pulse Rate 11/02/18 1917 (!) 108     Resp 11/02/18 1917 (!) 22     Temp 11/02/18 1853 (!) 100.8 F (38.2 C)     Temp Source 11/02/18 1853 Rectal     SpO2 11/02/18 1917 95 %     Weight --      Height 11/02/18 1854 5\' 4"  (1.626 m)     Head Circumference --      Peak Flow --      Pain Score 11/02/18 1854 6     Pain Loc --      Pain Edu? --      Excl. in GC? --     Constitutional: Alert, slightly confused  appearing.  Weak and pale appearing but in no acute distress. Eyes: Conjunctivae are normal.  No scleral icterus. Head: Atraumatic. Nose: No congestion/rhinnorhea. Mouth/Throat: Mucous membranes are dry.   Neck: Normal range of motion.  Cardiovascular: Tachycardic, regular rhythm. Grossly normal heart sounds.  Good peripheral circulation. Respiratory: Normal respiratory effort.  No retractions.  Decreased breath sounds bilaterally. Gastrointestinal: Soft with mild right lateral abdominal tenderness. No distention.  Genitourinary: No flank tenderness. Musculoskeletal: Extremities warm and well perfused.  Neurologic: Motor intact in all extremities. Skin:  Skin is warm and dry. No rash noted. Psychiatric: Unable to assess.  ____________________________________________   LABS (all labs ordered are listed, but only abnormal results are displayed)  Labs Reviewed  CBC WITH DIFFERENTIAL/PLATELET - Abnormal; Notable for the following components:      Result Value   WBC 35.9 (*)    RBC 3.23 (*)    Hemoglobin 8.8 (*)    HCT 30.6 (*)    MCHC 28.8 (*)    Platelets 447 (*)    Neutro Abs 30.5 (*)    Monocytes Absolute 1.2 (*)    Abs Immature Granulocytes 1.17 (*)    All other components within normal limits  PROTIME-INR - Abnormal; Notable for the following components:   Prothrombin Time 31.2 (*)    All other components within normal limits  HEPATIC FUNCTION PANEL - Abnormal; Notable for the following components:   Albumin 3.3 (*)    AST 212 (*)    ALT 83 (*)    All other components within normal limits  BASIC METABOLIC PANEL - Abnormal; Notable for the following components:   Chloride 93 (*)    Glucose, Bld 241 (*)    BUN 30 (*)    Creatinine, Ser 3.03 (*)    Calcium 8.6 (*)    GFR calc non Af Amer 14 (*)    GFR calc Af Amer 16 (*)    Anion gap 19 (*)    All other components within normal limits  GLUCOSE, CAPILLARY - Abnormal; Notable for the following components:    Glucose-Capillary 113 (*)    All other components within normal limits  CG4 I-STAT (LACTIC ACID) - Abnormal; Notable for the following components:   Lactic Acid, Venous 8.39 (*)    All other components within normal limits  CULTURE, BLOOD (ROUTINE X 2)  CULTURE, BLOOD (ROUTINE X 2)  URINE CULTURE  MRSA PCR SCREENING  LIPASE, BLOOD  URINALYSIS, COMPLETE (UACMP) WITH MICROSCOPIC  LACTIC ACID, PLASMA  LACTIC ACID, PLASMA  PROCALCITONIN  PROCALCITONIN  CBC  BASIC METABOLIC PANEL  HEMOGLOBIN AND HEMATOCRIT, BLOOD  HEMOGLOBIN AND HEMATOCRIT, BLOOD  HEMOGLOBIN AND HEMATOCRIT, BLOOD  URINALYSIS, ROUTINE W REFLEX MICROSCOPIC  HEMOGLOBIN AND HEMATOCRIT, BLOOD  HEMOGLOBIN AND HEMATOCRIT, BLOOD  I-STAT CG4 LACTIC ACID, ED  I-STAT CG4 LACTIC ACID, ED   ____________________________________________  EKG  ED ECG REPORT I, Dionne BucySebastian Nickol Collister, the attending physician, personally viewed and interpreted this ECG.  Date: 11/02/2018 EKG Time: 1853 Rate: 117 Rhythm: Sinus tachycardia QRS Axis: normal Intervals: normal ST/T Wave abnormalities: normal Narrative Interpretation: no evidence of acute ischemia  ____________________________________________  RADIOLOGY   CT abdomen: Right renal hemorrhage US RUQ: Cholelithiasis with no evidence of acute cholecystitis. CXR: Low lung volumes.  No focal infiltrate. ____________________________________________   PROCEDURES  Procedure(s) performed: No  Procedures  Critical Care performed: Yes  CRITICAL CARE Performed by: Dionne BucySebastian Karlie Aung   Total critical care time: 60 minutes  Critical care time was exclusive of separately billable procedures and treating other patients.  Critical care was necessary to treat or prevent imminent or life-threatening deterioration.  Critical care was time spent personally by me on the following activities: development of treatment plan with patient and/or surrogate as well as nursing, discussions  with consultants, evaluation of patient's response to treatment, examination of patient, obtaining history from patient or surrogate, ordering and performing treatments and interventions, ordering and review of laboratory studies, ordering and review of radiographic studies, pulse oximetry and re-evaluation of patient's condition.  ____________________________________________   INITIAL IMPRESSION / ASSESSMENT AND PLAN / ED COURSE  Pertinent labs & imaging results that were available during my care of the patient were reviewed by me  and considered in my medical decision making (see chart for details).  78 year old female with PMH as noted above presents with fever, generalized weakness, some altered mental status, as well as nausea and vomiting.  She also reports some right-sided abdominal pain and shortness of breath.  The daughter states that the presentation is quite similar to when the patient had cholecystitis about 3 months ago.  I reviewed the past medical records in Epic.  The patient was admitted in October of last year with sepsis due to cholangitis.  She was not considered a good surgical candidate and was treated with conservative management, supportive care, and antibiotics.  On exam the patient is alert but weak and slightly confused appearing.  She initially was hypotensive and tachycardic and she has a low-grade fever.  Her abdomen is soft with some mild right-sided tenderness.  She has dry mucous membranes.  The remainder of the exam is as described above.    Overall presentation is consistent with infection/sepsis.  I-STAT lactate was significantly elevated so I started fluids.  I suspect most likely intra-abdominal source given the patient's history and the nature of the symptoms and I started empiric antibiotics for intra-abdominal infection.  We will obtain CT and ultrasound of the abdomen as well as the remainder of the sepsis work-up and reassess.  Anticipate likely admission.     ----------------------------------------- 11:46 PM on 11/02/2018 -----------------------------------------  Initial work-up confirmed findings consistent with sepsis, with elevated WBC count and lactate.  The patient continued to be slightly hypotensive despite fluids.  Work-up also revealed elevated creatinine and hemoglobin of 8.8 down from 11 at baseline.  Her bilirubin and alkaline phosphatase were not elevated, making hepatobiliary etiology less likely.   CT abdomen then revealed right renal hemorrhage.  I consulted Dr. Apolinar Junes from urology who reviewed the images.  She advised that conservative management was recommended including reversal of the patient's Coumadin and monitoring of her hemoglobin.  She advised that if the hemoglobin drop the patient might need IR procedural intervention.  Review of the past records revealed that the patient had prior blood cultures with multidrug-resistant Klebsiella, so I broadened her antibiotic coverage with meropenem.  I counseled the family and patient extensively on the results of the work-up and the plan of care.  I discussed the patient with Dr. Anne Hahn who took signout and agreed to admit the patient.   ____________________________________________   FINAL CLINICAL IMPRESSION(S) / ED DIAGNOSES  Final diagnoses:  Cholecystitis  Sepsis, due to unspecified organism, unspecified whether acute organ dysfunction present (HCC)  Hematoma of right kidney, initial encounter      NEW MEDICATIONS STARTED DURING THIS VISIT:  Current Discharge Medication List       Note:  This document was prepared using Dragon voice recognition software and may include unintentional dictation errors.    Dionne Bucy, MD 11/02/18 [phone]

## 2018-11-02 NOTE — Consult Note (Signed)
PULMONARY / CRITICAL CARE MEDICINE   Name: Mikayla [name] MRN: [id number] DOB: 1941-05-08    ADMISSION DATE:  11/02/2018 CONSULTATION DATE:  11/02/2018  REFERRING MD:  Dr. Anne Hahn  CHIEF COMPLAINT:  Abdomina pain, vomiting  BRIEF DISCUSSION: 78 y.o. Female, DNR, admitted with severe Sepsis of unknown source (suspect UTI vs abdominal), right renal hemorrhage in setting of outpatient warfarin for A-fib, AKI, and Cholelithiasis.  HISTORY OF PRESENT ILLNESS:   Mikayla [name] is a 78 y.o. Female with a PMH of A-fib on warfarin, Empyema, and Hypertension who presents from [facility] due to c/o right sided abdominal pain, nausea, vomiting, fever, and altered mental status.  Pt is a poor historian therefore history is obtained from ED and nursing notes. Upon arrival she was noted to be febrile and hypotensive, meeting sepsis criteria. She received IVF and IV antibiotics with noted improvement in her BP.  Initial workup in the ED revealed WBC 35.9, Lactic acid 8.39, Creatinine 3.03, Hgb 8.8, INR 3.06, AST 212, ALT 83.  CXR was concerning for mild vascular congestion and left base nodule consistent with granuloma (previously noted on CT Chest). Ultrasound of the abdomen revealed Cholelithiasis (no evidence of Cholecystitis) and steatosis of the liver. CT Abdomen & Pelvis revealed acute hemorrhage of the right kidney, with some bleeding into the pararenal space and large hematoma.  The bleed was attributed to coagulopathy from outpatient Warfarin for A-fib.  She was given Vitamin K and Kcentra for reversal in ED.  Urology evaluated her in the ED and recommended conservative management, with the recommendation to consult vascular surgery is she became hemodynamically unstable.  She is being admitted to [facility] ICU for treatment of Septic Shock (suspected urinary source), right renal hemorrhage, AKI, and Cholelithiasis.  PCCM is consulted for further management.    Of note, pt with sepsis secondary to  Cholangitis in October 2019, of which she was managed conservatively with antibiotics and supportive care.  Was considered not good surgical candidate at that time.  PAST MEDICAL HISTORY :  She  has a past medical history of Atrial fibrillation (HCC), Empyema (HCC), and Hypertension.  PAST SURGICAL HISTORY: She  has a past surgical history that includes PEG tube placement and ERCP (N/A, 08/23/2018).  Allergies  Allergen Reactions  . Levaquin [Levofloxacin]   . Other Nausea And Vomiting  . Sulfa Antibiotics Hives  . Oxycodone Anxiety and Other (See Comments)    No current facility-administered medications on file prior to encounter.    Current Outpatient Medications on File Prior to Encounter  Medication Sig  . acetaminophen (TYLENOL) 325 MG tablet Take 650 mg by mouth every 4 (four) hours as needed for mild pain or fever.  Marland Kitchen amiodarone (PACERONE) 200 MG tablet Take 1 tablet (200 mg total) by mouth daily.  . carbidopa-levodopa (SINEMET IR) 10-100 MG tablet Take 1 tablet by mouth 3 (three) times daily.  . ferrous sulfate 324 (65 Fe) MG TBEC Take 1 tablet by mouth daily.  . furosemide (LASIX) 20 MG tablet Take 1 tablet (20 mg total) by mouth daily.  Marland Kitchen gabapentin (NEURONTIN) 300 MG capsule Take 300 mg by mouth at bedtime.  Marland Kitchen guaiFENesin (ROBITUSSIN) 100 MG/5ML SOLN Take 10 mLs by mouth every 6 (six) hours as needed for cough or to loosen phlegm. FOR 10 DAYS  . ipratropium-albuterol (DUONEB) 0.5-2.5 (3) MG/3ML SOLN Take 3 mLs by nebulization 3 (three) times daily.  . Melatonin 5 MG TABS Take 5 mg by mouth at bedtime.  Marland Kitchen  Multiple Vitamins-Minerals (CENTROVITE) TABS Take 1 tablet by mouth daily.  Marland Kitchen nystatin (NYSTATIN) powder Apply topically 2 (two) times daily as needed. APPLY UNDER NECK TWICE DAILY AS NEEDED FOR YEAST  . omeprazole (PRILOSEC OTC) 20 MG tablet Take 20 mg by mouth 2 (two) times daily.  . ondansetron (ZOFRAN-ODT) 4 MG disintegrating tablet Take 4 mg by mouth every 6 (six)  hours as needed for nausea or vomiting. FOR 3 DAYS  . oxymetazoline (AFRIN) 0.05 % nasal spray Place 2 sprays into both nostrils 2 (two) times daily. For 3 days  . PARoxetine (PAXIL) 30 MG tablet Take 30 mg by mouth daily.   . potassium chloride SA (K-DUR,KLOR-CON) 20 MEQ tablet Take 20 mEq by mouth daily.  Marland Kitchen senna (SENOKOT) 8.6 MG TABS tablet Take 1 tablet by mouth daily.  . traZODone (DESYREL) 50 MG tablet Take 25 mg by mouth at bedtime.  Marland Kitchen warfarin (COUMADIN) 5 MG tablet Take 5 mg by mouth daily at 8 pm.  . carvedilol (COREG) 3.125 MG tablet Take 1 tablet (3.125 mg total) by mouth 2 (two) times daily with a meal. (Patient not taking: Reported on 08/19/2018)  . Multiple Vitamin (MULTIVITAMIN WITH MINERALS) TABS tablet Take 1 tablet by mouth daily.    FAMILY HISTORY:  Her She indicated that her mother is deceased. She indicated that her father is deceased. She indicated that her sister is deceased.   SOCIAL HISTORY: She  reports that she has quit smoking. Her smoking use included cigarettes. She has never used smokeless tobacco. She reports that she does not drink alcohol or use drugs.  REVIEW OF SYSTEMS:   Positives in BOLD:  Pt denies all complaints  Gen: Denies fever, chills, weight change, fatigue, night sweats HEENT: Denies blurred vision, double vision, hearing loss, tinnitus, sinus congestion, rhinorrhea, sore throat, neck stiffness, dysphagia PULM: Denies shortness of breath, cough, sputum production, hemoptysis, wheezing CV: Denies chest pain, edema, orthopnea, paroxysmal nocturnal dyspnea, palpitations GI: Denies abdominal pain, nausea, vomiting, diarrhea, hematochezia, melena, constipation, change in bowel habits GU: Denies dysuria, hematuria, polyuria, oliguria, urethral discharge Endocrine: Denies hot or cold intolerance, polyuria, polyphagia or appetite change Derm: Denies rash, dry skin, scaling or peeling skin change Heme: Denies easy bruising, bleeding, bleeding  gums Neuro: Denies headache, numbness, weakness, slurred speech, loss of memory or consciousness   SUBJECTIVE:  Pt denies fever, chills, shortness of breath, chest pain, nausea, vomiting, or abdominal pain Pt on Nasal cannula  VITAL SIGNS: BP (!) 84/53   Pulse (!) 107   Temp (!) 100.8 F (38.2 C) (Rectal)   Resp 20   Ht 5\' 4"  (1.626 m)   SpO2 99%   BMI 33.57 kg/m   HEMODYNAMICS:    VENTILATOR SETTINGS:    INTAKE / OUTPUT: No intake/output data recorded.  PHYSICAL EXAMINATION: General:  Acute on chronically ill appearing female, laying in bed, on nasal cannula, in NAD Neuro:  Lethargic, arouses to voice, Oriented to person and time, follows commands, weakness and limited ROM to LUE (which is pt's baseline), speech clear HEENT:  Atraumatic, normocephalic, neck supple, no JVD Cardiovascular:  RRR, s1s2, no M/R/G, 2+ radial pulses, 1+ pedal pulses Lungs:  Coarse to ausculation throughout, even, nonlabored, normal effort Abdomen:  Obese, soft, tenderness to RUQ, BS hypoactive Musculoskeletal:  Extremities warm and well perfused, deformity to left hand Skin:  Warm/dry.  No obvious rashes, lesions, or ulcerations  LABS:  BMET Recent Labs  Lab 11/02/18 1905  NA 135  K 4.8  CL 93*  CO2 23  BUN 30*  CREATININE 3.03*  GLUCOSE 241*    Electrolytes Recent Labs  Lab 11/02/18 1905  CALCIUM 8.6*    CBC Recent Labs  Lab 11/02/18 1856  WBC 35.9*  HGB 8.8*  HCT 30.6*  PLT 447*    Coag's Recent Labs  Lab 11/02/18 1856  INR 3.06    Sepsis Markers Recent Labs  Lab 11/02/18 1909  LATICACIDVEN 8.39*    ABG No results for input(s): PHART, PCO2ART, PO2ART in the last 168 hours.  Liver Enzymes Recent Labs  Lab 11/02/18 1905  AST 212*  ALT 83*  ALKPHOS 83  BILITOT 0.6  ALBUMIN 3.3*    Cardiac Enzymes No results for input(s): TROPONINI, PROBNP in the last 168 hours.  Glucose Recent Labs  Lab 11/02/18 2335  GLUCAP 113*    Imaging Ct  Abdomen Pelvis Wo Contrast  Result Date: 11/02/2018 CLINICAL DATA:  Acute abdominal pain.  Fever.  On Coumadin. EXAM: CT ABDOMEN AND PELVIS WITHOUT CONTRAST TECHNIQUE: Multidetector CT imaging of the abdomen and pelvis was performed following the standard protocol without IV contrast. COMPARISON:  CT abdomen pelvis 08/21/2018 FINDINGS: Lower chest: Calcified granuloma left lower lobe. Multifocal nodular densities right lung base posteriorly have progressed. Probable mucous plugging. No effusion. Hepatobiliary: Small calcified gallstones. No biliary dilatation. No focal liver lesion. Pancreas: Pancreatic atrophy.  No mass or edema Spleen: Negative Adrenals/Urinary Tract: Hyperdense right renal hemorrhage has developed since the prior study. This involves the upper mid and lower pole extends into the perirenal space laterally. Hematoma is also present in the retroperitoneum posteriorly which appears to connect with the upper pole of the right renal hemorrhage. Posterior pararenal hematoma measures approximately 9 x 11 x 16 cm and also is hyperdense. No underlying renal lesion is seen on the recent CT. Patient is on Coumadin with INR of 3. Left kidney is atrophic without focal abnormality. Bladder is empty. Stomach/Bowel: Negative for bowel obstruction. No bowel mass or edema. Vascular/Lymphatic: Atherosclerotic aorta without aneurysm. No lymphadenopathy. Reproductive: Normal uterus.  No pelvic mass Other: Small amount of free fluid in the pelvis. 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 Musculoskeletal: Diffuse lumbar degenerative changes. No acute skeletal abnormality. IMPRESSION: Acute hemorrhage in the right kidney. Hemorrhage has ruptured into the posterior pararenal space where there is a large acute hematoma. No underlying renal mass is seen on recent CT of 08/21/2018. Hemorrhage is likely due to coagulopathy. These results were called by telephone at the time of interpretation on 11/02/2018 at 8:45 pm to Dr.  Dionne Bucy , who verbally acknowledged these results. Electronically Signed   By: Marlan Palau M.D.   On: 11/02/2018 20:45   Dg Chest Portable 1 View  Result Date: 11/02/2018 CLINICAL DATA:  Fever and hypotension EXAM: PORTABLE CHEST 1 VIEW COMPARISON:  11/02/2018 CT AP FINDINGS: Lung volumes are low. The patient is slightly rotated on this exam. Left atrial enlargement is identified. Aortic atherosclerosis without aneurysm. Mild vascular congestion without acute pulmonary consolidation. Nodule at the left lung base consistent with a granuloma based on same day CT. Atelectasis is noted at the right lung base. Remote posttraumatic deformity of the right fifth rib. IMPRESSION: 1. Low lung volumes with atelectasis at the right lung base. Mild vascular congestion. 2. Dense nodule at the left lung base consistent with a granuloma as seen on same day CT. Electronically Signed   By: Tollie Eth M.D.   On: 11/02/2018 22:09   US Abdomen Limited  Ruq  Result Date: 11/02/2018 CLINICAL DATA:  78 y/o  F; evaluation of cholecystitis. EXAM: ULTRASOUND ABDOMEN LIMITED RIGHT UPPER QUADRANT COMPARISON:  11/02/2018 CT abdomen and pelvis. FINDINGS: Gallbladder: Cholelithiasis. Collapsed gallbladder. Gallbladder wall measures 2.5 mm in thickness. No pericholecystic fluid. Common bile duct: Diameter: 2.5 mm Liver: Increased liver echogenicity. No focal liver lesion identified. Trace perihepatic fluid. Portal vein is patent on color Doppler imaging with normal direction of blood flow towards the liver. Other: Partially visualized collection within the right renal fossa. IMPRESSION: 1. Cholelithiasis.  No secondary signs of acute cholecystitis. 2. Echogenic liver compatible with steatosis. 3. Partially visualized right retroperitoneal collection corresponding to hematoma on CT. Electronically Signed   By: Mitzi HansenLance  Furusawa-Stratton M.D.   On: 11/02/2018 21:07     STUDIES:  CT Abdomen/Pelvis 11/02/18>>Acute hemorrhage in the  right kidney. Hemorrhage has ruptured into the posterior pararenal space where there is a large acute hematoma. No underlying renal mass is seen on recent CT of 08/21/2018. Hemorrhage is likely due to coagulopathy. US Abdomen 11/02/18>>  Cholelithiasis.  No secondary signs of acute cholecystitis. Echogenic liver compatible with steatosis. Partially visualized right retroperitoneal collection corresponding to hematoma on CT.  CULTURES: Blood x2 11/02/18>> Urine 11/02/18>>  ANTIBIOTICS: Cefepime 1/8>> x1 dose Meropenem 1/8>> Flagyl 1/8>> Vancomycin 1/8>>  SIGNIFICANT EVENTS: 11/02/18>> [facility]  LINES/TUBES:   ASSESSMENT / PLAN:  PULMONARY A: CXR with mild vascular congestion and Left base nodule consistent with granuloma (granuloma noted on previous CT) Hx: Empyema P:   Supplemental O2 as needed to maintain O2 sats >92% Follow intermittent CXR and ABG as needed Incentive spirometry  CARDIOVASCULAR A:  Septic shock +/- hemorrhagic shock (in setting of renal hemorrhage) Hx: HTN, A-fib, CHF P:  Cardiac monitoring Maintain MAP >65 Received 1L NS bolus in ED; will hold off on additional fluid boluses given CHF hx and pt currently coarse lung sounds Gentle IVF: NS @ 100 ml/hr Levophed if needed to maintain MAP goal Transfuse pRBC's for Hgb <8 H&H q6h Obtain echocardiogram  RENAL A:   AKI Anion gap Metabolic acidosis in setting of lactic acidosis Right Renal Hemorrhage P:   Monitor I&O's / urinary output Follow BMP Ensure adequate renal perfusion Avoid nephrotoxic agents as able Replace electrolytes as indicated Obtain renal ultrasound Trend Lactic acid Follow H&H q6h Received Vitamin K and Kcentra Urology following, appreciate input  GASTROINTESTINAL A:   Elevated LFT's Acute Cholelithiasis -Abdominal US with cholelithiasis (no signs of cholecystitis) and steatosis of liver P:   Keep NPO for now Trend LFT's IV Protonix BID  HEMATOLOGIC A:   Acute  anemia in setting of right Renal Hemorrhage secondary to coagulopathy due to Warfarin P:  Monitor for S/Sx of bleeding Trend CBC Follow H&H q6h Received Vitamin K and Kcentra SCD's for VTE Prophylaxis (no chemical prophylaxis given renal hemorrhage) Transfuse for Hgb <8 Urology following, appreciate input   INFECTIOUS A:   Severe sepsis, unclear source at this time, suspect UTI; UA is to be collected P:   Monitor fever curve Trend WBC's and Procalcitonin Follow cultures as above Received Cefepime x1 dose in ED Continue Meropenem and Flagyl, will add Vancomycin  ENDOCRINE A:   Hyperglycemia   P:   CBG's SSI Follow ICU hypo/hyperglycemia protocol  NEUROLOGIC A:   Acute metabolic encephalopathy in setting of sepsis Hx: Parkinson's P:   Provide supportive care Avoid sedating meds as able Treat sepsis Continue home Sinemet   FAMILY  - Updates: Updated pt's daughters at  bedside 11/03/18.  Her daughters confirm Pt is a DNR/DNI.  - Inter-disciplinary family meet or Palliative Care meeting due by:  11/09/18    Harlon DittyJeremiah , AGACNP-BC [facility] Pager: [phone] Cell: [phone]  11/02/2018, 11:36 PM

## 2018-11-02 NOTE — Progress Notes (Signed)
CODE SEPSIS - PHARMACY COMMUNICATION  **Broad Spectrum Antibiotics should be administered within 1 hour of Sepsis diagnosis**  Time Code Sepsis Called/Page Received: 1/8 @ 1939  Antibiotics Ordered: cefepime  Time of 1st antibiotic administration: 1/8@1958   Additional action taken by pharmacy: none    Ronnald RampKishan S Mahdi Frye ,PharmD Clinical Pharmacist  11/02/2018  8:03 PM sp

## 2018-11-02 NOTE — ED Notes (Signed)
ED TO INPATIENT HANDOFF REPORT  Name/Age/Gender Mikayla [name] 78 y.o. female  Code Status Code Status History    Date Active Date Inactive Code Status Order ID Comments User Context   08/19/2018 1806 08/25/2018 1545 DNR [id number]  Houston Siren, MD ED   08/10/2018 0111 08/12/2018 2135 DNR [id number]  Cammy Copa, MD Inpatient   11/20/2016 0904 11/21/2016 1854 DNR [id number]  Arnaldo Natal, MD Inpatient   10/24/2016 0307 10/30/2016 2126 DNR [id number]  Shaune Pollack, MD Inpatient   09/28/2016 1555 10/02/2016 1523 DNR [id number]  Altamese Dilling, MD Inpatient    Questions for Most Recent Historical Code Status (Order [id number])    Question Answer Comment   In the event of cardiac or respiratory ARREST Do not call a "code blue"    In the event of cardiac or respiratory ARREST Do not perform Intubation, CPR, defibrillation or ACLS    In the event of cardiac or respiratory ARREST Use medication by any route, position, wound care, and other measures to relive pain and suffering. May use oxygen, suction and manual treatment of airway obstruction as needed for comfort.         Advance Directive Documentation     Most Recent Value  Type of Advance Directive  Out of facility DNR (pink MOST or yellow form), Healthcare Power of Attorney  Pre-existing out of facility DNR order (yellow form or pink MOST form)  Yellow form placed in chart (order not valid for inpatient use)  "MOST" Form in Place?  -      Home/SNF/Other Nursing Home  Chief Complaint Weakness  Level of Care/Admitting Diagnosis ED Disposition    ED Disposition Condition Comment   Admit  Hospital Area: [facility] [[id number]]  Level of Care: ICU [6]  Diagnosis: Severe sepsis [facility]) [[id number]]  Admitting Physician: Oralia Manis [[id number]]  Attending Physician: Oralia Manis [phone]  Estimated length of stay: past midnight tomorrow  Certification:: I certify this patient will need inpatient  services for at least 2 midnights  PT Class (Do Not Modify): Inpatient [101]  PT Acc Code (Do Not Modify): Private [1]       Medical History Past Medical History:  Diagnosis Date  . Atrial fibrillation (HCC)   . Empyema [facility])    in [facility] Aug 2017- stayed in hospital on for 2 months.  . Hypertension     Allergies Allergies  Allergen Reactions  . Levaquin [Levofloxacin]   . Other Nausea And Vomiting  . Sulfa Antibiotics Hives  . Oxycodone Anxiety and Other (See Comments)    IV Location/Drains/Wounds Patient Lines/Drains/Airways Status   Active Line/Drains/Airways    Name:   Placement date:   Placement time:   Site:   Days:   Peripheral IV 11/02/18 Left Antecubital   11/02/18    1904    Antecubital   less than 1   Peripheral IV 11/02/18 Right Antecubital   11/02/18    2310    Antecubital   less than 1   PICC Single Lumen 08/24/18 PICC Right Cephalic 38 cm 0 cm   08/24/18    1540    Cephalic   70          Labs/Imaging Results for orders placed or performed during the hospital encounter of 11/02/18 (from the past 48 hour(s))  CBC with Differential     Status: Abnormal   Collection Time: 11/02/18  6:56 PM  Result Value Ref Range   WBC 35.9 (H) 4.0 - 10.5  K/uL   RBC 3.23 (L) 3.87 - 5.11 MIL/uL   Hemoglobin 8.8 (L) 12.0 - 15.0 g/dL   HCT 40.930.6 (L) 81.136.0 - 91.446.0 %   MCV 94.7 80.0 - 100.0 fL   MCH 27.2 26.0 - 34.0 pg   MCHC 28.8 (L) 30.0 - 36.0 g/dL   RDW 78.215.1 95.611.5 - 21.315.5 %   Platelets 447 (H) 150 - 400 K/uL   nRBC 0.1 0.0 - 0.2 %   Neutrophils Relative % 85 %   Neutro Abs 30.5 (H) 1.7 - 7.7 K/uL   Lymphocytes Relative 8 %   Lymphs Abs 2.9 0.7 - 4.0 K/uL   Monocytes Relative 4 %   Monocytes Absolute 1.2 (H) 0.1 - 1.0 K/uL   Eosinophils Relative 0 %   Eosinophils Absolute 0.0 0.0 - 0.5 K/uL   Basophils Relative 0 %   Basophils Absolute 0.1 0.0 - 0.1 K/uL   WBC Morphology MORPHOLOGY UNREMARKABLE    RBC Morphology MORPHOLOGY UNREMARKABLE    Smear Review Normal platelet  morphology    Immature Granulocytes 3 %   Abs Immature Granulocytes 1.17 (H) 0.00 - 0.07 K/uL    Comment: Performed at [facility], [address] [place], [place] [address]  Protime-INR     Status: Abnormal   Collection Time: 11/02/18  6:56 PM  Result Value Ref Range   Prothrombin Time 31.2 (H) 11.4 - 15.2 seconds   INR 3.06     Comment: Performed at [facility], [address] [place], [place] [address]  Hepatic function panel     Status: Abnormal   Collection Time: 11/02/18  7:05 PM  Result Value Ref Range   Total Protein 7.0 6.5 - 8.1 g/dL   Albumin 3.3 (L) 3.5 - 5.0 g/dL   AST 295212 (H) 15 - 41 U/L   ALT 83 (H) 0 - 44 U/L   Alkaline Phosphatase 83 38 - 126 U/L   Total Bilirubin 0.6 0.3 - 1.2 mg/dL   Bilirubin, Direct 0.1 0.0 - 0.2 mg/dL   Indirect Bilirubin 0.5 0.3 - 0.9 mg/dL    Comment: Performed at [facility], [address] [place], [place] [address]  Lipase, blood     Status: None   Collection Time: 11/02/18  7:05 PM  Result Value Ref Range   Lipase 18 11 - 51 U/L    Comment: Performed at [facility], [address] [place], [place] [address]  Basic metabolic panel     Status: Abnormal   Collection Time: 11/02/18  7:05 PM  Result Value Ref Range   Sodium 135 135 - 145 mmol/L   Potassium 4.8 3.5 - 5.1 mmol/L   Chloride 93 (L) 98 - 111 mmol/L   CO2 23 22 - 32 mmol/L   Glucose, Bld 241 (H) 70 - 99 mg/dL   BUN 30 (H) 8 - 23 mg/dL   Creatinine, Ser 0.273.03 (H) 0.44 - 1.00 mg/dL   Calcium 8.6 (L) 8.9 - 10.3 mg/dL   GFR calc non Af Amer 14 (L) >60 mL/min   GFR calc Af Amer 16 (L) >60 mL/min   Anion gap 19 (H) 5 - 15    Comment: Performed at [facility], [address] [place], [place] [address]  CG4 I-STAT (Lactic acid)     Status: Abnormal   Collection Time: 11/02/18  7:09 PM  Result Value Ref Range   Lactic Acid, Venous 8.39 (HH) 0.5 - 1.9 mmol/L   Comment NOTIFIED  PHYSICIAN    Ct Abdomen Pelvis Wo  Contrast  Result Date: 11/02/2018 CLINICAL DATA:  Acute abdominal pain.  Fever.  On Coumadin. EXAM: CT ABDOMEN AND PELVIS WITHOUT CONTRAST TECHNIQUE: Multidetector CT imaging of the abdomen and pelvis was performed following the standard protocol without IV contrast. COMPARISON:  CT abdomen pelvis 08/21/2018 FINDINGS: Lower chest: Calcified granuloma left lower lobe. Multifocal nodular densities right lung base posteriorly have progressed. Probable mucous plugging. No effusion. Hepatobiliary: Small calcified gallstones. No biliary dilatation. No focal liver lesion. Pancreas: Pancreatic atrophy.  No mass or edema Spleen: Negative Adrenals/Urinary Tract: Hyperdense right renal hemorrhage has developed since the prior study. This involves the upper mid and lower pole extends into the perirenal space laterally. Hematoma is also present in the retroperitoneum posteriorly which appears to connect with the upper pole of the right renal hemorrhage. Posterior pararenal hematoma measures approximately 9 x 11 x 16 cm and also is hyperdense. No underlying renal lesion is seen on the recent CT. Patient is on Coumadin with INR of 3. Left kidney is atrophic without focal abnormality. Bladder is empty. Stomach/Bowel: Negative for bowel obstruction. No bowel mass or edema. Vascular/Lymphatic: Atherosclerotic aorta without aneurysm. No lymphadenopathy. Reproductive: Normal uterus.  No pelvic mass Other: Small amount of free fluid in the pelvis. 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 Musculoskeletal: Diffuse lumbar degenerative changes. No acute skeletal abnormality. IMPRESSION: Acute hemorrhage in the right kidney. Hemorrhage has ruptured into the posterior pararenal space where there is a large acute hematoma. No underlying renal mass is seen on recent CT of 08/21/2018. Hemorrhage is likely due to coagulopathy. These results were called by telephone at the time of interpretation on 11/02/2018 at 8:45 pm to Dr. Dionne Bucy , who verbally acknowledged these results. Electronically Signed   By: Marlan Palau M.D.   On: 11/02/2018 20:45   Dg Chest Portable 1 View  Result Date: 11/02/2018 CLINICAL DATA:  Fever and hypotension EXAM: PORTABLE CHEST 1 VIEW COMPARISON:  11/02/2018 CT AP FINDINGS: Lung volumes are low. The patient is slightly rotated on this exam. Left atrial enlargement is identified. Aortic atherosclerosis without aneurysm. Mild vascular congestion without acute pulmonary consolidation. Nodule at the left lung base consistent with a granuloma based on same day CT. Atelectasis is noted at the right lung base. Remote posttraumatic deformity of the right fifth rib. IMPRESSION: 1. Low lung volumes with atelectasis at the right lung base. Mild vascular congestion. 2. Dense nodule at the left lung base consistent with a granuloma as seen on same day CT. Electronically Signed   By: Tollie Eth M.D.   On: 11/02/2018 22:09   US Abdomen Limited Ruq  Result Date: 11/02/2018 CLINICAL DATA:  78 y/o  F; evaluation of cholecystitis. EXAM: ULTRASOUND ABDOMEN LIMITED RIGHT UPPER QUADRANT COMPARISON:  11/02/2018 CT abdomen and pelvis. FINDINGS: Gallbladder: Cholelithiasis. Collapsed gallbladder. Gallbladder wall measures 2.5 mm in thickness. No pericholecystic fluid. Common bile duct: Diameter: 2.5 mm Liver: Increased liver echogenicity. No focal liver lesion identified. Trace perihepatic fluid. Portal vein is patent on color Doppler imaging with normal direction of blood flow towards the liver. Other: Partially visualized collection within the right renal fossa. IMPRESSION: 1. Cholelithiasis.  No secondary signs of acute cholecystitis. 2. Echogenic liver compatible with steatosis. 3. Partially visualized right retroperitoneal collection corresponding to hematoma on CT. Electronically Signed   By: Mitzi Hansen M.D.   On: 11/02/2018 21:07    Pending Labs Unresulted Labs (From admission, onward)    Start  Ordered   11/02/18 1856  Culture, blood (Routine x 2)  BLOOD CULTURE X 2,   STAT     11/02/18 1856   11/02/18 1856  Urinalysis, Complete w Microscopic  ONCE - STAT,   STAT     11/02/18 1856   Signed and Held  Basic metabolic panel  Tomorrow morning,   R     Signed and Held   Signed and Held  CBC  Tomorrow morning,   R     Signed and Held   Signed and Held  Hemoglobin  Now then every 6 hours,   R     Signed and Held          Vitals/Pain Today's Vitals   11/02/18 2200 11/02/18 2215 11/02/18 2230 11/02/18 2245  BP:      Pulse:  (!) 101 94 95  Resp: (!) 21 (!) 22 (!) 22 (!) 23  Temp:      TempSrc:      SpO2:  96% 92% 95%  Height:      PainSc:        Isolation Precautions No active isolations  Medications Medications  metroNIDAZOLE (FLAGYL) IVPB 500 mg (500 mg Intravenous New Bag/Given 11/02/18 2238)  meropenem (MERREM) 1 g in sodium chloride 0.9 % 100 mL IVPB (has no administration in time range)  sodium chloride 0.9 % bolus 1,000 mL (0 mLs Intravenous Stopped 11/02/18 2235)  ipratropium-albuterol (DUONEB) 0.5-2.5 (3) MG/3ML nebulizer solution 3 mL (3 mLs Nebulization Given 11/02/18 1934)  acetaminophen (TYLENOL) tablet 1,000 mg (1,000 mg Oral Given 11/02/18 1958)  ceFEPIme (MAXIPIME) 2 g in sodium chloride 0.9 % 100 mL IVPB (0 g Intravenous Stopped 11/02/18 2233)  phytonadione (VITAMIN K) 10 mg in dextrose 5 % 50 mL IVPB (10 mg Intravenous New Bag/Given 11/02/18 2209)  prothrombin complex conc human (KCENTRA) IVPB 1,644 Units (0 Units Intravenous Stopped 11/02/18 2210)    Mobility non-ambulatory

## 2018-11-02 NOTE — Progress Notes (Addendum)
eLink Physician-Brief Progress Note Patient Name: BLAKELI SAMPLEY DOB: 1940-11-04 MRN: [id number]   Date of Service  11/02/2018  HPI/Events of Note  77/F with hx of atrial fibrillation on coumadin, presenting with severe sepsis.  Pt started on Cefepime for possible urinary source. CT abdomen done showed acute hematoma on the right pararenal space.   Pt is awake and alert.  She is lying on her side and breathing comfortably.  She is hemodynamically stable.  BP at the time of evaluation 131/58. INR 3. Lactic acid 8.3.  eICU Interventions  Continue Cefepime.  Cultures pending.  Pt given Vit K.  Recheck PT/INR in the AM. Transfuse as needed. Check H&H q8hrs.  Follow lactate until resolution.        Intervention Category Evaluation Type: New Patient Evaluation  Larinda Buttery 11/02/2018, 11:43 PM

## 2018-11-02 NOTE — ED Notes (Signed)
Per MD Siadecki okay with 1 Blood Culture and to go ahead and start IV antibiotics.

## 2018-11-02 NOTE — ED Triage Notes (Signed)
Pt to ED via EMS from [facility]. Pt has Hx of gallstones and sepsis. Pt has been having burping, abd pain, burping and nausea, Pt cool to touch at this time and febrile.

## 2018-11-02 NOTE — ED Notes (Signed)
Pt going to ct at this time.

## 2018-11-03 ENCOUNTER — Other Ambulatory Visit (INDEPENDENT_AMBULATORY_CARE_PROVIDER_SITE_OTHER): Payer: Self-pay | Admitting: Vascular Surgery

## 2018-11-03 ENCOUNTER — Encounter
Admission: EM | Disposition: A | Discharge status: Skilled Nursing Facility | Payer: Self-pay | Source: Skilled Nursing Facility | Attending: Internal Medicine

## 2018-11-03 ENCOUNTER — Inpatient Hospital Stay (HOSPITAL_COMMUNITY)
Admit: 2018-11-03 | Discharge: 2018-11-03 | Disposition: A | Discharge status: Home / Self Care | Payer: Medicare Other | Attending: Pulmonary Disease | Admitting: Pulmonary Disease

## 2018-11-03 ENCOUNTER — Encounter: Payer: Self-pay | Admitting: Vascular Surgery

## 2018-11-03 ENCOUNTER — Inpatient Hospital Stay: Payer: Medicare Other

## 2018-11-03 DIAGNOSIS — A419 Sepsis, unspecified organism: Principal | ICD-10-CM

## 2018-11-03 DIAGNOSIS — I959 Hypotension, unspecified: Secondary | ICD-10-CM

## 2018-11-03 DIAGNOSIS — S37091A Other injury of right kidney, initial encounter: Secondary | ICD-10-CM

## 2018-11-03 DIAGNOSIS — I480 Paroxysmal atrial fibrillation: Secondary | ICD-10-CM

## 2018-11-03 DIAGNOSIS — R6520 Severe sepsis without septic shock: Secondary | ICD-10-CM

## 2018-11-03 DIAGNOSIS — N179 Acute kidney failure, unspecified: Secondary | ICD-10-CM

## 2018-11-03 DIAGNOSIS — N2889 Other specified disorders of kidney and ureter: Secondary | ICD-10-CM

## 2018-11-03 HISTORY — PX: EMBOLIZATION: CATH118239

## 2018-11-03 LAB — BASIC METABOLIC PANEL
ANION GAP: 11 (ref 5–15)
Anion gap: 10 (ref 5–15)
BUN: 35 mg/dL — ABNORMAL HIGH (ref 8–23)
BUN: 36 mg/dL — ABNORMAL HIGH (ref 8–23)
CHLORIDE: 100 mmol/L (ref 98–111)
CO2: 25 mmol/L (ref 22–32)
CO2: 27 mmol/L (ref 22–32)
Calcium: 7.7 mg/dL — ABNORMAL LOW (ref 8.9–10.3)
Calcium: 7.8 mg/dL — ABNORMAL LOW (ref 8.9–10.3)
Chloride: 100 mmol/L (ref 98–111)
Creatinine, Ser: 3.1 mg/dL — ABNORMAL HIGH (ref 0.44–1.00)
Creatinine, Ser: 3.12 mg/dL — ABNORMAL HIGH (ref 0.44–1.00)
GFR calc Af Amer: 16 mL/min — ABNORMAL LOW (ref >60)
GFR calc Af Amer: 16 mL/min — ABNORMAL LOW (ref >60)
GFR calc non Af Amer: 14 mL/min — ABNORMAL LOW (ref >60)
GFR calc non Af Amer: 14 mL/min — ABNORMAL LOW (ref >60)
Glucose, Bld: 162 mg/dL — ABNORMAL HIGH (ref 70–99)
Glucose, Bld: 132 mg/dL — ABNORMAL HIGH (ref 70–99)
Potassium: 5 mmol/L (ref 3.5–5.1)
Potassium: 5 mmol/L (ref 3.5–5.1)
Sodium: 136 mmol/L (ref 135–145)
Sodium: 137 mmol/L (ref 135–145)

## 2018-11-03 LAB — PROTIME-INR
INR: 1.28
INR: 1.26
INR: 1.39
PROTHROMBIN TIME: 15.9 s — AB (ref 11.4–15.2)
Prothrombin Time: 15.7 seconds — ABNORMAL HIGH (ref 11.4–15.2)
Prothrombin Time: 16.9 seconds — ABNORMAL HIGH (ref 11.4–15.2)

## 2018-11-03 LAB — HEMOGLOBIN AND HEMATOCRIT, BLOOD
HCT: 21.1 % — ABNORMAL LOW (ref 36.0–46.0)
HCT: 21.3 % — ABNORMAL LOW (ref 36.0–46.0)
HCT: 25.1 % — ABNORMAL LOW (ref 36.0–46.0)
HEMOGLOBIN: 7.5 g/dL — AB (ref 12.0–15.0)
Hemoglobin: 6.7 g/dL — ABNORMAL LOW (ref 12.0–15.0)
Hemoglobin: 6.6 g/dL — ABNORMAL LOW (ref 12.0–15.0)

## 2018-11-03 LAB — URINALYSIS, COMPLETE (UACMP) WITH MICROSCOPIC
Bilirubin Urine: NEGATIVE
Glucose, UA: NEGATIVE mg/dL
Ketones, ur: NEGATIVE mg/dL
Nitrite: NEGATIVE
PROTEIN: 30 mg/dL — AB
Specific Gravity, Urine: 1.02 (ref 1.005–1.030)
pH: 5 (ref 5.0–8.0)

## 2018-11-03 LAB — CBC
HCT: 22.6 % — ABNORMAL LOW (ref 36.0–46.0)
Hemoglobin: 7 g/dL — ABNORMAL LOW (ref 12.0–15.0)
MCH: 28.3 pg (ref 26.0–34.0)
MCHC: 31 g/dL (ref 30.0–36.0)
MCV: 91.5 fL (ref 80.0–100.0)
NRBC: 0.1 % (ref 0.0–0.2)
Platelets: 345 10*3/uL (ref 150–400)
RBC: 2.47 MIL/uL — ABNORMAL LOW (ref 3.87–5.11)
RDW: 15.1 % (ref 11.5–15.5)
WBC: 41.4 10*3/uL — ABNORMAL HIGH (ref 4.0–10.5)

## 2018-11-03 LAB — HEMOGLOBIN A1C
HEMOGLOBIN A1C: 5.5 % (ref 4.8–5.6)
MEAN PLASMA GLUCOSE: 111.15 mg/dL

## 2018-11-03 LAB — GLUCOSE, CAPILLARY
Glucose-Capillary: 152 mg/dL — ABNORMAL HIGH (ref 70–99)
Glucose-Capillary: 127 mg/dL — ABNORMAL HIGH (ref 70–99)
Glucose-Capillary: 127 mg/dL — ABNORMAL HIGH (ref 70–99)
Glucose-Capillary: 113 mg/dL — ABNORMAL HIGH (ref 70–99)
Glucose-Capillary: 118 mg/dL — ABNORMAL HIGH (ref 70–99)

## 2018-11-03 LAB — LACTIC ACID, PLASMA: LACTIC ACID, VENOUS: 3 mmol/L — AB (ref 0.5–1.9)

## 2018-11-03 LAB — PREPARE RBC (CROSSMATCH)

## 2018-11-03 LAB — PROCALCITONIN: Procalcitonin: 1.09 ng/mL

## 2018-11-03 LAB — MRSA PCR SCREENING: MRSA by PCR: NEGATIVE

## 2018-11-03 LAB — HEMOGLOBIN: Hemoglobin: 7.6 g/dL — ABNORMAL LOW (ref 12.0–15.0)

## 2018-11-03 SURGERY — EMBOLIZATION
Anesthesia: Moderate Sedation

## 2018-11-03 MED ORDER — HEPARIN (PORCINE) IN NACL 1000-0.9 UT/500ML-% IV SOLN
INTRAVENOUS | Status: AC
  Filled 2018-11-03: qty 1000

## 2018-11-03 MED ORDER — FENTANYL CITRATE (PF) 100 MCG/2ML IJ SOLN
INTRAMUSCULAR | Status: AC
  Filled 2018-11-03: qty 2

## 2018-11-03 MED ORDER — FAMOTIDINE 20 MG PO TABS: 40.0000 mg | ORAL_TABLET | ORAL | Status: DC | PRN

## 2018-11-03 MED ORDER — IOPAMIDOL (ISOVUE-300) INJECTION 61%
INTRAVENOUS | Status: DC | PRN
  Administered 2018-11-03: 30 mL via INTRA_ARTERIAL

## 2018-11-03 MED ORDER — MIDAZOLAM HCL 5 MG/5ML IJ SOLN
INTRAMUSCULAR | Status: AC
  Filled 2018-11-03: qty 5

## 2018-11-03 MED ORDER — SODIUM CHLORIDE 0.9% IV SOLUTION
Freq: Once | INTRAVENOUS | Status: AC
  Administered 2018-11-04: 03:00:00 via INTRAVENOUS

## 2018-11-03 MED ORDER — INSULIN ASPART 100 UNIT/ML ~~LOC~~ SOLN
0.0000 [IU] | SUBCUTANEOUS | Status: DC
  Administered 2018-11-03: 4 [IU] via SUBCUTANEOUS
  Administered 2018-11-03: 3 [IU] via SUBCUTANEOUS
  Filled 2018-11-03 (×2): qty 1

## 2018-11-03 MED ORDER — ORAL CARE MOUTH RINSE
15.0000 mL | Freq: Two times a day (BID) | OROMUCOSAL | Status: DC
  Administered 2018-11-03 – 2018-11-08 (×9): 15 mL via OROMUCOSAL

## 2018-11-03 MED ORDER — SODIUM CHLORIDE 0.9% IV SOLUTION: Freq: Once | INTRAVENOUS | Status: AC

## 2018-11-03 MED ORDER — ONDANSETRON HCL 4 MG/2ML IJ SOLN: 4.0000 mg | Freq: Four times a day (QID) | INTRAMUSCULAR | Status: DC | PRN

## 2018-11-03 MED ORDER — HEPARIN SODIUM (PORCINE) 1000 UNIT/ML IJ SOLN
INTRAMUSCULAR | Status: AC
  Filled 2018-11-03: qty 1

## 2018-11-03 MED ORDER — DIPHENHYDRAMINE HCL 50 MG/ML IJ SOLN: 50.0000 mg | Freq: Once | INTRAMUSCULAR | Status: DC

## 2018-11-03 MED ORDER — SODIUM CHLORIDE 0.9 % IV SOLN
INTRAVENOUS | Status: DC | PRN
  Administered 2018-11-03: 500 mL via INTRAVENOUS

## 2018-11-03 MED ORDER — SODIUM CHLORIDE 0.9% IV SOLUTION: Freq: Once | INTRAVENOUS | Status: DC

## 2018-11-03 MED ORDER — MIDAZOLAM HCL 2 MG/2ML IJ SOLN
INTRAMUSCULAR | Status: DC | PRN
  Administered 2018-11-03: 1 mg via INTRAVENOUS
  Administered 2018-11-03: 2 mg via INTRAVENOUS

## 2018-11-03 MED ORDER — IPRATROPIUM-ALBUTEROL 0.5-2.5 (3) MG/3ML IN SOLN
3.0000 mL | Freq: Four times a day (QID) | RESPIRATORY_TRACT | Status: DC | PRN
  Administered 2018-11-03 – 2018-11-07 (×5): 3 mL via RESPIRATORY_TRACT
  Filled 2018-11-03 (×5): qty 3

## 2018-11-03 MED ORDER — PANTOPRAZOLE SODIUM 40 MG IV SOLR
40.0000 mg | Freq: Two times a day (BID) | INTRAVENOUS | Status: DC
  Administered 2018-11-03 – 2018-11-04 (×4): 40 mg via INTRAVENOUS
  Filled 2018-11-03 (×4): qty 40

## 2018-11-03 MED ORDER — FENTANYL CITRATE (PF) 100 MCG/2ML IJ SOLN
INTRAMUSCULAR | Status: DC | PRN
  Administered 2018-11-03 (×2): 50 ug via INTRAVENOUS

## 2018-11-03 MED ORDER — VANCOMYCIN VARIABLE DOSE PER UNSTABLE RENAL FUNCTION (PHARMACIST DOSING): Status: DC

## 2018-11-03 MED ORDER — METHYLPREDNISOLONE SODIUM SUCC 125 MG IJ SOLR: 125.0000 mg | INTRAMUSCULAR | Status: DC | PRN

## 2018-11-03 MED ORDER — LIDOCAINE-EPINEPHRINE (PF) 1 %-1:200000 IJ SOLN
INTRAMUSCULAR | Status: AC
  Filled 2018-11-03: qty 10

## 2018-11-03 MED ORDER — HYDROMORPHONE HCL 1 MG/ML IJ SOLN: 1.0000 mg | Freq: Once | INTRAMUSCULAR | Status: DC | PRN

## 2018-11-03 SURGICAL SUPPLY — 19 items
CATH BEACON 5 .035 65 C2 TIP (CATHETERS) ×2 IMPLANT
CATH MICROCATH PRGRT 2.8F 110 (CATHETERS) IMPLANT
CATH PIG 70CM (CATHETERS) ×2 IMPLANT
COIL 400 COMPLEX SOFT 4X15CM (Vascular Products) ×2 IMPLANT
COIL 400 COMPLEX STD 5X12CM (Vascular Products) ×2 IMPLANT
COIL 400 COMPLEX STD 6X20CM (Vascular Products) ×2 IMPLANT
DEVICE OCCLUSION PODJ15 (Vascular Products) IMPLANT
DEVICE STARCLOSE SE CLOSURE (Vascular Products) ×2 IMPLANT
DEVICE TORQUE .025-.038 (MISCELLANEOUS) ×2 IMPLANT
GLIDEWIRE STIFF .35X180X3 HYDR (WIRE) ×2 IMPLANT
HANDLE DETACHMENT COIL (MISCELLANEOUS) ×2 IMPLANT
KIT CATH CVC 3 LUMEN 7FR 8IN (MISCELLANEOUS) ×2 IMPLANT
MICROCATH PROGREAT 2.8F 110 CM (CATHETERS) ×3
OCCLUSION DEVICE PODJ15 (Vascular Products) ×3 IMPLANT
PACK ANGIOGRAPHY (CUSTOM PROCEDURE TRAY) ×2 IMPLANT
SHEATH BRITE TIP 5FRX11 (SHEATH) ×2 IMPLANT
SYR MEDRAD MARK V 150ML (SYRINGE) ×2 IMPLANT
TUBING CONTRAST HIGH PRESS 72 (TUBING) ×2 IMPLANT
WIRE J 3MM .035X145CM (WIRE) ×2 IMPLANT

## 2018-11-03 NOTE — Progress Notes (Signed)
MEDICATION RELATED CONSULT NOTE - INITIAL   Pharmacy Consult for K-Centra  Indication:   Peri-nephric hematoma ,  Warfarin reversal   Allergies  Allergen Reactions  . Levaquin [Levofloxacin]   . Other Nausea And Vomiting  . Sulfa Antibiotics Hives  . Oxycodone Anxiety and Other (See Comments)    Patient Measurements: Height: 5\' 4"  (162.6 cm) Weight: 240 lb 15.4 oz (109.3 kg) IBW/kg (Calculated) : 54.7 Adjusted Body Weight:   Vital Signs: Temp: 98.5 F (36.9 C) (01/09 1137) Temp Source: Oral (01/09 1137) BP: 114/45 (01/09 1315) Pulse Rate: 90 (01/09 1315) Intake/Output from previous day: 01/08 0701 - 01/09 0700 In: 1448.3 [I.V.:405.3; IV Piggyback:1043.1] Out: -  Intake/Output from this shift: Total I/O In: -  Out: 5 [Urine:5]  Labs: Recent Labs    11/02/18 1856 11/02/18 1905 11/03/18 0039 11/03/18 0410 11/03/18 1358  WBC 35.9*  --   --  41.4*  --   HGB 8.8*  --  7.5* 7.0* 7.6*  HCT 30.6*  --  25.1* 22.6*  --   PLT 447*  --   --  345  --   CREATININE  --  3.03*  --  3.10*  --   ALBUMIN  --  3.3*  --   --   --   PROT  --  7.0  --   --   --   AST  --  212*  --   --   --   ALT  --  83*  --   --   --   ALKPHOS  --  83  --   --   --   BILITOT  --  0.6  --   --   --   BILIDIR  --  0.1  --   --   --   IBILI  --  0.5  --   --   --    Estimated Creatinine Clearance: 18.1 mL/min (A) (by C-G formula based on SCr of 3.1 mg/dL (H)).   Medical History: Past Medical History:  Diagnosis Date  . Atrial fibrillation (HCC)   . Empyema [facility])    in [facility] Aug 2017- stayed in hospital on for 2 months.  . Hypertension       Assessment: Pharmacy consulted to dose and monitor Mikayla [name] in this 78 year old female with peri - nephric hematoma, on warfarin PTA. 1/8:  INR @ 1900 = 3.06   TBW = 145.3 lbs (66kg)  1/8 Kcentra 1644 units IV x 1 dose ordered  1/9 @ 0410 INR: 1.28 1/9 @1358  INR: 1.26   Goal of Therapy:  Reversal of anticoagulation   Plan:  Continue  checking INR Q6H x 4 and then daily x2.   Gardner Candle, PharmD, BCPS Clinical Pharmacist 11/03/2018 2:59 PM

## 2018-11-03 NOTE — Progress Notes (Signed)
MEDICATION RELATED CONSULT NOTE - INITIAL   Pharmacy Consult for [facility]ra  Indication:   Peri-nephric hematoma ,  Warfarin reversal   Allergies  Allergen Reactions  . Levaquin [Levofloxacin]   . Other Nausea And Vomiting  . Sulfa Antibiotics Hives  . Oxycodone Anxiety and Other (See Comments)    Patient Measurements: Height: 5\' 4"  (162.6 cm) Weight: 240 lb 15.4 oz (109.3 kg) IBW/kg (Calculated) : 54.7 Adjusted Body Weight:   Vital Signs: Temp: 98.7 F (37.1 C) (01/09 1600) Temp Source: Oral (01/09 1137) BP: 111/85 (01/09 2000) Pulse Rate: 95 (01/09 2000) Intake/Output from previous day: 01/08 0701 - 01/09 0700 In: 1448.3 [I.V.:405.3; IV Piggyback:1043.1] Out: -  Intake/Output from this shift: Total I/O In: -  Out: 50 [Urine:50]  Labs: Recent Labs    11/02/18 1856 11/02/18 1905 11/03/18 0039 11/03/18 0410 11/03/18 1358 11/03/18 1500 11/03/18 1948  WBC 35.9*  --   --  41.4*  --   --   --   HGB 8.8*  --  7.5* 7.0* 7.6*  --  6.7*  HCT 30.6*  --  25.1* 22.6*  --   --  21.3*  PLT 447*  --   --  345  --   --   --   CREATININE  --  3.03*  --  3.10*  --  3.12*  --   ALBUMIN  --  3.3*  --   --   --   --   --   PROT  --  7.0  --   --   --   --   --   AST  --  212*  --   --   --   --   --   ALT  --  83*  --   --   --   --   --   ALKPHOS  --  83  --   --   --   --   --   BILITOT  --  0.6  --   --   --   --   --   BILIDIR  --  0.1  --   --   --   --   --   IBILI  --  0.5  --   --   --   --   --    Estimated Creatinine Clearance: 17.9 mL/min (A) (by C-G formula based on SCr of 3.12 mg/dL (H)).   Medical History: Past Medical History:  Diagnosis Date  . Atrial fibrillation (HCC)   . Empyema [facility])    in [facility] Aug 2017- stayed in hospital on for 2 months.  . Hypertension       Assessment: Pharmacy consulted to dose and monitor Mikayla [name] in this 78 year old female with peri - nephric hematoma, on warfarin PTA. 1/8:  INR @ 1900 = 3.06   TBW = 145.3 lbs  (66kg)  1/8 Kcentra 1644 units IV x 1 dose ordered  1/9 @ 0410 INR: 1.28 1/9 @1358  INR: 1.26 1/9 @ 2140 INR : 139   Goal of Therapy:  Reversal of anticoagulation   Plan:  Continue checking INR Q6H x 4 and then daily x2.   Chesley Valls D, PharmD, 11/03/2018 10:46 PM

## 2018-11-03 NOTE — Progress Notes (Signed)
Pharmacy Antibiotic Note  Mikayla [name] is a 78 y.o. female admitted on 11/02/2018 with sepsis.  Pharmacy has been consulted for vancomycin dosing.  Plan: Vancomycin 1500 mg load dose ordered. Will dose by serial levels as renal function is significantly reduced from baseline 07/2018.  Marland Kitchen  Height: 5\' 4"  (162.6 cm) Weight: 240 lb 15.4 oz (109.3 kg) IBW/kg (Calculated) : 54.7  Temp (24hrs), Avg:98.7 F (37.1 C), Min:97.7 F (36.5 C), Max:100.8 F (38.2 C)  Recent Labs  Lab 11/02/18 1856 11/02/18 1905 11/02/18 1909 11/02/18 2235 11/03/18 0235  WBC 35.9*  --   --   --   --   CREATININE  --  3.03*  --   --   --   LATICACIDVEN  --   --  8.39* 4.8* 3.0*    Estimated Creatinine Clearance: 18.5 mL/min (A) (by C-G formula based on SCr of 3.03 mg/dL (H)).    Allergies  Allergen Reactions  . Levaquin [Levofloxacin]   . Other Nausea And Vomiting  . Sulfa Antibiotics Hives  . Oxycodone Anxiety and Other (See Comments)    Antimicrobials this admission: Cefepime x1, meropenem, vancomycin, metronidazole 1/9  >>    >>   Dose adjustments this admission:   Microbiology results: 1/8 BCx: pending 1/8 MRSA PCR: (-) 1/8 UCx: pending     1/8 UA: pending  Thank you for allowing pharmacy to be a part of this patient's care.  Amauris Debois S 11/03/2018 3:52 AM

## 2018-11-03 NOTE — Progress Notes (Signed)
Transfusion consent completed with daughter over the phone per protocol and place in the chart.

## 2018-11-03 NOTE — Progress Notes (Signed)
[facility]   PATIENT NAME: Mikayla [name]    MR#:  [id number]  DATE OF BIRTH:  10/19/1941  SUBJECTIVE:  CHIEF COMPLAINT:   Chief Complaint  Patient presents with  . Emesis  . Abdominal Pain   No new complaint this morning.  Patient resting comfortably with multiple family members at bedside.  Updated on treatment plans.  REVIEW OF SYSTEMS:  Review of Systems  Constitutional: Negative for chills and fever.  HENT: Negative for hearing loss and tinnitus.   Eyes: Negative for blurred vision and double vision.  Respiratory: Negative for cough and hemoptysis.   Cardiovascular: Negative for chest pain and palpitations.  Gastrointestinal: Negative for abdominal pain, heartburn, nausea and vomiting.  Genitourinary: Negative for dysuria and urgency.  Musculoskeletal: Negative for myalgias and neck pain.  Skin: Negative for itching and rash.  Neurological: Negative for dizziness and headaches.  Psychiatric/Behavioral: Negative for depression and hallucinations.    DRUG ALLERGIES:   Allergies  Allergen Reactions  . Levaquin [Levofloxacin]   . Other Nausea And Vomiting  . Sulfa Antibiotics Hives  . Oxycodone Anxiety and Other (See Comments)   VITALS:  Blood pressure (!) 132/57, pulse 88, temperature 98.5 F (36.9 C), temperature source Oral, resp. rate (!) 21, height 5\' 4"  (1.626 m), weight 109.3 kg, SpO2 100 %. PHYSICAL EXAMINATION:   Physical Exam  Constitutional: Mikayla [name] is oriented to person, place, and time and well-developed, well-nourished, and in no distress.  Morbidly obese  HENT:  Head: Normocephalic and atraumatic.  Eyes: Pupils are equal, round, and reactive to light. Conjunctivae and EOM are normal.  Neck: Normal range of motion. No tracheal deviation present. No thyromegaly present.  Cardiovascular: Normal rate, regular rhythm and normal heart sounds.  Pulmonary/Chest: Effort normal and breath sounds normal. Mikayla [name] has no  wheezes.  Musculoskeletal:        General: No tenderness or edema.  Neurological: Mikayla [name] is alert and oriented to person, place, and time.  Skin: Skin is warm. Mikayla [name] is not diaphoretic.  Psychiatric: Affect and judgment normal.   LABORATORY PANEL:  Female CBC Recent Labs  Lab 11/03/18 0410 11/03/18 1358  WBC 41.4*  --   HGB 7.0* 7.6*  HCT 22.6*  --   PLT 345  --    ------------------------------------------------------------------------------------------------------------------ Chemistries  Recent Labs  Lab 11/02/18 1905  11/03/18 1500  NA 135   < > 137  K 4.8   < > 5.0  CL 93*   < > 100  CO2 23   < > 27  GLUCOSE 241*   < > 132*  BUN 30*   < > 36*  CREATININE 3.03*   < > 3.12*  CALCIUM 8.6*   < > 7.8*  AST 212*  --   --   ALT 83*  --   --   ALKPHOS 83  --   --   BILITOT 0.6  --   --    < > = values in this interval not displayed.   RADIOLOGY:  Ct Abdomen Pelvis Wo Contrast  Result Date: 11/02/2018 CLINICAL DATA:  Acute abdominal pain.  Fever.  On Coumadin. EXAM: CT ABDOMEN AND PELVIS WITHOUT CONTRAST TECHNIQUE: Multidetector CT imaging of the abdomen and pelvis was performed following the standard protocol without IV contrast. COMPARISON:  CT abdomen pelvis 08/21/2018 FINDINGS: Lower chest: Calcified granuloma left lower lobe. Multifocal nodular densities right lung base posteriorly have progressed. Probable mucous plugging. No effusion. Hepatobiliary: Small calcified gallstones.  No biliary dilatation. No focal liver lesion. Pancreas: Pancreatic atrophy.  No mass or edema Spleen: Negative Adrenals/Urinary Tract: Hyperdense right renal hemorrhage has developed since the prior study. This involves the upper mid and lower pole extends into the perirenal space laterally. Hematoma is also present in the retroperitoneum posteriorly which appears to connect with the upper pole of the right renal hemorrhage. Posterior pararenal hematoma measures approximately 9 x 11 x 16 cm and also is  hyperdense. No underlying renal lesion is seen on the recent CT. Patient is on Coumadin with INR of 3. Left kidney is atrophic without focal abnormality. Bladder is empty. Stomach/Bowel: Negative for bowel obstruction. No bowel mass or edema. Vascular/Lymphatic: Atherosclerotic aorta without aneurysm. No lymphadenopathy. Reproductive: Normal uterus.  No pelvic mass Other: Small amount of free fluid in the pelvis. 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 Musculoskeletal: Diffuse lumbar degenerative changes. No acute skeletal abnormality. IMPRESSION: Acute hemorrhage in the right kidney. Hemorrhage has ruptured into the posterior pararenal space where there is a large acute hematoma. No underlying renal mass is seen on recent CT of 08/21/2018. Hemorrhage is likely due to coagulopathy. These results were called by telephone at the time of interpretation on 11/02/2018 at 8:45 pm to Dr. Dionne Bucy , who verbally acknowledged these results. Electronically Signed   By: Marlan Palau M.D.   On: 11/02/2018 20:45   Dg Chest Portable 1 View  Result Date: 11/02/2018 CLINICAL DATA:  Fever and hypotension EXAM: PORTABLE CHEST 1 VIEW COMPARISON:  11/02/2018 CT AP FINDINGS: Lung volumes are low. The patient is slightly rotated on this exam. Left atrial enlargement is identified. Aortic atherosclerosis without aneurysm. Mild vascular congestion without acute pulmonary consolidation. Nodule at the left lung base consistent with a granuloma based on same day CT. Atelectasis is noted at the right lung base. Remote posttraumatic deformity of the right fifth rib. IMPRESSION: 1. Low lung volumes with atelectasis at the right lung base. Mild vascular congestion. 2. Dense nodule at the left lung base consistent with a granuloma as seen on same day CT. Electronically Signed   By: Tollie Eth M.D.   On: 11/02/2018 22:09   US Abdomen Limited Ruq  Result Date: 11/02/2018 CLINICAL DATA:  78 y/o  F; evaluation of cholecystitis.  EXAM: ULTRASOUND ABDOMEN LIMITED RIGHT UPPER QUADRANT COMPARISON:  11/02/2018 CT abdomen and pelvis. FINDINGS: Gallbladder: Cholelithiasis. Collapsed gallbladder. Gallbladder wall measures 2.5 mm in thickness. No pericholecystic fluid. Common bile duct: Diameter: 2.5 mm Liver: Increased liver echogenicity. No focal liver lesion identified. Trace perihepatic fluid. Portal vein is patent on color Doppler imaging with normal direction of blood flow towards the liver. Other: Partially visualized collection within the right renal fossa. IMPRESSION: 1. Cholelithiasis.  No secondary signs of acute cholecystitis. 2. Echogenic liver compatible with steatosis. 3. Partially visualized right retroperitoneal collection corresponding to hematoma on CT. Electronically Signed   By: Mitzi Hansen M.D.   On: 11/02/2018 21:07   ASSESSMENT AND PLAN:     1.  Severe sepsis (HCC) -suspected source is urinary. Nursing staff still not obtained urine sample for urinalysis. Lactic acid level was significantly elevated but trended down.  Continue IV fluid hydration. Empiric antibiotics with meropenem Pain managed by critical care team.  2.  Right-sided perinephric hematoma Secondary to Coumadin therapy which was reversed with with vitamin K reversal protocol INR down to 1.28 this morning. Patient seen by urologist this morning.  Patient not a surgical candidate for nephrectomy..  Recommendation was for vascular  surgery consult.  Seen by vascular surgery and plan was for endovascular embolization of the right kidney today.  3.  Acute kidney injury Likely due to hemorrhage and sepsis. Continue IV fluid hydration.  Nephrology service following patient.  Follow-up on renal function in a.m.  4. HTN (hypertension) -hold all antihypertensives due to low blood pressure on presentation. Gradually resume as blood pressure begins to trend up  5.  PAF (paroxysmal atrial fibrillation) (HCC) - Coumadin placed on hold due to  renal hemorrhage.   Rate controlled  DVT prophylaxis; SCDs for now No Coumadin or heparin products due to retinal bleed.  All the records are reviewed and case discussed with Care Management/Social Worker. Management plans discussed with the patient, family and they are in agreement.  CODE STATUS: DNR  TOTAL TIME TAKING CARE OF THIS PATIENT: 40 minutes.   More than 50% of the time was spent in counseling/coordination of care: YES  POSSIBLE D/C IN 3 DAYS, DEPENDING ON CLINICAL CONDITION.   Ismahan Lippman M.D on 11/03/2018 at 3:47 PM  Between 7am to 6pm - Pager - [phone]  After 6pm go to [email or web address] - Social research officer, governmentpassword EPAS [email or web address]  [facility] [place] [facility]  Office  [phone]  CC: Primary care physician; System, Pcp Not In  Note: This dictation was prepared with Dragon dictation along with smaller phrase technology. Any transcriptional errors that result from this process are unintentional.

## 2018-11-03 NOTE — Consult Note (Signed)
[facility] Vascular Consult Note  MRN : [id number]  Mikayla [name] is a 78 y.o. (September 08, 1941) female who presents with chief complaint of  Chief Complaint  Patient presents with  . Emesis  . Abdominal Pain   History of Present Illness:  The patient is a 78 year old female with a past medical history of hypertension, atrial fibrillation on chronic Coumadin, morbid obesity who presented to the [facility] from [facility] complaining of fevers, abdominal pain and vomiting.  The patient is a poor historian.  Patient seen and examined in the ICU this morning.  Patient is not complaining of any pain, nausea vomiting this AM but is very confused.  Family is at bedside.  Upon presentation to the emergency department the patient was found to be septic with an elevated lactate of 8, with acute kidney injury with a creatinine of 3, hypotension, leukocytosis and febrile.  Patient was diagnosed with sepsis secondary to cholangitis in October 2019 which was managed conservatively with antibiotics and supportive care.  She was not considered a good surgical candidate at that time.  A noncontrast CT of the abdomen and pelvis was notable for acute hemorrhage in the right kidney. Hemorrhage has ruptured into the posterior pararenal space where there is a large acute hematoma.  Vascular surgery was consulted by Dr. Apolinar Junes for possible endovascular embolization of the right kidney.  Current Facility-Administered Medications  Medication Dose Route Frequency Provider Last Rate Last Dose  . 0.9 %  sodium chloride infusion (Manually program via Guardrails IV Fluids)   Intravenous Once Harlon Ditty D, NP      . 0.9 %  sodium chloride infusion   Intravenous Continuous Oralia Manis, MD 100 mL/hr at 11/03/18 0700    . acetaminophen (TYLENOL) tablet 650 mg  650 mg Oral Q6H PRN Oralia Manis, MD       Or  . acetaminophen (TYLENOL) suppository 650 mg  650 mg  Rectal Q6H PRN Oralia Manis, MD      . carbidopa-levodopa (SINEMET IR) 10-100 MG per tablet immediate release 1 tablet  1 tablet Oral TID Oralia Manis, MD   1 tablet at 11/03/18 [phone]  . insulin aspart (novoLOG) injection 0-20 Units  0-20 Units Subcutaneous Q4H Harlon Ditty D, NP   3 Units at 11/03/18 [phone]  . MEDLINE mouth rinse  15 mL Mouth Rinse BID Oralia Manis, MD      . meropenem [facility]) 500 mg in sodium chloride 0.9 % 100 mL IVPB  500 mg Intravenous Q12H Harlon Ditty D, NP 200 mL/hr at 11/03/18 0906 500 mg at 11/03/18 0906  . metroNIDAZOLE (FLAGYL) IVPB 500 mg  500 mg Intravenous Willow Ora, MD   Stopped at 11/03/18 0424  . norepinephrine (LEVOPHED) 4 mg in dextrose 5 % 250 mL (0.016 mg/mL) infusion  0-40 mcg/min Intravenous Titrated Harlon Ditty D, NP 18.75 mL/hr at 11/03/18 0700 5 mcg/min at 11/03/18 0700  . ondansetron (ZOFRAN) tablet 4 mg  4 mg Oral Q6H PRN Oralia Manis, MD       Or  . ondansetron [facility]) injection 4 mg  4 mg Intravenous Q6H PRN Oralia Manis, MD      . pantoprazole (PROTONIX) injection 40 mg  40 mg Intravenous Q12H Harlon Ditty D, NP   40 mg at 11/03/18 0906  . vancomycin variable dose per unstable renal function (pharmacist dosing)   Does not apply See admin instructions Gardner Candle, [facility]  Past Medical History:  Diagnosis Date  . Atrial fibrillation (HCC)   . Empyema [facility](HCC)    in [facility] Aug 2017- stayed in hospital on for 2 months.  . Hypertension    Past Surgical History:  Procedure Laterality Date  . ERCP N/A 08/23/2018   Procedure: ENDOSCOPIC RETROGRADE CHOLANGIOPANCREATOGRAPHY (ERCP);  Surgeon: Midge MiniumWohl, Darren, MD;  [facility];  Service: Endoscopy;  Laterality: N/A;  . PEG TUBE PLACEMENT     Social History Social History   Tobacco Use  . Smoking status: Former Smoker    Types: Cigarettes  . Smokeless tobacco: Never Used  . Tobacco comment: quit in 2001. smoked for 30 years  Substance Use Topics  . Alcohol  use: No  . Drug use: No   Family History Family History  Problem Relation Age of Onset  . Hypertension Mother   . COPD Mother   . Lung cancer Father   . Liver cancer Sister   Denies family history of peripheral artery disease, venous disease or bleeding/clotting disorders.  Allergies  Allergen Reactions  . Levaquin [Levofloxacin]   . Other Nausea And Vomiting  . Sulfa Antibiotics Hives  . Oxycodone Anxiety and Other (See Comments)   REVIEW OF SYSTEMS (Negative unless checked)  Constitutional: [] Weight loss  [x] Fever  [] Chills Cardiac: [] Chest pain   [] Chest pressure   [] Palpitations   [] Shortness of breath when laying flat   [] Shortness of breath at rest   [] Shortness of breath with exertion. Vascular:  [] Pain in legs with walking   [] Pain in legs at rest   [] Pain in legs when laying flat   [] Claudication   [] Pain in feet when walking  [] Pain in feet at rest  [] Pain in feet when laying flat   [] History of DVT   [] Phlebitis   [x] Swelling in legs   [] Varicose veins   [] Non-healing ulcers Pulmonary:   [] Uses home oxygen   [] Productive cough   [] Hemoptysis   [] Wheeze  [] COPD   [] Asthma Neurologic:  [] Dizziness  [] Blackouts   [] Seizures   [] History of stroke   [] History of TIA  [] Aphasia   [] Temporary blindness   [] Dysphagia   [] Weakness or numbness in arms   [] Weakness or numbness in legs Musculoskeletal:  [] Arthritis   [] Joint swelling   [] Joint pain   [] Low back pain Hematologic:  [] Easy bruising  [] Easy bleeding   [] Hypercoagulable state   [x] Anemic  [] Hepatitis Gastrointestinal:  [] Blood in stool   [] Vomiting blood  [] Gastroesophageal reflux/heartburn   [] Difficulty swallowing. Genitourinary:  [] Chronic kidney disease   [] Difficult urination  [] Frequent urination  [] Burning with urination   [] Blood in urine Skin:  [] Rashes   [] Ulcers   [] Wounds Psychological:  [] History of anxiety   []  History of major depression.  Physical Examination  Vitals:   11/03/18 0700 11/03/18 0823  11/03/18 0843 11/03/18 0900  BP: (!) 129/99 (!) 102/51  114/65  Pulse: 96 (!) 102  97  Resp: (!) 37 15  15  Temp:  99.2 F (37.3 C) 98.9 F (37.2 C)   TempSrc:  Oral Oral   SpO2: 98% 97%  98%  Weight:      Height:       Body mass index is 41.36 kg/m. Gen:  WD/WN, NAD Head: /AT, No temporalis wasting. Prominent temp pulse not noted. Ear/Nose/Throat: Hearing grossly intact, nares w/o erythema or drainage, oropharynx w/o Erythema/Exudate Eyes: Sclera non-icteric, conjunctiva clear Neck: Trachea midline.  No JVD.  Pulmonary:  Good air movement, respirations not labored,  equal bilaterally.  Cardiac: Irregularly irregular Vascular:  Vessel Right Left  Radial Palpable Palpable  Ulnar Palpable Palpable  Brachial Palpable Palpable  Carotid Palpable, without bruit Palpable, without bruit  Aorta Not palpable N/A  Femoral Palpable Palpable  Popliteal Palpable Palpable  PT  hard to palpate due to body habitus however the foot is warm with good capillary refill hard to palpate due to body habitus however the foot is warm with good capillary refill  DP hard to palpate due to body habitus however the foot is warm with good capillary refill hard to palpate due to body habitus however the foot is warm with good capillary refill   Gastrointestinal: soft, non-tender/non-distended. No guarding/reflex.  Musculoskeletal: M/S 5/5 throughout.  Extremities without ischemic changes.  No deformity or atrophy.  Mild to moderate lower extremity edema bilaterally Neurologic: Sensation grossly intact in extremities.  Symmetrical.  Speech is fluent. Motor exam as listed above. Psychiatric: Judgment intact, Mood & affect appropriate for pt's clinical situation. Dermatologic: No rashes or ulcers noted.  No cellulitis or open wounds. Lymph : No Cervical, Axillary, or Inguinal lymphadenopathy.  CBC Lab Results  Component Value Date   WBC 41.4 (H) 11/03/2018   HGB 7.0 (L) 11/03/2018   HCT 22.6 (L)  11/03/2018   MCV 91.5 11/03/2018   PLT 345 11/03/2018   BMET    Component Value Date/Time   NA 136 11/03/2018 0410   K 5.0 11/03/2018 0410   CL 100 11/03/2018 0410   CO2 25 11/03/2018 0410   GLUCOSE 162 (H) 11/03/2018 0410   BUN 35 (H) 11/03/2018 0410   CREATININE 3.10 (H) 11/03/2018 0410   CALCIUM 7.7 (L) 11/03/2018 0410   GFRNONAA 14 (L) 11/03/2018 0410   GFRAA 16 (L) 11/03/2018 0410   Estimated Creatinine Clearance: 18.1 mL/min (A) (by C-G formula based on SCr of 3.1 mg/dL (H)).  COAG Lab Results  Component Value Date   INR 1.28 11/03/2018   INR 3.06 11/02/2018   INR 1.16 08/25/2018   Radiology Ct Abdomen Pelvis Wo Contrast  Result Date: 11/02/2018 CLINICAL DATA:  Acute abdominal pain.  Fever.  On Coumadin. EXAM: CT ABDOMEN AND PELVIS WITHOUT CONTRAST TECHNIQUE: Multidetector CT imaging of the abdomen and pelvis was performed following the standard protocol without IV contrast. COMPARISON:  CT abdomen pelvis 08/21/2018 FINDINGS: Lower chest: Calcified granuloma left lower lobe. Multifocal nodular densities right lung base posteriorly have progressed. Probable mucous plugging. No effusion. Hepatobiliary: Small calcified gallstones. No biliary dilatation. No focal liver lesion. Pancreas: Pancreatic atrophy.  No mass or edema Spleen: Negative Adrenals/Urinary Tract: Hyperdense right renal hemorrhage has developed since the prior study. This involves the upper mid and lower pole extends into the perirenal space laterally. Hematoma is also present in the retroperitoneum posteriorly which appears to connect with the upper pole of the right renal hemorrhage. Posterior pararenal hematoma measures approximately 9 x 11 x 16 cm and also is hyperdense. No underlying renal lesion is seen on the recent CT. Patient is on Coumadin with INR of 3. Left kidney is atrophic without focal abnormality. Bladder is empty. Stomach/Bowel: Negative for bowel obstruction. No bowel mass or edema.  Vascular/Lymphatic: Atherosclerotic aorta without aneurysm. No lymphadenopathy. Reproductive: Normal uterus.  No pelvic mass Other: Small amount of free fluid in the pelvis. 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 Musculoskeletal: Diffuse lumbar degenerative changes. No acute skeletal abnormality. IMPRESSION: Acute hemorrhage in the right kidney. Hemorrhage has ruptured into the posterior pararenal space where there is a  large acute hematoma. No underlying renal mass is seen on recent CT of 08/21/2018. Hemorrhage is likely due to coagulopathy. These results were called by telephone at the time of interpretation on 11/02/2018 at 8:45 pm to Dr. Dionne Bucy , who verbally acknowledged these results. Electronically Signed   By: Marlan Palau M.D.   On: 11/02/2018 20:45   Dg Chest Portable 1 View  Result Date: 11/02/2018 CLINICAL DATA:  Fever and hypotension EXAM: PORTABLE CHEST 1 VIEW COMPARISON:  11/02/2018 CT AP FINDINGS: Lung volumes are low. The patient is slightly rotated on this exam. Left atrial enlargement is identified. Aortic atherosclerosis without aneurysm. Mild vascular congestion without acute pulmonary consolidation. Nodule at the left lung base consistent with a granuloma based on same day CT. Atelectasis is noted at the right lung base. Remote posttraumatic deformity of the right fifth rib. IMPRESSION: 1. Low lung volumes with atelectasis at the right lung base. Mild vascular congestion. 2. Dense nodule at the left lung base consistent with a granuloma as seen on same day CT. Electronically Signed   By: Tollie Eth M.D.   On: 11/02/2018 22:09   US Abdomen Limited Ruq  Result Date: 11/02/2018 CLINICAL DATA:  78 y/o  F; evaluation of cholecystitis. EXAM: ULTRASOUND ABDOMEN LIMITED RIGHT UPPER QUADRANT COMPARISON:  11/02/2018 CT abdomen and pelvis. FINDINGS: Gallbladder: Cholelithiasis. Collapsed gallbladder. Gallbladder wall measures 2.5 mm in thickness. No pericholecystic fluid. Common  bile duct: Diameter: 2.5 mm Liver: Increased liver echogenicity. No focal liver lesion identified. Trace perihepatic fluid. Portal vein is patent on color Doppler imaging with normal direction of blood flow towards the liver. Other: Partially visualized collection within the right renal fossa. IMPRESSION: 1. Cholelithiasis.  No secondary signs of acute cholecystitis. 2. Echogenic liver compatible with steatosis. 3. Partially visualized right retroperitoneal collection corresponding to hematoma on CT. Electronically Signed   By: Mitzi Hansen M.D.   On: 11/02/2018 21:07   Assessment/Plan The patient is a 78 year old female with multiple comorbidities presented to the [facility] with a sepsis and was found to have an acute hemorrhage in the right kidney. 1.  Right kidney hemorrhage: Seen by urology who feels the patient is not a good surgical candidate for an emergent nephrectomy.  Overall, the patient continues to decline requiring blood transfusions, and a brief period of time on pressors for hypotension.  Due to the patient being a poor surgical candidate for an emergent nephrectomy in the setting of continued bleeding recommend endovascular embolization of the right kidney.  The patient and her family who were at her bedside during our meeting understand she will most likely lose function of the right kidney.  They also understand that contrast is needed to complete this procedure and there is a possibility of her kidney function worsening.  Nephrology was also present during this meeting and they are aware she may need temporary or permanent dialysis in the future.  Procedure, risks, benefits explained to the patient and her family who were present.  All questions were answered.  The family and the patient wished to proceed. 2. Acute Kidney Injury: Nephrology is following.  Has not made any urine since admission.  Most recent bladder scan 50cc.  Kidney function may worsen  after procedure however the patient and the family are aware and understand the emergent need for the procedure.  3. Sepsis: Patient is currently on broad-spectrum antibiotics.  Supportive care per the ICU and medical team.  Discussed with Dr. Wallis Mart A  [facility], PA-C  11/03/2018 9:44 AM  This note was created with Dragon medical transcription system.  Any error is purely unintentional

## 2018-11-03 NOTE — Progress Notes (Addendum)
Palliative Note:  Referral received for goals of care discussion. Patient off unit for vascular procedure at this time. Will follow up with patient and family tomorrow. Dr. Jayme Cloud aware.   Thank you for your referral.   Willette Alma, AGPCNP-BC Palliative Medicine Team  Phone: [phone] Fax: [phone] Pager: [phone] Amion: N. Cousar   NO CHARGE

## 2018-11-03 NOTE — Consult Note (Signed)
Urology Consult  I have been asked to see the patient by Dr. Marisa SeverinSiadecki, for evaluation and management of right perinephric hematoma.  Chief Complaint:   History of Present Illness: Mikayla JohnsCatherine R [name] is a 78 y.o. year old with multiple medical comorbidities presenting concern from assisted [facility], with fevers, abdominal pain, vomiting.  In the emergency room, she was found to have a septic-like picture with a significantly elevated lactate of 8, acute kidney injury with creatinine of 3, hypotension, leukocytosis, low-grade temp.  Bolus in the emergency room, her blood pressure was able to be stabilized and she was admitted to the ICU on the medical service.  Present for further evaluation, she underwent a noncontrast CT scan which shows a large by 11 x 16 cm pararenal hematoma posteriorly which appears to be arising from the right upper posterior pole of the kidney.  On previous scans, she has no significant underlying renal masses although there is a cystic structure, approximately 1 cm on the right.  Notably, she is on chronic Coumadin for A. fib, INR 3 upon arrival to the emergency room which is since been reversed with vitamin K.  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  Source of infection is still unclear.  Right upper quadrant ultrasound negative.  UA has not yet been obtained.  Been an uric since admission.  Bladder scan is minimal.  Overnight, it appears now that she has a small pressor requirement, on levophed started just after midnight.  Her hemoglobin has continued to drop down to 7, previously 12 prior to this admission.  Patient was unable to provide much additional history this morning at bedside.  Past Medical History:  Diagnosis Date  . Atrial fibrillation (HCC)   . Empyema [facility](HCC)    in [facility]  Aug 2017- stayed in hospital on for 2 months.  . Hypertension     Past Surgical History:  Procedure Laterality Date  . ERCP N/A 08/23/2018   Procedure: ENDOSCOPIC RETROGRADE CHOLANGIOPANCREATOGRAPHY (ERCP);  Surgeon: Midge MiniumWohl, Darren, MD;  [facility] ENDOSCOPY;  Service: Endoscopy;  Laterality: N/A;  . PEG TUBE PLACEMENT      Home Medications:  Current Meds  Medication Sig  . acetaminophen (TYLENOL) 325 MG tablet Take 650 mg by mouth every 4 (four) hours as needed for mild pain or fever.  Marland Kitchen. amiodarone (PACERONE) 200 MG tablet Take 1 tablet (200 mg total) by mouth daily.  . carbidopa-levodopa (SINEMET IR) 10-100 MG tablet Take 1 tablet by mouth 3 (three) times daily.  . ferrous sulfate 324 (65 Fe) MG TBEC Take 1 tablet by mouth daily.  . furosemide (LASIX) 20 MG tablet Take 1 tablet (20 mg total) by mouth daily.  Marland Kitchen. gabapentin (NEURONTIN) 300 MG capsule Take 300 mg by mouth at bedtime.  Marland Kitchen. guaiFENesin (ROBITUSSIN) 100 MG/5ML SOLN Take 10 mLs by mouth every 6 (six) hours as needed for cough or to loosen phlegm. FOR 10 DAYS  . ipratropium-albuterol (DUONEB) 0.5-2.5 (3) MG/3ML SOLN Take 3 mLs by nebulization 3 (three) times daily.  . Melatonin 5 MG TABS Take 5 mg by mouth at bedtime.  . Multiple Vitamins-Minerals (CENTROVITE) TABS Take 1 tablet by mouth daily.  Marland Kitchen. nystatin (NYSTATIN) powder Apply topically 2 (two) times daily as needed. APPLY UNDER NECK TWICE DAILY AS NEEDED FOR YEAST  . omeprazole (PRILOSEC OTC) 20 MG tablet  Take 20 mg by mouth 2 (two) times daily.  . ondansetron (ZOFRAN-ODT) 4 MG disintegrating tablet Take 4 mg by mouth every 6 (six) hours as needed for nausea or vomiting. FOR 3 DAYS  . oxymetazoline (AFRIN) 0.05 % nasal spray Place 2 sprays into both nostrils 2 (two) times daily. For 3 days  . PARoxetine (PAXIL) 30 MG tablet Take 30 mg by mouth daily.   . potassium chloride SA (K-DUR,KLOR-CON) 20 MEQ tablet Take 20 mEq by mouth daily.  Marland Kitchen senna (SENOKOT) 8.6 MG TABS tablet  Take 1 tablet by mouth daily.  . traZODone (DESYREL) 50 MG tablet Take 25 mg by mouth at bedtime.  Marland Kitchen warfarin (COUMADIN) 5 MG tablet Take 5 mg by mouth daily at 8 pm.    Allergies:  Allergies  Allergen Reactions  . Levaquin [Levofloxacin]   . Other Nausea And Vomiting  . Sulfa Antibiotics Hives  . Oxycodone Anxiety and Other (See Comments)    Family History  Problem Relation Age of Onset  . Hypertension Mother   . COPD Mother   . Lung cancer Father   . Liver cancer Sister     Social History:  reports that she has quit smoking. Her smoking use included cigarettes. She has never used smokeless tobacco. She reports that she does not drink alcohol or use drugs.  ROS: A complete review of systems was performed.  All systems are negative except for pertinent findings as noted.  Doubly, the patient does appear to be somewhat of a poor historian.  Physical Exam:  Vital signs in last 24 hours: Temp:  [97.7 F (36.5 C)-100.8 F (38.2 C)] 98.3 F (36.8 C) (01/09 0400) Pulse Rate:  [92-108] 93 (01/09 0600) Resp:  [16-29] 16 (01/09 0600) BP: (74-126)/(28-93) 107/62 (01/09 0600) SpO2:  [91 %-100 %] 100 % (01/09 0600) Weight:  [109.3 kg] 109.3 kg (01/08 2351) Constitutional:  Alert and oriented but appears to be mildly confused and somewhat poor historian HEENT:  AT, moist mucus membranes.  Trachea midline, no masses Respiratory: Wearing O2, no respiratory distress or increased work of breathing. GI: Abdomen is soft, nontender, nondistended, no abdominal masses, morbidly obese, umbilical hernia GU: No CVA tenderness or flank hematoma appreciated although unable to adequately rule the patient Skin: No rashes, bruises or suspicious lesions Neurologic: Grossly intact, no focal deficits, moving all 4 extremities Psychiatric: Normal mood and affect   Laboratory Data:  Recent Labs    11/02/18 1856 11/03/18 0039 11/03/18 0410  WBC 35.9*  --  41.4*  HGB 8.8* 7.5* 7.0*  HCT 30.6*  25.1* 22.6*   Recent Labs    11/02/18 1905 11/03/18 0410  NA 135 136  K 4.8 5.0  CL 93* 100  CO2 23 25  GLUCOSE 241* 162*  BUN 30* 35*  CREATININE 3.03* 3.10*  CALCIUM 8.6* 7.7*   Recent Labs    11/02/18 1856 11/03/18 0410  INR 3.06 1.28   No results for input(s): LABURIN in the last 72 hours. Results for orders placed or performed during the hospital encounter of 11/02/18  Culture, blood (Routine x 2)     Status: None (Preliminary result)   Collection Time: 11/02/18  7:06 PM  Result Value Ref Range Status   Specimen Description BLOOD LEFT ANTECUBITAL  Final   Special Requests   Final    BOTTLES DRAWN AEROBIC AND ANAEROBIC Blood Culture adequate volume   Culture   Final    NO GROWTH < 12 HOURS Performed at [facility]  [facility], [address] [place], [place] [address]    Report Status PENDING  Incomplete  MRSA PCR Screening     Status: None   Collection Time: 11/02/18 11:43 PM  Result Value Ref Range Status   MRSA by PCR NEGATIVE NEGATIVE Final    Comment:        The GeneXpert MRSA Assay (FDA approved for NASAL specimens only), is one component of a comprehensive MRSA colonization surveillance program. It is not intended to diagnose MRSA infection nor to guide or monitor treatment for MRSA infections. Performed at [facility], [address] [place], [place] [address]   Culture, blood (Routine x 2)     Status: None (Preliminary result)   Collection Time: 11/03/18 12:38 AM  Result Value Ref Range Status   Specimen Description BLOOD RIGHT ANTECUBITAL  Final   Special Requests   Final    BOTTLES DRAWN AEROBIC AND ANAEROBIC Blood Culture results may not be optimal due to an excessive volume of blood received in culture bottles   Culture   Final    NO GROWTH < 12 HOURS Performed at [facility], [address] [place], [place] [address]    Report Status PENDING  Incomplete     Radiologic Imaging: Ct Abdomen Pelvis Wo  Contrast  Result Date: 11/02/2018 CLINICAL DATA:  Acute abdominal pain.  Fever.  On Coumadin. EXAM: CT ABDOMEN AND PELVIS WITHOUT CONTRAST TECHNIQUE: Multidetector CT imaging of the abdomen and pelvis was performed following the standard protocol without IV contrast. COMPARISON:  CT abdomen pelvis 08/21/2018 FINDINGS: Lower chest: Calcified granuloma left lower lobe. Multifocal nodular densities right lung base posteriorly have progressed. Probable mucous plugging. No effusion. Hepatobiliary: Small calcified gallstones. No biliary dilatation. No focal liver lesion. Pancreas: Pancreatic atrophy.  No mass or edema Spleen: Negative Adrenals/Urinary Tract: Hyperdense right renal hemorrhage has developed since the prior study. This involves the upper mid and lower pole extends into the perirenal space laterally. Hematoma is also present in the retroperitoneum posteriorly which appears to connect with the upper pole of the right renal hemorrhage. Posterior pararenal hematoma measures approximately 9 x 11 x 16 cm and also is hyperdense. No underlying renal lesion is seen on the recent CT. Patient is on Coumadin with INR of 3. Left kidney is atrophic without focal abnormality. Bladder is empty. Stomach/Bowel: Negative for bowel obstruction. No bowel mass or edema. Vascular/Lymphatic: Atherosclerotic aorta without aneurysm. No lymphadenopathy. Reproductive: Normal uterus.  No pelvic mass Other: Small amount of free fluid in the pelvis. 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 Musculoskeletal: Diffuse lumbar degenerative changes. No acute skeletal abnormality. IMPRESSION: Acute hemorrhage in the right kidney. Hemorrhage has ruptured into the posterior pararenal space where there is a large acute hematoma. No underlying renal mass is seen on recent CT of 08/21/2018. Hemorrhage is likely due to coagulopathy. These results were called by telephone at the time of interpretation on 11/02/2018 at 8:45 pm to Dr. Dionne BucySEBASTIAN  SIADECKI , who verbally acknowledged these results. Electronically Signed   By: Marlan Palauharles  Clark M.D.   On: 11/02/2018 20:45   Dg Chest Portable 1 View  Result Date: 11/02/2018 CLINICAL DATA:  Fever and hypotension EXAM: PORTABLE CHEST 1 VIEW COMPARISON:  11/02/2018 CT AP FINDINGS: Lung volumes are low. The patient is slightly rotated on this exam. Left atrial enlargement is identified. Aortic atherosclerosis without aneurysm. Mild vascular congestion without acute pulmonary consolidation. Nodule at the left lung base consistent with a granuloma based on same  day CT. Atelectasis is noted at the right lung base. Remote posttraumatic deformity of the right fifth rib. IMPRESSION: 1. Low lung volumes with atelectasis at the right lung base. Mild vascular congestion. 2. Dense nodule at the left lung base consistent with a granuloma as seen on same day CT. Electronically Signed   By: Tollie Eth M.D.   On: 11/02/2018 22:09   US Abdomen Limited Ruq  Result Date: 11/02/2018 CLINICAL DATA:  78 y/o  F; evaluation of cholecystitis. EXAM: ULTRASOUND ABDOMEN LIMITED RIGHT UPPER QUADRANT COMPARISON:  11/02/2018 CT abdomen and pelvis. FINDINGS: Gallbladder: Cholelithiasis. Collapsed gallbladder. Gallbladder wall measures 2.5 mm in thickness. No pericholecystic fluid. Common bile duct: Diameter: 2.5 mm Liver: Increased liver echogenicity. No focal liver lesion identified. Trace perihepatic fluid. Portal vein is patent on color Doppler imaging with normal direction of blood flow towards the liver. Other: Partially visualized collection within the right renal fossa. IMPRESSION: 1. Cholelithiasis.  No secondary signs of acute cholecystitis. 2. Echogenic liver compatible with steatosis. 3. Partially visualized right retroperitoneal collection corresponding to hematoma on CT. Electronically Signed   By: Mitzi Hansen M.D.   On: 11/02/2018 21:07   CT scan was personally reviewed.  Impression/ Plan:  1.  Right  perinephric hematoma-based on her continued decline steady decline in hemoglobin and worsening hypotension, I feel that there is likely still active bleeding.  Continue to recommend supportive care with bedrest, serial hemoglobins, and transfusion as needed.  She is a very poor surgical candidate and high risk for emergent nephrectomy.  I discussed the case with Dr. Wyn Quaker from vascular surgery this morning who is agreed to see the patient and consider catheterization with embolization as deemed necessary.    2.  Acute kidney injury-relatively an uric since admission with rising creatinine, GFR 14.  Would strongly consider placement of Foley catheter to monitor urine output, obtain urine specimen, and optimize drainage.  3.  Sepsis of unknown source-continue broad-spectrum antibiotics.  Agree with broadening to meropenem given her history of cefepime resistance.  Supportive care per critical care team.  11/03/2018, 7:03 AM  Vanna Scotland,  MD

## 2018-11-03 NOTE — Care Management Note (Signed)
Case Management Note  Patient Details  Name: Mikayla [name] MRN: [id number] Date of Birth: 1941-09-01  Subjective/Objective:  Patient admitted with Sepsis.  Patient lives at [facility] and has been there since 2017.  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  Patient is awake and alert with no cognitive deficit.  Family is here at the bedside, patient has 2 daughters, Susa Loffler has HCPOA and [employer]  Patient has chronic O2 at 2L.  Patient can feed herself but needs assistance with all other ADL's, daughter's report she is hard of hearing.  RNCM will cont to follow. Robbie Lis RN BSN [phone]                   Action/Plan:   Expected Discharge Date:  11/06/18               Expected Discharge Plan:  Skilled Nursing Facility  In-House Referral:  Clinical Social Work  Discharge planning Services  CM Consult  Post Acute Care Choice:    Choice offered to:     DME Arranged:    DME Agency:     HH Arranged:    HH Agency:     Status of Service:  Completed, signed off  If discussed at [employer], dates discussed:    Additional Comments:  Allayne Butcher, RN 11/03/2018, 1:54 PM

## 2018-11-03 NOTE — Progress Notes (Signed)
Dr Thedore Mins aware that patient had no urine output. BUN/Creatinine elevated. At this point no additional orders given.

## 2018-11-03 NOTE — Progress Notes (Signed)
No urine output since admission to CCU, bladder scan shows less the . Jeri Modena  NP notified, Nephrology consult placed by NP.

## 2018-11-03 NOTE — H&P (Signed)
[facility] History & Physical Update  The patient was interviewed and re-examined.  The patient's previous History and Physical has been reviewed and is unchanged.  There is no change in the plan of care. We plan to proceed with the scheduled procedure.  Festus Barren, MD  11/03/2018, 11:48 AM

## 2018-11-03 NOTE — Consult Note (Signed)
Date: 11/03/2018                  Patient Name:  Mikayla [name]  MRN: [id number]  DOB: 10/01/1941  Age / Sex: 78 y.o., female         PCP: System, Pcp Not In                 Service Requesting Consult: IM/ Jama Flavorsjie, Jude, MD                 Reason for Consult: ARF            History of Present Illness: Patient is a 78 y.o. female with medical problems of atrial fibrillation, hypertension, bedbound status, who was admitted to [facility] on 11/02/2018 for evaluation of Abdominal pain, excessive burping, some nausea.. She was noted to have altered mental status, nausea, vomiting and right-sided abdominal pain. CT scan of the abdomen done in the emergency room showed acute hemorrhage of the right kidney large hematoma.  This is thought to be secondary to warfarin use for anticoagulation for atrial fibrillation.  Patient has been evaluated by urology and deemed not a good candidate for emergent nephrectomy.  Therefore, vascular surgery was consulted to evaluate for embolization.  She is scheduled for embolization this morning. Patient is also getting broad-spectrum antibiotics cefepime, Flagyl, vancomycin and meropenem Urinalysis not available Serum creatinine critically elevated at 3.1.  Potassium borderline elevated at 5.0   Medications: Outpatient medications: Medications Prior to Admission  Medication Sig Dispense Refill Last Dose  . acetaminophen (TYLENOL) 325 MG tablet Take 650 mg by mouth every 4 (four) hours as needed for mild pain or fever.   PRN at PRN  . amiodarone (PACERONE) 200 MG tablet Take 1 tablet (200 mg total) by mouth daily.   Unknown at Unknown  . carbidopa-levodopa (SINEMET IR) 10-100 MG tablet Take 1 tablet by mouth 3 (three) times daily.   Unknown at Unknown  . ferrous sulfate 324 (65 Fe) MG TBEC Take 1 tablet by mouth daily.   Unknown at Unknown  . furosemide (LASIX) 20 MG tablet Take 1 tablet (20 mg total) by mouth daily. 30 tablet  Unknown at Unknown  . gabapentin  (NEURONTIN) 300 MG capsule Take 300 mg by mouth at bedtime.   Unknown at Unknown  . guaiFENesin (ROBITUSSIN) 100 MG/5ML SOLN Take 10 mLs by mouth every 6 (six) hours as needed for cough or to loosen phlegm. FOR 10 DAYS   PRN at PRN  . ipratropium-albuterol (DUONEB) 0.5-2.5 (3) MG/3ML SOLN Take 3 mLs by nebulization 3 (three) times daily.   Unknown at Unknown  . Melatonin 5 MG TABS Take 5 mg by mouth at bedtime.   Unknown at Unknown  . Multiple Vitamins-Minerals (CENTROVITE) TABS Take 1 tablet by mouth daily.   Unknown at Unknown  . nystatin (NYSTATIN) powder Apply topically 2 (two) times daily as needed. APPLY UNDER NECK TWICE DAILY AS NEEDED FOR YEAST   PRN at PRN  . omeprazole (PRILOSEC OTC) 20 MG tablet Take 20 mg by mouth 2 (two) times daily.   Unknown at Unknown  . ondansetron (ZOFRAN-ODT) 4 MG disintegrating tablet Take 4 mg by mouth every 6 (six) hours as needed for nausea or vomiting. FOR 3 DAYS   PRN at PRN  . oxymetazoline (AFRIN) 0.05 % nasal spray Place 2 sprays into both nostrils 2 (two) times daily. For 3 days   Unknown at Unknown  . PARoxetine (PAXIL) 30 MG tablet  Take 30 mg by mouth daily.    Unknown at Unknown  . potassium chloride SA (K-DUR,KLOR-CON) 20 MEQ tablet Take 20 mEq by mouth daily.   Unknown at Unknown  . senna (SENOKOT) 8.6 MG TABS tablet Take 1 tablet by mouth daily.   Unknown at Unknown  . traZODone (DESYREL) 50 MG tablet Take 25 mg by mouth at bedtime.   Unknown at Unknown  . warfarin (COUMADIN) 5 MG tablet Take 5 mg by mouth daily at 8 pm.   Unknown at Unknown  . carvedilol (COREG) 3.125 MG tablet Take 1 tablet (3.125 mg total) by mouth 2 (two) times daily with a meal. (Patient not taking: Reported on 08/19/2018) 60 tablet 0 Not Taking at Unknown time  . Multiple Vitamin (MULTIVITAMIN WITH MINERALS) TABS tablet Take 1 tablet by mouth daily.   Not Taking at Unknown time    Current medications: Current Facility-Administered Medications  Medication Dose Route  Frequency Provider Last Rate Last Dose  . 0.9 %  sodium chloride infusion (Manually program via Guardrails IV Fluids)   Intravenous Once Harlon Ditty D, NP      . 0.9 %  sodium chloride infusion   Intravenous Continuous Oralia Manis, MD 100 mL/hr at 11/03/18 0700    . acetaminophen (TYLENOL) tablet 650 mg  650 mg Oral Q6H PRN Oralia Manis, MD       Or  . acetaminophen (TYLENOL) suppository 650 mg  650 mg Rectal Q6H PRN Oralia Manis, MD      . carbidopa-levodopa (SINEMET IR) 10-100 MG per tablet immediate release 1 tablet  1 tablet Oral TID Oralia Manis, MD   1 tablet at 11/03/18 [phone]  . insulin aspart (novoLOG) injection 0-20 Units  0-20 Units Subcutaneous Q4H Harlon Ditty D, NP   3 Units at 11/03/18 [phone]  . MEDLINE mouth rinse  15 mL Mouth Rinse BID Oralia Manis, MD      . meropenem [facility]) 500 mg in sodium chloride 0.9 % 100 mL IVPB  500 mg Intravenous Q12H Harlon Ditty D, NP 200 mL/hr at 11/03/18 0906 500 mg at 11/03/18 0906  . metroNIDAZOLE (FLAGYL) IVPB 500 mg  500 mg Intravenous Willow Ora, MD   Stopped at 11/03/18 0424  . norepinephrine (LEVOPHED) 4 mg in dextrose 5 % 250 mL (0.016 mg/mL) infusion  0-40 mcg/min Intravenous Titrated Harlon Ditty D, NP 18.75 mL/hr at 11/03/18 0700 5 mcg/min at 11/03/18 0700  . ondansetron (ZOFRAN) tablet 4 mg  4 mg Oral Q6H PRN Oralia Manis, MD       Or  . ondansetron [facility]) injection 4 mg  4 mg Intravenous Q6H PRN Oralia Manis, MD      . pantoprazole (PROTONIX) injection 40 mg  40 mg Intravenous Q12H Harlon Ditty D, NP   40 mg at 11/03/18 0906  . vancomycin variable dose per unstable renal function (pharmacist dosing)   Does not apply See admin instructions Gardner Candle, RPH          Allergies: Allergies  Allergen Reactions  . Levaquin [Levofloxacin]   . Other Nausea And Vomiting  . Sulfa Antibiotics Hives  . Oxycodone Anxiety and Other (See Comments)      Past Medical History: Past Medical History:   Diagnosis Date  . Atrial fibrillation (HCC)   . Empyema [facility])    in [facility] Aug 2017- stayed in hospital on for 2 months.  . Hypertension      Past Surgical History: Past Surgical History:  Procedure Laterality  Date  . ERCP N/A 08/23/2018   Procedure: ENDOSCOPIC RETROGRADE CHOLANGIOPANCREATOGRAPHY (ERCP);  Surgeon: Midge Minium, MD;  Location: [facility] ENDOSCOPY;  Service: Endoscopy;  Laterality: N/A;  . PEG TUBE PLACEMENT       Family History: Family History  Problem Relation Age of Onset  . Hypertension Mother   . COPD Mother   . Lung cancer Father   . Liver cancer Sister      Social History: Social History   Socioeconomic History  . Marital status: Widowed    Spouse name: Not on file  . Number of children: Not on file  . Years of education: Not on file  . Highest education level: Not on file  Occupational History  . Not on file  Social Needs  . Financial resource strain: Not on file  . Food insecurity:    Worry: Not on file    Inability: Not on file  . Transportation needs:    Medical: Not on file    Non-medical: Not on file  Tobacco Use  . Smoking status: Former Smoker    Types: Cigarettes  . Smokeless tobacco: Never Used  . Tobacco comment: quit in 2001. smoked for 30 years  Substance and Sexual Activity  . Alcohol use: No  . Drug use: No  . Sexual activity: Never  Lifestyle  . Physical activity:    Days per week: Not on file    Minutes per session: Not on file  . Stress: Not on file  Relationships  . Social connections:    Talks on phone: Not on file    Gets together: Not on file    Attends religious service: Not on file    Active member of club or organization: Not on file    Attends meetings of clubs or organizations: Not on file    Relationship status: Not on file  . Intimate partner violence:    Fear of current or ex partner: Not on file    Emotionally abused: Not on file    Physically abused: Not on file    Forced sexual activity: Not on  file  Other Topics Concern  . Not on file  Social History Narrative  . Not on file     Review of Systems: Not reliable because of patient critical condition.  See HPI. Gen:  HEENT:  CV:  Resp:  GI: GU :  MS:  Derm:   Psych: Heme:  Neuro:  Endocrine  Vital Signs: Blood pressure 114/65, pulse 97, temperature 98.9 F (37.2 C), temperature source Oral, resp. rate 15, height 5\' 4"  (1.626 m), weight 109.3 kg, SpO2 98 %.   Intake/Output Summary (Last 24 hours) at 11/03/2018 0957 Last data filed at 11/03/2018 0700 Gross per 24 hour  Intake 1448.3 ml  Output -  Net 1448.3 ml    Weight trends: Filed Weights   11/02/18 2346 11/02/18 2351  Weight: 109.3 kg 109.3 kg    Physical Exam: General:  Obese, elderly frail, laying in the bed  HEENT  mouth is dry, decreased hearing  Neck: Supple, no masses  Lungs:  Coarse breath sounds bilaterally, nasal cannula oxygen  Heart::  No rub  Abdomen:  Soft, nontender  Extremities:  No edema  Neurologic:  Alert, able to answer simple questions appropriately, left hand tremor  Skin:  No acute rashes  Foley:  purewick system        Lab results: Basic Metabolic Panel: Recent Labs  Lab 11/02/18 1905 11/03/18 0410  NA 135  136  K 4.8 5.0  CL 93* 100  CO2 23 25  GLUCOSE 241* 162*  BUN 30* 35*  CREATININE 3.03* 3.10*  CALCIUM 8.6* 7.7*    Liver Function Tests: Recent Labs  Lab 11/02/18 1905  AST 212*  ALT 83*  ALKPHOS 83  BILITOT 0.6  PROT 7.0  ALBUMIN 3.3*   Recent Labs  Lab 11/02/18 1905  LIPASE 18   No results for input(s): AMMONIA in the last 168 hours.  CBC: Recent Labs  Lab 11/02/18 1856 11/03/18 0039 11/03/18 0410  WBC 35.9*  --  41.4*  NEUTROABS 30.5*  --   --   HGB 8.8* 7.5* 7.0*  HCT 30.6* 25.1* 22.6*  MCV 94.7  --  91.5  PLT 447*  --  345    Cardiac Enzymes: No results for input(s): CKTOTAL, TROPONINI in the last 168 hours.  BNP: Invalid input(s): POCBNP  CBG: Recent Labs  Lab  11/02/18 2335 11/03/18 0431 11/03/18 0753  GLUCAP 113* 152* 127*    Microbiology: Recent Results (from the past 720 hour(s))  Culture, blood (Routine x 2)     Status: None (Preliminary result)   Collection Time: 11/02/18  7:06 PM  Result Value Ref Range Status   Specimen Description BLOOD LEFT ANTECUBITAL  Final   Special Requests   Final    BOTTLES DRAWN AEROBIC AND ANAEROBIC Blood Culture adequate volume   Culture   Final    NO GROWTH < 12 HOURS Performed at [facility], [address], [place], [place] [address]    Report Status PENDING  Incomplete  MRSA PCR Screening     Status: None   Collection Time: 11/02/18 11:43 PM  Result Value Ref Range Status   MRSA by PCR NEGATIVE NEGATIVE Final    Comment:        The GeneXpert MRSA Assay (FDA approved for NASAL specimens only), is one component of a comprehensive MRSA colonization surveillance program. It is not intended to diagnose MRSA infection nor to guide or monitor treatment for MRSA infections. Performed at [facility], [address] [place], [place] [address]   Culture, blood (Routine x 2)     Status: None (Preliminary result)   Collection Time: 11/03/18 12:38 AM  Result Value Ref Range Status   Specimen Description BLOOD RIGHT ANTECUBITAL  Final   Special Requests   Final    BOTTLES DRAWN AEROBIC AND ANAEROBIC Blood Culture results may not be optimal due to an excessive volume of blood received in culture bottles   Culture   Final    NO GROWTH < 12 HOURS Performed at [facility], [address], [place], [place] [address]    Report Status PENDING  Incomplete     Coagulation Studies: Recent Labs    11/02/18 1856 11/03/18 0410  LABPROT 31.2* 15.9*  INR 3.06 1.28    Urinalysis: No results for input(s): COLORURINE, LABSPEC, PHURINE, GLUCOSEU, HGBUR, BILIRUBINUR, KETONESUR, PROTEINUR, UROBILINOGEN, NITRITE, LEUKOCYTESUR in the last 72 hours.  Invalid input(s):  APPERANCEUR      Imaging: Ct Abdomen Pelvis Wo Contrast  Result Date: 11/02/2018 CLINICAL DATA:  Acute abdominal pain.  Fever.  On Coumadin. EXAM: CT ABDOMEN AND PELVIS WITHOUT CONTRAST TECHNIQUE: Multidetector CT imaging of the abdomen and pelvis was performed following the standard protocol without IV contrast. COMPARISON:  CT abdomen pelvis 08/21/2018 FINDINGS: Lower chest: Calcified granuloma left lower lobe. Multifocal nodular densities right lung base posteriorly have progressed. Probable mucous plugging. No effusion.  Hepatobiliary: Small calcified gallstones. No biliary dilatation. No focal liver lesion. Pancreas: Pancreatic atrophy.  No mass or edema Spleen: Negative Adrenals/Urinary Tract: Hyperdense right renal hemorrhage has developed since the prior study. This involves the upper mid and lower pole extends into the perirenal space laterally. Hematoma is also present in the retroperitoneum posteriorly which appears to connect with the upper pole of the right renal hemorrhage. Posterior pararenal hematoma measures approximately 9 x 11 x 16 cm and also is hyperdense. No underlying renal lesion is seen on the recent CT. Patient is on Coumadin with INR of 3. Left kidney is atrophic without focal abnormality. Bladder is empty. Stomach/Bowel: Negative for bowel obstruction. No bowel mass or edema. Vascular/Lymphatic: Atherosclerotic aorta without aneurysm. No lymphadenopathy. Reproductive: Normal uterus.  No pelvic mass Other: Small amount of free fluid in the pelvis. 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 Musculoskeletal: Diffuse lumbar degenerative changes. No acute skeletal abnormality. IMPRESSION: Acute hemorrhage in the right kidney. Hemorrhage has ruptured into the posterior pararenal space where there is a large acute hematoma. No underlying renal mass is seen on recent CT of 08/21/2018. Hemorrhage is likely due to coagulopathy. These results were called by telephone at the time of  interpretation on 11/02/2018 at 8:45 pm to Dr. Dionne BucySEBASTIAN SIADECKI , who verbally acknowledged these results. Electronically Signed   By: Marlan Palauharles  Clark M.D.   On: 11/02/2018 20:45   Dg Chest Portable 1 View  Result Date: 11/02/2018 CLINICAL DATA:  Fever and hypotension EXAM: PORTABLE CHEST 1 VIEW COMPARISON:  11/02/2018 CT AP FINDINGS: Lung volumes are low. The patient is slightly rotated on this exam. Left atrial enlargement is identified. Aortic atherosclerosis without aneurysm. Mild vascular congestion without acute pulmonary consolidation. Nodule at the left lung base consistent with a granuloma based on same day CT. Atelectasis is noted at the right lung base. Remote posttraumatic deformity of the right fifth rib. IMPRESSION: 1. Low lung volumes with atelectasis at the right lung base. Mild vascular congestion. 2. Dense nodule at the left lung base consistent with a granuloma as seen on same day CT. Electronically Signed   By: Tollie Ethavid  Kwon M.D.   On: 11/02/2018 22:09   [place] Abdomen Limited Ruq  Result Date: 11/02/2018 CLINICAL DATA:  78 y/o  F; evaluation of cholecystitis. EXAM: ULTRASOUND ABDOMEN LIMITED RIGHT UPPER QUADRANT COMPARISON:  11/02/2018 CT abdomen and pelvis. FINDINGS: Gallbladder: Cholelithiasis. Collapsed gallbladder. Gallbladder wall measures 2.5 mm in thickness. No pericholecystic fluid. Common bile duct: Diameter: 2.5 mm Liver: Increased liver echogenicity. No focal liver lesion identified. Trace perihepatic fluid. Portal vein is patent on color Doppler imaging with normal direction of blood flow towards the liver. Other: Partially visualized collection within the right renal fossa. IMPRESSION: 1. Cholelithiasis.  No secondary signs of acute cholecystitis. 2. Echogenic liver compatible with steatosis. 3. Partially visualized right retroperitoneal collection corresponding to hematoma on CT. Electronically Signed   By: Mitzi HansenLance  Furusawa-Stratton M.D.   On: 11/02/2018 21:07      Assessment &  Plan: Pt is a 78 y.o. caucasian  female with atrial fibrillation, hypertension, Parkinson's, was admitted on 11/02/2018 with atrial fibrillation, hypertension, bedbound status, who was admitted to [facility] on 11/02/2018 for evaluation of Abdominal pain and is found to have acute right renal hemorrhage with large acute hematoma, fever, hypotension, sepsis, AKI  1.  Acute kidney injury Baseline creatinine 0.76 from August 24, 2018 Acute kidney injury is likely multifactorial from hypotension, hemorrhage, volume loss, possible sepsis Patient will undergo embolization of  the right kidney by vascular surgery Discussed with patient's daughters that there is a small chance patient might need dialysis during this admission.  It will be most likely short-term for AKI but there is possibility of chronic dialysis.  Volume status and electrolytes are acceptable at present.  No acute indication. Borderline hyperkalemia, will monitor.   2.  Hyperglycemia Elevated blood sugars are noted, will order hemoglobin A1c  3. Sepsis, hypotension, lactic acidosis Volume resuscitation, broad-spectrum antibiotics as per ICU team  4. Rt renal hemorrhage - embolization planned by vascular surgery     LOS: 1 Tijuan Dantes Thedore Mins 1/9/20209:57 AM  [facility] [place], [place] [phone]  Note: This note was prepared with Dragon dictation. Any transcription errors are unintentional

## 2018-11-03 NOTE — Progress Notes (Signed)
Spoke with Dr Thedore Mins regarding no urine output for patient since my shift at 7 am. Orders for BMP to check potassium level. At this time no additional orders. Per MD if potassium level normal will recheck tomorrow in the am.

## 2018-11-03 NOTE — Op Note (Signed)
[facility] Percutaneous Study/Intervention Procedural Note   Date of Surgery: 11/03/2018  Surgeon(s): Festus Barren  Assistants:none  Pre-operative Diagnosis: Right perinephric hematoma with hypotension requiring pressors  Post-operative diagnosis: Same  Procedure(s) Performed: 1. Ultrasound guidance for vascular access right femoral artery 2. Catheter placement into right renal artery branches from right femoral approach 3. Aortogram and selective right renal angiogram  4. Coil embolization of the right main renal artery and most inferior branch using a total of 4 Ruby coils 5. Right femoral venous triple-lumen catheter placement with ultrasound and fluoroscopic guidance  6. StarClose closure device right femoral artery  EBL: 10 cc  Contrast: 30 cc  Fluoro Time: 2.7 min  Moderate Conscious Sedation time: approximately 30 minutes using 3 mg of Versed and 100 Mcg of Fentanyl  Indications: Patient is a 78 y.o. female with a huge right perinephric hematoma with a massive drop in her hemoglobin and hypotension requiring pressors. The patient is brought in for angiography for further evaluation and potential treatment. Risks and benefits are discussed and informed consent is obtained  Procedure: The patient was identified and appropriate procedural time out was performed. The patient was then placed supine on the table and prepped and draped in the usual sterile fashion. Moderate conscious sedation was administered throughout the procedure with a face to face encounter with the patient throughout and with my supervision of the RN administering medicines and monitoring the patients vital signs and mental status throughout from the start of the procedure until the patient was taken to the recovery room.  Ultrasound was used to evaluate the right common femoral artery. It was patent . A  digital ultrasound image was acquired. A Seldinger needle was used to access the right common femoral artery under direct ultrasound guidance and a permanent image was performed. A 0.035 J wire was advanced without resistance and a 5Fr sheath was placed. Pigtail catheter was placed into the aorta and an AP aortogram was performed. This demonstrated normal renal arteries and normal aorta and iliac segments without significant stenosis. I then used a C2 catheter to selectively cannulate the right renal artery.  No significant right renal artery stenosis was seen with a typical branching appearance at the hilum of the kidney.  There was not an obvious blush but given the patient's ongoing hypotension requiring pressors and the pressure from the large hematoma, that was not entirely surprising.  I advanced a pro-grate microcatheter out into the inferior and middle renal artery branches and selective imaging was performed without obvious blush seen.  At this point, I contacted the urology service to discuss the situation.  Her risk for recurrent bleeding when she was normotensive and off pressors was quite high, and if she had this her risk of death was very significant.  She was not really a surgical candidate for nephrectomy or any other procedure and embolization was her best option to avoid further bleeding.  Given the situation, it was felt that embolization of the right renal artery in its entirety would be most prudent.  I then began performing embolization starting in the lower pole right renal artery embolization was then carried back into the main renal artery and approximately 1 to 2 cm before the renal artery bifurcation.  A total of 4 Ruby coils including a 5 mm x 15 cm coil, a packing coil, a 4 mm x 20 cm coil, and a 6 mm x 15 cm coil were used to complete embolization.  The embolization  was complete with this and occlusion of the main right renal artery with coils extending into the lower pole branch  were seen on completion imaging. I elected to terminate the procedure. The sheath was removed and StarClose closure device was deployed in the left femoral artery with excellent hemostatic result.  We were also asked to place a central line by the intensive care service.  As her groin was already prepped, the right femoral vein was visualized with ultrasound and found to be widely patent.  It was then accessed under direct ultrasound guidance without difficulty with a Seldinger needle and a permanent image was recorded.  A J-wire was placed and the needle was removed.  After skin nick and dilatation a triple-lumen catheter was placed over the wire and the wire was removed.  The catheter tips were parked in the distal inferior vena cava/proximal common iliac vein with fluoroscopy.  All 3 lumens withdrew blood well and flushed easily with heparinized saline.  It was secured to the skin with 3 silk sutures.  The patient was taken to the recovery room in stable condition having tolerated the procedure well.  Findings:  Aortogram:Normal aorta and iliac arteries without significant stenosis.  Renal artery origins appear to be normal bilaterally.  The SMA and celiac artery had brisk flow although the origins were not well seen on the AP projection. Right Renal artery: No significant renal artery stenosis with a typical branching appearance at the hilum of the kidney.  No obvious blush was identified likely secondary to the patient's hypotension having been on pressors for several hours as well as pressure from the large hematoma.   Disposition: Patient was taken to the recovery room in stable condition having tolerated the procedure well.  Complications: None  Festus Barren 11/03/2018 12:46 PM     This note was created with Dragon Medical transcription system. Any errors in dictation are purely unintentional.

## 2018-11-03 NOTE — Progress Notes (Signed)
Spoke with Dr Thedore Mins regarding patients potassium level 5, bun 36 and creatinine 3.12. Patient has had no urine output for my shift 7:00am. At this point no additional orders. Md states he will recheck labs in the am.

## 2018-11-03 NOTE — Progress Notes (Signed)
*  PRELIMINARY RESULTS* Echocardiogram 2D Echocardiogram has been performed.  Mikayla [name] 11/03/2018, 10:46 AM

## 2018-11-04 DIAGNOSIS — Z66 Do not resuscitate: Secondary | ICD-10-CM

## 2018-11-04 DIAGNOSIS — I480 Paroxysmal atrial fibrillation: Secondary | ICD-10-CM

## 2018-11-04 DIAGNOSIS — Z7189 Other specified counseling: Secondary | ICD-10-CM

## 2018-11-04 DIAGNOSIS — S37011A Minor contusion of right kidney, initial encounter: Secondary | ICD-10-CM

## 2018-11-04 DIAGNOSIS — Z515 Encounter for palliative care: Secondary | ICD-10-CM

## 2018-11-04 LAB — PREPARE RBC (CROSSMATCH)

## 2018-11-04 LAB — TYPE AND SCREEN
ABO/RH(D): A POS
Antibody Screen: NEGATIVE
Unit division: 0
Unit division: 0
Unit division: 0

## 2018-11-04 LAB — HEMOGLOBIN AND HEMATOCRIT, BLOOD
HCT: 22.7 % — ABNORMAL LOW (ref 36.0–46.0)
HCT: 25.4 % — ABNORMAL LOW (ref 36.0–46.0)
HEMOGLOBIN: 7.2 g/dL — AB (ref 12.0–15.0)
Hemoglobin: 8.1 g/dL — ABNORMAL LOW (ref 12.0–15.0)

## 2018-11-04 LAB — BASIC METABOLIC PANEL
Anion gap: 8 (ref 5–15)
BUN: 39 mg/dL — ABNORMAL HIGH (ref 8–23)
CO2: 26 mmol/L (ref 22–32)
Calcium: 7.9 mg/dL — ABNORMAL LOW (ref 8.9–10.3)
Chloride: 101 mmol/L (ref 98–111)
Creatinine, Ser: 2.98 mg/dL — ABNORMAL HIGH (ref 0.44–1.00)
GFR calc Af Amer: 17 mL/min — ABNORMAL LOW (ref >60)
GFR calc non Af Amer: 14 mL/min — ABNORMAL LOW (ref >60)
Glucose, Bld: 111 mg/dL — ABNORMAL HIGH (ref 70–99)
Potassium: 4.4 mmol/L (ref 3.5–5.1)
Sodium: 135 mmol/L (ref 135–145)

## 2018-11-04 LAB — BPAM RBC
BLOOD PRODUCT EXPIRATION DATE: 202001272359
Blood Product Expiration Date: 202001142359
Blood Product Expiration Date: 202001272359
ISSUE DATE / TIME: 202001090820
ISSUE DATE / TIME: 202001100325
Unit Type and Rh: 600
Unit Type and Rh: 6200
Unit Type and Rh: 6200

## 2018-11-04 LAB — GLUCOSE, CAPILLARY
Glucose-Capillary: 109 mg/dL — ABNORMAL HIGH (ref 70–99)
Glucose-Capillary: 118 mg/dL — ABNORMAL HIGH (ref 70–99)
Glucose-Capillary: 80 mg/dL (ref 70–99)
Glucose-Capillary: 90 mg/dL (ref 70–99)
Glucose-Capillary: 94 mg/dL (ref 70–99)
Glucose-Capillary: 98 mg/dL (ref 70–99)

## 2018-11-04 LAB — CBC
HCT: 23.4 % — ABNORMAL LOW (ref 36.0–46.0)
Hemoglobin: 7.5 g/dL — ABNORMAL LOW (ref 12.0–15.0)
MCH: 28.6 pg (ref 26.0–34.0)
MCHC: 32.1 g/dL (ref 30.0–36.0)
MCV: 89.3 fL (ref 80.0–100.0)
PLATELETS: 246 10*3/uL (ref 150–400)
RBC: 2.62 MIL/uL — ABNORMAL LOW (ref 3.87–5.11)
RDW: 15.7 % — ABNORMAL HIGH (ref 11.5–15.5)
WBC: 29.1 10*3/uL — ABNORMAL HIGH (ref 4.0–10.5)
nRBC: 0.1 % (ref 0.0–0.2)

## 2018-11-04 LAB — PROTIME-INR
INR: 1.39
Prothrombin Time: 16.9 seconds — ABNORMAL HIGH (ref 11.4–15.2)

## 2018-11-04 LAB — ECHOCARDIOGRAM COMPLETE
Height: 64 in
Weight: 3855.4 oz

## 2018-11-04 LAB — PROCALCITONIN: Procalcitonin: 1.43 ng/mL

## 2018-11-04 MED ORDER — ACETAMINOPHEN 325 MG PO TABS
650.0000 mg | ORAL_TABLET | Freq: Once | ORAL | Status: AC
  Administered 2018-11-04: 650 mg via ORAL
  Filled 2018-11-04: qty 2

## 2018-11-04 MED ORDER — PANTOPRAZOLE SODIUM 40 MG PO TBEC
40.0000 mg | DELAYED_RELEASE_TABLET | Freq: Two times a day (BID) | ORAL | Status: DC
  Administered 2018-11-04: 40 mg via ORAL
  Filled 2018-11-04: qty 1

## 2018-11-04 MED ORDER — SODIUM CHLORIDE 0.9% IV SOLUTION
Freq: Once | INTRAVENOUS | Status: AC
  Administered 2018-11-04: 18:00:00 via INTRAVENOUS

## 2018-11-04 MED ORDER — NOREPINEPHRINE-SODIUM CHLORIDE 4-0.9 MG/250ML-% IV SOLN: 0.0000 ug/min | INTRAVENOUS | Status: DC

## 2018-11-04 MED ORDER — SODIUM CHLORIDE 0.9% FLUSH
10.0000 mL | Freq: Two times a day (BID) | INTRAVENOUS | Status: DC
  Administered 2018-11-04 – 2018-11-08 (×6): 10 mL

## 2018-11-04 MED ORDER — GUAIFENESIN 100 MG/5ML PO SOLN
5.0000 mL | ORAL | Status: DC | PRN
  Administered 2018-11-04: 100 mg via ORAL
  Filled 2018-11-04 (×2): qty 5

## 2018-11-04 MED ORDER — SODIUM CHLORIDE 0.9% FLUSH
10.0000 mL | INTRAVENOUS | Status: DC | PRN
  Administered 2018-11-06: 40 mL
  Administered 2018-11-07: 20 mL
  Filled 2018-11-04 (×2): qty 40

## 2018-11-04 NOTE — Progress Notes (Signed)
Urology Consult Follow Up  Subjective: Status post embolization by Dr. dew yesterday of right kidney.  Fever after receiving blood product yesterday.  Urine output remains low but improving.  Creatinine improving.  Blood pressure stabilized without blood pressure support.  Anti-infectives: Anti-infectives (From admission, onward)   Start     Dose/Rate Route Frequency Ordered Stop   11/03/18 0739  vancomycin variable dose per unstable renal function (pharmacist dosing)  Status:  Discontinued      Does not apply See admin instructions 11/03/18 0739 11/03/18 1055   11/03/18 0000  vancomycin (VANCOCIN) 1,500 mg in sodium chloride 0.9 % 500 mL IVPB     1,500 mg 250 mL/hr over 120 Minutes Intravenous  Once 11/02/18 2349 11/03/18 0251   11/03/18 0000  meropenem (MERREM) 500 mg in sodium chloride 0.9 % 100 mL IVPB     500 mg 200 mL/hr over 30 Minutes Intravenous Every 12 hours 11/02/18 2351     11/02/18 2200  meropenem (MERREM) 1 g in sodium chloride 0.9 % 100 mL IVPB  Status:  Discontinued     1 g 200 mL/hr over 30 Minutes Intravenous Every 8 hours 11/02/18 2148 11/02/18 2350   11/02/18 1945  ceFEPIme (MAXIPIME) 2 g in sodium chloride 0.9 % 100 mL IVPB     2 g 200 mL/hr over 30 Minutes Intravenous  Once 11/02/18 1936 11/02/18 2233   11/02/18 1945  metroNIDAZOLE (FLAGYL) IVPB 500 mg  Status:  Discontinued     500 mg 100 mL/hr over 60 Minutes Intravenous Every 8 hours 11/02/18 1936 11/03/18 1055      Current Facility-Administered Medications  Medication Dose Route Frequency Provider Last Rate Last Dose  . 0.9 %  sodium chloride infusion (Manually program via Guardrails IV Fluids)   Intravenous Once Annice Needy, MD      . 0.9 %  sodium chloride infusion   Intravenous Continuous Annice Needy, MD   Stopped at 11/04/18 [phone]  . acetaminophen (TYLENOL) tablet 650 mg  650 mg Oral Q6H PRN Annice Needy, MD       Or  . acetaminophen (TYLENOL) suppository 650 mg  650 mg Rectal Q6H PRN Annice Needy, MD    650 mg at 11/04/18 0403  . carbidopa-levodopa (SINEMET IR) 10-100 MG per tablet immediate release 1 tablet  1 tablet Oral TID Annice Needy, MD   1 tablet at 11/03/18 0907  . HYDROmorphone (DILAUDID) injection 1 mg  1 mg Intravenous Once PRN Annice Needy, MD      . insulin aspart (novoLOG) injection 0-20 Units  0-20 Units Subcutaneous Q4H Annice Needy, MD   3 Units at 11/03/18 [phone]  . ipratropium-albuterol (DUONEB) 0.5-2.5 (3) MG/3ML nebulizer solution 3 mL  3 mL Nebulization Q6H PRN Annice Needy, MD   3 mL at 11/03/18 1756  . MEDLINE mouth rinse  15 mL Mouth Rinse BID Annice Needy, MD   15 mL at 11/04/18 0914  . meropenem (MERREM) 500 mg in sodium chloride 0.9 % 100 mL IVPB  500 mg Intravenous Q12H Annice Needy, MD 200 mL/hr at 11/04/18 0910 500 mg at 11/04/18 0910  . norepinephrine (LEVOPHED) 4mg  in NS premix infusion  0-40 mcg/min Intravenous Titrated Bertram Savin, RPH      . ondansetron (ZOFRAN) tablet 4 mg  4 mg Oral Q6H PRN Annice Needy, MD       Or  . ondansetron (ZOFRAN) injection 4 mg  4 mg  Intravenous Q6H PRN Annice Needy, MD      . pantoprazole (PROTONIX) injection 40 mg  40 mg Intravenous Q12H Annice Needy, MD   40 mg at 11/04/18 0911  . sodium chloride flush (NS) 0.9 % injection 10-40 mL  10-40 mL Intracatheter Q12H Harlon Ditty D, NP   10 mL at 11/04/18 0911  . sodium chloride flush (NS) 0.9 % injection 10-40 mL  10-40 mL Intracatheter PRN Judithe Modest, NP         Objective: Vital signs in last 24 hours: Temp:  [98.7 F (37.1 C)-101.4 F (38.6 C)] 98.9 F (37.2 C) (01/10 0700) Pulse Rate:  [78-99] 87 (01/10 1300) Resp:  [16-36] 25 (01/10 1300) BP: (98-160)/(46-85) 107/69 (01/10 1300) SpO2:  [96 %-100 %] 96 % (01/10 1300)  Intake/Output from previous day: 01/09 0701 - 01/10 0700 In: 2425.4 [I.V.:1925.4; Blood:300; IV Piggyback:200.1] Out: 235 [Urine:235] Intake/Output this shift: No intake/output data recorded.   Physical Exam  Awake, alert.   Daughters at bedside. No respiratory distress Abdomen is obese, nontender Suctioning device in place, concentrated urine in canister.  Lab Results:  Recent Labs    11/03/18 0410  11/03/18 2314 11/04/18 0855  WBC 41.4*  --   --  29.1*  HGB 7.0*   < > 6.6* 7.5*  HCT 22.6*   < > 21.1* 23.4*  PLT 345  --   --  246   < > = values in this interval not displayed.   BMET Recent Labs    11/03/18 1500 11/04/18 0855  NA 137 135  K 5.0 4.4  CL 100 101  CO2 27 26  GLUCOSE 132* 111*  BUN 36* 39*  CREATININE 3.12* 2.98*  CALCIUM 7.8* 7.9*   PT/INR Recent Labs    11/03/18 2140 11/04/18 0855  LABPROT 16.9* 16.9*  INR 1.39 1.39   ABG No results for input(s): PHART, HCO3 in the last 72 hours.  Invalid input(s): PCO2, PO2  Studies/Results: Ct Abdomen Pelvis Wo Contrast  Result Date: 11/02/2018 CLINICAL DATA:  Acute abdominal pain.  Fever.  On Coumadin. EXAM: CT ABDOMEN AND PELVIS WITHOUT CONTRAST TECHNIQUE: Multidetector CT imaging of the abdomen and pelvis was performed following the standard protocol without IV contrast. COMPARISON:  CT abdomen pelvis 08/21/2018 FINDINGS: Lower chest: Calcified granuloma left lower lobe. Multifocal nodular densities right lung base posteriorly have progressed. Probable mucous plugging. No effusion. Hepatobiliary: Small calcified gallstones. No biliary dilatation. No focal liver lesion. Pancreas: Pancreatic atrophy.  No mass or edema Spleen: Negative Adrenals/Urinary Tract: Hyperdense right renal hemorrhage has developed since the prior study. This involves the upper mid and lower pole extends into the perirenal space laterally. Hematoma is also present in the retroperitoneum posteriorly which appears to connect with the upper pole of the right renal hemorrhage. Posterior pararenal hematoma measures approximately 9 x 11 x 16 cm and also is hyperdense. No underlying renal lesion is seen on the recent CT. Patient is on Coumadin with INR of 3. Left kidney is  atrophic without focal abnormality. Bladder is empty. Stomach/Bowel: Negative for bowel obstruction. No bowel mass or edema. Vascular/Lymphatic: Atherosclerotic aorta without aneurysm. No lymphadenopathy. Reproductive: Normal uterus.  No pelvic mass Other: Small amount of free fluid in the pelvis. 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 Musculoskeletal: Diffuse lumbar degenerative changes. No acute skeletal abnormality. IMPRESSION: Acute hemorrhage in the right kidney. Hemorrhage has ruptured into the posterior pararenal space where there is a large acute hematoma. No underlying renal  mass is seen on recent CT of 08/21/2018. Hemorrhage is likely due to coagulopathy. These results were called by telephone at the time of interpretation on 11/02/2018 at 8:45 pm to Dr. Dionne BucySEBASTIAN SIADECKI , who verbally acknowledged these results. Electronically Signed   By: Marlan Palauharles  Clark M.D.   On: 11/02/2018 20:45   [place] Renal  Result Date: 11/03/2018 CLINICAL DATA:  Acute renal failure EXAM: RENAL / URINARY TRACT ULTRASOUND COMPLETE COMPARISON:  CT abdomen pelvis of 11/02/2018 FINDINGS: Right Kidney: Renal measurements: 11.5 x 5.0 x 6.3 cm. = volume: 188 mL. No hydronephrosis is seen. However there is a hypoechoic structure along the upper pole of the right kidney measuring 7.9 x 4.9 x 9.1 cm. This is consistent with the pararenal hematoma noted on recent CT. Left Kidney: Renal measurements: 11.4 x 5.0 x 4.8 cm. = volume: 142 mL. No hydronephrosis is seen. The echogenicity of the renal parenchyma is within normal limits. Bladder: The urinary bladder is moderately distended with no abnormality noted. IMPRESSION: 1. No hydronephrosis. 2. Apparent right pararenal hematoma of 7.9 x 4.9 x 9.1 cm, when compared to recent CT of the abdomen pelvis. Electronically Signed   By: Dwyane DeePaul  Barry M.D.   On: 11/03/2018 15:59   Dg Chest Portable 1 View  Result Date: 11/02/2018 CLINICAL DATA:  Fever and hypotension EXAM: PORTABLE CHEST 1 VIEW  COMPARISON:  11/02/2018 CT AP FINDINGS: Lung volumes are low. The patient is slightly rotated on this exam. Left atrial enlargement is identified. Aortic atherosclerosis without aneurysm. Mild vascular congestion without acute pulmonary consolidation. Nodule at the left lung base consistent with a granuloma based on same day CT. Atelectasis is noted at the right lung base. Remote posttraumatic deformity of the right fifth rib. IMPRESSION: 1. Low lung volumes with atelectasis at the right lung base. Mild vascular congestion. 2. Dense nodule at the left lung base consistent with a granuloma as seen on same day CT. Electronically Signed   By: Tollie Ethavid  Kwon M.D.   On: 11/02/2018 22:09   [place] Abdomen Limited Ruq  Result Date: 11/02/2018 CLINICAL DATA:  78 y/o  F; evaluation of cholecystitis. EXAM: ULTRASOUND ABDOMEN LIMITED RIGHT UPPER QUADRANT COMPARISON:  11/02/2018 CT abdomen and pelvis. FINDINGS: Gallbladder: Cholelithiasis. Collapsed gallbladder. Gallbladder wall measures 2.5 mm in thickness. No pericholecystic fluid. Common bile duct: Diameter: 2.5 mm Liver: Increased liver echogenicity. No focal liver lesion identified. Trace perihepatic fluid. Portal vein is patent on color Doppler imaging with normal direction of blood flow towards the liver. Other: Partially visualized collection within the right renal fossa. IMPRESSION: 1. Cholelithiasis.  No secondary signs of acute cholecystitis. 2. Echogenic liver compatible with steatosis. 3. Partially visualized right retroperitoneal collection corresponding to hematoma on CT. Electronically Signed   By: Mitzi HansenLance  Furusawa-Stratton M.D.   On: 11/02/2018 21:07    Impression/ Plan:  1.  Right perinephric hematoma- Postprocedure day 1 status post angioembolization of right kidney.  Continue to check serial hemoglobin and transfuse as needed.  Discussed with family, anticipate overall worsened renal function, possible fevers and leukocytosis as well as flank pain from  embolization as normal post procedure course.  Manage pain as needed.   2.  Acute kidney injury-remains oliguric but urine output appears to be increasing.  Creatinine also improving slightly, GFR 18.  Followed by nephrology.  3.  Sepsis of unknown source-continue broad-spectrum antibiotics.  Blood cultures negative to date.  Never obtained UA/urine culture.    LOS: 2 days    Mikayla [name] 11/04/2018  Urology will follow remotely, monitor creatinine and blood counts by chart review daily.  We will see again as needed, please feel free to reach out with any questions or concerns.

## 2018-11-04 NOTE — Consult Note (Signed)
Consultation Note Date: 11/04/2018   Patient Name: Mikayla [name]  DOB: 1941/02/06  MRN: [id number]  Age / Sex: 78 y.o., female  PCP: System, Pcp Not In Referring Physician: Shaune Pollack, MD  Reason for Consultation: Establishing goals of care  HPI/Patient Profile: 78 y.o. female admitted on 11/02/2018 from [name] with complaints of abdominal pain and vomiting. She has a past medical history of atrial fibrillation, empyema, and hypertension. During her ED course she was found to have severe sepsis with AKI, hypertension, and lactic acid of 8. CT scan showed right renal hemorrhage with some bleeding into the pararenal space and large hematoma. Patient was given reversal agents due Warfarin history for a-fib. Since admission she has been followed by Nephrology, Urology, and Vascular Surgery. She was deemed a poor surgical candidate and high risk for emergent nephrectomy. Vascular successfully performed catheterization with embolization on 1/9. Family aware that patient is at risk for worsening kidney function which could potentially require temporary or permanent dialysis. Palliative Medicine team consulted for goals of care.   Clinical Assessment and Goals of Care: I have reviewed medical records including lab results, imaging, Epic notes, and MAR, received report from the bedside RN, and assessed the patient. I spoke with Mikayla [name] (daughter/POA)  to discuss diagnosis prognosis, GOC, EOL wishes, disposition and options. Patient is asleep and alert to self with some confusion. She is s/p day 1 of her procedure.   I re-introduced Palliative Medicine as specialized medical care for people living with serious illness. It focuses on providing relief from the symptoms and stress of a serious illness. The goal is to improve quality of life for both the patient and the family.  Patient has been seen by our  team in the past and currently has outpatient Palliative at [name] per daughter [name] and Palliative).   Daughter states patient is from [name] and has 2 daughters. She has 4 grandchildren also who she enjoys spending time with. Patient has been a resident at [name] for several years due to chronic illness.   Prior to admission, Mikayla [name] expresses patient was doing well and had minimal complaints. She was active at facility and enjoyed being there amongst friends. She required some assistance, but overall they were satisfied with her function and nutritional status. Daughter reports patient had a great appetite.   We discussed her current illness and what it means in the larger context of her on-going co-morbidities. We discussed specifically patients current illness and recovery process in relation to her overall functional status. Daughters verbalized they are remaining hopeful that patient will continue to show signs of improvement and not have to undergo dialysis. Patient has expressed to daughters  that she was not wishing to have to cross that path and did not think she would want to undergo dialysis, however, she preferred to not talk about it and only focus on getting better. If the time came to have to make a decision she would then make a firm decision  whether or not to start HD based on the medical teams updates and need for temporary versus long-term.   I attempted to elicit values and goals of care important to the patient.    The difference between aggressive medical intervention and comfort care was considered in light of the patient's goals of care. Daughters are familiar with comfort measures as patient was critically ill in 2017 and at one point family was unsure of the outcome and considering comfort at that time. As of now they would like to continue to treat the treatable.   Patient does have a documented advance directive and her daughter Mikayla [name] is her HCPOA. Family also confirms wishes for DNR/DNI and no artificial feedings such as PEG tube.   Daughter verbalizes patient is actively under the care of Palliative with [name]. She is grateful for their services and express the plan is to continue with their support once she returns back to [name].   Questions and concerns were addressed. The family was encouraged to call with questions or concerns.  PMT will continue to support holistically.  Primary Decision Maker:  HCPOA/DAUGHTER: Mikayla [name]    SUMMARY OF RECOMMENDATIONS    DNR/DNI-as confirmed by daughter  Continue to treat the treatable. Daughters remain hopeful patient will continue to show signs of improvement and not require interventions such as dialysis. Patient has voiced she is not wishing to have dialysis, but would rather take it day by day and seriously discuss further if this because a decision that has to be made.   Currently under outpatient Palliative services with [name] at [name] Will continue with Palliative at discharge.   Palliative Medicine team will continue to support and follow as needed.   Code Status/Advance Care Planning:  DNR   Symptom Management:   Per attending   Palliative Prophylaxis:   Aspiration, Bowel Regimen, Delirium Protocol, Frequent Pain Assessment and Oral Care  Additional Recommendations (Limitations, Scope, Preferences):  Full Scope Treatment-treat the treatable  Psycho-social/Spiritual:   Desire for further Chaplaincy support:NO   Prognosis:   Guarded   Discharge Planning: Per family back to [name] with outpatient Palliative       Primary Diagnoses: Present on Admission: . Severe sepsis (HCC) . HTN (hypertension) . PAF (paroxysmal atrial fibrillation) (HCC) . Renal hemorrhage, right   I have reviewed the medical record, interviewed the patient and family, and examined the patient. The following aspects are  pertinent.  Past Medical History:  Diagnosis Date  . Atrial fibrillation (HCC)   . Empyema [name])    in [facility] Aug 2017- stayed in hospital on for 2 months.  . Hypertension    Social History   Socioeconomic History  . Marital status: Widowed    Spouse name: Not on file  . Number of children: Not on file  . Years of education: Not on file  . Highest education level: Not on file  Occupational History  . Not on file  Social Needs  . Financial resource strain: Not on file  . Food insecurity:    Worry: Not on file    Inability: Not on file  . Transportation needs:    Medical: Not on file    Non-medical: Not on file  Tobacco Use  . Smoking status: Former Smoker    Types: Cigarettes  . Smokeless tobacco: Never Used  . Tobacco comment: quit in 2001. smoked for 30 years  Substance and Sexual Activity  . Alcohol use: No  .  Drug use: No  . Sexual activity: Never  Lifestyle  . Physical activity:    Days per week: Not on file    Minutes per session: Not on file  . Stress: Not on file  Relationships  . Social connections:    Talks on phone: Not on file    Gets together: Not on file    Attends religious service: Not on file    Active member of club or organization: Not on file    Attends meetings of clubs or organizations: Not on file    Relationship status: Not on file  Other Topics Concern  . Not on file  Social History Narrative  . Not on file   Family History  Problem Relation Age of Onset  . Hypertension Mother   . COPD Mother   . Lung cancer Father   . Liver cancer Sister    Scheduled Meds: . sodium chloride   Intravenous Once  . carbidopa-levodopa  1 tablet Oral TID  . insulin aspart  0-20 Units Subcutaneous Q4H  . mouth rinse  15 mL Mouth Rinse BID  . pantoprazole  40 mg Oral BID AC  . sodium chloride flush  10-40 mL Intracatheter Q12H   Continuous Infusions: . sodium chloride Stopped (11/04/18 0426)  . meropenem (MERREM) IV 500 mg (11/04/18 0910)   PRN  Meds:.acetaminophen **OR** acetaminophen, HYDROmorphone (DILAUDID) injection, ipratropium-albuterol, ondansetron **OR** ondansetron (ZOFRAN) IV, sodium chloride flush Medications Prior to Admission:  Prior to Admission medications   Medication Sig Start Date End Date Taking? Authorizing Provider  acetaminophen (TYLENOL) 325 MG tablet Take 650 mg by mouth every 4 (four) hours as needed for mild pain or fever.   Yes [provider]  amiodarone (PACERONE) 200 MG tablet Take 1 tablet (200 mg total) by mouth daily. 11/22/16  Yes Shaune Pollack, MD  carbidopa-levodopa (SINEMET IR) 10-100 MG tablet Take 1 tablet by mouth 3 (three) times daily. 08/25/18  Yes Sudini, Wardell Heath, MD  ferrous sulfate 324 (65 Fe) MG TBEC Take 1 tablet by mouth daily.   Yes [provider]  furosemide (LASIX) 20 MG tablet Take 1 tablet (20 mg total) by mouth daily. 10/30/16  Yes Sudini, Wardell Heath, MD  gabapentin (NEURONTIN) 300 MG capsule Take 300 mg by mouth at bedtime.   Yes [provider]  guaiFENesin (ROBITUSSIN) 100 MG/5ML SOLN Take 10 mLs by mouth every 6 (six) hours as needed for cough or to loosen phlegm. FOR 10 DAYS 11/01/18 11/07/18 Yes [provider]  ipratropium-albuterol (DUONEB) 0.5-2.5 (3) MG/3ML SOLN Take 3 mLs by nebulization 3 (three) times daily.   Yes [provider]  Melatonin 5 MG TABS Take 5 mg by mouth at bedtime.   Yes [provider]  Multiple Vitamins-Minerals (CENTROVITE) TABS Take 1 tablet by mouth daily.   Yes [provider]  nystatin (NYSTATIN) powder Apply topically 2 (two) times daily as needed. APPLY UNDER NECK TWICE DAILY AS NEEDED FOR YEAST   Yes [provider]  omeprazole (PRILOSEC OTC) 20 MG tablet Take 20 mg by mouth 2 (two) times daily.   Yes [provider]  ondansetron (ZOFRAN-ODT) 4 MG disintegrating tablet Take 4 mg by mouth every 6 (six) hours as needed for nausea or vomiting. FOR 3 DAYS 11/01/18 11/04/18 Yes [provider]  oxymetazoline (AFRIN) 0.05 % nasal spray Place 2 sprays into both nostrils 2 (two) times daily. For 3 days 11/02/18 11/04/18 Yes [provider]  PARoxetine (PAXIL) 30 MG tablet  Take 30 mg by mouth daily.    Yes [provider]  potassium chloride SA (K-DUR,KLOR-CON) 20 MEQ tablet Take 20 mEq by mouth daily.   Yes [provider]  senna (SENOKOT) 8.6 MG TABS tablet Take 1 tablet by mouth daily.   Yes [provider]  traZODone (DESYREL) 50 MG tablet Take 25 mg by mouth at bedtime.   Yes [provider]  warfarin (COUMADIN) 5 MG tablet Take 5 mg by mouth daily at 8 pm.   Yes [provider]  carvedilol (COREG) 3.125 MG tablet Take 1 tablet (3.125 mg total) by mouth 2 (two) times daily with a meal. Patient not taking: Reported on 08/19/2018 08/12/18   Altamese DillingVachhani, Vaibhavkumar, MD  Multiple Vitamin (MULTIVITAMIN WITH MINERALS) TABS tablet Take 1 tablet by mouth daily.    [provider]   Allergies  Allergen Reactions  . Levaquin [Levofloxacin]   . Other Nausea And Vomiting  . Sulfa Antibiotics Hives  . Oxycodone Anxiety and Other (See Comments)   Review of Systems  Unable to perform ROS: Acuity of condition    Physical Exam Vitals signs and nursing note reviewed.  Constitutional:      Appearance: She is well-developed.  Cardiovascular:     Rate and Rhythm: Normal rate and regular rhythm.     Pulses: Normal pulses.     Heart sounds: Normal heart sounds.  Pulmonary:     Effort: Pulmonary effort is normal.     Breath sounds: Decreased breath sounds and wheezing present.     Comments: 3l/  Abdominal:     General: Bowel sounds are normal.  Skin:    General: Skin is warm and dry.     Findings: Bruising present.     Comments: Groin dressing   Neurological:     Mental Status: She is alert and easily aroused. She is confused.     Motor: Weakness present.     Comments: Alert to self  Psychiatric:         Cognition and Memory: Cognition is impaired.        Judgment: Judgment is inappropriate.     Vital Signs: BP (!) 141/59 (BP Location: Right Wrist)   Pulse 83   Temp 98.1 F (36.7 C)   Resp 20   Ht 5\' 4"  (1.626 m)   Wt 109.3 kg   SpO2 98%   BMI 41.36 kg/m  Pain Scale: 0-10 POSS *See Group Information*: 2-Acceptable,Slightly drowsy, easily aroused Pain Score: 0-No pain   SpO2: SpO2: 98 % O2 Device:SpO2: 98 % O2 Flow Rate: .O2 Flow Rate (L/min): 3 L/min  IO: Intake/output summary:   Intake/Output Summary (Last 24 hours) at 11/04/2018 1614 Last data filed at 11/04/2018 1400 Gross per 24 hour  Intake 1609.76 ml  Output 430 ml  Net 1179.76 ml    LBM:   Baseline Weight: Weight: 109.3 kg Most recent weight: Weight: 109.3 kg     Palliative Assessment/Data: PPS 30%   Flowsheet Rows     Most Recent Value  Intake Tab  Unit at Time of Referral  ICU  Palliative Care Primary Diagnosis  Sepsis/Infectious Disease  Date Notified  11/03/18  Palliative Care Type  Return patient Palliative Care  Reason for referral  Clarify Goals of Care  Date of Admission  11/02/18  # of days IP prior to Palliative referral  1  Clinical Assessment  Psychosocial & Spiritual Assessment  Palliative Care Outcomes      Time In: 1435  Time Out: 1525 Time Total: 50 min.   Greater than 50%  of this time was spent counseling and coordinating care related to the above assessment and plan.  Signed by:  Willette AlmaNikki Pickenpack-Cousar, AGPCNP-BC Palliative Medicine Team  Phone: [phone] Fax: [phone] Pager: [phone] Amion: Thea AlkenN. Cousar    Please contact Palliative Medicine Team phone at [phone] for questions and concerns.  For individual provider: See Loretha StaplerAmion

## 2018-11-04 NOTE — Progress Notes (Signed)
Patient alert with confusion. On 3 liters of oxygen. No complaints of pain. Pure-wick in tact. Dr Thedore Mins rounded on patient this am. No new orders. Will evaluate tomorrow in the am. Hemoglobin q6. Daughter called and informed that patient moving to room 159. Right groin intact with no bleeding or hematoma.

## 2018-11-04 NOTE — Progress Notes (Signed)
eLink Physician-Brief Progress Note Patient Name: Mikayla [name] DOB: 1941-01-22 MRN: [id number]   Date of Service  11/04/2018  HPI/Events of Note  Pt receiving blood transfusion and Tmax noted 101.2.  Temp prior to transfusion was 99.64F.  No other rashes, pt is hemodynamically stable.   eICU Interventions  Give acetaminophen. Once fever defervesces, can restart blood transfusion.       Intervention Category Intermediate Interventions: Other:  Larinda Buttery 11/04/2018, 3:58 AM

## 2018-11-04 NOTE — Progress Notes (Signed)
[facility]  Daily Progress Note   Subjective: 1 Day Post-Op: Ultrasound guidance for vascular access right femoral artery, Catheter placement into right renal artery branches from right femoral approach, Aortogram and selective right renal angiogram, Coil embolization of the right main renal artery and most inferior branch using a total of 4 Ruby coils, Right femoral venous triple-lumen catheter placement with ultrasound and fluoroscopic guidance with StarClose closure device right femoral artery  Patient denies any flank pain this afternoon. No issues overnight.   Objective: Vitals:   11/04/18 1000 11/04/18 1100 11/04/18 1200 11/04/18 1300  BP: (!) 142/51 (!) 146/59 (!) 129/54 107/69  Pulse: 80 78 78 87  Resp: (!) 27 (!) 30 (!) 25 (!) 25  Temp:      TempSrc:      SpO2: 98% 99% 99% 96%  Weight:      Height:        Intake/Output Summary (Last 24 hours) at 11/04/2018 1426 Last data filed at 11/04/2018 3276 Gross per 24 hour  Intake 2425.42 ml  Output 230 ml  Net 2195.42 ml   Physical Exam: NAD CV: RRR Pulmonary: CTA Bilaterally Abdomen: Soft, Nontender, Nondistended, (+) Bowel Sounds Right groin: Triple lumen intact, no swelling or drainage noted. GU: Foley in place   Laboratory: CBC    Component Value Date/Time   WBC 29.1 (H) 11/04/2018 0855   HGB 7.5 (L) 11/04/2018 0855   HCT 23.4 (L) 11/04/2018 0855   PLT 246 11/04/2018 0855   BMET    Component Value Date/Time   NA 135 11/04/2018 0855   K 4.4 11/04/2018 0855   CL 101 11/04/2018 0855   CO2 26 11/04/2018 0855   GLUCOSE 111 (H) 11/04/2018 0855   BUN 39 (H) 11/04/2018 0855   CREATININE 2.98 (H) 11/04/2018 0855   CALCIUM 7.9 (L) 11/04/2018 0855   GFRNONAA 14 (L) 11/04/2018 0855   GFRAA 17 (L) 11/04/2018 0855   Assessment/Planning: The patient is a 78 year old female with s/p right renal artery embolization for a hemorrhaging kidney - POD #1 1) Patient without any flank pain today 2)  Creatinine relatively stable today  3) Would expect an increase in both in the upcoming days 4) Care as per primary team  Discussed with Dr. Wallis Mart Stegmayer PA-C 11/04/2018 2:26 PM

## 2018-11-04 NOTE — Progress Notes (Signed)
Please note patient is currently followed by outpatient Palliative at [facility]. CSW York Spaniel and hospital Palliative team made aware. Dayna Barker RN, BSN, [facility] [place], hospital liaison [phone]

## 2018-11-04 NOTE — Evaluation (Signed)
Clinical/Bedside Swallow Evaluation Patient Details  Name: Mikayla [name] MRN: [id number] Date of Birth: 05-01-1941  Today's Date: 11/04/2018 Time: SLP Start Time (ACUTE ONLY): 1303 SLP Stop Time (ACUTE ONLY): 1403 SLP Time Calculation (min) (ACUTE ONLY): 60 min  Past Medical History:  Past Medical History:  Diagnosis Date  . Atrial fibrillation (HCC)   . Empyema [facility])    in [facility] Aug 2017- stayed in hospital on for 2 months.  . Hypertension    Past Surgical History:  Past Surgical History:  Procedure Laterality Date  . EMBOLIZATION N/A 11/03/2018   Procedure: EMBOLIZATION;  Surgeon: Annice Needy, MD;  Location: [facility] INVASIVE CV LAB;  Service: Cardiovascular;  Laterality: N/A;  . ERCP N/A 08/23/2018   Procedure: ENDOSCOPIC RETROGRADE CHOLANGIOPANCREATOGRAPHY (ERCP);  Surgeon: Midge Minium, MD;  Location: [facility] ENDOSCOPY;  Service: Endoscopy;  Laterality: N/A;  . PEG TUBE PLACEMENT     HPI:  Pt is a 78 y.o. female with a known history of Obesity, Empyema, hypertension, atrial fibrillation, she was admitted in [facility] from August 2017 until October 2017- for empyema. Her hospital stay was complicated and she had to stay on ventilator for 15 days with chest tubes for drainage and she was on broad-spectrum antibiotic and also finally had trach and PEG. Since October she is discharged to [facility] and she is there until now. She is given an oral diet and medications at nursing home after passing swallowing tests w/ Speech Therapy there and at [facility](per daughters).  This admission, pt presented w/ several days of N/V, R quadrent pain w/ presumed sepsis, acute renal injury and multiorgan dysfunction found to have a large right perinephric/parenchymal hematoma measuring 9 x 11 x 16 cm extending into the retroperitoneum the setting of anticoagulation INR of 3.0 (no clear underlying renal mass). Vascular and Urology intervention during this admission.    Assessment / Plan /  Recommendation Clinical Impression  Pt appeared to present w/a fairly adequate oropharyngeal phase swallowing function w/ no immediate, overt oropharyngeal phase dysphagia apparent during po trials. Pt exhibited the deep, congested coughing intermittently x2-3 as at baseline PRIOR to po's; this did not appear in direct relation to any po's consumed. Pt required full assistance in positioning more upright in bed; positioning appears a challenge. She was min drowsy but alerted and responded easily to verbal cues from SLP and Dtr. Pt helped to feed self by holding cup. She consumed po trials of ice chips, thin liquids, purees, and soft solids w/ no immediate, overt coughing or other s/s of aspiration; O2 sats remained 98%; RR in low 20s. Vocal quality clear b/t trials. Noted pt's respiratory effort was slightly increased w/ the exertion but w/ brief rest breaks b/t trials, it remained calm and unlabored. Oral phase appeared  grossly [facility] for bolus management and oral clearing overall. Timely mastication effort and A-P transfer w/ all trials. Pt exhibited min oral confusion initially w/ solid trials w/ regard to mastication, and rest breaks were given b/t trials to avoid any SOB/WOB d/t the exertion. No unilateral weakness during OM exam. Recommend a dysphagia level 3 (Mech Soft) diet w/ thin liquids w/ general aspiration precautions; Pills given in puree for easier swallowing at this time d/t bedbound status and illness - this may be beneficial at discharge as well d/t bedbound status at [facility]. Discussed aspiration precautions w/ pt and Dtr; both endorsed importance of using precautions and the need to sit upright in bed when eating/drinking. No further skilled  ST services indicated at this time as pt appears at/close to her baseline. NSG to reconsult if any decline in status while admitted; or any education needs. Pt agreed. NSG updated.  SLP Visit Diagnosis: Dysphagia, oropharyngeal phase (R13.12)(impacted by  respiratory status; mental status)    Aspiration Risk  Mild aspiration risk(but reduced following precautions)    Diet Recommendation  Dysphagia level 3 (Mech Soft foods, moistened) w/ thin liquids; general aspiration precautions. Feeding support - pt MUST sit upright w/ head forward for any oral intake  Medication Administration: Whole meds with puree(for safer swallowing overall d/t bedbound status)    Other  Recommendations Recommended Consults: (Dietician f/u) Oral Care Recommendations: Oral care BID;Staff/trained caregiver to provide oral care Other Recommendations: (n/a)   Follow up Recommendations None      Frequency and Duration (n/a)  (n/a)       Prognosis Prognosis for Safe Diet Advancement: Good      Swallow Study   General Date of Onset: 11/02/18 HPI: Pt is a 78 y.o. female with a known history of Obesity, Empyema, hypertension, atrial fibrillation, she was admitted in [facility] from August 2017 until October 2017- for empyema. Her hospital stay was complicated and she had to stay on ventilator for 15 days with chest tubes for drainage and she was on broad-spectrum antibiotic and also finally had trach and PEG. Since October [facility] is [facility] and she is there until now. She is given an oral diet and medications at nursing home after passing swallowing tests w/ Speech Therapy there and at [facility](per daughters).  This admission, pt presented w/ several days of N/V, R quadrent pain w/ presumed sepsis, acute renal injury and multiorgan dysfunction found to have a large right perinephric/parenchymal hematoma measuring 9 x 11 x 16 cm extending into the retroperitoneum the setting of anticoagulation INR of 3.0 (no clear underlying renal mass). Vascular and Urology intervention during this admission.  Type of Study: Bedside Swallow Evaluation Previous Swallow Assessment: 09/29/2016 Diet Prior to this Study: Dysphagia 3 (soft);Thin liquids(per Dtr  report) Temperature Spikes Noted: No(wbc 29.1 down from 41) Respiratory Status: Nasal cannula(3 liters) History of Recent Intubation: No Behavior/Cognition: Alert;Cooperative;Pleasant mood(Drowsy but attentive) Oral Cavity Assessment: Dry Oral Care Completed by SLP: Yes Oral Cavity - Dentition: Adequate natural dentition Vision: Functional for self-feeding Self-Feeding Abilities: Able to feed self;Needs assist;Needs set up Patient Positioning: Upright in bed(needed positioning) Baseline Vocal Quality: Low vocal intensity Volitional Cough: Congested;Strong Volitional Swallow: Able to elicit    Oral/Motor/Sensory Function Overall Oral Motor/Sensory Function: Within functional limits   Ice Chips Ice chips: Within functional limits Presentation: Spoon(3 trials) Other Comments: masticated ice chips   Thin Liquid Thin Liquid: Within functional limits Presentation: Cup;Self Fed;Straw(fully assisted; 5 sips via each then multiple sips)    Nectar Thick Nectar Thick Liquid: Not tested   Honey Thick Honey Thick Liquid: Not tested   Puree Puree: Within functional limits Presentation: Spoon(fed; 10 trials)   Solid     Solid: Impaired Presentation: Spoon(fed; 4 trials) Oral Phase Impairments: Poor awareness of bolus(min confusion ) Oral Phase Functional Implications: Prolonged oral transit;Impaired mastication(min) Pharyngeal Phase Impairments: (none)       Jerilynn SomKatherine Watson, MS, CCC-SLP Watson,Katherine 11/04/2018,4:43 PM

## 2018-11-04 NOTE — Progress Notes (Signed)
[facility] - [place] at [facility]   PATIENT NAME: Mikayla [name]    MR#:  [id number]  DATE OF BIRTH:  August 03, 1941  SUBJECTIVE:  CHIEF COMPLAINT:   Chief Complaint  Patient presents with  . Emesis  . Abdominal Pain   The patient has cough but no shortness of breath, oxygen by nasal cannula 3 L.  No active bleeding. REVIEW OF SYSTEMS:  Review of Systems  Constitutional: Negative for chills and fever.  HENT: Negative for hearing loss and tinnitus.   Eyes: Negative for blurred vision and double vision.  Respiratory: Negative for cough and hemoptysis.   Cardiovascular: Negative for chest pain and palpitations.  Gastrointestinal: Negative for abdominal pain, heartburn, nausea and vomiting.  Genitourinary: Negative for dysuria and urgency.  Musculoskeletal: Negative for myalgias and neck pain.  Skin: Negative for itching and rash.  Neurological: Negative for dizziness and headaches.  Psychiatric/Behavioral: Negative for depression and hallucinations.    DRUG ALLERGIES:   Allergies  Allergen Reactions  . Levaquin [Levofloxacin]   . Other Nausea And Vomiting  . Sulfa Antibiotics Hives  . Oxycodone Anxiety and Other (See Comments)   VITALS:  Blood pressure (!) 142/45, pulse 85, temperature 99.3 F (37.4 C), resp. rate 18, height 5\' 4"  (1.626 m), weight 109.3 kg, SpO2 100 %. PHYSICAL EXAMINATION:   Physical Exam  Constitutional: She is oriented to person, place, and time and well-developed, well-nourished, and in no distress. No distress.  Morbidly obese  HENT:  Head: Normocephalic and atraumatic.  Eyes: Pupils are equal, round, and reactive to light. Conjunctivae and EOM are normal. No scleral icterus.  Neck: Normal range of motion. No JVD present. No tracheal deviation present. No thyromegaly present.  Cardiovascular: Normal rate, regular rhythm and normal heart sounds.  Pulmonary/Chest: Effort normal. No stridor. She has wheezes.  Abdominal: Soft.  Bowel sounds are normal. She exhibits no distension. There is no abdominal tenderness.  Musculoskeletal:        General: No tenderness or edema.  Neurological: She is alert and oriented to person, place, and time.  Skin: Skin is warm. She is not diaphoretic.  Psychiatric: Affect normal.   LABORATORY PANEL:  Female CBC Recent Labs  Lab 11/04/18 0855 11/04/18 1125  WBC 29.1*  --   HGB 7.5* 7.2*  HCT 23.4* 22.7*  PLT 246  --    ------------------------------------------------------------------------------------------------------------------ Chemistries  Recent Labs  Lab 11/02/18 1905  11/04/18 0855  NA 135   < > 135  K 4.8   < > 4.4  CL 93*   < > 101  CO2 23   < > 26  GLUCOSE 241*   < > 111*  BUN 30*   < > 39*  CREATININE 3.03*   < > 2.98*  CALCIUM 8.6*   < > 7.9*  AST 212*  --   --   ALT 83*  --   --   ALKPHOS 83  --   --   BILITOT 0.6  --   --    < > = values in this interval not displayed.   RADIOLOGY:  No results found. ASSESSMENT AND PLAN:     1.  Severe sepsis, possible due to UTI.  Continue IV fluid hydration. Continue empiric antibiotics with meropenem, follow-up urine culture.  2.  Right-sided perinephric hematoma Secondary to Coumadin therapy which was reversed with with vitamin K reversal protocol. Since the ptient not a surgical candidate for nephrectomy, she got endovascular embolization of the right  kidney.  3.  Acute kidney injury, Likely due to hemorrhage and sepsis. Continue IV fluid hydration.  Nephrology service following patient.  Follow-up BMP.  4. HTN (hypertension) -hold all antihypertensives due to low blood pressure on presentation. Gradually resume as blood pressure begins to trend up  5.  PAF (paroxysmal atrial fibrillation) (HCC) - Coumadin placed on hold due to renal hemorrhage.   Rate controlled  DVT prophylaxis; SCDs for now No Coumadin or heparin products due to retinal bleed.  Anemia of chronic disease and acute blood loss  due to Coumadin and renal hematoma.  Hemoglobin down to 7.2. PRBC transfusion PRN, follow-up CBC.  Leukocytosis.  Possible due to reaction to renal hematoma and UTI. Follow-up CBC.  Chronic respiratory failure, continue home oxygen 3 L.  NEB as needed.  Morbid obesity diet control and follow-up PCP.  I discussed with Dr. Lorriane Shire.  All the records are reviewed and case discussed with Care Management/Social Worker. Management plans discussed with the patient, 2 daughters and they are in agreement.  CODE STATUS: DNR  TOTAL TIME TAKING CARE OF THIS PATIENT: 38 minutes.   More than 50% of the time was spent in counseling/coordination of care: YES  POSSIBLE D/C IN 2-3 DAYS, DEPENDING ON CLINICAL CONDITION.   Shaune Pollack M.D on 11/04/2018 at 4:00 PM  Between 7am to 6pm - Pager - [phone]  After 6pm go to [email or web address] - Social research officer, government  [facility] [place] [facility]  Office  [phone]  CC: Primary care physician; System, Pcp Not In  Note: This dictation was prepared with Dragon dictation along with smaller phrase technology. Any transcriptional errors that result from this process are unintentional.

## 2018-11-04 NOTE — Discharge Instructions (Signed)
[facility]

## 2018-11-04 NOTE — Progress Notes (Signed)
[facility] [place], [place] 11/04/18  Subjective:   Doing fair today Underwent coil embolization of the bleeding from right kidney Denies any acute pain. Fevers noted Foley was attempted but could not be placed UOP measurement is not accurate.  Objective:  Vital signs in last 24 hours:  Temp:  [98.5 F (36.9 C)-101.4 F (38.6 C)] 98.9 F (37.2 C) (01/10 0700) Pulse Rate:  [78-100] 88 (01/10 0900) Resp:  [15-36] 23 (01/10 0900) BP: (98-160)/(45-85) 148/66 (01/10 0900) SpO2:  [96 %-100 %] 96 % (01/10 0900)  Weight change:  Filed Weights   11/02/18 2346 11/02/18 2351  Weight: 109.3 kg 109.3 kg    Intake/Output:    Intake/Output Summary (Last 24 hours) at 11/04/2018 1048 Last data filed at 11/04/2018 [id number] Gross per 24 hour  Intake 2425.42 ml  Output 235 ml  Net 2190.42 ml   Physical Exam: General:  Obese, elderly frail, laying in the bed  HEENT  mouth is dry, decreased hearing  Neck: Supple, no masses  Lungs:  Coarse breath sounds bilaterally, nasal cannula oxygen  Heart::  No rub  Abdomen:  Soft, nontender  Extremities:  No edema  Neurologic:  Alert, able to answer simple questions appropriately, left hand tremor  Skin:  No acute rashes  Foley:  purewick system      Basic Metabolic Panel:  Recent Labs  Lab 11/02/18 1905 11/03/18 0410 11/03/18 1500 11/04/18 0855  NA 135 136 137 135  K 4.8 5.0 5.0 4.4  CL 93* 100 100 101  CO2 23 25 27 26   GLUCOSE 241* 162* 132* 111*  BUN 30* 35* 36* 39*  CREATININE 3.03* 3.10* 3.12* 2.98*  CALCIUM 8.6* 7.7* 7.8* 7.9*     CBC: Recent Labs  Lab 11/02/18 1856 11/03/18 0039 11/03/18 0410 11/03/18 1358 11/03/18 1948 11/03/18 2314 11/04/18 0855  WBC 35.9*  --  41.4*  --   --   --  29.1*  NEUTROABS 30.5*  --   --   --   --   --   --   HGB 8.8* 7.5* 7.0* 7.6* 6.7* 6.6* 7.5*  HCT 30.6* 25.1* 22.6*  --  21.3* 21.1* 23.4*  MCV 94.7  --  91.5  --   --   --  89.3  PLT 447*  --  345  --   --   --   246     No results found for: HEPBSAG, HEPBSAB, HEPBIGM    Microbiology:  Recent Results (from the past 240 hour(s))  Culture, blood (Routine x 2)     Status: None (Preliminary result)   Collection Time: 11/02/18  7:06 PM  Result Value Ref Range Status   Specimen Description BLOOD LEFT ANTECUBITAL  Final   Special Requests   Final    BOTTLES DRAWN AEROBIC AND ANAEROBIC Blood Culture adequate volume   Culture   Final    NO GROWTH 2 DAYS Performed at [facility], [address] [place], [place] [address]    Report Status PENDING  Incomplete  MRSA PCR Screening     Status: None   Collection Time: 11/02/18 11:43 PM  Result Value Ref Range Status   MRSA by PCR NEGATIVE NEGATIVE Final    Comment:        The GeneXpert MRSA Assay (FDA approved for NASAL specimens only), is one component of a comprehensive MRSA colonization surveillance program. It is not intended to diagnose MRSA infection nor to guide or monitor treatment for MRSA infections.  Performed at [facility], [address] [place], [place] [address]   Culture, blood (Routine x 2)     Status: None (Preliminary result)   Collection Time: 11/03/18 12:38 AM  Result Value Ref Range Status   Specimen Description BLOOD RIGHT ANTECUBITAL  Final   Special Requests   Final    BOTTLES DRAWN AEROBIC AND ANAEROBIC Blood Culture results may not be optimal due to an excessive volume of blood received in culture bottles   Culture   Final    NO GROWTH 1 DAY Performed at [facility], [address], [place], [place] [address]    Report Status PENDING  Incomplete    Coagulation Studies: Recent Labs    11/02/18 1856 11/03/18 0410 11/03/18 1358 11/03/18 2140 11/04/18 0855  LABPROT 31.2* 15.9* 15.7* 16.9* 16.9*  INR 3.06 1.28 1.26 1.39 1.39    Urinalysis: Recent Labs    11/02/18 1856  COLORURINE YELLOW*  LABSPEC 1.020  PHURINE 5.0  GLUCOSEU NEGATIVE  HGBUR MODERATE*  BILIRUBINUR  NEGATIVE  KETONESUR NEGATIVE  PROTEINUR 30*  NITRITE NEGATIVE  LEUKOCYTESUR TRACE*      Imaging: Ct Abdomen Pelvis Wo Contrast  Result Date: 11/02/2018 CLINICAL DATA:  Acute abdominal pain.  Fever.  On Coumadin. EXAM: CT ABDOMEN AND PELVIS WITHOUT CONTRAST TECHNIQUE: Multidetector CT imaging of the abdomen and pelvis was performed following the standard protocol without IV contrast. COMPARISON:  CT abdomen pelvis 08/21/2018 FINDINGS: Lower chest: Calcified granuloma left lower lobe. Multifocal nodular densities right lung base posteriorly have progressed. Probable mucous plugging. No effusion. Hepatobiliary: Small calcified gallstones. No biliary dilatation. No focal liver lesion. Pancreas: Pancreatic atrophy.  No mass or edema Spleen: Negative Adrenals/Urinary Tract: Hyperdense right renal hemorrhage has developed since the prior study. This involves the upper mid and lower pole extends into the perirenal space laterally. Hematoma is also present in the retroperitoneum posteriorly which appears to connect with the upper pole of the right renal hemorrhage. Posterior pararenal hematoma measures approximately 9 x 11 x 16 cm and also is hyperdense. No underlying renal lesion is seen on the recent CT. Patient is on Coumadin with INR of 3. Left kidney is atrophic without focal abnormality. Bladder is empty. Stomach/Bowel: Negative for bowel obstruction. No bowel mass or edema. Vascular/Lymphatic: Atherosclerotic aorta without aneurysm. No lymphadenopathy. Reproductive: Normal uterus.  No pelvic mass Other: Small amount of free fluid in the pelvis. 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 Musculoskeletal: Diffuse lumbar degenerative changes. No acute skeletal abnormality. IMPRESSION: Acute hemorrhage in the right kidney. Hemorrhage has ruptured into the posterior pararenal space where there is a large acute hematoma. No underlying renal mass is seen on recent CT of 08/21/2018. Hemorrhage is likely due to  coagulopathy. These results were called by telephone at the time of interpretation on 11/02/2018 at 8:45 pm to Dr. Dionne Bucy , who verbally acknowledged these results. Electronically Signed   By: Marlan Palau M.D.   On: 11/02/2018 20:45   US Renal  Result Date: 11/03/2018 CLINICAL DATA:  Acute renal failure EXAM: RENAL / URINARY TRACT ULTRASOUND COMPLETE COMPARISON:  CT abdomen pelvis of 11/02/2018 FINDINGS: Right Kidney: Renal measurements: 11.5 x 5.0 x 6.3 cm. = volume: 188 mL. No hydronephrosis is seen. However there is a hypoechoic structure along the upper pole of the right kidney measuring 7.9 x 4.9 x 9.1 cm. This is consistent with the pararenal hematoma noted on recent CT. Left Kidney: Renal measurements: 11.4 x 5.0 x 4.8 cm. =  volume: 142 mL. No hydronephrosis is seen. The echogenicity of the renal parenchyma is within normal limits. Bladder: The urinary bladder is moderately distended with no abnormality noted. IMPRESSION: 1. No hydronephrosis. 2. Apparent right pararenal hematoma of 7.9 x 4.9 x 9.1 cm, when compared to recent CT of the abdomen pelvis. Electronically Signed   By: Dwyane Dee M.D.   On: 11/03/2018 15:59   Dg Chest Portable 1 View  Result Date: 11/02/2018 CLINICAL DATA:  Fever and hypotension EXAM: PORTABLE CHEST 1 VIEW COMPARISON:  11/02/2018 CT AP FINDINGS: Lung volumes are low. The patient is slightly rotated on this exam. Left atrial enlargement is identified. Aortic atherosclerosis without aneurysm. Mild vascular congestion without acute pulmonary consolidation. Nodule at the left lung base consistent with a granuloma based on same day CT. Atelectasis is noted at the right lung base. Remote posttraumatic deformity of the right fifth rib. IMPRESSION: 1. Low lung volumes with atelectasis at the right lung base. Mild vascular congestion. 2. Dense nodule at the left lung base consistent with a granuloma as seen on same day CT. Electronically Signed   By: Tollie Eth M.D.    On: 11/02/2018 22:09   US Abdomen Limited Ruq  Result Date: 11/02/2018 CLINICAL DATA:  78 y/o  F; evaluation of cholecystitis. EXAM: ULTRASOUND ABDOMEN LIMITED RIGHT UPPER QUADRANT COMPARISON:  11/02/2018 CT abdomen and pelvis. FINDINGS: Gallbladder: Cholelithiasis. Collapsed gallbladder. Gallbladder wall measures 2.5 mm in thickness. No pericholecystic fluid. Common bile duct: Diameter: 2.5 mm Liver: Increased liver echogenicity. No focal liver lesion identified. Trace perihepatic fluid. Portal vein is patent on color Doppler imaging with normal direction of blood flow towards the liver. Other: Partially visualized collection within the right renal fossa. IMPRESSION: 1. Cholelithiasis.  No secondary signs of acute cholecystitis. 2. Echogenic liver compatible with steatosis. 3. Partially visualized right retroperitoneal collection corresponding to hematoma on CT. Electronically Signed   By: Mitzi Hansen M.D.   On: 11/02/2018 21:07     Medications:   . sodium chloride Stopped (11/04/18 0426)  . meropenem (MERREM) IV 500 mg (11/04/18 0910)  . norepinephrine (LEVOPHED) Adult infusion Stopped (11/03/18 0813)   . sodium chloride   Intravenous Once  . carbidopa-levodopa  1 tablet Oral TID  . insulin aspart  0-20 Units Subcutaneous Q4H  . mouth rinse  15 mL Mouth Rinse BID  . pantoprazole (PROTONIX) IV  40 mg Intravenous Q12H  . sodium chloride flush  10-40 mL Intracatheter Q12H   acetaminophen **OR** acetaminophen, HYDROmorphone (DILAUDID) injection, ipratropium-albuterol, ondansetron **OR** ondansetron (ZOFRAN) IV, sodium chloride flush  Assessment/ Plan:  Pt is a 78 y.o. caucasian  female with atrial fibrillation, hypertension, Parkinson's, was admitted on 11/02/2018 with atrial fibrillation, hypertension, bedbound status, who was admitted to [facility] on 11/02/2018 for evaluation of Abdominal pain and is found to have acute right renal hemorrhage with large acute hematoma, fever, hypotension,  sepsis, AKI  1.  Acute kidney injury Baseline creatinine 0.76 from August 24, 2018 Acute kidney injury is likely multifactorial from hypotension, hemorrhage, volume loss, possible sepsis  Discussed with patient's daughters that there is a small chance patient might need dialysis during this admission.  It will be most likely short-term for AKI but there is possibility of chronic dialysis.  Volume status and electrolytes are acceptable at present.  No acute indication for HD at present. UOP not accurate.   2.  Hyperglycemia Elevated blood sugars are noted, hemoglobin A1c 5.5%  3. Sepsis, hypotension, lactic acidosis Volume resuscitation, broad-spectrum antibiotics  as per ICU team Source unclear  4. Rt perinephric hemorrhage S/p coil embolization by Dr Wyn Quaker 11/03/2018   LOS: 2 Carlei Huang Thedore Mins 1/10/202010:48 AM  [facility] [place], [place] [phone]  Note: This note was prepared with Dragon dictation. Any transcription errors are unintentional

## 2018-11-04 NOTE — Progress Notes (Signed)
Patient noted to have minimal urine output; bladder scanner shows 129 ml. Attempted to insert a 14 fr. and 12 fr. urethral catheter but was unsuccessful. Throughout procedure patient was coughing and urine was noted. Suction canister showed output of 150 ml.

## 2018-11-04 NOTE — Progress Notes (Signed)
MEDICATION RELATED CONSULT NOTE - INITIAL   Pharmacy Consult for K-Centra  Indication:   Peri-nephric hematoma ,  Warfarin reversal   Allergies  Allergen Reactions  . Levaquin [Levofloxacin]   . Other Nausea And Vomiting  . Sulfa Antibiotics Hives  . Oxycodone Anxiety and Other (See Comments)    Patient Measurements: Height: 5\' 4"  (162.6 cm) Weight: 240 lb 15.4 oz (109.3 kg) IBW/kg (Calculated) : 54.7 Adjusted Body Weight:   Vital Signs: Temp: 98.9 F (37.2 C) (01/10 0700) Temp Source: Axillary (01/10 0632) BP: 107/69 (01/10 1300) Pulse Rate: 87 (01/10 1300) Intake/Output from previous day: 01/09 0701 - 01/10 0700 In: 2425.4 [I.V.:1925.4; Blood:300; IV Piggyback:200.1] Out: 235 [Urine:235] Intake/Output from this shift: No intake/output data recorded.  Labs: Recent Labs    11/02/18 1856  11/02/18 1905  11/03/18 0410  11/03/18 1500 11/03/18 1948 11/03/18 2314 11/04/18 0855  WBC 35.9*  --   --   --  41.4*  --   --   --   --  29.1*  HGB 8.8*  --   --    < > 7.0*   < >  --  6.7* 6.6* 7.5*  HCT 30.6*  --   --    < > 22.6*  --   --  21.3* 21.1* 23.4*  PLT 447*  --   --   --  345  --   --   --   --  246  CREATININE  --    < > 3.03*  --  3.10*  --  3.12*  --   --  2.98*  ALBUMIN  --   --  3.3*  --   --   --   --   --   --   --   PROT  --   --  7.0  --   --   --   --   --   --   --   AST  --   --  212*  --   --   --   --   --   --   --   ALT  --   --  83*  --   --   --   --   --   --   --   ALKPHOS  --   --  83  --   --   --   --   --   --   --   BILITOT  --   --  0.6  --   --   --   --   --   --   --   BILIDIR  --   --  0.1  --   --   --   --   --   --   --   IBILI  --   --  0.5  --   --   --   --   --   --   --    < > = values in this interval not displayed.   Estimated Creatinine Clearance: 18.8 mL/min (A) (by C-G formula based on SCr of 2.98 mg/dL (H)).   Medical History: Past Medical History:  Diagnosis Date  . Atrial fibrillation (HCC)   . Empyema [facility])     in [facility] Aug 2017- stayed in hospital on for 2 months.  . Hypertension       Assessment: Pharmacy consulted to dose and monitor KCentra in this 78 year  old female with peri - nephric hematoma, on warfarin PTA. 1/8:  INR @ 1900 = 3.06   TBW = 145.3 lbs (66kg)  1/8 Kcentra 1644 units IV x 1 dose ordered  1/9 @ 0410 INR: 1.28 1/9 @1358  INR: 1.26 1/9 @ 2140 INR : 139 1/10 @  0855 INR: 1.43   Goal of Therapy:  Reversal of anticoagulation   Plan:  Continue checking INR Q6H x 4 and then daily x2.  Last INR check 1/11 AM.    Gardner Candle, PharmD, BCPS Clinical Pharmacist 11/04/2018 1:50 PM

## 2018-11-04 NOTE — Progress Notes (Addendum)
Pt receiving blood transfusion and temperature of 101.4 noted 15 min post transfusion, otherwise VS stable. Blood stopped and NP Elvina Sidle notified.   Per Dr. Valora Piccolo administer acetaminophen, once fever decreases restart blood transfusion.   0420 Temp is now 99.2 axillary; blood restarted.

## 2018-11-04 NOTE — Treatment Plan (Signed)
Patient was discussed during multidisciplinary rounds.  She has remained hemodynamically stable after intervention to stop a renal bleed.  She is stable for transfer to the MedSurg floor.  I do not recommend anticoagulation in this patient due to high risk for morbidity and mortality.  The risks of anticoagulation outweigh the potential benefits in this patient who is normally bedridden and with multiple severe/end-stage comorbidities.  Patient to be transferred to MedSurg floor.

## 2018-11-05 LAB — CBC
HCT: 26 % — ABNORMAL LOW (ref 36.0–46.0)
Hemoglobin: 8.2 g/dL — ABNORMAL LOW (ref 12.0–15.0)
MCH: 28.4 pg (ref 26.0–34.0)
MCHC: 31.5 g/dL (ref 30.0–36.0)
MCV: 90 fL (ref 80.0–100.0)
Platelets: 233 10*3/uL (ref 150–400)
RBC: 2.89 MIL/uL — AB (ref 3.87–5.11)
RDW: 16.2 % — ABNORMAL HIGH (ref 11.5–15.5)
WBC: 24 10*3/uL — ABNORMAL HIGH (ref 4.0–10.5)
nRBC: 0.2 % (ref 0.0–0.2)

## 2018-11-05 LAB — BASIC METABOLIC PANEL
Anion gap: 7 (ref 5–15)
BUN: 34 mg/dL — AB (ref 8–23)
CO2: 26 mmol/L (ref 22–32)
Calcium: 8 mg/dL — ABNORMAL LOW (ref 8.9–10.3)
Chloride: 105 mmol/L (ref 98–111)
Creatinine, Ser: 2.33 mg/dL — ABNORMAL HIGH (ref 0.44–1.00)
GFR calc non Af Amer: 19 mL/min — ABNORMAL LOW (ref >60)
GFR, EST AFRICAN AMERICAN: 22 mL/min — AB (ref >60)
GLUCOSE: 94 mg/dL (ref 70–99)
Potassium: 3.9 mmol/L (ref 3.5–5.1)
Sodium: 138 mmol/L (ref 135–145)

## 2018-11-05 LAB — GLUCOSE, CAPILLARY
Glucose-Capillary: 71 mg/dL (ref 70–99)
Glucose-Capillary: 76 mg/dL (ref 70–99)
Glucose-Capillary: 78 mg/dL (ref 70–99)
Glucose-Capillary: 88 mg/dL (ref 70–99)
Glucose-Capillary: 90 mg/dL (ref 70–99)
Glucose-Capillary: 95 mg/dL (ref 70–99)
Glucose-Capillary: 95 mg/dL (ref 70–99)

## 2018-11-05 LAB — URINE CULTURE
Culture: <10000 — AB
Special Requests: NORMAL

## 2018-11-05 LAB — PROTIME-INR
INR: 1.24
Prothrombin Time: 15.5 seconds — ABNORMAL HIGH (ref 11.4–15.2)

## 2018-11-05 NOTE — Progress Notes (Signed)
[facility] [place], [place] 11/05/18  Subjective:   Doing fair today, eating protein ice cream Underwent coil embolization of the bleeding from right kidney this admission Denies any acute pain. Serum creatinine improved to 2.33 today  Objective:  Vital signs in last 24 hours:  Temp:  [98.1 F (36.7 C)-99.3 F (37.4 C)] 98.2 F (36.8 C) (01/11 0834) Pulse Rate:  [78-85] 80 (01/11 0834) Resp:  [18-22] 20 (01/11 0834) BP: (138-155)/(45-81) 139/81 (01/11 0834) SpO2:  [94 %-100 %] 100 % (01/11 0834)  Weight change:  Filed Weights   11/02/18 2346 11/02/18 2351  Weight: 109.3 kg 109.3 kg    Intake/Output:    Intake/Output Summary (Last 24 hours) at 11/05/2018 1339 Last data filed at 11/05/2018 0400 Gross per 24 hour  Intake 289 ml  Output 375 ml  Net -86 ml   Physical Exam: General:  Obese, elderly frail, laying in the bed  HEENT  mouth is dry, decreased hearing  Lungs:  Coarse breath sounds bilaterally, nasal cannula oxygen  Heart::  No rub  Abdomen:  Soft, nontender  Extremities:  No edema  Neurologic:  Alert, able to answer simple questions appropriately  Skin:  No acute rashes  Foley:  purewick system      Basic Metabolic Panel:  Recent Labs  Lab 11/02/18 1905 11/03/18 0410 11/03/18 1500 11/04/18 0855 11/05/18 0449  NA 135 136 137 135 138  K 4.8 5.0 5.0 4.4 3.9  CL 93* 100 100 101 105  CO2 23 25 27 26 26   GLUCOSE 241* 162* 132* 111* 94  BUN 30* 35* 36* 39* 34*  CREATININE 3.03* 3.10* 3.12* 2.98* 2.33*  CALCIUM 8.6* 7.7* 7.8* 7.9* 8.0*     CBC: Recent Labs  Lab 11/02/18 1856  11/03/18 0410  11/03/18 2314 11/04/18 0855 11/04/18 1125 11/04/18 2258 11/05/18 0449  WBC 35.9*  --  41.4*  --   --  29.1*  --   --  24.0*  NEUTROABS 30.5*  --   --   --   --   --   --   --   --   HGB 8.8*   < > 7.0*   < > 6.6* 7.5* 7.2* 8.1* 8.2*  HCT 30.6*   < > 22.6*   < > 21.1* 23.4* 22.7* 25.4* 26.0*  MCV 94.7  --  91.5  --   --  89.3  --    --  90.0  PLT 447*  --  345  --   --  246  --   --  233   < > = values in this interval not displayed.     No results found for: HEPBSAG, HEPBSAB, HEPBIGM    Microbiology:  Recent Results (from the past 240 hour(s))  Culture, blood (Routine x 2)     Status: None (Preliminary result)   Collection Time: 11/02/18  7:06 PM  Result Value Ref Range Status   Specimen Description BLOOD LEFT ANTECUBITAL  Final   Special Requests   Final    BOTTLES DRAWN AEROBIC AND ANAEROBIC Blood Culture adequate volume   Culture   Final    NO GROWTH 3 DAYS Performed at [facility], [address] [place], [place] [address]    Report Status PENDING  Incomplete  MRSA PCR Screening     Status: None   Collection Time: 11/02/18 11:43 PM  Result Value Ref Range Status   MRSA by PCR NEGATIVE NEGATIVE Final    Comment:  The GeneXpert MRSA Assay (FDA approved for NASAL specimens only), is one component of a comprehensive MRSA colonization surveillance program. It is not intended to diagnose MRSA infection nor to guide or monitor treatment for MRSA infections. Performed at [facility], [address] [place], [place] [address]   Culture, blood (Routine x 2)     Status: None (Preliminary result)   Collection Time: 11/03/18 12:38 AM  Result Value Ref Range Status   Specimen Description BLOOD RIGHT ANTECUBITAL  Final   Special Requests   Final    BOTTLES DRAWN AEROBIC AND ANAEROBIC Blood Culture results may not be optimal due to an excessive volume of blood received in culture bottles   Culture   Final    NO GROWTH 2 DAYS Performed at [facility], [address] [place], [place] [address]    Report Status PENDING  Incomplete  Culture, Urine     Status: Abnormal   Collection Time: 11/03/18  7:48 PM  Result Value Ref Range Status   Specimen Description URINE, RANDOM  Final   Special Requests   Final    Normal Performed at [facility], [address] [place], [place] [address]    Culture <10,000 COLONIES/mL INSIGNIFICANT GROWTH (A)  Final   Report Status 11/05/2018 FINAL  Final    Coagulation Studies: Recent Labs    11/03/18 0410 11/03/18 1358 11/03/18 2140 11/04/18 0855 11/05/18 0449  LABPROT 15.9* 15.7* 16.9* 16.9* 15.5*  INR 1.28 1.26 1.39 1.39 1.24    Urinalysis: Recent Labs    11/02/18 1856  COLORURINE YELLOW*  LABSPEC 1.020  PHURINE 5.0  GLUCOSEU NEGATIVE  HGBUR MODERATE*  BILIRUBINUR NEGATIVE  KETONESUR NEGATIVE  PROTEINUR 30*  NITRITE NEGATIVE  LEUKOCYTESUR TRACE*      Imaging: [place] Renal  Result Date: 11/03/2018 CLINICAL DATA:  Acute renal failure EXAM: RENAL / URINARY TRACT ULTRASOUND COMPLETE COMPARISON:  CT abdomen pelvis of 11/02/2018 FINDINGS: Right Kidney: Renal measurements: 11.5 x 5.0 x 6.3 cm. = volume: 188 mL. No hydronephrosis is seen. However there is a hypoechoic structure along the upper pole of the right kidney measuring 7.9 x 4.9 x 9.1 cm. This is consistent with the pararenal hematoma noted on recent CT. Left Kidney: Renal measurements: 11.4 x 5.0 x 4.8 cm. = volume: 142 mL. No hydronephrosis is seen. The echogenicity of the renal parenchyma is within normal limits. Bladder: The urinary bladder is moderately distended with no abnormality noted. IMPRESSION: 1. No hydronephrosis. 2. Apparent right pararenal hematoma of 7.9 x 4.9 x 9.1 cm, when compared to recent CT of the abdomen pelvis. Electronically Signed   By: Dwyane DeePaul  Barry M.D.   On: 11/03/2018 15:59     Medications:   . meropenem (MERREM) IV 500 mg (11/05/18 1002)   . sodium chloride   Intravenous Once  . carbidopa-levodopa  1 tablet Oral TID  . insulin aspart  0-20 Units Subcutaneous Q4H  . mouth rinse  15 mL Mouth Rinse BID  . pantoprazole  40 mg Oral BID AC  . sodium chloride flush  10-40 mL Intracatheter Q12H   acetaminophen **OR** acetaminophen, guaiFENesin, HYDROmorphone (DILAUDID) injection, ipratropium-albuterol,  ondansetron **OR** ondansetron (ZOFRAN) IV, sodium chloride flush  Assessment/ Plan:  Pt is a 78 y.o. caucasian  female with atrial fibrillation, hypertension, Parkinson's, was admitted on 11/02/2018 with atrial fibrillation, hypertension, bedbound status, who was admitted to [facility] on 11/02/2018 for evaluation of Abdominal pain and is found to have acute right renal hemorrhage with  large acute hematoma, fever, hypotension, sepsis, AKI  1.  Acute kidney injury Baseline creatinine 0.76 from August 24, 2018 Acute kidney injury is likely multifactorial from hypotension, hemorrhage, volume loss, possible sepsis  Serum creatinine improved today from 2.98 down to 2.33.  Urine output measurement is not accurate but appears to be improving  2.  Hyperglycemia Elevated blood sugars are noted, hemoglobin A1c 5.5%  3. Sepsis, hypotension, lactic acidosis Volume resuscitation, broad-spectrum antibiotics as per hospitalist team Source unclear  4. Rt perinephric hemorrhage S/p coil embolization by Dr Wyn Quaker 11/03/2018   LOS: 3 Carden Teel Thedore Mins 1/11/20201:39 PM  [facility] [address] [place], [place] [phone]  Note: This note was prepared with Dragon dictation. Any transcription errors are unintentional

## 2018-11-05 NOTE — Progress Notes (Signed)
[facility] [place] [facility]   PATIENT NAME: Mikayla [name]    MR#:  [id number]  DATE OF BIRTH:  04-24-1941  SUBJECTIVE:  CHIEF COMPLAINT:   Chief Complaint  Patient presents with  . Emesis  . Abdominal Pain   The patient has no complaints, on oxygen by nasal cannula 2 L REVIEW OF SYSTEMS:  Review of Systems  Constitutional: Negative for chills and fever.  HENT: Negative for hearing loss and tinnitus.   Eyes: Negative for blurred vision and double vision.  Respiratory: Negative for cough and hemoptysis.   Cardiovascular: Negative for chest pain and palpitations.  Gastrointestinal: Negative for abdominal pain, heartburn, nausea and vomiting.  Genitourinary: Negative for dysuria and urgency.  Musculoskeletal: Negative for myalgias and neck pain.  Skin: Negative for itching and rash.  Neurological: Negative for dizziness and headaches.  Psychiatric/Behavioral: Negative for depression and hallucinations.    DRUG ALLERGIES:   Allergies  Allergen Reactions  . Levaquin [Levofloxacin]   . Other Nausea And Vomiting  . Sulfa Antibiotics Hives  . Oxycodone Anxiety and Other (See Comments)   VITALS:  Blood pressure 139/81, pulse 80, temperature 98.2 F (36.8 C), resp. rate 20, height 5\' 4"  (1.626 m), weight 109.3 kg, SpO2 100 %. PHYSICAL EXAMINATION:   Physical Exam  Constitutional: She is oriented to person, place, and time and well-developed, well-nourished, and in no distress. No distress.  Morbidly obese  HENT:  Head: Normocephalic and atraumatic.  Eyes: Pupils are equal, round, and reactive to light. Conjunctivae and EOM are normal. No scleral icterus.  Neck: Normal range of motion. No JVD present. No tracheal deviation present. No thyromegaly present.  Cardiovascular: Normal rate, regular rhythm and normal heart sounds.  Pulmonary/Chest: Effort normal. No stridor. No respiratory distress. She has no wheezes. She has no rales.  Abdominal:  Soft. Bowel sounds are normal. She exhibits no distension. There is no abdominal tenderness.  Musculoskeletal:        General: No tenderness or edema.  Neurological: She is alert and oriented to person, place, and time.  Skin: Skin is warm. She is not diaphoretic.  Psychiatric: Affect normal.   LABORATORY PANEL:  Female CBC Recent Labs  Lab 11/05/18 0449  WBC 24.0*  HGB 8.2*  HCT 26.0*  PLT 233   ------------------------------------------------------------------------------------------------------------------ Chemistries  Recent Labs  Lab 11/02/18 1905  11/05/18 0449  NA 135   < > 138  K 4.8   < > 3.9  CL 93*   < > 105  CO2 23   < > 26  GLUCOSE 241*   < > 94  BUN 30*   < > 34*  CREATININE 3.03*   < > 2.33*  CALCIUM 8.6*   < > 8.0*  AST 212*  --   --   ALT 83*  --   --   ALKPHOS 83  --   --   BILITOT 0.6  --   --    < > = values in this interval not displayed.   RADIOLOGY:  No results found. ASSESSMENT AND PLAN:     1.  Severe sepsis, possible due to UTI. Improving with IV fluid hydration. Continue empiric antibiotics with meropenem, blood culture is negative so far, urine culture report no significant growth.  2.  Right-sided perinephric hematoma Secondary to Coumadin therapy which was reversed with with vitamin K reversal protocol. Since the ptient not a surgical candidate for nephrectomy, she got endovascular embolization of the right kidney.  3.  Acute kidney injury, Likely due to hemorrhage and sepsis. Improving with IV fluid hydration.  Nephrology service following patient.  Follow-up BMP.  4. HTN (hypertension) -hold all antihypertensives due to low blood pressure on presentation. Gradually resume as blood pressure begins to trend up  5.  PAF (paroxysmal atrial fibrillation) (HCC) - Coumadin placed on hold due to renal hemorrhage.   Rate controlled  DVT prophylaxis; SCDs for now No Coumadin or heparin products due to retinal bleed.  Anemia of  chronic disease and acute blood loss due to Coumadin and renal hematoma.  Hemoglobin down to 7.2. Hemoglobin up to 8.2 after 1 unit PRBC transfusion.  Leukocytosis.  Possible due to reaction to renal hematoma and UTI. Follow-up CBC.  Chronic respiratory failure, continue home oxygen 3 L.  NEB as needed.  Morbid obesity diet control and follow-up PCP.  I discussed with Dr. Thedore Mins. All the records are reviewed and case discussed with Care Management/Social Worker. Management plans discussed with the patient, 2 daughters and they are in agreement.  CODE STATUS: DNR  TOTAL TIME TAKING CARE OF THIS PATIENT: 27 minutes.   More than 50% of the time was spent in counseling/coordination of care: YES  POSSIBLE D/C IN 2 DAYS, DEPENDING ON CLINICAL CONDITION.   Shaune Pollack M.D on 11/05/2018 at 2:37 PM  Between 7am to 6pm - Pager - [phone]  After 6pm go to [email or web address] - Social research officer, government  [facility] [place] [facility]  Office  [phone]  CC: Primary care physician; System, Pcp Not In  Note: This dictation was prepared with Dragon dictation along with smaller phrase technology. Any transcriptional errors that result from this process are unintentional.

## 2018-11-05 NOTE — Progress Notes (Signed)
MEDICATION RELATED CONSULT NOTE  Pharmacy Consult for K-Centra  Indication:   Peri-nephric hematoma ,  Warfarin reversal   Allergies  Allergen Reactions  . Levaquin [Levofloxacin]   . Other Nausea And Vomiting  . Sulfa Antibiotics Hives  . Oxycodone Anxiety and Other (See Comments)    Patient Measurements: Height: 5\' 4"  (162.6 cm) Weight: 240 lb 15.4 oz (109.3 kg) IBW/kg (Calculated) : 54.7 Adjusted Body Weight:   Vital Signs: Temp: 98.2 F (36.8 C) (01/11 0834) BP: 139/81 (01/11 0834) Pulse Rate: 80 (01/11 0834) Intake/Output from previous day: 01/10 0701 - 01/11 0700 In: 289 [Blood:289] Out: 525 [Urine:525] Intake/Output from this shift: No intake/output data recorded.  Labs: Recent Labs    11/02/18 1905  11/03/18 0410  11/03/18 1500  11/04/18 0855 11/04/18 1125 11/04/18 2258 11/05/18 0449  WBC  --   --  41.4*  --   --   --  29.1*  --   --  24.0*  HGB  --    < > 7.0*   < >  --    < > 7.5* 7.2* 8.1* 8.2*  HCT  --    < > 22.6*  --   --    < > 23.4* 22.7* 25.4* 26.0*  PLT  --   --  345  --   --   --  246  --   --  233  CREATININE 3.03*  --  3.10*  --  3.12*  --  2.98*  --   --  2.33*  ALBUMIN 3.3*  --   --   --   --   --   --   --   --   --   PROT 7.0  --   --   --   --   --   --   --   --   --   AST 212*  --   --   --   --   --   --   --   --   --   ALT 83*  --   --   --   --   --   --   --   --   --   ALKPHOS 83  --   --   --   --   --   --   --   --   --   BILITOT 0.6  --   --   --   --   --   --   --   --   --   BILIDIR 0.1  --   --   --   --   --   --   --   --   --   IBILI 0.5  --   --   --   --   --   --   --   --   --    < > = values in this interval not displayed.   Estimated Creatinine Clearance: 24 mL/min (A) (by C-G formula based on SCr of 2.33 mg/dL (H)).   Medical History: Past Medical History:  Diagnosis Date  . Atrial fibrillation (HCC)   . Empyema [facility])    in [facility] Aug 2017- stayed in hospital on for 2 months.  . Hypertension        Assessment: Pharmacy consulted to dose and monitor Theodoro Parma in this 78 year old female with peri - nephric hematoma, on warfarin PTA. 1/8:  INR @ 1900 = 3.06   TBW = 145.3 lbs (66kg)  1/8 Kcentra 1644 units IV x 1 dose ordered  1/9 @   0410 INR: 1.28 1/9 @   1358 INR: 1.26 1/9 @   2140 INR: 1.39 1/10 @ 0855 INR: 1.43 1/11 @ 0449 INR: 1.24  Goal of Therapy:  Reversal of anticoagulation   Plan:  Continue checking INR Q6H x 4 and then daily x2.  Last INR check 1/11 AM.    Stormy Card, [facility] Clinical Pharmacist 11/05/2018 10:19 AM

## 2018-11-06 ENCOUNTER — Other Ambulatory Visit: Payer: Self-pay

## 2018-11-06 LAB — GLUCOSE, CAPILLARY
Glucose-Capillary: 74 mg/dL (ref 70–99)
Glucose-Capillary: 82 mg/dL (ref 70–99)
Glucose-Capillary: 85 mg/dL (ref 70–99)
Glucose-Capillary: 88 mg/dL (ref 70–99)
Glucose-Capillary: 89 mg/dL (ref 70–99)
Glucose-Capillary: 91 mg/dL (ref 70–99)

## 2018-11-06 LAB — BASIC METABOLIC PANEL
ANION GAP: 8 (ref 5–15)
BUN: 26 mg/dL — ABNORMAL HIGH (ref 8–23)
CALCIUM: 7.8 mg/dL — AB (ref 8.9–10.3)
CO2: 24 mmol/L (ref 22–32)
Chloride: 103 mmol/L (ref 98–111)
Creatinine, Ser: 1.68 mg/dL — ABNORMAL HIGH (ref 0.44–1.00)
GFR calc Af Amer: 33 mL/min — ABNORMAL LOW (ref >60)
GFR calc non Af Amer: 29 mL/min — ABNORMAL LOW (ref >60)
Glucose, Bld: 84 mg/dL (ref 70–99)
Potassium: 3.9 mmol/L (ref 3.5–5.1)
Sodium: 135 mmol/L (ref 135–145)

## 2018-11-06 LAB — CBC
HEMATOCRIT: 26.7 % — AB (ref 36.0–46.0)
Hemoglobin: 8.3 g/dL — ABNORMAL LOW (ref 12.0–15.0)
MCH: 28.3 pg (ref 26.0–34.0)
MCHC: 31.1 g/dL (ref 30.0–36.0)
MCV: 91.1 fL (ref 80.0–100.0)
Platelets: 248 10*3/uL (ref 150–400)
RBC: 2.93 MIL/uL — ABNORMAL LOW (ref 3.87–5.11)
RDW: 15.9 % — ABNORMAL HIGH (ref 11.5–15.5)
WBC: 17.5 10*3/uL — ABNORMAL HIGH (ref 4.0–10.5)
nRBC: 0.1 % (ref 0.0–0.2)

## 2018-11-06 MED ORDER — FERROUS SULFATE 325 (65 FE) MG PO TABS
325.0000 mg | ORAL_TABLET | Freq: Every day | ORAL | Status: DC
  Administered 2018-11-06 – 2018-11-08 (×3): 325 mg via ORAL
  Filled 2018-11-06 (×3): qty 1

## 2018-11-06 MED ORDER — CEPHALEXIN 500 MG PO CAPS
500.0000 mg | ORAL_CAPSULE | Freq: Two times a day (BID) | ORAL | Status: AC
  Administered 2018-11-06 – 2018-11-07 (×3): 500 mg via ORAL
  Filled 2018-11-06 (×3): qty 1

## 2018-11-06 MED ORDER — TRAZODONE HCL 50 MG PO TABS
25.0000 mg | ORAL_TABLET | Freq: Every day | ORAL | Status: DC
  Administered 2018-11-06 – 2018-11-07 (×2): 25 mg via ORAL
  Filled 2018-11-06 (×2): qty 1

## 2018-11-06 MED ORDER — MELATONIN 5 MG PO TABS
5.0000 mg | ORAL_TABLET | Freq: Every day | ORAL | Status: DC
  Administered 2018-11-06 – 2018-11-07 (×2): 5 mg via ORAL
  Filled 2018-11-06 (×3): qty 1

## 2018-11-06 MED ORDER — AMIODARONE HCL 200 MG PO TABS
200.0000 mg | ORAL_TABLET | Freq: Every day | ORAL | Status: DC
  Administered 2018-11-06 – 2018-11-08 (×3): 200 mg via ORAL
  Filled 2018-11-06 (×3): qty 1

## 2018-11-06 MED ORDER — OXYMETAZOLINE HCL 0.05 % NA SOLN
2.0000 | Freq: Two times a day (BID) | NASAL | Status: DC
  Administered 2018-11-06 – 2018-11-08 (×4): 2 via NASAL
  Filled 2018-11-06: qty 15

## 2018-11-06 MED ORDER — SENNA 8.6 MG PO TABS: 1.0000 | ORAL_TABLET | Freq: Every day | ORAL | Status: DC | PRN

## 2018-11-06 MED ORDER — PAROXETINE HCL 30 MG PO TABS
30.0000 mg | ORAL_TABLET | Freq: Every day | ORAL | Status: DC
  Administered 2018-11-07 – 2018-11-08 (×2): 30 mg via ORAL
  Filled 2018-11-06 (×2): qty 1

## 2018-11-06 MED ORDER — BISACODYL 10 MG RE SUPP
10.0000 mg | Freq: Every day | RECTAL | Status: DC
  Administered 2018-11-07 – 2018-11-08 (×2): 10 mg via RECTAL
  Filled 2018-11-06 (×2): qty 1

## 2018-11-06 NOTE — Plan of Care (Signed)
Discussed with patient and family plan of care for the evening, pain management and concern about arm swelling and left hand from lab draw with some teach back displayed

## 2018-11-06 NOTE — Progress Notes (Signed)
[facility] - [place] at [facility]   PATIENT NAME: Mikayla [name]    MR#:  [id number]  DATE OF BIRTH:  06-Oct-1941  SUBJECTIVE:  CHIEF COMPLAINT:   Chief Complaint  Patient presents with  . Emesis  . Abdominal Pain   The patient has no complaints, on oxygen by nasal cannula 2 L REVIEW OF SYSTEMS:  Review of Systems  Constitutional: Negative for chills and fever.  HENT: Negative for hearing loss and tinnitus.   Eyes: Negative for blurred vision and double vision.  Respiratory: Negative for cough and hemoptysis.   Cardiovascular: Negative for chest pain and palpitations.  Gastrointestinal: Negative for abdominal pain, heartburn, nausea and vomiting.  Genitourinary: Negative for dysuria and urgency.  Musculoskeletal: Negative for myalgias and neck pain.  Skin: Negative for itching and rash.  Neurological: Negative for dizziness and headaches.  Psychiatric/Behavioral: Negative for depression and hallucinations.    DRUG ALLERGIES:   Allergies  Allergen Reactions  . Levaquin [Levofloxacin]   . Other Nausea And Vomiting  . Sulfa Antibiotics Hives  . Oxycodone Anxiety and Other (See Comments)   VITALS:  Blood pressure (!) 131/43, pulse 74, temperature 97.8 F (36.6 C), temperature source Oral, resp. rate 18, height 5\' 4"  (1.626 m), weight 109.3 kg, SpO2 100 %. PHYSICAL EXAMINATION:   Physical Exam  Constitutional: She is oriented to person, place, and time and well-developed, well-nourished, and in no distress. No distress.  Morbidly obese  HENT:  Head: Normocephalic and atraumatic.  Eyes: Pupils are equal, round, and reactive to light. Conjunctivae and EOM are normal. No scleral icterus.  Neck: Normal range of motion. No JVD present. No tracheal deviation present. No thyromegaly present.  Cardiovascular: Normal rate, regular rhythm and normal heart sounds.  Pulmonary/Chest: Effort normal. No stridor. No respiratory distress. She has no wheezes. She  has no rales.  Abdominal: Soft. Bowel sounds are normal. She exhibits no distension. There is no abdominal tenderness.  Musculoskeletal:        General: No tenderness or edema.  Neurological: She is alert and oriented to person, place, and time.  Skin: Skin is warm. She is not diaphoretic.  Psychiatric: Affect normal.   LABORATORY PANEL:  Female CBC Recent Labs  Lab 11/06/18 0857  WBC 17.5*  HGB 8.3*  HCT 26.7*  PLT 248   ------------------------------------------------------------------------------------------------------------------ Chemistries  Recent Labs  Lab 11/02/18 1905  11/06/18 0857  NA 135   < > 135  K 4.8   < > 3.9  CL 93*   < > 103  CO2 23   < > 24  GLUCOSE 241*   < > 84  BUN 30*   < > 26*  CREATININE 3.03*   < > 1.68*  CALCIUM 8.6*   < > 7.8*  AST 212*  --   --   ALT 83*  --   --   ALKPHOS 83  --   --   BILITOT 0.6  --   --    < > = values in this interval not displayed.   RADIOLOGY:  No results found. ASSESSMENT AND PLAN:     1.  Severe sepsis, possible due to UTI. Improving with IV fluid hydration. She is treated with empiric antibiotics with meropenem, blood culture is negative so far, urine culture report no significant growth. Change to po Keflex.  2.  Right-sided perinephric hematoma Secondary to Coumadin therapy which was reversed with with vitamin K reversal protocol. Since the ptient not a surgical  candidate for nephrectomy, she got endovascular embolization of the right kidney.  3.  Acute kidney injury, Likely due to hemorrhage and sepsis. Improving with IV fluid hydration.  Nephrology service following patient.   4. HTN (hypertension) -hold all antihypertensives due to low blood pressure on presentation. Gradually resume as blood pressure begins to trend up  5.  PAF (paroxysmal atrial fibrillation) (HCC) - Coumadin placed on hold due to renal hemorrhage.   Rate controlled  DVT prophylaxis; SCDs for now No Coumadin or heparin  products due to retinal bleed.  Anemia of chronic disease and acute blood loss due to Coumadin and renal hematoma.  Hemoglobin down to 7.2. Hemoglobin up to 8.3 after 1 unit PRBC transfusion.  Leukocytosis.  Possible due to reaction to renal hematoma and UTI. Follow-up CBC.  Chronic respiratory failure, continue home oxygen 3 L.  NEB as needed.  Morbid obesity diet control and follow-up PCP.  I discussed with Dr. Thedore Mins. All the records are reviewed and case discussed with Care Management/Social Worker. Management plans discussed with the patient, 2 daughters and they are in agreement.  CODE STATUS: DNR  TOTAL TIME TAKING CARE OF THIS PATIENT: 26 minutes.   More than 50% of the time was spent in counseling/coordination of care: YES  POSSIBLE D/C IN 1-2 DAYS, DEPENDING ON CLINICAL CONDITION.    Shaune Pollack M.D on 11/06/2018 at 11:54 AM  Between 7am to 6pm - Pager - [phone]  After 6pm go to [email or web address] - Social research officer, government  [facility] [place] [facility]  Office  [phone]  CC: Primary care physician; System, Pcp Not In  Note: This dictation was prepared with Dragon dictation along with smaller phrase technology. Any transcriptional errors that result from this process are unintentional.

## 2018-11-06 NOTE — Progress Notes (Signed)
Unable to crush scheduled Protonix. MD Imogene Burnhen notified. orders received to d/c. Med d/c.

## 2018-11-06 NOTE — Clinical Social Work Note (Signed)
Clinical Social Work Assessment  Patient Details  Name: KYLINN SHROPSHIRE MRN: [id number] Date of Birth: 1941-05-28  Date of referral:  11/06/18               Reason for consult:  Discharge Planning, Facility Placement, Care Management Concerns                Permission sought to share information with:  Case Manager, Facility Contact Representative, Guardian(Atkinson,[name] Daughter [phone] ) Permission granted to share information::  Yes, Verbal Permission Granted  Name::     Elwyn Lade Daughter [phone]   Agency::  [facility] [phone]  Relationship::  Atkinson,[name] Daughter [phone]   Contact Information:  Atkinson,[name] Daughter [phone]   Housing/Transportation Living arrangements for the past 2 months:  [facility] Nursing Facility([facility] ) Source of Information:  Patient, Other (Comment Required)([facility] [employer]) Patient Interpreter Needed:  None Criminal Activity/Legal Involvement Pertinent to Current Situation/Hospitalization:  No - Comment as needed Significant Relationships:  Adult Children Lives with:  Facility Resident([facility]) Do you feel safe going back to the place where you live?  Yes Need for family participation in patient care:  No (Coment)  Care giving concerns:  Patient will discharge to SNF per patient/family.    Social Worker assessment / plan: CSW met with 16 y.o., caucasian patient who gave verbal permission for assessment today.  Pt admitted to the hospital w/complaint of fever, abdominal pain and vomiting.  Pt resides at a SNF [facility] since 2017.  Plan to discharge to [facility].  Pt currently on 2L o2. Contractions on left side. Pt in need of total care.   Plan: Patient will return to [facility] when medically stable.  CSW is following.   Employment status:  Retired Nurse, adult, Medicaid In [place] PT Recommendations:    Information / Referral to community  resources:  [facility](Discharging to [facility])  Patient/Family's Response to care:  CSW spoke to pt who elected to return to [place] [facility].  CSW left message for the pt's family. CSW waiting from response from family.    Patient/Family's Understanding of and Emotional Response to Diagnosis, Current Treatment, and Prognosis:  The pt is aware of discharge plan and is in agreement.   Emotional Assessment Appearance:  Appears stated age, Well-Groomed Attitude/Demeanor/Rapport:  Engaged(cooperative and attentive) Affect (typically observed):  Accepting, Calm Orientation:  Oriented to Self, Oriented to Place, Oriented to  Time, Oriented to Situation Alcohol / Substance use:  Not Applicable Psych involvement (Current and /or in the community):  No (Comment)  Discharge Needs  Concerns to be addressed:  No discharge needs identified Readmission within the last 30 days:  No Current discharge risk:  None Barriers to Discharge:  Continued Medical Work up   [employer], [place] 11/06/2018, 10:19 AM

## 2018-11-06 NOTE — NC FL2 (Signed)
[place] MEDICAID FL2 LEVEL OF CARE SCREENING TOOL     IDENTIFICATION  Patient Name: Mikayla [name] Birthdate: 11-Mar-1941 Sex: female Admission Date (Current Location): 11/02/2018  [place] and [place] Number:  Chiropodist and Address:  [facility], [address] [place], [place] [address]      Provider Number: [id number]  Attending Physician Name and Address:  Shaune Pollack, MD  Relative Name and Phone Number:       Current Level of Care: Hospital Recommended Level of Care: Skilled Nursing Facility Prior Approval Number:    Date Approved/Denied:   PASRR Number: [id number]  Discharge Plan: SNF    Current Diagnoses: Patient Active Problem List   Diagnosis Date Noted  . HTN (hypertension) 11/02/2018  . PAF (paroxysmal atrial fibrillation) (HCC) 11/02/2018  . Renal hemorrhage, right 11/02/2018  . Acute cholangitis   . Calculus of bile duct with acute cholangitis with obstruction   . Severe sepsis (HCC) 08/19/2018  . Acute respiratory failure (HCC) 08/10/2018  . Calculus of bile duct without cholangitis or cholecystitis with obstruction   . Cellulitis 11/20/2016  . Pressure injury of skin 09/30/2016  . Palliative care encounter   . Goals of care, counseling/discussion   . Encounter for hospice care discussion     Orientation RESPIRATION BLADDER Height & Weight     Self, Time, Situation, Place  Normal Incontinent(x2) Weight: 240 lb 15.4 oz (109.3 kg) Height:  5\' 4"  (162.6 cm)  BEHAVIORAL SYMPTOMS/MOOD NEUROLOGICAL BOWEL NUTRITION STATUS        Diet(DYS 3)  AMBULATORY STATUS COMMUNICATION OF NEEDS Skin   Extensive Assist Verbally Normal                       Personal Care Assistance Level of Assistance  Total care Bathing Assistance: Maximum assistance     Total Care Assistance: Maximum assistance   Functional Limitations Info  Sight, Hearing, Speech Sight Info: Adequate Hearing Info: Adequate Speech Info:  Adequate    SPECIAL CARE FACTORS FREQUENCY                       Contractures Contractures Info: Present(Left side)    Additional Factors Info  Code Status Code Status Info: DNR             Current Medications (11/06/2018):  This is the current hospital active medication list Current Facility-Administered Medications  Medication Dose Route Frequency Provider Last Rate Last Dose  . 0.9 %  sodium chloride infusion (Manually program via Guardrails IV Fluids)   Intravenous Once Annice Needy, MD      . acetaminophen (TYLENOL) tablet 650 mg  650 mg Oral Q6H PRN Annice Needy, MD   650 mg at 11/05/18 2346   Or  . acetaminophen (TYLENOL) suppository 650 mg  650 mg Rectal Q6H PRN Annice Needy, MD   650 mg at 11/04/18 0403  . carbidopa-levodopa (SINEMET IR) 10-100 MG per tablet immediate release 1 tablet  1 tablet Oral TID Annice Needy, MD   1 tablet at 11/06/18 0944  . guaiFENesin (ROBITUSSIN) 100 MG/5ML solution 100 mg  5 mL Oral Q4H PRN Shaune Pollack, MD   100 mg at 11/04/18 1753  . HYDROmorphone (DILAUDID) injection 1 mg  1 mg Intravenous Once PRN Annice Needy, MD      . insulin aspart (novoLOG) injection 0-20 Units  0-20 Units Subcutaneous Q4H Dew, Marlow Baars, MD  3 Units at 11/03/18 8756  . ipratropium-albuterol (DUONEB) 0.5-2.5 (3) MG/3ML nebulizer solution 3 mL  3 mL Nebulization Q6H PRN Annice Needy, MD   3 mL at 11/05/18 1345  . MEDLINE mouth rinse  15 mL Mouth Rinse BID Annice Needy, MD   15 mL at 11/05/18 2340  . meropenem (MERREM) 500 mg in sodium chloride 0.9 % 100 mL IVPB  500 mg Intravenous Q12H Annice Needy, MD 200 mL/hr at 11/06/18 0822 500 mg at 11/06/18 4332  . ondansetron (ZOFRAN) tablet 4 mg  4 mg Oral Q6H PRN Annice Needy, MD       Or  . ondansetron (ZOFRAN) injection 4 mg  4 mg Intravenous Q6H PRN Annice Needy, MD      . sodium chloride flush (NS) 0.9 % injection 10-40 mL  10-40 mL Intracatheter Q12H Harlon Ditty D, NP   10 mL at 11/05/18 2337  . sodium chloride  flush (NS) 0.9 % injection 10-40 mL  10-40 mL Intracatheter PRN Judithe Modest, NP         Discharge Medications: Please see discharge summary for a list of discharge medications.  Relevant Imaging Results:  Relevant Lab Results:   Additional Information SSN: [id number]   Larwance Rote, LCSW

## 2018-11-07 LAB — TYPE AND SCREEN
ABO/RH(D): A POS
Antibody Screen: NEGATIVE
Unit division: 0
Unit division: 0
Unit division: 0
Unit division: 0

## 2018-11-07 LAB — PREPARE RBC (CROSSMATCH)

## 2018-11-07 LAB — BPAM RBC
BLOOD PRODUCT EXPIRATION DATE: 202001282359
Blood Product Expiration Date: 202001142359
Blood Product Expiration Date: 202001272359
Blood Product Expiration Date: 202001312359
ISSUE DATE / TIME: 202001101706
ISSUE DATE / TIME: 202001100325
Unit Type and Rh: 600
Unit Type and Rh: 6200
Unit Type and Rh: 6200
Unit Type and Rh: 6200

## 2018-11-07 LAB — CULTURE, BLOOD (ROUTINE X 2)
CULTURE: NO GROWTH
Special Requests: ADEQUATE

## 2018-11-07 LAB — CBC
HCT: 27.2 % — ABNORMAL LOW (ref 36.0–46.0)
Hemoglobin: 8.4 g/dL — ABNORMAL LOW (ref 12.0–15.0)
MCH: 28 pg (ref 26.0–34.0)
MCHC: 30.9 g/dL (ref 30.0–36.0)
MCV: 90.7 fL (ref 80.0–100.0)
Platelets: 235 10*3/uL (ref 150–400)
RBC: 3 MIL/uL — ABNORMAL LOW (ref 3.87–5.11)
RDW: 15.9 % — ABNORMAL HIGH (ref 11.5–15.5)
WBC: 14.4 10*3/uL — ABNORMAL HIGH (ref 4.0–10.5)
nRBC: 0 % (ref 0.0–0.2)

## 2018-11-07 LAB — GLUCOSE, CAPILLARY
Glucose-Capillary: 100 mg/dL — ABNORMAL HIGH (ref 70–99)
Glucose-Capillary: 110 mg/dL — ABNORMAL HIGH (ref 70–99)
Glucose-Capillary: 96 mg/dL (ref 70–99)
Glucose-Capillary: 97 mg/dL (ref 70–99)
Glucose-Capillary: 98 mg/dL (ref 70–99)
Glucose-Capillary: 99 mg/dL (ref 70–99)

## 2018-11-07 MED ORDER — OXYMETAZOLINE HCL 0.05 % NA SOLN: 2.0000 | Freq: Two times a day (BID) | NASAL | 0 refills | Status: AC

## 2018-11-07 MED ORDER — FUROSEMIDE 20 MG PO TABS
20.0000 mg | ORAL_TABLET | Freq: Once | ORAL | Status: AC
  Administered 2018-11-07: 20 mg via ORAL
  Filled 2018-11-07: qty 1

## 2018-11-07 NOTE — Care Management Important Message (Signed)
Important Message  Patient Details  Name: Mikayla [name] MRN: [id number] Date of Birth: 1941-08-26   Medicare Important Message Given:  Yes    Olegario Messier A Shanyce Daris 11/07/2018, 10:32 AM

## 2018-11-07 NOTE — Progress Notes (Signed)
[facility] [place], [place] 11/07/18  Subjective:  Creatinine now down to 1.7. Patient resting comfortably in bed at the moment. Daughter at bedside.   Objective:  Vital signs in last 24 hours:  Temp:  [98.1 F (36.7 C)-98.7 F (37.1 C)] 98.2 F (36.8 C) (01/13 0800) Pulse Rate:  [74-78] 74 (01/13 0800) Resp:  [16-18] 17 (01/12 2323) BP: (111-150)/(51-69) 132/53 (01/13 0800) SpO2:  [97 %-100 %] 100 % (01/13 0800)  Weight change:  Filed Weights   11/02/18 2346 11/02/18 2351  Weight: 109.3 kg 109.3 kg    Intake/Output:    Intake/Output Summary (Last 24 hours) at 11/07/2018 1422 Last data filed at 11/07/2018 1000 Gross per 24 hour  Intake 360 ml  Output 1100 ml  Net -740 ml   Physical Exam: General:  Obese, elderly frail, laying in the bed  HEENT   decreased hearing  Lungs:  Coarse breath sounds bilaterally, nasal cannula oxygen  Heart::  S1S2 no rub  Abdomen:  Soft, nontender  Extremities:  No edema  Neurologic:  Alert, able to answer simple questions appropriately  Skin:  No acute rashes  Foley:  purewick system      Basic Metabolic Panel:  Recent Labs  Lab 11/03/18 0410 11/03/18 1500 11/04/18 0855 11/05/18 0449 11/06/18 0857  NA 136 137 135 138 135  K 5.0 5.0 4.4 3.9 3.9  CL 100 100 101 105 103  CO2 25 27 26 26 24   GLUCOSE 162* 132* 111* 94 84  BUN 35* 36* 39* 34* 26*  CREATININE 3.10* 3.12* 2.98* 2.33* 1.68*  CALCIUM 7.7* 7.8* 7.9* 8.0* 7.8*     CBC: Recent Labs  Lab 11/02/18 1856  11/03/18 0410  11/04/18 0855 11/04/18 1125 11/04/18 2258 11/05/18 0449 11/06/18 0857 11/07/18 0353  WBC 35.9*  --  41.4*  --  29.1*  --   --  24.0* 17.5* 14.4*  NEUTROABS 30.5*  --   --   --   --   --   --   --   --   --   HGB 8.8*   < > 7.0*   < > 7.5* 7.2* 8.1* 8.2* 8.3* 8.4*  HCT 30.6*   < > 22.6*   < > 23.4* 22.7* 25.4* 26.0* 26.7* 27.2*  MCV 94.7  --  91.5  --  89.3  --   --  90.0 91.1 90.7  PLT 447*  --  345  --  246  --   --   233 248 235   < > = values in this interval not displayed.     No results found for: HEPBSAG, HEPBSAB, HEPBIGM    Microbiology:  Recent Results (from the past 240 hour(s))  Culture, blood (Routine x 2)     Status: None   Collection Time: 11/02/18  7:06 PM  Result Value Ref Range Status   Specimen Description BLOOD LEFT ANTECUBITAL  Final   Special Requests   Final    BOTTLES DRAWN AEROBIC AND ANAEROBIC Blood Culture adequate volume   Culture   Final    NO GROWTH 5 DAYS Performed at [facility], [address] [place], [place] [address]    Report Status 11/07/2018 FINAL  Final  MRSA PCR Screening     Status: None   Collection Time: 11/02/18 11:43 PM  Result Value Ref Range Status   MRSA by PCR NEGATIVE NEGATIVE Final    Comment:        The GeneXpert MRSA  Assay (FDA approved for NASAL specimens only), is one component of a comprehensive MRSA colonization surveillance program. It is not intended to diagnose MRSA infection nor to guide or monitor treatment for MRSA infections. Performed at [facility], [address] [place], [place] [address]   Culture, blood (Routine x 2)     Status: None (Preliminary result)   Collection Time: 11/03/18 12:38 AM  Result Value Ref Range Status   Specimen Description BLOOD RIGHT ANTECUBITAL  Final   Special Requests   Final    BOTTLES DRAWN AEROBIC AND ANAEROBIC Blood Culture results may not be optimal due to an excessive volume of blood received in culture bottles   Culture   Final    NO GROWTH 4 DAYS Performed at [facility], [address], [place], [place] [address]    Report Status PENDING  Incomplete  Culture, Urine     Status: Abnormal   Collection Time: 11/03/18  7:48 PM  Result Value Ref Range Status   Specimen Description URINE, RANDOM  Final   Special Requests   Final    Normal Performed at [facility], [address] [place], [place] [address]    Culture <10,000  COLONIES/mL INSIGNIFICANT GROWTH (A)  Final   Report Status 11/05/2018 FINAL  Final    Coagulation Studies: Recent Labs    11/05/18 0449  LABPROT 15.5*  INR 1.24    Urinalysis: No results for input(s): COLORURINE, LABSPEC, PHURINE, GLUCOSEU, HGBUR, BILIRUBINUR, KETONESUR, PROTEINUR, UROBILINOGEN, NITRITE, LEUKOCYTESUR in the last 72 hours.  Invalid input(s): APPERANCEUR    Imaging: No results found.   Medications:    . sodium chloride   Intravenous Once  . amiodarone  200 mg Oral Daily  . bisacodyl  10 mg Rectal Daily  . carbidopa-levodopa  1 tablet Oral TID  . cephALEXin  500 mg Oral Q12H  . ferrous sulfate  325 mg Oral Daily  . insulin aspart  0-20 Units Subcutaneous Q4H  . mouth rinse  15 mL Mouth Rinse BID  . Melatonin  5 mg Oral QHS  . oxymetazoline  2 spray Each Nare BID  . PARoxetine  30 mg Oral Daily  . sodium chloride flush  10-40 mL Intracatheter Q12H  . traZODone  25 mg Oral QHS   acetaminophen **OR** acetaminophen, guaiFENesin, HYDROmorphone (DILAUDID) injection, ipratropium-albuterol, ondansetron **OR** ondansetron (ZOFRAN) IV, senna, sodium chloride flush  Assessment/ Plan:  Pt is a 78 y.o. caucasian  female with atrial fibrillation, hypertension, Parkinson's, was admitted on 11/02/2018 with atrial fibrillation, hypertension, bedbound status, who was admitted to [facility] on 11/02/2018 for evaluation of Abdominal pain and is found to have acute right renal hemorrhage with large acute hematoma, fever, hypotension, sepsis, AKI  1.  Acute kidney injury Baseline creatinine 0.76 from August 24, 2018 Acute kidney injury is likely multifactorial from hypotension, hemorrhage, volume loss, possible sepsis -  Renal function continues to improve.  Creatinine down to 1.7.  Continue to monitor renal parameters daily for now.  2.  Hyperglycemia Elevated blood sugars are noted, hemoglobin A1c 5.5%  3. Sepsis, hypotension, lactic acidosis - Appears improved as compared to  admission.  Blood pressure currently 132/53 and bicarbonate improved to 24.  4. Rt perinephric hemorrhage S/p coil embolization by Dr Wyn Quaker 11/03/2018   LOS: 5 Zuhair Lariccia 1/13/20202:22 PM  [facility] [address] [place], [place] [phone]  Note: This note was prepared with Dragon dictation. Any transcription errors are unintentional

## 2018-11-07 NOTE — Progress Notes (Signed)
Clinical Social Worker (CSW) met with patient's daughter Mikayla [name] outside of patient's room to discuss D/C plan. Per daughter she is happy with most of the staff at [facility] but she does have concerns about the day patient came into [facility]. Per daughter she is meeting with [facility] administration tomorrow. Daughter asked CSW to send referral to [facility] SNF in [place]. Patient is currently under long term care medicaid at [facility] and bed bound at baseline. CSW provided emotional support and provided long term care ombudsman contact number to daughter.   CSW sent referral to [facility] SNF in [place]. Per [facility] admissions coordinator they do not accept the type of [facility] that patient has and they do not have any medicaid beds. Per Quillian Quince they do not have an external waiting list for medicaid beds because they have several residents in the building that are on a waiting list for a medicaid bed.  CSW contacted patient's daughter Mikayla [name] and was put on speaker phone because patient's other daughter Chrys Racer was also on the phone. CSW made them aware of what [facility] admissions coordinator reported to [place]. Daughters are in agreement with patient returning to [facility] from [facility] when medically stable. Deborah admissions coordinator at [facility] is aware of above. CSW will continue to follow and assist as needed.   [employer], LCSW [phone]

## 2018-11-07 NOTE — Discharge Summary (Addendum)
[facility] -  at [facility]   PATIENT NAME: Mikayla [name]    MR#:  [id number]  DATE OF BIRTH:  02-20-1941  DATE OF ADMISSION:  11/02/2018   ADMITTING PHYSICIAN: Oralia Manis, MD  DATE OF DISCHARGE: 11/08/2018 PRIMARY CARE PHYSICIAN: System, Pcp Not In   ADMISSION DIAGNOSIS:  Cholecystitis [K81.9] Hematoma of right kidney, initial encounter [S37.011A] Sepsis, due to unspecified organism, unspecified whether acute organ dysfunction present (HCC) [A41.9] DISCHARGE DIAGNOSIS:  Principal Problem:   Severe sepsis (HCC) Active Problems:   HTN (hypertension)   PAF (paroxysmal atrial fibrillation) (HCC)   Renal hemorrhage, right  SECONDARY DIAGNOSIS:   Past Medical History:  Diagnosis Date  . Atrial fibrillation (HCC)   . Empyema [facility])    in [facility] Aug 2017- stayed in hospital on for 2 months.  . Hypertension    HOSPITAL COURSE:  1. Severe sepsis, possible due to UTI. Improving with IV fluid hydration. She is treated with empiric antibiotics with meropenem, blood culture is negative so far, urine culture report no significant growth. Changed to po Keflex and completed 5 days abx.  2.  Right-sided perinephric hematoma Secondary to Coumadin therapy which was reversed with with vitamin K reversal protocol. Since the ptient not a surgical candidate for nephrectomy, she got endovascular embolization of the right kidney.  3.  Acute kidney injury, Likely due to hemorrhage and sepsis. Improved with IV fluid hydration.  4. HTN (hypertension) -hold all antihypertensives due to low blood pressure on presentation. Resume coreg and lasix.  5.PAF (paroxysmal atrial fibrillation) (HCC) - Coumadin placed on hold due to renal hemorrhage.   Rate controlled. Follow up cardiologist/PCP to resume coumadin.  DVT prophylaxis; SCDs for now No Coumadin or heparin products due to retinal bleed.  Anemia of chronic disease and acute blood loss due to Coumadin  and renal hematoma.  Hemoglobin down to 7.2. Hemoglobin up to 8.4 after 1 unit PRBC transfusion.  Leukocytosis.  Possible due to reaction to renal hematoma and UTI. Improving.  Chronic respiratory failure, continue home oxygen 3 L.  NEB as needed.  Morbid obesity diet control and follow-up PCP. DISCHARGE CONDITIONS:  Stable, discharge to SNF today. CONSULTS OBTAINED:  Treatment Team:  Vanna Scotland, MD Mosetta Pigeon, MD DRUG ALLERGIES:   Allergies  Allergen Reactions  . Levaquin [Levofloxacin]   . Other Nausea And Vomiting  . Sulfa Antibiotics Hives  . Oxycodone Anxiety and Other (See Comments)   DISCHARGE MEDICATIONS:   Allergies as of 11/08/2018      Reactions   Levaquin [levofloxacin]    Other Nausea And Vomiting   Sulfa Antibiotics Hives   Oxycodone Anxiety, Other (See Comments)      Medication List    STOP taking these medications   ondansetron 4 MG disintegrating tablet Commonly known as:  ZOFRAN-ODT   potassium chloride SA 20 MEQ tablet Commonly known as:  K-DUR,KLOR-CON   warfarin 5 MG tablet Commonly known as:  COUMADIN     TAKE these medications   acetaminophen 325 MG tablet Commonly known as:  TYLENOL Take 650 mg by mouth every 4 (four) hours as needed for mild pain or fever.   amiodarone 200 MG tablet Commonly known as:  PACERONE Take 1 tablet (200 mg total) by mouth daily.   carbidopa-levodopa 10-100 MG tablet Commonly known as:  SINEMET IR Take 1 tablet by mouth 3 (three) times daily.   carvedilol 3.125 MG tablet Commonly known as:  COREG Take 1 tablet (3.125 mg total)  by mouth 2 (two) times daily with a meal.   CENTROVITE Tabs Take 1 tablet by mouth daily.   ferrous sulfate 324 (65 Fe) MG Tbec Take 1 tablet by mouth daily.   furosemide 20 MG tablet Commonly known as:  LASIX Take 1 tablet (20 mg total) by mouth daily.   gabapentin 300 MG capsule Commonly known as:  NEURONTIN Take 300 mg by mouth at bedtime.     ipratropium-albuterol 0.5-2.5 (3) MG/3ML Soln Commonly known as:  DUONEB Take 3 mLs by nebulization 3 (three) times daily.   Melatonin 5 MG Tabs Take 5 mg by mouth at bedtime.   multivitamin with minerals Tabs tablet Take 1 tablet by mouth daily.   nystatin powder Generic drug:  nystatin Apply topically 2 (two) times daily as needed. APPLY UNDER NECK TWICE DAILY AS NEEDED FOR YEAST   omeprazole 20 MG tablet Commonly known as:  PRILOSEC OTC Take 20 mg by mouth 2 (two) times daily.   oxymetazoline 0.05 % nasal spray Commonly known as:  AFRIN Place 2 sprays into both nostrils 2 (two) times daily for 2 days. For 3 days   PARoxetine 30 MG tablet Commonly known as:  PAXIL Take 30 mg by mouth daily.   senna 8.6 MG Tabs tablet Commonly known as:  SENOKOT Take 1 tablet by mouth daily.   traZODone 50 MG tablet Commonly known as:  DESYREL Take 25 mg by mouth at bedtime.     ASK your doctor about these medications   guaiFENesin 100 MG/5ML Soln Commonly known as:  ROBITUSSIN Take 10 mLs by mouth every 6 (six) hours as needed for cough or to loosen phlegm. FOR 10 DAYS Ask about: Should I take this medication?        DISCHARGE INSTRUCTIONS:  See AVS.  If you experience worsening of your admission symptoms, develop shortness of breath, life threatening emergency, suicidal or homicidal thoughts you must seek medical attention immediately by calling 911 or calling your MD immediately  if symptoms less severe.  You Must read complete instructions/literature along with all the possible adverse reactions/side effects for all the Medicines you take and that have been prescribed to you. Take any new Medicines after you have completely understood and accpet all the possible adverse reactions/side effects.   Please note  You were cared for by a hospitalist during your hospital stay. If you have any questions about your discharge medications or the care you received while you were in  the hospital after you are discharged, you can call the unit and asked to speak with the hospitalist on call if the hospitalist that took care of you is not available. Once you are discharged, your primary care physician will handle any further medical issues. Please note that NO REFILLS for any discharge medications will be authorized once you are discharged, as it is imperative that you return to your primary care physician (or establish a relationship with a primary care physician if you do not have one) for your aftercare needs so that they can reassess your need for medications and monitor your lab values.    On the day of Discharge:  VITAL SIGNS:  Blood pressure (!) 132/53, pulse 74, temperature 98.2 F (36.8 C), temperature source Oral, resp. rate 17, height 5\' 4"  (1.626 m), weight 109.3 kg, SpO2 100 %. PHYSICAL EXAMINATION:  GENERAL:  78 y.o.-year-old patient lying in the bed with no acute distress. Obesity. EYES: Pupils equal, round, reactive to light and accommodation. No scleral  icterus. Extraocular muscles intact.  HEENT: Head atraumatic, normocephalic. Oropharynx and nasopharynx clear.  NECK:  Supple, no jugular venous distention. No thyroid enlargement, no tenderness.  LUNGS: Normal breath sounds bilaterally, no wheezing, rales,rhonchi or crepitation. No use of accessory muscles of respiration.  CARDIOVASCULAR: S1, S2 normal. No murmurs, rubs, or gallops.  ABDOMEN: Soft, non-tender, non-distended. Bowel sounds present. No organomegaly or mass.  EXTREMITIES: No pedal edema, cyanosis, or clubbing.  NEUROLOGIC: Cranial nerves II through XII are intact. Muscle strength 3-4/5 in all extremities. Sensation intact. Gait not checked.  PSYCHIATRIC: The patient is alert and oriented x 3.  SKIN: No obvious rash, lesion, or ulcer.  DATA REVIEW:   CBC Recent Labs  Lab 11/08/18 0552  WBC 15.4*  HGB 8.9*  HCT 29.3*  PLT 270    Chemistries  Recent Labs  Lab 11/02/18 1905   11/08/18 0552  NA 135   < > 137  K 4.8   < > 3.5  CL 93*   < > 101  CO2 23   < > 28  GLUCOSE 241*   < > 108*  BUN 30*   < > 19  CREATININE 3.03*   < > 1.45*  CALCIUM 8.6*   < > 8.0*  AST 212*  --   --   ALT 83*  --   --   ALKPHOS 83  --   --   BILITOT 0.6  --   --    < > = values in this interval not displayed.     Microbiology Results  Results for orders placed or performed during the hospital encounter of 11/02/18  Culture, blood (Routine x 2)     Status: None   Collection Time: 11/02/18  7:06 PM  Result Value Ref Range Status   Specimen Description BLOOD LEFT ANTECUBITAL  Final   Special Requests   Final    BOTTLES DRAWN AEROBIC AND ANAEROBIC Blood Culture adequate volume   Culture   Final    NO GROWTH 5 DAYS Performed at [facility], [address] [place], [place] [address]    Report Status 11/07/2018 FINAL  Final  MRSA PCR Screening     Status: None   Collection Time: 11/02/18 11:43 PM  Result Value Ref Range Status   MRSA by PCR NEGATIVE NEGATIVE Final    Comment:        The GeneXpert MRSA Assay (FDA approved for NASAL specimens only), is one component of a comprehensive MRSA colonization surveillance program. It is not intended to diagnose MRSA infection nor to guide or monitor treatment for MRSA infections. Performed at [facility], [address] [place], [place] [address]   Culture, blood (Routine x 2)     Status: None (Preliminary result)   Collection Time: 11/03/18 12:38 AM  Result Value Ref Range Status   Specimen Description BLOOD RIGHT ANTECUBITAL  Final   Special Requests   Final    BOTTLES DRAWN AEROBIC AND ANAEROBIC Blood Culture results may not be optimal due to an excessive volume of blood received in culture bottles   Culture   Final    NO GROWTH 4 DAYS Performed at [facility], [address] [place], [place] [address]    Report Status PENDING  Incomplete  Culture, Urine     Status: Abnormal    Collection Time: 11/03/18  7:48 PM  Result Value Ref Range Status   Specimen Description URINE, RANDOM  Final   Special Requests  Final    Normal Performed at [facility], [address] [place], [place] [address]    Culture <10,000 COLONIES/mL INSIGNIFICANT GROWTH (A)  Final   Report Status 11/05/2018 FINAL  Final    RADIOLOGY:  No results found.   Management plans discussed with the patient, family and they are in agreement.  CODE STATUS: DNR   TOTAL TIME TAKING CARE OF THIS PATIENT: 33 minutes.    Shaune Pollack M.D on 11/07/2018 at 8:19 AM  Between 7am to 6pm - Pager - [phone]  After 6pm go to [email or web address] - Social research officer, government  [facility]  Office  [phone]  CC: Primary care physician; System, Pcp Not In   Note: This dictation was prepared with Dragon dictation along with smaller phrase technology. Any transcriptional errors that result from this process are unintentional.

## 2018-11-07 NOTE — [place] FL2 (Signed)
[place] MEDICAID FL2 LEVEL OF CARE SCREENING TOOL     IDENTIFICATION  Patient Name: Mikayla [name] Birthdate: 04/01/1941 Sex: female Admission Date (Current Location): 11/02/2018  [facility] and [place] Number:  [address] ([id number]) Facility and Address:  [facility], [address], [place], [place] [address]      Provider Number: [id number]  Attending Physician Name and Address:  Shaune Pollack, MD  Relative Name and Phone Number:       Current Level of Care: Hospital Recommended Level of Care: Skilled Nursing Facility Prior Approval Number:    Date Approved/Denied:   PASRR Number: ([id number])  Discharge Plan: SNF    Current Diagnoses: Patient Active Problem List   Diagnosis Date Noted  . HTN (hypertension) 11/02/2018  . PAF (paroxysmal atrial fibrillation) (HCC) 11/02/2018  . Renal hemorrhage, right 11/02/2018  . Acute cholangitis   . Calculus of bile duct with acute cholangitis with obstruction   . Severe sepsis (HCC) 08/19/2018  . Acute respiratory failure (HCC) 08/10/2018  . Calculus of bile duct without cholangitis or cholecystitis with obstruction   . Cellulitis 11/20/2016  . Pressure injury of skin 09/30/2016  . Palliative care encounter   . Goals of care, counseling/discussion   . Encounter for hospice care discussion     Orientation RESPIRATION BLADDER Height & Weight     Self, Situation, Time, Place  O2(2 Liters Oxygen. ) Incontinent Weight: 240 lb 15.4 oz (109.3 kg) Height:  5\' 4"  (162.6 cm)  BEHAVIORAL SYMPTOMS/MOOD NEUROLOGICAL BOWEL NUTRITION STATUS      Incontinent Diet(Diet: DYS 3 )  AMBULATORY STATUS COMMUNICATION OF NEEDS Skin   Extensive Assist Verbally PU Stage and Appropriate Care(Pressure Ulcer Stage 2 on Coccyx. )                       Personal Care Assistance Level of Assistance  Bathing, Feeding, Dressing Bathing Assistance: Limited assistance Feeding assistance: Independent Dressing Assistance:  Limited assistance Total Care Assistance: Maximum assistance   Functional Limitations Info  Sight, Hearing, Speech Sight Info: Impaired Hearing Info: Impaired Speech Info: Adequate    SPECIAL CARE FACTORS FREQUENCY                       Contractures Contractures Info: Present(Left side)    Additional Factors Info  Code Status, Allergies Code Status Info: (DNR ) Allergies Info: (Levaquin Levofloxacin, Other, Sulfa Antibiotics, Oxycodone)           Current Medications (11/07/2018):  This is the current hospital active medication list Current Facility-Administered Medications  Medication Dose Route Frequency Provider Last Rate Last Dose  . 0.9 %  sodium chloride infusion (Manually program via Guardrails IV Fluids)   Intravenous Once Annice Needy, MD      . acetaminophen (TYLENOL) tablet 650 mg  650 mg Oral Q6H PRN Annice Needy, MD   650 mg at 11/07/18 1014   Or  . acetaminophen (TYLENOL) suppository 650 mg  650 mg Rectal Q6H PRN Annice Needy, MD   650 mg at 11/04/18 0403  . amiodarone (PACERONE) tablet 200 mg  200 mg Oral Daily Shaune Pollack, MD   200 mg at 11/07/18 1013  . bisacodyl (DULCOLAX) suppository 10 mg  10 mg Rectal Daily Shaune Pollack, MD   10 mg at 11/07/18 1014  . carbidopa-levodopa (SINEMET IR) 10-100 MG per tablet immediate release 1 tablet  1 tablet Oral TID Annice Needy, MD   1  tablet at 11/07/18 1014  . cephALEXin (KEFLEX) capsule 500 mg  500 mg Oral Q12H Shaune Pollackhen, Qing, MD   500 mg at 11/07/18 1013  . ferrous sulfate tablet 325 mg  325 mg Oral Daily Shaune Pollackhen, Qing, MD   325 mg at 11/07/18 1013  . guaiFENesin (ROBITUSSIN) 100 MG/5ML solution 100 mg  5 mL Oral Q4H PRN Shaune Pollackhen, Qing, MD   100 mg at 11/04/18 1753  . HYDROmorphone (DILAUDID) injection 1 mg  1 mg Intravenous Once PRN Annice Needyew, Jason S, MD      . insulin aspart (novoLOG) injection 0-20 Units  0-20 Units Subcutaneous Q4H Annice Needyew, Jason S, MD   3 Units at 11/03/18 [phone]  . ipratropium-albuterol (DUONEB) 0.5-2.5 (3) MG/3ML  nebulizer solution 3 mL  3 mL Nebulization Q6H PRN Annice Needyew, Jason S, MD   3 mL at 11/07/18 1052  . MEDLINE mouth rinse  15 mL Mouth Rinse BID Annice Needyew, Jason S, MD   15 mL at 11/07/18 1016  . Melatonin TABS 5 mg  5 mg Oral QHS Shaune Pollackhen, Qing, MD   5 mg at 11/06/18 2210  . ondansetron (ZOFRAN) tablet 4 mg  4 mg Oral Q6H PRN Annice Needyew, Jason S, MD       Or  . ondansetron [facility](ZOFRAN) injection 4 mg  4 mg Intravenous Q6H PRN Annice Needyew, Jason S, MD   4 mg at 11/06/18 2209  . oxymetazoline (AFRIN) 0.05 % nasal spray 2 spray  2 spray Each Nare BID Shaune Pollackhen, Qing, MD   2 spray at 11/07/18 1015  . PARoxetine (PAXIL) tablet 30 mg  30 mg Oral Daily Shaune Pollackhen, Qing, MD   30 mg at 11/07/18 1013  . senna (SENOKOT) tablet 8.6 mg  1 tablet Oral Daily PRN Shaune Pollackhen, Qing, MD      . sodium chloride flush (NS) 0.9 % injection 10-40 mL  10-40 mL Intracatheter Q12H Harlon DittyKeene, Jeremiah D, NP   10 mL at 11/07/18 1015  . sodium chloride flush (NS) 0.9 % injection 10-40 mL  10-40 mL Intracatheter PRN Judithe ModestKeene, Jeremiah D, NP   40 mL at 11/06/18 2212  . traZODone (DESYREL) tablet 25 mg  25 mg Oral QHS Shaune Pollackhen, Qing, MD   25 mg at 11/06/18 2209     Discharge Medications: Please see discharge summary for a list of discharge medications.  Relevant Imaging Results:  Relevant Lab Results:   Additional Information (SSN: [id number])  Teegan Brandis, Darleen CrockerBailey M, LCSW

## 2018-11-07 NOTE — Progress Notes (Signed)
[employer] [facility] [facility] [place] at [facility]   PATIENT NAME: Mikayla LenteCatherine Drahos    MR#:  [id number]  DATE OF BIRTH:  01/29/1941  SUBJECTIVE:  CHIEF COMPLAINT:   Chief Complaint  Patient presents with  . Emesis  . Abdominal Pain   The patient has no complaints, on oxygen by nasal cannula 2 L REVIEW OF SYSTEMS:  Review of Systems  Constitutional: Negative for chills and fever.  HENT: Negative for hearing loss and tinnitus.   Eyes: Negative for blurred vision and double vision.  Respiratory: Negative for cough and hemoptysis.   Cardiovascular: Negative for chest pain and palpitations.  Gastrointestinal: Negative for abdominal pain, heartburn, nausea and vomiting.  Genitourinary: Negative for dysuria and urgency.  Musculoskeletal: Negative for myalgias and neck pain.  Skin: Negative for itching and rash.  Neurological: Negative for dizziness and headaches.  Psychiatric/Behavioral: Negative for depression and hallucinations.    DRUG ALLERGIES:   Allergies  Allergen Reactions  . Levaquin [Levofloxacin]   . Other Nausea And Vomiting  . Sulfa Antibiotics Hives  . Oxycodone Anxiety and Other (See Comments)   VITALS:  Blood pressure (!) 132/53, pulse 74, temperature 98.2 F (36.8 C), temperature source Oral, resp. rate 17, height 5\' 4"  (1.626 m), weight 109.3 kg, SpO2 100 %. PHYSICAL EXAMINATION:   Physical Exam  Constitutional: She is oriented to person, place, and time and well-developed, well-nourished, and in no distress. No distress.  Morbidly obese  HENT:  Head: Normocephalic and atraumatic.  Eyes: Pupils are equal, round, and reactive to light. Conjunctivae and EOM are normal. No scleral icterus.  Neck: Normal range of motion. No JVD present. No tracheal deviation present. No thyromegaly present.  Cardiovascular: Normal rate, regular rhythm and normal heart sounds.  Pulmonary/Chest: Effort normal. No stridor. No respiratory distress. She has no wheezes. She  has no rales.  Abdominal: Soft. Bowel sounds are normal. She exhibits no distension. There is no abdominal tenderness.  Musculoskeletal:        General: No tenderness or edema.  Neurological: She is alert and oriented to person, place, and time.  Skin: Skin is warm. She is not diaphoretic.  Psychiatric: Affect normal.   LABORATORY PANEL:  Female CBC Recent Labs  Lab 11/07/18 0353  WBC 14.4*  HGB 8.4*  HCT 27.2*  PLT 235   ------------------------------------------------------------------------------------------------------------------ Chemistries  Recent Labs  Lab 11/02/18 1905  11/06/18 0857  NA 135   < > 135  K 4.8   < > 3.9  CL 93*   < > 103  CO2 23   < > 24  GLUCOSE 241*   < > 84  BUN 30*   < > 26*  CREATININE 3.03*   < > 1.68*  CALCIUM 8.6*   < > 7.8*  AST 212*  --   --   ALT 83*  --   --   ALKPHOS 83  --   --   BILITOT 0.6  --   --    < > = values in this interval not displayed.   RADIOLOGY:  No results found. ASSESSMENT AND PLAN:   1. Severe sepsis, possible due to UTI. Improving with IV fluid hydration. She is treated withempiric antibiotics with meropenem, blood culture is negative so far, urine culture report no significant growth. Changed to poKeflex.  2. Right-sided perinephric hematoma Secondary to Coumadin therapy which was reversed with with vitamin K reversal protocol. Since the ptient not a surgical candidate for nephrectomy, she got endovascular  embolization of the right kidney.  3. Acute kidney injury, Likely due to hemorrhage and sepsis. Improving with IV fluid hydration. follow up as outpatient.   4. HTN (hypertension) -hold all antihypertensives due to low blood pressure on presentation. Gradually resume if blood pressure begins to trend up.  5.PAF (paroxysmal atrial fibrillation) (HCC) - Coumadin placed on hold due to renal hemorrhage.  Rate controlled. Follow up cardiologist/PCP to resume coumadin.  DVT prophylaxis;  SCDs for now No Coumadin or heparin products due to retinal bleed.  Anemia of chronic disease and acute blood loss due to Coumadin and renal hematoma. Hemoglobin down to 7.2. Hemoglobin up to 8.4after 1 unit PRBC transfusion.  Leukocytosis. Possible due to reaction to renal hematoma and UTI. Improving.  Follow-up CBC.  Chronic respiratory failure, continue home oxygen 3 L. NEB as needed.  Robitussin as needed.  Morbid obesity diet control and follow-up PCP.  All the records are reviewed and case discussed with Care Management/Social Worker. Management plans discussed with the patient, daughter and they are in agreement.  CODE STATUS: DNR  TOTAL TIME TAKING CARE OF THIS PATIENT: 26 minutes.   More than 50% of the time was spent in counseling/coordination of care: YES  POSSIBLE D/C IN 1-2 DAYS, DEPENDING ON CLINICAL CONDITION.    Shaune Pollack M.D on 11/07/2018 at 2:59 PM  Between 7am to 6pm - Pager - [phone]  After 6pm go to [email or web address] - Social research officer, government  [facility] [place] [facility]  Office  [phone]  CC: Primary care physician; System, Pcp Not In  Note: This dictation was prepared with Dragon dictation along with smaller phrase technology. Any transcriptional errors that result from this process are unintentional.

## 2018-11-08 LAB — BASIC METABOLIC PANEL
ANION GAP: 8 (ref 5–15)
BUN: 19 mg/dL (ref 8–23)
CO2: 28 mmol/L (ref 22–32)
Calcium: 8 mg/dL — ABNORMAL LOW (ref 8.9–10.3)
Chloride: 101 mmol/L (ref 98–111)
Creatinine, Ser: 1.45 mg/dL — ABNORMAL HIGH (ref 0.44–1.00)
GFR calc Af Amer: 40 mL/min — ABNORMAL LOW (ref >60)
GFR calc non Af Amer: 34 mL/min — ABNORMAL LOW (ref >60)
Glucose, Bld: 108 mg/dL — ABNORMAL HIGH (ref 70–99)
POTASSIUM: 3.5 mmol/L (ref 3.5–5.1)
Sodium: 137 mmol/L (ref 135–145)

## 2018-11-08 LAB — CULTURE, BLOOD (ROUTINE X 2): Culture: NO GROWTH

## 2018-11-08 LAB — CBC
HCT: 29.3 % — ABNORMAL LOW (ref 36.0–46.0)
HEMOGLOBIN: 8.9 g/dL — AB (ref 12.0–15.0)
MCH: 27.9 pg (ref 26.0–34.0)
MCHC: 30.4 g/dL (ref 30.0–36.0)
MCV: 91.8 fL (ref 80.0–100.0)
Platelets: 270 10*3/uL (ref 150–400)
RBC: 3.19 MIL/uL — ABNORMAL LOW (ref 3.87–5.11)
RDW: 15.8 % — ABNORMAL HIGH (ref 11.5–15.5)
WBC: 15.4 10*3/uL — AB (ref 4.0–10.5)
nRBC: 0 % (ref 0.0–0.2)

## 2018-11-08 LAB — GLUCOSE, CAPILLARY
GLUCOSE-CAPILLARY: 100 mg/dL — AB (ref 70–99)
Glucose-Capillary: 125 mg/dL — ABNORMAL HIGH (ref 70–99)
Glucose-Capillary: 93 mg/dL (ref 70–99)
Glucose-Capillary: 98 mg/dL (ref 70–99)

## 2018-11-08 MED ORDER — FUROSEMIDE 20 MG PO TABS
20.0000 mg | ORAL_TABLET | Freq: Every day | ORAL | Status: DC
  Administered 2018-11-08: 20 mg via ORAL
  Filled 2018-11-08: qty 1

## 2018-11-08 NOTE — Discharge Planning (Signed)
Patient IVs x2 removed.  RN assessment and VS revealed [facility].  Report called and s/w Leo GrosserJennifer Clapp, LPN. Discharge papers given, explained and placed in facility packet with signed golden DNR. EMS contacted to transport to rm 301. Waiting on arrival.

## 2018-11-08 NOTE — Progress Notes (Signed)
[facility] [place], [place] 11/08/18  Subjective:  Patient seen at bedside. Creatinine down to 1.5. Resting comfortably.  Objective:  Vital signs in last 24 hours:  Temp:  [97.8 F (36.6 C)-98.7 F (37.1 C)] 97.8 F (36.6 C) (01/14 1259) Pulse Rate:  [71-75] 71 (01/14 1259) Resp:  [16-18] 18 (01/14 1259) BP: (130-149)/(49-75) 149/75 (01/14 1259) SpO2:  [98 %-100 %] 99 % (01/14 1259)  Weight change:  Filed Weights   11/02/18 2346 11/02/18 2351  Weight: 109.3 kg 109.3 kg    Intake/Output:    Intake/Output Summary (Last 24 hours) at 11/08/2018 1350 Last data filed at 11/08/2018 1300 Gross per 24 hour  Intake 260 ml  Output 2900 ml  Net -2640 ml   Physical Exam: General:  Obese, elderly frail, laying in the bed  HEENT  decreased hearing  Lungs:  Coarse breath sounds bilaterally, nasal cannula oxygen  Heart::  S1S2 no rub  Abdomen:  Soft, nontender  Extremities:  No edema  Neurologic:  Alert, able to answer simple questions appropriately  Skin:  No acute rashes  Foley:  purewick system      Basic Metabolic Panel:  Recent Labs  Lab 11/03/18 1500 11/04/18 0855 11/05/18 0449 11/06/18 0857 11/08/18 0552  NA 137 135 138 135 137  K 5.0 4.4 3.9 3.9 3.5  CL 100 101 105 103 101  CO2 27 26 26 24 28   GLUCOSE 132* 111* 94 84 108*  BUN 36* 39* 34* 26* 19  CREATININE 3.12* 2.98* 2.33* 1.68* 1.45*  CALCIUM 7.8* 7.9* 8.0* 7.8* 8.0*     CBC: Recent Labs  Lab 11/02/18 1856  11/04/18 0855  11/04/18 2258 11/05/18 0449 11/06/18 0857 11/07/18 0353 11/08/18 0552  WBC 35.9*   < > 29.1*  --   --  24.0* 17.5* 14.4* 15.4*  NEUTROABS 30.5*  --   --   --   --   --   --   --   --   HGB 8.8*   < > 7.5*   < > 8.1* 8.2* 8.3* 8.4* 8.9*  HCT 30.6*   < > 23.4*   < > 25.4* 26.0* 26.7* 27.2* 29.3*  MCV 94.7   < > 89.3  --   --  90.0 91.1 90.7 91.8  PLT 447*   < > 246  --   --  233 248 235 270   < > = values in this interval not displayed.     No results  found for: HEPBSAG, HEPBSAB, HEPBIGM    Microbiology:  Recent Results (from the past 240 hour(s))  Culture, blood (Routine x 2)     Status: None   Collection Time: 11/02/18  7:06 PM  Result Value Ref Range Status   Specimen Description BLOOD LEFT ANTECUBITAL  Final   Special Requests   Final    BOTTLES DRAWN AEROBIC AND ANAEROBIC Blood Culture adequate volume   Culture   Final    NO GROWTH 5 DAYS Performed at [facility], [address] [place], [place] [address]    Report Status 11/07/2018 FINAL  Final  MRSA PCR Screening     Status: None   Collection Time: 11/02/18 11:43 PM  Result Value Ref Range Status   MRSA by PCR NEGATIVE NEGATIVE Final    Comment:        The GeneXpert MRSA Assay (FDA approved for NASAL specimens only), is one component of a comprehensive MRSA colonization surveillance program. It is not intended  to diagnose MRSA infection nor to guide or monitor treatment for MRSA infections. Performed at [facility], [address] [place], [place] [address]   Culture, blood (Routine x 2)     Status: None   Collection Time: 11/03/18 12:38 AM  Result Value Ref Range Status   Specimen Description BLOOD RIGHT ANTECUBITAL  Final   Special Requests   Final    BOTTLES DRAWN AEROBIC AND ANAEROBIC Blood Culture results may not be optimal due to an excessive volume of blood received in culture bottles   Culture   Final    NO GROWTH 5 DAYS Performed at [facility], [address] [place], [place] [address]    Report Status 11/08/2018 FINAL  Final  Culture, Urine     Status: Abnormal   Collection Time: 11/03/18  7:48 PM  Result Value Ref Range Status   Specimen Description URINE, RANDOM  Final   Special Requests   Final    Normal Performed at [facility], [address] [place], [place] [address]    Culture <10,000 COLONIES/mL INSIGNIFICANT GROWTH (A)  Final   Report Status 11/05/2018 FINAL  Final    Coagulation  Studies: No results for input(s): LABPROT, INR in the last 72 hours.  Urinalysis: No results for input(s): COLORURINE, LABSPEC, PHURINE, GLUCOSEU, HGBUR, BILIRUBINUR, KETONESUR, PROTEINUR, UROBILINOGEN, NITRITE, LEUKOCYTESUR in the last 72 hours.  Invalid input(s): APPERANCEUR    Imaging: No results found.   Medications:    . sodium chloride   Intravenous Once  . amiodarone  200 mg Oral Daily  . bisacodyl  10 mg Rectal Daily  . carbidopa-levodopa  1 tablet Oral TID  . ferrous sulfate  325 mg Oral Daily  . furosemide  20 mg Oral Daily  . insulin aspart  0-20 Units Subcutaneous Q4H  . mouth rinse  15 mL Mouth Rinse BID  . Melatonin  5 mg Oral QHS  . oxymetazoline  2 spray Each Nare BID  . PARoxetine  30 mg Oral Daily  . sodium chloride flush  10-40 mL Intracatheter Q12H  . traZODone  25 mg Oral QHS   acetaminophen **OR** acetaminophen, guaiFENesin, HYDROmorphone (DILAUDID) injection, ipratropium-albuterol, ondansetron **OR** ondansetron (ZOFRAN) IV, senna, sodium chloride flush  Assessment/ Plan:  Pt is a 78 y.o. caucasian  female with atrial fibrillation, hypertension, Parkinson's, was admitted on 11/02/2018 with atrial fibrillation, hypertension, bedbound status, who was [facility]2018 for evaluation of Abdominal pain and is found to have acute right renal hemorrhage with large acute hematoma, fever, hypotension, sepsis, AKI  1.  Acute kidney injury Baseline creatinine 0.76 from August 24, 2018 Acute kidney injury is likely multifactorial from hypotension, hemorrhage, volume loss, possible sepsis -  Creatinine currently down to 1.45.  Renal function doing better as compared to admission.  Continue periodically monitor.  Will need follow up with Dr. Thedore MinsSingh.   2.  Hyperglycemia Elevated blood sugars are noted, hemoglobin A1c 5.5%  3. Sepsis, hypotension, lactic acidosis - esolved with supportive treatment.  4. Rt perinephric hemorrhage S/p coil embolization  by Dr Wyn Quakerew 11/03/2018   LOS: 6 Dillyn Menna 1/14/20201:50 PM  [facility] [address] [place], [place] [phone]  Note: This note was prepared with Dragon dictation. Any transcription errors are unintentional

## 2018-11-08 NOTE — Plan of Care (Signed)
Discussed with patient plan of care for the evening, pain management and potential discharge tomorrow with some teach back displayed

## 2018-11-08 NOTE — Progress Notes (Signed)
Patient is medically stable for D/C back to [facility] today. Per [name] admissions coordinator at [facility] patient can return today to room 301. RN will call report to [facility] and arrange EMS for transport. Clinical Child psychotherapist (CSW) sent D/C orders to [facility] via [facility]. Patient is aware of above. Patient's 2 daughters [name] and [name] were at bedside and aware of above. Please reconsult if future social work needs arise. CSW signing off.   [employer], LCSW [phone]

## 2018-11-23 ENCOUNTER — Ambulatory Visit: Payer: Medicare Other | Admitting: Nurse Practitioner

## 2018-11-30 ENCOUNTER — Ambulatory Visit: Payer: Medicare Other | Admitting: Nurse Practitioner

## 2018-12-02 ENCOUNTER — Encounter: Payer: Self-pay | Admitting: Nurse Practitioner

## 2018-12-02 ENCOUNTER — Non-Acute Institutional Stay: Payer: Medicaid Other | Admitting: Nurse Practitioner

## 2018-12-02 VITALS — HR 82 | Temp 98.0°F | Resp 18

## 2018-12-02 DIAGNOSIS — R0602 Shortness of breath: Secondary | ICD-10-CM | POA: Insufficient documentation

## 2018-12-02 DIAGNOSIS — Z515 Encounter for palliative care: Secondary | ICD-10-CM

## 2018-12-02 DIAGNOSIS — R5381 Other malaise: Secondary | ICD-10-CM | POA: Insufficient documentation

## 2018-12-02 NOTE — Progress Notes (Signed)
Therap[facility], [facility] Consult Note Telephone: [phone]  Fax: [phone]  PATIENT NAME: Mikayla [name] DOB: 12/08/1940 MRN: [id number]  PRIMARY CARE PROVIDER:  Dr Chari ManningHermann Slade REFERRING PROVIDER: Dr Christell FaithHermann Slade/[facility] RESPONSIBLE PARTY:  Joelene MillinCaroline Atkins [phone] and Sharlyne CaiSuzanne  Buesing [phone] daughters  RECOMMENDATIONS and PLAN:   1. Palliative care encounter Z51.5; Palliative medicine team will continue to support patient, patient's family, and medical team. Visit consisted of counseling and education dealing with the complex and emotionally intense issues of symptom management and palliative care in the setting of serious and potentially life-threatening illness    2. Debility R53.81 secondary to Emphysema progressive, encourage passive rom; encourage to transfer to geri-chair.   3. Dyspneic R06.00 secondary to Emphysema remain stable at present time. Continue O2  ASSESSMENT:     I visited and observed Mikayla [name]. We talked about purpose for palliative care visit. We talked about has she was feeling today. She shared that she is doing well. We talked about symptoms of pain when she denies. We talked about shortness of breath and she shared that it's much better with her oxygen. We talked about being dependent on 02. We talked about mobility and getting out of bed. She talked about having to be hoyer to the chair but she does like to set up in her recliner. She talked about being glad to have recovered from last hospitalization. She talked about how sick she was. She talked about being thankful [facility] as she's been a resident for many years. We talked about family Dynamics. She talked about let her weekend is going to be like. She is hoping her daughters will visit. We talked about both palliative care and plan of care. Discuss that will follow up in 2 months if needed or sooner should she declined.  Emotional support provided.  I called Mikayla ChessmanSuzanne, Mikayla [name]'s daughter. Talked about purpose for palliative care visit in verbal consent obtain to continue to follow under palliative care. Clinical update discussed. We talked about recent hospitalizations and events. We talked about how she's been doing since she's [facility]. We talked about palliative care visit with Mikayla. [name]. We talked about symptoms, appetite. We talked about progression of chronic disease. Medical goals to continue to focus on to treat what is treatable and DNR remains in place. We talked about role of palliative care and plan of care. No new changes to current goals at present time. Will continue to follow monitor with next visit in two months if needed or sooner should she declined. Mikayla ChessmanSuzanne in agreement. Therapeutic listening and emotional support provided. Questions answered satisfaction. Contact information.  I spent 90 minutes providing this consultation,  from 11:45am to 1:15pm. More than 50% of the time in this consultation was spent coordinating communication.   HISTORY OF PRESENT ILLNESS:  Mikayla JohnsCatherine R Fontanez is a 78 y.o. year old female with multiple medical problems including  Emphysema, atrial fibrillation, hypertension, obesity. She have a hospitalization in 05/2016 until 07/2016 for emphysema and Complicated by ventilator dependence for 15 days, tracheostomy and peg tube. Tracheostomy placement was 8 / 10 / 2017 and remove 9 / 29 / 270. Peg tube placement was 8 / 10 / 2017. She did have a right thoracoscopy with the quotation for empyema placement 8 / 14 / 2017 she was then transition to [facility]. She was hospitalized 10 /  15 / 2019 to 10 / 18 / 2019 for sepsis secondary to pneumonia requiring nebulizers, oxygen, IV fluids. Acute on chronic respiratory failure secondary to pneumonia. Hyperbilirubinemia secondary to calculus of bile duct without  cholecystitis with obstruction. GI consulted and high risk for anesthesia with elevated INR. GI recommended IR guided percutaneous transhepatic cholangiogram. Lfts and bilirubin trend it down and she passed stone with no new symptoms so no procedure needed. Paroxysmal atrial fib with sinus tachycardia continued on Coreg with coumadin on hold to resume on discharge. [facility] was [facility]. She was re hospitalized 10 / 25 / 2019 to 10/30 10/2017 for fever with shortness of breath o in emergency department not found to be hypoxic and chest x-ray was not suggestive of pneumonia. Workup significant for acute cholangitis with choledocholithiasis with sepsis upon admission, klebsiella bacteremia, transaminitis. GI was consulted for ercp with having a sphincterotomy with stent placement in stone removal. She was on antibiotic therapy. [facility] was [facility]. Be hospitalized 11/02/2018 to 1/14 / 2020 for severe sepsis secondary to urinary tract infection in periodically treated with antibiotics and IV fluid hydration. Right sided perinephric hematoma secondary to coumadin therapy which was reversed with vitamin K. She is not a surgical candidate for nephrectomy she did receive vascular embolization of right kidney. Acute kidney injury due to sepsis with Hemorrhage and prove with IV fluids. She was hypotensive and blood pressure medicines were on hold but resumed upon discharge Lasix and Coreg. Proximal atrial fibrillation with coumadin on hold and follow-up cardiologist to resume Coumadin once discharged. Anemia of chronic disease with hemoglobin trending down to 7.2 and receive blood transfusion x1 with hemoglobin 8.4 upon discharge. She does remain on chronic oxygen at 3l/[place]. Leukocytosis possible due to renal hematoma with UTI improved upon discharge. Palliative care did consult during hospitalization. Wishes are-to-treat what is  treatable, confirmed DNR and do not intubate, no artificial feeding such as a peg tube. [facility] as a skilled long-term care patient. 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 She is total ADL dependents with episodes of incontinence. She does feed herself and appetite has been improving. Staff endorses has been doing better overall. At present she is lying in bed. She appears to debilitated but comfortable. No visitors present. Palliative Care was asked to help address goals of care.   CODE STATUS: DNR  PPS: 30%  HOSPICE ELIGIBILITY/DIAGNOSIS: TBD  PAST MEDICAL HISTORY:  Past Medical History:  Diagnosis Date  . Atrial fibrillation (HCC)   . Empyema [facility](HCC)    in [facility] Aug 2017- stayed in hospital on for 2 months.  . Hypertension     SOCIAL HX:  Social History   Tobacco Use  . Smoking status: Former Smoker    Types: Cigarettes  . Smokeless tobacco: Never Used  . Tobacco comment: quit in 2001. smoked for 30 years  Substance Use Topics  . Alcohol use: No    ALLERGIES:  Allergies  Allergen Reactions  . Levaquin [Levofloxacin]   . Other Nausea And Vomiting  . Sulfa Antibiotics Hives  . Oxycodone Anxiety and Other (See Comments)     PERTINENT MEDICATIONS:  Outpatient Encounter Medications as of 12/02/2018  Medication Sig  . acetaminophen (TYLENOL) 325 MG tablet Take 650 mg by mouth every 4 (four) hours as needed for mild pain or  fever.  . amiodarone (PACERONE) 200 MG tablet Take 1 tablet (200 mg total) by mouth daily.  . carbidopa-levodopa (SINEMET IR) 10-100 MG tablet Take 1 tablet by mouth 3 (three) times daily.  . carvedilol (COREG) 3.125 MG tablet Take 1 tablet (3.125 mg total) by mouth 2 (two) times daily with a meal. (Patient not taking: Reported on 08/19/2018)  . ferrous sulfate 324 (65 Fe) MG TBEC Take 1 tablet by mouth daily.  . furosemide (LASIX) 20 MG tablet Take 1 tablet (20 mg total) by mouth  daily.  Marland Kitchen gabapentin (NEURONTIN) 300 MG capsule Take 300 mg by mouth at bedtime.  Marland Kitchen ipratropium-albuterol (DUONEB) 0.5-2.5 (3) MG/3ML SOLN Take 3 mLs by nebulization 3 (three) times daily.  . Melatonin 5 MG TABS Take 5 mg by mouth at bedtime.  . Multiple Vitamin (MULTIVITAMIN WITH MINERALS) TABS tablet Take 1 tablet by mouth daily.  . Multiple Vitamins-Minerals (CENTROVITE) TABS Take 1 tablet by mouth daily.  Marland Kitchen nystatin (NYSTATIN) powder Apply topically 2 (two) times daily as needed. APPLY UNDER NECK TWICE DAILY AS NEEDED FOR YEAST  . omeprazole (PRILOSEC OTC) 20 MG tablet Take 20 mg by mouth 2 (two) times daily.  Marland Kitchen PARoxetine (PAXIL) 30 MG tablet Take 30 mg by mouth daily.   Marland Kitchen senna (SENOKOT) 8.6 MG TABS tablet Take 1 tablet by mouth daily.  . traZODone (DESYREL) 50 MG tablet Take 25 mg by mouth at bedtime.   No facility-administered encounter medications on file as of 12/02/2018.     PHYSICAL EXAM:   General: NAD, obese, chroncically ill, pleasant female Cardiovascular: regular rate and rhythm Pulmonary: clear ant fields Abdomen: soft, nontender, + bowel sounds GU: no suprapubic tenderness Extremities: + edema, no joint deformities Skin: no rashes Neurological: Weakness but otherwise nonfocal/functional quadriplegic  Christin Prince [place], NP

## 2018-12-05 ENCOUNTER — Ambulatory Visit: Payer: Medicare Other | Admitting: Physician Assistant

## 2018-12-09 ENCOUNTER — Encounter (INDEPENDENT_AMBULATORY_CARE_PROVIDER_SITE_OTHER): Payer: Medicare Other

## 2018-12-09 ENCOUNTER — Ambulatory Visit (INDEPENDENT_AMBULATORY_CARE_PROVIDER_SITE_OTHER): Payer: Medicare Other | Admitting: Vascular Surgery

## 2018-12-12 ENCOUNTER — Other Ambulatory Visit (INDEPENDENT_AMBULATORY_CARE_PROVIDER_SITE_OTHER): Payer: Self-pay | Admitting: Vascular Surgery

## 2018-12-12 DIAGNOSIS — N280 Ischemia and infarction of kidney: Secondary | ICD-10-CM

## 2018-12-13 ENCOUNTER — Encounter (INDEPENDENT_AMBULATORY_CARE_PROVIDER_SITE_OTHER): Payer: Self-pay | Admitting: Vascular Surgery

## 2018-12-13 ENCOUNTER — Ambulatory Visit (INDEPENDENT_AMBULATORY_CARE_PROVIDER_SITE_OTHER): Payer: Medicare Other | Admitting: Vascular Surgery

## 2018-12-13 ENCOUNTER — Ambulatory Visit (INDEPENDENT_AMBULATORY_CARE_PROVIDER_SITE_OTHER): Payer: Medicare Other

## 2018-12-13 VITALS — BP 166/77 | HR 85 | Resp 16 | Ht 63.0 in | Wt 242.6 lb

## 2018-12-13 DIAGNOSIS — R6520 Severe sepsis without septic shock: Secondary | ICD-10-CM

## 2018-12-13 DIAGNOSIS — I10 Essential (primary) hypertension: Secondary | ICD-10-CM | POA: Diagnosis not present

## 2018-12-13 DIAGNOSIS — N2889 Other specified disorders of kidney and ureter: Secondary | ICD-10-CM

## 2018-12-13 DIAGNOSIS — N280 Ischemia and infarction of kidney: Secondary | ICD-10-CM | POA: Diagnosis not present

## 2018-12-13 DIAGNOSIS — A419 Sepsis, unspecified organism: Secondary | ICD-10-CM

## 2018-12-13 NOTE — Progress Notes (Signed)
MRN : [id number]  Mikayla [name] is a 78 y.o. (1941/06/02) female who presents with chief complaint of  Chief Complaint  Patient presents with  . Follow-up    [facility] 82month renal  .  History of Present Illness: Patient returns today in follow up of right renal embolization performed about a month ago for life-threatening bleeding from the right kidney.  She did well and recovered from her sepsis.  She is in good spirits today.  Her renal function has significantly improved and her remaining left kidney seems to be working quite well.  Duplex is performed today which showed no flow in the right kidney which would be expected and the left kidney was unable to be adequately visualized given the patient's mobility and intolerance to the procedure.  Current Outpatient Medications  Medication Sig Dispense Refill  . acetaminophen (TYLENOL) 325 MG tablet Take 650 mg by mouth every 4 (four) hours as needed for mild pain or fever.    Marland Kitchen amiodarone (PACERONE) 200 MG tablet Take 1 tablet (200 mg total) by mouth daily.    . carbidopa-levodopa (SINEMET IR) 10-100 MG tablet Take 1 tablet by mouth 3 (three) times daily.    . ferrous sulfate 324 (65 Fe) MG TBEC Take 1 tablet by mouth daily.    . furosemide (LASIX) 20 MG tablet Take 1 tablet (20 mg total) by mouth daily. 30 tablet   . gabapentin (NEURONTIN) 300 MG capsule Take 300 mg by mouth at bedtime.    Marland Kitchen ipratropium-albuterol (DUONEB) 0.5-2.5 (3) MG/3ML SOLN Take 3 mLs by nebulization 3 (three) times daily.    . Melatonin 5 MG TABS Take 5 mg by mouth at bedtime.    . Multiple Vitamin (MULTIVITAMIN WITH MINERALS) TABS tablet Take 1 tablet by mouth daily.    . Multiple Vitamins-Minerals (CENTROVITE) TABS Take 1 tablet by mouth daily.    Marland Kitchen nystatin (NYSTATIN) powder Apply topically 2 (two) times daily as needed. APPLY UNDER NECK TWICE DAILY AS NEEDED FOR YEAST    . omeprazole (PRILOSEC OTC) 20 MG tablet Take 20 mg by mouth 2 (two) times daily.    Marland Kitchen  PARoxetine (PAXIL) 30 MG tablet Take 30 mg by mouth daily.     Marland Kitchen senna (SENOKOT) 8.6 MG TABS tablet Take 1 tablet by mouth daily.    . traZODone (DESYREL) 50 MG tablet Take 25 mg by mouth at bedtime.    . carvedilol (COREG) 3.125 MG tablet Take 1 tablet (3.125 mg total) by mouth 2 (two) times daily with a meal. (Patient not taking: Reported on 08/19/2018) 60 tablet 0   No current facility-administered medications for this visit.     Past Medical History:  Diagnosis Date  . Atrial fibrillation (HCC)   . Empyema [place])    in [facility] Aug 2017- stayed in hospital on for 2 months.  . Hypertension     Past Surgical History:  Procedure Laterality Date  . EMBOLIZATION N/A 11/03/2018   Procedure: EMBOLIZATION;  Surgeon: Annice Needy, MD;  Location: [facility] INVASIVE CV LAB;  Service: Cardiovascular;  Laterality: N/A;  . ERCP N/A 08/23/2018   Procedure: ENDOSCOPIC RETROGRADE CHOLANGIOPANCREATOGRAPHY (ERCP);  Surgeon: Midge Minium, MD;  Location: [facility] ENDOSCOPY;  Service: Endoscopy;  Laterality: N/A;  . PEG TUBE PLACEMENT      Social History Social History   Tobacco Use  . Smoking status: Former Smoker    Types: Cigarettes  . Smokeless tobacco: Never Used  . Tobacco comment: quit in 2001.  smoked for 30 years  Substance Use Topics  . Alcohol use: No  . Drug use: No     Family History Family History  Problem Relation Age of Onset  . Hypertension Mother   . COPD Mother   . Lung cancer Father   . Liver cancer Sister     Allergies  Allergen Reactions  . Levaquin [Levofloxacin]   . Other Nausea And Vomiting  . Sulfa Antibiotics Hives  . Oxycodone Anxiety and Other (See Comments)     REVIEW OF SYSTEMS (Negative unless checked)  Constitutional: [] Weight loss  [] Fever  [] Chills Cardiac: [] Chest pain   [] Chest pressure   [] Palpitations   [] Shortness of breath when laying flat   [] Shortness of breath at rest   [] Shortness of breath with exertion. Vascular:  [] Pain in legs with walking    [] Pain in legs at rest   [] Pain in legs when laying flat   [] Claudication   [] Pain in feet when walking  [] Pain in feet at rest  [] Pain in feet when laying flat   [] History of DVT   [] Phlebitis   [] Swelling in legs   [] Varicose veins   [] Non-healing ulcers Pulmonary:   [] Uses home oxygen   [] Productive cough   [] Hemoptysis   [] Wheeze  [] COPD   [] Asthma Neurologic:  [] Dizziness  [] Blackouts   [] Seizures   [x] History of stroke   [] History of TIA  [] Aphasia   [] Temporary blindness   [] Dysphagia   [x] Weakness or numbness in arms   [x] Weakness or numbness in legs Musculoskeletal:  [x] Arthritis   [] Joint swelling   [x] Joint pain   [] Low back pain Hematologic:  [] Easy bruising  [] Easy bleeding   [] Hypercoagulable state   [] Anemic   Gastrointestinal:  [] Blood in stool   [] Vomiting blood  [x] Gastroesophageal reflux/heartburn   [] Abdominal pain Genitourinary:  [x] Chronic kidney disease   [] Difficult urination  [] Frequent urination  [] Burning with urination   [x] Hematuria Skin:  [] Rashes   [] Ulcers   [] Wounds Psychological:  [] History of anxiety   []  History of major depression.  Physical Examination  BP (!) 166/77 (BP Location: Right Arm)   Pulse 85   Resp 16   Ht 5\' 3"  (1.6 m)   Wt 242 lb 9.6 oz (110 kg)   BMI 42.97 kg/m  Gen:  WD/WN, NAD.  [place]: [place]/AT, No temporalis wasting. Ear/Nose/Throat: Hearing grossly intact, nares w/o erythema or drainage Eyes: Conjunctiva clear. Sclera non-icteric Neck: Supple.  Trachea midline Pulmonary:  Good air movement, no use of accessory muscles.  Cardiac: irregular Vascular:  Vessel Right Left  Radial Palpable Palpable                          PT Palpable Palpable  DP Palpable Palpable   Gastrointestinal: soft, nondistended, mild right sided tenderness Musculoskeletal: Left hemiparesis.  Being brought in a stretcher. Neurologic: Sensation grossly intact in extremities.  Left-sided weakness speech is fluent.  Psychiatric: Judgment intact, Mood  & affect appropriate for pt's clinical situation. Dermatologic: No rashes or ulcers noted.  No cellulitis or open wounds.       Labs Recent Results (from the past 2160 hour(s))  CBC with Differential     Status: Abnormal   Collection Time: 11/02/18  6:56 PM  Result Value Ref Range   WBC 35.9 (H) 4.0 - 10.5 K/uL   RBC 3.23 (L) 3.87 - 5.11 MIL/uL   Hemoglobin 8.8 (L) 12.0 - 15.0 g/dL   HCT 27.0 (  L) 36.0 - 46.0 %   MCV 94.7 80.0 - 100.0 fL   MCH 27.2 26.0 - 34.0 pg   MCHC 28.8 (L) 30.0 - 36.0 g/dL   RDW 01.0 27.2 - 53.6 %   Platelets 447 (H) 150 - 400 K/uL   nRBC 0.1 0.0 - 0.2 %   Neutrophils Relative % 85 %   Neutro Abs 30.5 (H) 1.7 - 7.7 K/uL   Lymphocytes Relative 8 %   Lymphs Abs 2.9 0.7 - 4.0 K/uL   Monocytes Relative 4 %   Monocytes Absolute 1.2 (H) 0.1 - 1.0 K/uL   Eosinophils Relative 0 %   Eosinophils Absolute 0.0 0.0 - 0.5 K/uL   Basophils Relative 0 %   Basophils Absolute 0.1 0.0 - 0.1 K/uL   WBC Morphology MORPHOLOGY UNREMARKABLE    RBC Morphology MORPHOLOGY UNREMARKABLE    Smear Review Normal platelet morphology    Immature Granulocytes 3 %   Abs Immature Granulocytes 1.17 (H) 0.00 - 0.07 K/uL    Comment: Performed at [facility], [address] [place], [place] [address]  Protime-INR     Status: Abnormal   Collection Time: 11/02/18  6:56 PM  Result Value Ref Range   Prothrombin Time 31.2 (H) 11.4 - 15.2 seconds   INR 3.06     Comment: Performed at [facility], [address] [place], [place] [address]  Urinalysis, Complete w Microscopic     Status: Abnormal   Collection Time: 11/02/18  6:56 PM  Result Value Ref Range   Color, Urine YELLOW (A) YELLOW   APPearance CLOUDY (A) CLEAR   Specific Gravity, Urine 1.020 1.005 - 1.030   pH 5.0 5.0 - 8.0   Glucose, UA NEGATIVE NEGATIVE mg/dL   Hgb urine dipstick MODERATE (A) NEGATIVE   Bilirubin Urine NEGATIVE NEGATIVE   Ketones, ur NEGATIVE NEGATIVE mg/dL   Protein, ur 30 (A) NEGATIVE  mg/dL   Nitrite NEGATIVE NEGATIVE   Leukocytes, UA TRACE (A) NEGATIVE   RBC / HPF 11-20 0 - 5 RBC/hpf   WBC, UA 11-20 0 - 5 WBC/hpf   Bacteria, UA RARE (A) NONE SEEN   Squamous Epithelial / LPF 0-5 0 - 5   Mucus PRESENT    Amorphous Crystal PRESENT     Comment: Performed at [facility], [address] [place], [place] [address]  Hepatic function panel     Status: Abnormal   Collection Time: 11/02/18  7:05 PM  Result Value Ref Range   Total Protein 7.0 6.5 - 8.1 g/dL   Albumin 3.3 (L) 3.5 - 5.0 g/dL   AST 756 (H) 15 - 41 U/L   ALT 83 (H) 0 - 44 U/L   Alkaline Phosphatase 83 38 - 126 U/L   Total Bilirubin 0.6 0.3 - 1.2 mg/dL   Bilirubin, Direct 0.1 0.0 - 0.2 mg/dL   Indirect Bilirubin 0.5 0.3 - 0.9 mg/dL    Comment: Performed at [facility], [address] [place], [place] [address]  Lipase, blood     Status: None   Collection Time: 11/02/18  7:05 PM  Result Value Ref Range   Lipase 18 11 - 51 U/L    Comment: Performed at [facility], [address] [place], [place] [address]  Basic metabolic panel     Status: Abnormal   Collection Time: 11/02/18  7:05 PM  Result Value Ref Range   Sodium 135 135 - 145 mmol/L   Potassium 4.8 3.5 -  5.1 mmol/L   [place] 93 (L) 98 - 111 mmol/L   CO2 23 22 - 32 mmol/L   Glucose, Bld 241 (H) 70 - 99 mg/dL   BUN 30 (H) 8 - 23 mg/dL   Creatinine, Ser 1.61 (H) 0.44 - 1.00 mg/dL   Calcium 8.6 (L) 8.9 - 10.3 mg/dL   GFR calc non Af Amer 14 (L) >60 mL/min   GFR calc Af Amer 16 (L) >60 mL/min   Anion gap 19 (H) 5 - 15    Comment: Performed at [facility], [address] [place], [place] [address]  Culture, blood (Routine x 2)     Status: None   Collection Time: 11/02/18  7:06 PM  Result Value Ref Range   Specimen Description BLOOD LEFT ANTECUBITAL    Special Requests      BOTTLES DRAWN AEROBIC AND ANAEROBIC Blood Culture adequate volume   Culture      NO GROWTH 5 DAYS Performed at [facility], [address] [place], [place] [address]    Report Status 11/07/2018 FINAL   CG4 I-STAT (Lactic acid)     Status: Abnormal   Collection Time: 11/02/18  7:09 PM  Result Value Ref Range   Lactic Acid, Venous 8.39 (HH) 0.5 - 1.9 mmol/L   Comment NOTIFIED PHYSICIAN   Lactic acid, plasma     Status: Abnormal   Collection Time: 11/02/18 10:35 PM  Result Value Ref Range   Lactic Acid, Venous 4.8 (HH) 0.5 - 1.9 mmol/L    Comment: CRITICAL RESULT CALLED TO, READ BACK BY AND VERIFIED WITH IMMA EGWUATU AT 2356 11/02/2018.  TFK Performed at [facility], [address] [place], [place] [address]   Glucose, capillary     Status: Abnormal   Collection Time: 11/02/18 11:35 PM  Result Value Ref Range   Glucose-Capillary 113 (H) 70 - 99 mg/dL   Comment 1 Notify RN   MRSA PCR Screening     Status: None   Collection Time: 11/02/18 11:43 PM  Result Value Ref Range   MRSA by PCR NEGATIVE NEGATIVE    Comment:        The GeneXpert MRSA Assay (FDA approved for NASAL specimens only), is one component of a comprehensive MRSA colonization surveillance program. It is not intended to diagnose MRSA infection nor to guide or monitor treatment for MRSA infections. Performed at [facility], [address] [place], [place] [address]   Culture, blood (Routine x 2)     Status: None   Collection Time: 11/03/18 12:38 AM  Result Value Ref Range   Specimen Description BLOOD RIGHT ANTECUBITAL    Special Requests      BOTTLES DRAWN AEROBIC AND ANAEROBIC Blood Culture results may not be optimal due to an excessive volume of blood received in culture bottles   Culture      NO GROWTH 5 DAYS Performed at [facility], [address], [place], [place] [address]    Report Status 11/08/2018 FINAL   Hemoglobin and hematocrit, blood     Status: Abnormal   Collection Time: 11/03/18 12:39 AM  Result Value Ref Range   Hemoglobin 7.5 (L) 12.0 - 15.0 g/dL   HCT 86.5 (L) 78.4 -  46.0 %    Comment: Performed at [facility], [address] [place], [place] [address]  Lactic acid, plasma     Status: Abnormal   Collection Time: 11/03/18  2:35 AM  Result Value Ref Range  Lactic Acid, Venous 3.0 (HH) 0.5 - 1.9 mmol/L    Comment: CRITICAL RESULT CALLED TO, READ BACK BY AND VERIFIED WITH IMMA EGWUATU AT 8250 11/03/2018.  TFK Performed at [facility], [address] [place], [place] [address]   Basic metabolic panel     Status: Abnormal   Collection Time: 11/03/18  4:10 AM  Result Value Ref Range   Sodium 136 135 - 145 mmol/L   Potassium 5.0 3.5 - 5.1 mmol/L   [place] 100 98 - 111 mmol/L   CO2 25 22 - 32 mmol/L   Glucose, Bld 162 (H) 70 - 99 mg/dL   BUN 35 (H) 8 - 23 mg/dL   Creatinine, Ser 7.34 (H) 0.44 - 1.00 mg/dL   Calcium 7.7 (L) 8.9 - 10.3 mg/dL   GFR calc non Af Amer 14 (L) >60 mL/min   GFR calc Af Amer 16 (L) >60 mL/min   Anion gap 11 5 - 15    Comment: Performed at [facility], [address] [place], [place] [address]  CBC     Status: Abnormal   Collection Time: 11/03/18  4:10 AM  Result Value Ref Range   WBC 41.4 (H) 4.0 - 10.5 K/uL   RBC 2.47 (L) 3.87 - 5.11 MIL/uL   Hemoglobin 7.0 (L) 12.0 - 15.0 g/dL   HCT 02.4 (L) 09.7 - 35.3 %   MCV 91.5 80.0 - 100.0 fL   MCH 28.3 26.0 - 34.0 pg   MCHC 31.0 30.0 - 36.0 g/dL   RDW 29.9 24.2 - 68.3 %   Platelets 345 150 - 400 K/uL   nRBC 0.1 0.0 - 0.2 %    Comment: Performed at [facility], [address] [place], [place] [address]  Protime-INR     Status: Abnormal   Collection Time: 11/03/18  4:10 AM  Result Value Ref Range   Prothrombin Time 15.9 (H) 11.4 - 15.2 seconds   INR 1.28     Comment: Performed at [facility], [address], [place], [place] [address]  Prepare RBC     Status: None   Collection Time: 11/03/18  4:24 AM  Result Value Ref Range   Order Confirmation      ORDER PROCESSED BY BLOOD BANK Performed at [facility],  [address] [place], [place] [address]   Glucose, capillary     Status: Abnormal   Collection Time: 11/03/18  4:31 AM  Result Value Ref Range   Glucose-Capillary 152 (H) 70 - 99 mg/dL  Procalcitonin     Status: None   Collection Time: 11/03/18  5:00 AM  Result Value Ref Range   Procalcitonin 1.09 ng/mL    Comment:        Interpretation: PCT > 0.5 ng/mL and <= 2 ng/mL: Systemic infection (sepsis) is possible, but other conditions are known to elevate PCT as well. (NOTE)       Sepsis PCT Algorithm           Lower Respiratory Tract                                      Infection PCT Algorithm    ----------------------------     ----------------------------         PCT < 0.25 ng/mL                PCT < 0.10 ng/mL  Strongly encourage             Strongly discourage   discontinuation of antibiotics    initiation of antibiotics    ----------------------------     -----------------------------       PCT 0.25 - 0.50 ng/mL            PCT 0.10 - 0.25 ng/mL               OR       >80% decrease in PCT            Discourage initiation of                                            antibiotics      Encourage discontinuation           of antibiotics    ----------------------------     -----------------------------         PCT >= 0.50 ng/mL              PCT 0.26 - 0.50 ng/mL                AND       <80% decrease in PCT             Encourage initiation of                                             antibiotics       Encourage continuation           of antibiotics    ----------------------------     -----------------------------        PCT >= 0.50 ng/mL                  PCT > 0.50 ng/mL               AND         increase in PCT                  Strongly encourage                                      initiation of antibiotics    Strongly encourage escalation           of antibiotics                                     -----------------------------                                            PCT <= 0.25 ng/mL                                                 OR                                        >  80% decrease in PCT                                     Discontinue / Do not initiate                                             antibiotics Performed at [facility], [address] [place], [place] [address]   Type and screen [facility]     Status: None   Collection Time: 11/03/18  5:05 AM  Result Value Ref Range   ABO/RH(D) A POS    Antibody Screen NEG    Sample Expiration 11/03/2018    Unit Number [id number]    Blood Component Type RBC LR PHER1    Unit division 00    Status of [facility]    Transfusion Status OK TO TRANSFUSE    Crossmatch Result Compatible    Unit Number [id number]    Blood Component Type RBC LR PHER2    Unit division 00    [facility]    Transfusion Status OK TO TRANSFUSE    Crossmatch Result Compatible    Unit Number [id number]    Blood Component Type RBC LR PHER2    Unit division 00    Status of Unit ISSUED,FINAL    Transfusion Status OK TO TRANSFUSE    Crossmatch Result      Compatible Performed at [facility], [address] [place], [place] [address]   BPAM RBC     Status: None   Collection Time: 11/03/18  5:05 AM  Result Value Ref Range   Blood Product Unit Number [id number]    Unit Type and Rh 6200    Blood Product Expiration Date 202001272359    ISSUE DATE / TIME [id number]    Blood Product Unit Number [id number]    PRODUCT CODE [id number]    Unit Type and Rh 0600    Blood Product Expiration Date 109323557322202001142359    ISSUE DATE / TIME [id number]    Blood Product Unit Number [id number]    PRODUCT CODE [id number]    Unit Type and Rh 6200    Blood Product Expiration Date 854627035009202001272359   Glucose, capillary     Status: Abnormal   Collection Time: 11/03/18  7:53 AM  Result Value Ref Range   Glucose-Capillary 127 (H) 70 - 99 mg/dL    ECHOCARDIOGRAM COMPLETE     Status: None   Collection Time: 11/03/18 10:46 AM  Result Value Ref Range   Weight 3,855.4 oz   Height 64 in   BP 114/65 mmHg  Glucose, capillary     Status: Abnormal   Collection Time: 11/03/18 11:47 AM  Result Value Ref Range   Glucose-Capillary 127 (H) 70 - 99 mg/dL  Hemoglobin     Status: Abnormal   Collection Time: 11/03/18  1:58 PM  Result Value Ref Range   Hemoglobin 7.6 (L) 12.0 - 15.0 g/dL    Comment: Performed at [facility], [address] [place], [place] [address]  Protime-INR     Status: Abnormal   Collection Time: 11/03/18  1:58 PM  Result Value Ref Range   Prothrombin Time 15.7 (H) 11.4 - 15.2 seconds   INR 1.26  Comment: Performed at [facility], [address] [place], [place] [address]  Hemoglobin A1c     Status: None   Collection Time: 11/03/18  1:58 PM  Result Value Ref Range   Hgb A1c MFr Bld 5.5 4.8 - 5.6 %    Comment: (NOTE) Pre diabetes:          5.7%-6.4% Diabetes:              >6.4% Glycemic control for   <7.0% adults with diabetes    Mean Plasma Glucose 111.15 mg/dL    Comment: Performed at [facility], [address] [place], [place] [address]  Basic metabolic panel     Status: Abnormal   Collection Time: 11/03/18  3:00 PM  Result Value Ref Range   Sodium 137 135 - 145 mmol/L   Potassium 5.0 3.5 - 5.1 mmol/L   [place] 100 98 - 111 mmol/L   CO2 27 22 - 32 mmol/L   Glucose, Bld 132 (H) 70 - 99 mg/dL   BUN 36 (H) 8 - 23 mg/dL   Creatinine, Ser 0.98 (H) 0.44 - 1.00 mg/dL   Calcium 7.8 (L) 8.9 - 10.3 mg/dL   GFR calc non Af Amer 14 (L) >60 mL/min   GFR calc Af Amer 16 (L) >60 mL/min   Anion gap 10 5 - 15    Comment: Performed at [facility], [address] [place], [place] [address]  Glucose, capillary     Status: Abnormal   Collection Time: 11/03/18  4:28 PM  Result Value Ref Range   Glucose-Capillary 113 (H) 70 - 99 mg/dL  Glucose, capillary     Status: Abnormal    Collection Time: 11/03/18  7:32 PM  Result Value Ref Range   Glucose-Capillary 118 (H) 70 - 99 mg/dL  Culture, Urine     Status: Abnormal   Collection Time: 11/03/18  7:48 PM  Result Value Ref Range   Specimen Description URINE, RANDOM    Special Requests      Normal Performed at [facility], [address] [place], [place] [address]    Culture <10,000 COLONIES/mL INSIGNIFICANT GROWTH (A)    Report Status 11/05/2018 FINAL   Hemoglobin and hematocrit, blood     Status: Abnormal   Collection Time: 11/03/18  7:48 PM  Result Value Ref Range   Hemoglobin 6.7 (L) 12.0 - 15.0 g/dL   HCT 62.1 (L) 30.8 - 65.7 %    Comment: Performed at [facility], [address] [place], [place] [address]  Prepare RBC     Status: None   Collection Time: 11/03/18  8:34 PM  Result Value Ref Range   Order Confirmation      ORDER PROCESSED BY BLOOD BANK Performed at [facility], [address], [place], [place] [address]   Protime-INR     Status: Abnormal   Collection Time: 11/03/18  9:40 PM  Result Value Ref Range   Prothrombin Time 16.9 (H) 11.4 - 15.2 seconds   INR 1.39     Comment: Performed at [facility], [address] [place], [place] [address]  Hemoglobin and hematocrit, blood     Status: Abnormal   Collection Time: 11/03/18 11:14 PM  Result Value Ref Range   Hemoglobin 6.6 (L) 12.0 - 15.0 g/dL   HCT 40.1 (L) 02.7 - 25.3 %    Comment: Performed at [facility], [address], [place], [place] [address]  Type and  screen [facility]     Status: None   Collection Time: 11/03/18 11:15 PM  Result Value Ref Range   ABO/RH(D) A POS    Antibody Screen NEG    Sample Expiration 11/06/2018    Unit Number [id number]    Blood Component Type RBC LR PHER2    Unit division 00    Status of Unit ISSUED,FINAL    Transfusion Status OK TO TRANSFUSE    Crossmatch Result Compatible    Unit Number [id number]    Blood  Component Type RED CELLS,LR    Unit division 00    Status of Unit REL FROM [facility]    Transfusion Status OK TO TRANSFUSE    Crossmatch Result Compatible    Unit Number [id number]    Blood Component Type RBC LR PHER1    Unit division 00    Status of Unit REL FROM [facility]    Transfusion Status OK TO TRANSFUSE    Crossmatch Result      Compatible Performed at [facility], [address] [place], [place] [address]    Unit Number [id number]    Blood Component Type RED CELLS,LR    Unit division 00    Status of Unit ISSUED,FINAL    Transfusion Status OK TO TRANSFUSE    Crossmatch Result Compatible   BPAM RBC     Status: None   Collection Time: 11/03/18 11:15 PM  Result Value Ref Range   ISSUE DATE / TIME 644034742595    Blood Product Unit Number [id number]    PRODUCT CODE [id number]    Unit Type and Rh 0600    Blood Product Expiration Date 301601093235    Blood Product Unit Number [id number]    Unit Type and Rh 6200    Blood Product Expiration Date 237628315176    Blood Product Unit Number [id number]    Unit Type and Rh 6200    Blood Product Expiration Date 854627035009    ISSUE DATE / TIME 381829937169    Blood Product Unit Number [id number]    PRODUCT CODE [id number]    Unit Type and Rh 6200    Blood Product Expiration Date 242353614431   Glucose, capillary     Status: Abnormal   Collection Time: 11/04/18 12:29 AM  Result Value Ref Range   Glucose-Capillary 118 (H) 70 - 99 mg/dL   Comment 1 Document in Chart   Glucose, capillary     Status: Abnormal   Collection Time: 11/04/18  3:47 AM  Result Value Ref Range   Glucose-Capillary 109 (H) 70 - 99 mg/dL  Glucose, capillary     Status: None   Collection Time: 11/04/18  7:30 AM  Result Value Ref Range   Glucose-Capillary 98 70 - 99 mg/dL  Procalcitonin     Status: None   Collection Time: 11/04/18  8:55 AM  Result Value Ref Range   Procalcitonin 1.43 ng/mL    Comment:        Interpretation: PCT >  0.5 ng/mL and <= 2 ng/mL: Systemic infection (sepsis) is possible, but other conditions are known to elevate PCT as well. (NOTE)       Sepsis PCT Algorithm           Lower Respiratory Tract                                      Infection PCT Algorithm    ----------------------------     ----------------------------  PCT < 0.25 ng/mL                PCT < 0.10 ng/mL         Strongly encourage             Strongly discourage   discontinuation of antibiotics    initiation of antibiotics    ----------------------------     -----------------------------       PCT 0.25 - 0.50 ng/mL            PCT 0.10 - 0.25 ng/mL               OR       >80% decrease in PCT            Discourage initiation of                                            antibiotics      Encourage discontinuation           of antibiotics    ----------------------------     -----------------------------         PCT >= 0.50 ng/mL              PCT 0.26 - 0.50 ng/mL                AND       <80% decrease in PCT             Encourage initiation of                                             antibiotics       Encourage continuation           of antibiotics    ----------------------------     -----------------------------        PCT >= 0.50 ng/mL                  PCT > 0.50 ng/mL               AND         increase in PCT                  Strongly encourage                                      initiation of antibiotics    Strongly encourage escalation           of antibiotics                                     -----------------------------                                           PCT <= 0.25 ng/mL  OR                                        > 80% decrease in PCT                                     Discontinue / Do not initiate                                             antibiotics Performed at [facility], [address] [place], [place] [address]   Basic  metabolic panel     Status: Abnormal   Collection Time: 11/04/18  8:55 AM  Result Value Ref Range   Sodium 135 135 - 145 mmol/L   Potassium 4.4 3.5 - 5.1 mmol/L   Chloride 101 98 - 111 mmol/L   CO2 26 22 - 32 mmol/L   Glucose, Bld 111 (H) 70 - 99 mg/dL   BUN 39 (H) 8 - 23 mg/dL   Creatinine, Ser 6.042.98 (H) 0.44 - 1.00 mg/dL   Calcium 7.9 (L) 8.9 - 10.3 mg/dL   GFR calc non Af Amer 14 (L) >60 mL/min   GFR calc Af Amer 17 (L) >60 mL/min   Anion gap 8 5 - 15    Comment: Performed at [facility], [address] [place], [place] [address]  CBC     Status: Abnormal   Collection Time: 11/04/18  8:55 AM  Result Value Ref Range   WBC 29.1 (H) 4.0 - 10.5 K/uL   RBC 2.62 (L) 3.87 - 5.11 MIL/uL   Hemoglobin 7.5 (L) 12.0 - 15.0 g/dL   HCT 11.923.4 (L) 14.736.0 - 82.946.0 %   MCV 89.3 80.0 - 100.0 fL   MCH 28.6 26.0 - 34.0 pg   MCHC 32.1 30.0 - 36.0 g/dL   RDW 56.215.7 (H) 13.011.5 - 86.515.5 %   Platelets 246 150 - 400 K/uL   nRBC 0.1 0.0 - 0.2 %    Comment: Performed at [facility], [address] [place], [place] [address]  Protime-INR     Status: Abnormal   Collection Time: 11/04/18  8:55 AM  Result Value Ref Range   Prothrombin Time 16.9 (H) 11.4 - 15.2 seconds   INR 1.39     Comment: Performed at [facility], [address] [place], [place] [address]  Hemoglobin and hematocrit, blood     Status: Abnormal   Collection Time: 11/04/18 11:25 AM  Result Value Ref Range   Hemoglobin 7.2 (L) 12.0 - 15.0 g/dL   HCT 84.122.7 (L) 32.436.0 - 40.146.0 %    Comment: Performed at [facility], [address] [place], [place] [address]  Glucose, capillary     Status: None   Collection Time: 11/04/18 11:36 AM  Result Value Ref Range   Glucose-Capillary 90 70 - 99 mg/dL  Prepare RBC     Status: None   Collection Time: 11/04/18  4:16 PM  Result Value Ref Range   Order Confirmation      ORDER PROCESSED BY BLOOD BANK Performed at [facility], [address]  [place], [place] [address]   Glucose, capillary  Status: None   Collection Time: 11/04/18  4:30 PM  Result Value Ref Range   Glucose-Capillary 80 70 - 99 mg/dL   Comment 1 Notify RN   Glucose, capillary     Status: None   Collection Time: 11/04/18  8:01 PM  Result Value Ref Range   Glucose-Capillary 94 70 - 99 mg/dL   Comment 1 Notify RN   Hemoglobin and hematocrit, blood     Status: Abnormal   Collection Time: 11/04/18 10:58 PM  Result Value Ref Range   Hemoglobin 8.1 (L) 12.0 - 15.0 g/dL   HCT 16.1 (L) 09.6 - 04.5 %    Comment: Performed at [facility], [address] [place], [place] [address]  Glucose, capillary     Status: None   Collection Time: 11/05/18 12:03 AM  Result Value Ref Range   Glucose-Capillary 90 70 - 99 mg/dL   Comment 1 Notify RN   Glucose, capillary     Status: None   Collection Time: 11/05/18  3:56 AM  Result Value Ref Range   Glucose-Capillary 88 70 - 99 mg/dL   Comment 1 Notify RN   Protime-INR     Status: Abnormal   Collection Time: 11/05/18  4:49 AM  Result Value Ref Range   Prothrombin Time 15.5 (H) 11.4 - 15.2 seconds   INR 1.24     Comment: Performed at [facility], [address] [place], [place] [address]  CBC     Status: Abnormal   Collection Time: 11/05/18  4:49 AM  Result Value Ref Range   WBC 24.0 (H) 4.0 - 10.5 K/uL   RBC 2.89 (L) 3.87 - 5.11 MIL/uL   Hemoglobin 8.2 (L) 12.0 - 15.0 g/dL   HCT 82.9 (L) 56.2 - 13.0 %   MCV 90.0 80.0 - 100.0 fL   MCH 28.4 26.0 - 34.0 pg   MCHC 31.5 30.0 - 36.0 g/dL   RDW 86.5 (H) 78.4 - 69.6 %   Platelets 233 150 - 400 K/uL   nRBC 0.2 0.0 - 0.2 %    Comment: Performed at [facility], [address] [place], [place] [address]  Basic metabolic panel     Status: Abnormal   Collection Time: 11/05/18  4:49 AM  Result Value Ref Range   Sodium 138 135 - 145 mmol/L   Potassium 3.9 3.5 - 5.1 mmol/L   Chloride 105 98 - 111 mmol/L   CO2 26 22 - 32 mmol/L   Glucose, Bld 94  70 - 99 mg/dL   BUN 34 (H) 8 - 23 mg/dL   Creatinine, Ser 4.13 (H) 0.44 - 1.00 mg/dL   Calcium 8.0 (L) 8.9 - 10.3 mg/dL   GFR calc non Af Amer 19 (L) >60 mL/min   GFR calc Af Amer 22 (L) >60 mL/min   Anion gap 7 5 - 15    Comment: Performed at [facility], [address] [place], [place] [address]  Glucose, capillary     Status: None   Collection Time: 11/05/18  8:35 AM  Result Value Ref Range   Glucose-Capillary 78 70 - 99 mg/dL   Comment 1 Notify RN   Glucose, capillary     Status: None   Collection Time: 11/05/18 12:15 PM  Result Value Ref Range   Glucose-Capillary 71 70 - 99 mg/dL   Comment 1 Notify RN   Glucose, capillary     Status: None   Collection Time: 11/05/18  4:59 PM  Result Value Ref Range   Glucose-Capillary 76 70 - 99 mg/dL   Comment 1 Notify RN   Glucose, capillary     Status: None   Collection Time: 11/05/18  8:03 PM  Result Value Ref Range   Glucose-Capillary 95 70 - 99 mg/dL   Comment 1 Notify RN   Glucose, capillary     Status: None   Collection Time: 11/05/18  8:22 PM  Result Value Ref Range   Glucose-Capillary 95 70 - 99 mg/dL  Glucose, capillary     Status: None   Collection Time: 11/06/18 12:05 AM  Result Value Ref Range   Glucose-Capillary 88 70 - 99 mg/dL   Comment 1 Notify RN   Glucose, capillary     Status: None   Collection Time: 11/06/18  4:03 AM  Result Value Ref Range   Glucose-Capillary 74 70 - 99 mg/dL   Comment 1 Notify RN   Glucose, capillary     Status: None   Collection Time: 11/06/18  7:50 AM  Result Value Ref Range   Glucose-Capillary 82 70 - 99 mg/dL   Comment 1 Notify RN   Basic metabolic panel     Status: Abnormal   Collection Time: 11/06/18  8:57 AM  Result Value Ref Range   Sodium 135 135 - 145 mmol/L   Potassium 3.9 3.5 - 5.1 mmol/L   Chloride 103 98 - 111 mmol/L   CO2 24 22 - 32 mmol/L   Glucose, Bld 84 70 - 99 mg/dL   BUN 26 (H) 8 - 23 mg/dL   Creatinine, Ser 4.78 (H) 0.44 - 1.00 mg/dL   Calcium 7.8  (L) 8.9 - 10.3 mg/dL   GFR calc non Af Amer 29 (L) >60 mL/min   GFR calc Af Amer 33 (L) >60 mL/min   Anion gap 8 5 - 15    Comment: Performed at [facility], [address] [place], [place] [address]  CBC     Status: Abnormal   Collection Time: 11/06/18  8:57 AM  Result Value Ref Range   WBC 17.5 (H) 4.0 - 10.5 K/uL   RBC 2.93 (L) 3.87 - 5.11 MIL/uL   Hemoglobin 8.3 (L) 12.0 - 15.0 g/dL   HCT 13.0 (L) 86.5 - 78.4 %   MCV 91.1 80.0 - 100.0 fL   MCH 28.3 26.0 - 34.0 pg   MCHC 31.1 30.0 - 36.0 g/dL   RDW 69.6 (H) 29.5 - 28.4 %   Platelets 248 150 - 400 K/uL   nRBC 0.1 0.0 - 0.2 %    Comment: Performed at [facility], [address] [place], [place] [address]  Glucose, capillary     Status: None   Collection Time: 11/06/18 12:07 PM  Result Value Ref Range   Glucose-Capillary 85 70 - 99 mg/dL   Comment 1 Notify RN   Glucose, capillary     Status: None   Collection Time: 11/06/18  5:01 PM  Result Value Ref Range   Glucose-Capillary 91 70 - 99 mg/dL   Comment 1 Notify RN   Glucose, capillary     Status: None   Collection Time: 11/06/18  8:05 PM  Result Value Ref Range   Glucose-Capillary 89 70 - 99 mg/dL   Comment 1 Notify RN   Glucose, capillary     Status: None   Collection Time: 11/07/18 12:31 AM  Result Value Ref Range   Glucose-Capillary 99 70 - 99 mg/dL   Comment 1 Notify  RN   CBC     Status: Abnormal   Collection Time: 11/07/18  3:53 AM  Result Value Ref Range   WBC 14.4 (H) 4.0 - 10.5 K/uL   RBC 3.00 (L) 3.87 - 5.11 MIL/uL   Hemoglobin 8.4 (L) 12.0 - 15.0 g/dL   HCT 16.1 (L) 09.6 - 04.5 %   MCV 90.7 80.0 - 100.0 fL   MCH 28.0 26.0 - 34.0 pg   MCHC 30.9 30.0 - 36.0 g/dL   RDW 40.9 (H) 81.1 - 91.4 %   Platelets 235 150 - 400 K/uL   nRBC 0.0 0.0 - 0.2 %    Comment: Performed at [facility], [address] [place], [place] [address]  Glucose, capillary     Status: Abnormal   Collection Time: 11/07/18  4:03 AM  Result Value Ref  Range   Glucose-Capillary 100 (H) 70 - 99 mg/dL   Comment 1 Notify RN   Glucose, capillary     Status: None   Collection Time: 11/07/18  8:01 AM  Result Value Ref Range   Glucose-Capillary 96 70 - 99 mg/dL   Comment 1 Notify RN   Glucose, capillary     Status: None   Collection Time: 11/07/18 11:47 AM  Result Value Ref Range   Glucose-Capillary 98 70 - 99 mg/dL   Comment 1 Notify RN   Glucose, capillary     Status: None   Collection Time: 11/07/18  5:17 PM  Result Value Ref Range   Glucose-Capillary 97 70 - 99 mg/dL  Glucose, capillary     Status: Abnormal   Collection Time: 11/07/18  8:07 PM  Result Value Ref Range   Glucose-Capillary 110 (H) 70 - 99 mg/dL  Glucose, capillary     Status: Abnormal   Collection Time: 11/08/18 12:00 AM  Result Value Ref Range   Glucose-Capillary 125 (H) 70 - 99 mg/dL  Glucose, capillary     Status: Abnormal   Collection Time: 11/08/18  3:59 AM  Result Value Ref Range   Glucose-Capillary 100 (H) 70 - 99 mg/dL  Basic metabolic panel     Status: Abnormal   Collection Time: 11/08/18  5:52 AM  Result Value Ref Range   Sodium 137 135 - 145 mmol/L   Potassium 3.5 3.5 - 5.1 mmol/L   Chloride 101 98 - 111 mmol/L   CO2 28 22 - 32 mmol/L   Glucose, Bld 108 (H) 70 - 99 mg/dL   BUN 19 8 - 23 mg/dL   Creatinine, Ser 6.21 (H) 0.44 - 1.00 mg/dL   Calcium 8.0 (L) 8.9 - 10.3 mg/dL   GFR calc non Af Amer 34 (L) >60 mL/min   GFR calc Af Amer 40 (L) >60 mL/min   Anion gap 8 5 - 15    Comment: Performed at [facility], [address] [place], [place] [address]  CBC     Status: Abnormal   Collection Time: 11/08/18  5:52 AM  Result Value Ref Range   WBC 15.4 (H) 4.0 - 10.5 K/uL   RBC 3.19 (L) 3.87 - 5.11 MIL/uL   Hemoglobin 8.9 (L) 12.0 - 15.0 g/dL   HCT 78.4 (L) 69.6 - 29.5 %   MCV 91.8 80.0 - 100.0 fL   MCH 27.9 26.0 - 34.0 pg   MCHC 30.4 30.0 - 36.0 g/dL   RDW 28.4 (H) 13.2 - 44.0 %   Platelets 270 150 - 400 K/uL   nRBC 0.0 0.0 - 0.2 %  Comment: Performed at [facility], [address] [place], [place] [address]  Glucose, capillary     Status: None   Collection Time: 11/08/18  8:33 AM  Result Value Ref Range   Glucose-Capillary 98 70 - 99 mg/dL   Comment 1 Notify RN   Glucose, capillary     Status: None   Collection Time: 11/08/18 11:40 AM  Result Value Ref Range   Glucose-Capillary 93 70 - 99 mg/dL   Comment 1 Notify RN     Radiology Vas US Renal Artery Duplex  Result Date: 12/13/2018 ABDOMINAL VISCERAL Vascular Interventions: 11/03/2018 Right kidney embolization. Limitations: Air/bowel gas, obesity, patient discomfort and Patient on ambulance stretcher. Unable to move or rotate. Performing Technologist: Reece Agar RT (R)(VS)  Examination Guidelines: A complete evaluation includes B-mode imaging, spectral Doppler, color Doppler, and power Doppler as needed of all accessible portions of each vessel. Bilateral testing is considered an integral part of a complete examination. Limited examinations for reoccurring indications may be performed as noted.  Duplex Findings: Technologist observations Renal Artery(s):Limited to right kidney only due to patient immobility.  +------------------+-----+------------------++ Right Kidney           Left Kidney        +------------------+-----+------------------++ RAR                    RAR                +------------------+-----+------------------++ RAR (manual)           RAR (manual)       +------------------+-----+------------------++ Cortex                 Cortex             +------------------+-----+------------------++ Cortex thickness       Corex thickness    +------------------+-----+------------------++ Kidney length (cm)10.80Kidney length (cm) +------------------+-----+------------------++  Summary: Renal:  Right: No flow visualized within the right kidney. Left:  Left kidney not scanned due to mobility.  *See table(s) above for measurements  and observations.  Diagnosing physician: Festus Barren MD  Electronically signed by Festus Barren MD on 12/13/2018 at 5:02:32 PM.    Final     Assessment/Plan  Severe sepsis [facility]) Has now recovered from multiple bouts of sepsis.  No current signs of infection.  HTN (hypertension) blood pressure control important in reducing the progression of atherosclerotic disease. On appropriate oral medications.   Renal hemorrhage, right Duplex is performed today which showed no flow in the right kidney which would be expected and the left kidney was unable to be adequately visualized given the patient's mobility and intolerance to the procedure.  No further recommendation or treatment from a vascular point of view.  Can return to clinic as needed    Festus Barren, MD  12/13/2018 5:16 PM    This note was created with Dragon medical transcription system.  Any errors from dictation are purely unintentional

## 2018-12-13 NOTE — Assessment & Plan Note (Signed)
Has now recovered from multiple bouts of sepsis.  No current signs of infection.

## 2018-12-13 NOTE — Assessment & Plan Note (Signed)
Duplex is performed today which showed no flow in the right kidney which would be expected and the left kidney was unable to be adequately visualized given the patient's mobility and intolerance to the procedure.  No further recommendation or treatment from a vascular point of view.  Can return to clinic as needed

## 2018-12-13 NOTE — Assessment & Plan Note (Signed)
blood pressure control important in reducing the progression of atherosclerotic disease. On appropriate oral medications.  

## 2019-01-13 ENCOUNTER — Non-Acute Institutional Stay: Payer: Medicare Other | Admitting: Primary Care

## 2019-01-13 ENCOUNTER — Other Ambulatory Visit: Payer: Self-pay

## 2019-01-13 DIAGNOSIS — R5381 Other malaise: Secondary | ICD-10-CM

## 2019-01-13 DIAGNOSIS — R0602 Shortness of breath: Secondary | ICD-10-CM

## 2019-01-13 DIAGNOSIS — Z515 Encounter for palliative care: Secondary | ICD-10-CM

## 2019-01-13 NOTE — Progress Notes (Signed)
Therapist, nutritional Palliative Care Consult Note Telephone: [phone]  Fax: [phone]  PATIENT NAME: Mikayla [name] DOB: 1941-02-06 MRN: [id number]  PRIMARY CARE PROVIDER:Slade-Hartman, Alvino Chapel, MD  REFERRING PROVIDER: Keane Police, MD [phone] Mikayla [name] [address]  RESPONSIBLE PARTY:   Extended Emergency Contact Information Primary Emergency Contact: Atkinson,[name] Address: [address]          [place], [place] [address] D[place] of [place] Home Phone: [phone] Relation: Daughter Secondary Emergency Contact: Buesing,[name] Address: [address]          [place], [place] [address] [place] of [place] Home Phone: [phone] Relation: Daughter   ASSESSMENT RECOMMENDATIONS:   1.Goals of Care: Pt currently DNR, has limited MOST. Will review with POA if able to reach. Patient visited in SNF, states she is feeling ok but asked to be put back to bed. States she gets OOB with lift daily. Appears frail obese with breathing effort. States permission for NP to call daughter with report. Called and no answer to [place], message left.   2. Debility R53.81 secondary to Emphysema progressive, Encourage passive rom; encourage to transfer to geri-chair.   3. Dyspneic R06.00 secondary to Emphysema remains stable at present time. Continue O2 as needed for dyspnea resolution.  Palliative care will continue to follow for goals of care clarification, symptom management.  Return 4-6 weeks or prn.   I spent 15 minutes providing this consultation,  from 0900 to 0915. More than 50% of the time in this consultation was spent coordinating communication.   HISTORY OF PRESENT ILLNESS:  Mikayla [name] is a 78 y.o. year old female with multiple medical problems including Obesity, COPD, oxygen use, debility, a fib. Palliative Care was asked to help address goals of care.   CODE STATUS: DNR  PPS: 30% HOSPICE  ELIGIBILITY/DIAGNOSIS: TBD  PAST MEDICAL HISTORY:  Past Medical History:  Diagnosis Date  . Atrial fibrillation (HCC)   . Empyema [facility])    in [facility] Aug 2017- stayed in hospital on for 2 months.  . Hypertension     SOCIAL HX:  Social History   Tobacco Use  . Smoking status: Former Smoker    Types: Cigarettes  . Smokeless tobacco: Never Used  . Tobacco comment: quit in 2001. smoked for 30 years  Substance Use Topics  . Alcohol use: No    ALLERGIES:  Allergies  Allergen Reactions  . Levaquin [Levofloxacin]   . Other Nausea And Vomiting  . Sulfa Antibiotics Hives  . Oxycodone Anxiety and Other (See Comments)     PERTINENT MEDICATIONS:  Outpatient Encounter Medications as of 01/13/2019  Medication Sig  . acetaminophen (TYLENOL) 325 MG tablet Take 650 mg by mouth every 4 (four) hours as needed for mild pain or fever.  Marland Kitchen amiodarone (PACERONE) 200 MG tablet Take 1 tablet (200 mg total) by mouth daily.  . carbidopa-levodopa (SINEMET IR) 10-100 MG tablet Take 1 tablet by mouth 3 (three) times daily.  . carvedilol (COREG) 3.125 MG tablet Take 1 tablet (3.125 mg total) by mouth 2 (two) times daily with a meal. (Patient not taking: Reported on 08/19/2018)  . ferrous sulfate 324 (65 Fe) MG TBEC Take 1 tablet by mouth daily.  . furosemide (LASIX) 20 MG tablet Take 1 tablet (20 mg total) by mouth daily.  Marland Kitchen gabapentin (NEURONTIN) 300 MG capsule Take 300 mg by mouth at bedtime.  Marland Kitchen ipratropium-albuterol (DUONEB) 0.5-2.5 (3) MG/3ML SOLN Take 3 mLs by nebulization 3 (three) times  daily.  . Melatonin 5 MG TABS Take 5 mg by mouth at bedtime.  . Multiple Vitamin (MULTIVITAMIN WITH MINERALS) TABS tablet Take 1 tablet by mouth daily.  . Multiple Vitamins-Minerals (CENTROVITE) TABS Take 1 tablet by mouth daily.  Marland Kitchen nystatin (NYSTATIN) powder Apply topically 2 (two) times daily as needed. APPLY UNDER NECK TWICE DAILY AS NEEDED FOR YEAST  . omeprazole (PRILOSEC OTC) 20 MG tablet Take 20 mg by mouth 2  (two) times daily.  Marland Kitchen PARoxetine (PAXIL) 30 MG tablet Take 30 mg by mouth daily.   Marland Kitchen senna (SENOKOT) 8.6 MG TABS tablet Take 1 tablet by mouth daily.  . traZODone (DESYREL) 50 MG tablet Take 25 mg by mouth at bedtime.   No facility-administered encounter medications on file as of 01/13/2019.     PHYSICAL EXAM:  Weights:  251, 245, 242 over past 4 months. Staff feels loss is mainly edema General: NAD, frail appearing,obese Cardiovascular: regular rate and rhythm, S1S2 Pulmonary: fine rales scattered in all fields, O2 in use at 2 liters, no cough, DOE with talking Abdomen: soft, nontender, + bowel sounds, incontinent GU: no suprapubic tenderness, no dysuria Extremities: no pedal  edema,  Skin: no rashes or lesions on gross exam Neurological: Weakness, non ambulatory, verbal and interactive  Marijo File DNP, AGPCNP-BC

## 2019-03-01 ENCOUNTER — Non-Acute Institutional Stay: Payer: Medicaid Other | Admitting: Primary Care

## 2019-03-01 ENCOUNTER — Other Ambulatory Visit: Payer: Self-pay

## 2019-03-01 DIAGNOSIS — Z515 Encounter for palliative care: Secondary | ICD-10-CM

## 2019-03-01 NOTE — Progress Notes (Signed)
Therapist, nutritional Palliative Care Consult Note Telephone: [phone]  Fax: [phone]  TELEHEALTH VISIT STATEMENT Due to the COVID-19 crisis, this visit was done via telemedicine from my office. It was initiated and consented to by this patient and/or family.  PATIENT NAME: Mikayla [name] DOB: 1941/03/05 MRN: [id number]  PRIMARY CARE PROVIDER:   Keane Police, MD  REFERRING PROVIDER:  Keane Police, MD [phone] [address] [place], [place] [address]  RESPONSIBLE PARTY:  Extended Emergency Contact Information Primary Emergency Contact: Buesing,[name] Address: [address]          [place], [place] [address] Darden Amber of [place] Home Phone: [phone] Relation: Daughter Secondary Emergency Contact: Atkinson,[name] Address: [address]          [place], [place] [address] Darden Amber of [place] Home Phone: [phone] Relation: Daughter  Palliative Care was asked to follow patient by consultation request of Dr. Keane Police, MD. This is a follow up visit. Patient was seen by me  by video telemedicine, with  Burnadette Pop RN and patient.   ASSESSMENT and RECOMMENDATIONS:   1. Dyspnea: Recommend  Inhaler form of albuterol/ipratropium (e.g. combivent) bid for chronic wheezing, with spacer. Pt now denies SOB, or on mild exertion. Appears comfortable in bed. Daughter in later conversation was concerned about nebulizer being changed from bid to prn. Prefer she have combivent 2-4x/day, using spacer for maximum intake due to chronic wheezing.  2. Pain: Continue with current regimen with prn dosing. Denies pain while in bed. Denies pain on getting OOB to chair.  3. Tremors and flat affect: Increase sinemet to 10/100 qid or increase to two tabs each dose, one dose at a time,  and titrate to effect. Hand resting tremors have worsened and patient is currently on 10/100 dosing tid.  4. Covid Isolation: Rec Tx  to assist with  video call with family.Please ask for a spot for video call to family. Pt misses her family who visited daily prior to covid. Daughters are [name] and [name] and pt would like video chat with family. Staff can help with this, pt has tablet. Family used to come to window daily but now patient is in an interior room.   5. Goals of Care: Remains DNR. Reached daughter [name] who is primary contact . She states her sister works for EMS and she [name] is primary contact. We discussed advance directives in light of covid and [name] said she and her sister will have  to consider what they would do for covid vs other disease processes. She appreciates the good care at [facility].   Palliative care will continue to follow for goals of care clarification, symptom management.  Return 4-6 weeks or prn.   I spent 35 minutes providing this consultation,  from 1230 to 1305. More than 50% of the time in this consultation was spent coordinating communication.   HISTORY OF PRESENT ILLNESS:  Mikayla [name] is a 78 y.o. year old female with multiple medical problems including Obesity, COPD, oxygen use, debility, a fib. Palliative Care was asked to help address goals of care.   CODE STATUS:  DNR- reviewed with daughter [name] who states she wants to keep this for now. She states she and her sister [name] will discuss any changes in light of covid should the need for hospitalization arise. Vynca upload attempted but could not upload due to tech complications.  PPS: 30% HOSPICE ELIGIBILITY/DIAGNOSIS: TBD  PAST MEDICAL HISTORY:  Past Medical History:  Diagnosis Date  .  Atrial fibrillation (HCC)   . Empyema [facility](HCC)    in [facility] Aug 2017- stayed in hospital on for 2 months.  . Hypertension     SOCIAL HX:  Social History   Tobacco Use  . Smoking status: Former Smoker    Types: Cigarettes  . Smokeless tobacco: Never Used  . Tobacco comment: quit in 2001. smoked for 30 years  Substance Use Topics  . Alcohol  use: No    ALLERGIES:  Allergies  Allergen Reactions  . Levaquin [Levofloxacin]   . Other Nausea And Vomiting  . Sulfa Antibiotics Hives  . Oxycodone Anxiety and Other (See Comments)     PERTINENT MEDICATIONS:  Outpatient Encounter Medications as of 03/01/2019  Medication Sig  . acetaminophen (TYLENOL) 325 MG tablet Take 650 mg by mouth every 4 (four) hours as needed for mild pain or fever.  Marland Kitchen. amiodarone (PACERONE) 200 MG tablet Take 1 tablet (200 mg total) by mouth daily.  . carbidopa-levodopa (SINEMET IR) 10-100 MG tablet Take 1 tablet by mouth 3 (three) times daily.  . carvedilol (COREG) 3.125 MG tablet Take 1 tablet (3.125 mg total) by mouth 2 (two) times daily with a meal. (Patient not taking: Reported on 08/19/2018)  . ferrous sulfate 324 (65 Fe) MG TBEC Take 1 tablet by mouth daily.  . furosemide (LASIX) 20 MG tablet Take 1 tablet (20 mg total) by mouth daily.  Marland Kitchen. gabapentin (NEURONTIN) 300 MG capsule Take 300 mg by mouth at bedtime.  Marland Kitchen. ipratropium-albuterol (DUONEB) 0.5-2.5 (3) MG/3ML SOLN Take 3 mLs by nebulization 3 (three) times daily.  . Melatonin 5 MG TABS Take 5 mg by mouth at bedtime.  . Multiple Vitamin (MULTIVITAMIN WITH MINERALS) TABS tablet Take 1 tablet by mouth daily.  . Multiple Vitamins-Minerals (CENTROVITE) TABS Take 1 tablet by mouth daily.  Marland Kitchen. nystatin (NYSTATIN) powder Apply topically 2 (two) times daily as needed. APPLY UNDER NECK TWICE DAILY AS NEEDED FOR YEAST  . omeprazole (PRILOSEC OTC) 20 MG tablet Take 20 mg by mouth 2 (two) times daily.  Marland Kitchen. PARoxetine (PAXIL) 30 MG tablet Take 30 mg by mouth daily.   Marland Kitchen. senna (SENOKOT) 8.6 MG TABS tablet Take 1 tablet by mouth daily.  . traZODone (DESYREL) 50 MG tablet Take 25 mg by mouth at bedtime.   No facility-administered encounter medications on file as of 03/01/2019.     PHYSICAL EXAM/ROS:  General: NAD, frail appearing, obese Cardiovascular: denies chest pain Pulmonary: breathing well,denies wheezes,  oxygen at  3 l/ [place], does not need extra oxygen  Abdomen: no constipation, appetite is good Extremities: no edema, weak extremities, tranfers with lift Skin: no rashes, wounds reported Neurological: Weakness, gets oob into recliner with lift. Encouraged daily time oob.  Marijo FileKathryn M Asuna Peth DNP, AGPCNP-BC

## 2019-03-29 ENCOUNTER — Non-Acute Institutional Stay: Payer: Medicaid Other | Admitting: Primary Care

## 2019-03-29 ENCOUNTER — Other Ambulatory Visit: Payer: Self-pay

## 2019-03-29 ENCOUNTER — Telehealth: Payer: Self-pay | Admitting: Primary Care

## 2019-03-29 DIAGNOSIS — Z515 Encounter for palliative care: Secondary | ICD-10-CM

## 2019-03-29 NOTE — Telephone Encounter (Signed)
T/c to POA [name] to discuss patient visit. She has concerns about covid but so far her mother has been negative. She thinks pt perhaps had it in Jan with an illness then.  She very much wants her mother to do well and stay healthy. Will continue to follow for goals of care clarification.

## 2019-03-29 NOTE — Progress Notes (Signed)
[facility] Consult Note Telephone: [phone]  Fax: [phone]  TELEHEALTH VISIT STATEMENT Due to the COVID-19 crisis, this visit was done via telemedicine from my office. It was initiated and consented to by this patient and/or family.  PATIENT NAME: Mikayla JohnsCatherine R Ebrahim DOB: 07/30/1941 MRN: [id number]  PRIMARY CARE PROVIDER:   Keane PoliceSlade-Hartman, Venezela, MD [phone]  REFERRING PROVIDER:  Keane PoliceSlade-Hartman, Venezela, MD [phone] [place], [place] [address]  [phone]   RESPONSIBLE PARTY:   Extended Emergency Contact Information Primary Emergency Contact: Buesing,[name] Address: [address]          [place], [place] [address] [place] Home Phone: [phone] Relation: Daughter Secondary Emergency Contact: Atkinson,[name] Address: [address]          [place], [place] [address] [place] Home Phone: [phone] Relation: Daughter  Palliative Care was asked to follow patient by consultation request of Keane PoliceSlade-Hartman, Venezela, MD. This is the follow up visit.  ASSESSMENT AND RECOMMENDATIONS:   1. Goals of Care: To maximize quality of life and wellbeing.   2. Symptom Management:    Dyspnea: Continue combivent tid, max dose is q 4 hrs if increase is  needed. Not doing any nebulizers as public health measure for Covid control.  Taking combivent tid. Has a spacer.Pt states breathing is good with increase in dosage, and also uses oxygen continually.   Pain: Continue with PRN protocol. Minimal, taking prn only. Denies pain, but knows about PRN.   Tremors: 10/100 increased to qid on 03/23/2019. Continue with this protocol. Tremors have improved with increase in sinemet dosing, monitor for any changes in tremors.  3. Family Supports: Daughters Rosalita ChessmanSuzanne and Rayfield CitizenCaroline keep in touch by phone and support patient. They share decision making.  4. Cognitive / Functional decline: States boredom, has  word searches and other pass times. Also does knitting. She is tired of confinement due to covid.  5. Advanced Care Directive: Remains DNR, [facility]. Will also t/c to daughters to introduce MOST and hopefully upload. They were going to discuss options.  6. Follow up Palliative Care Visit: Palliative care will continue to follow for goals of care clarification and symptom management. Return 6 weeks or prn.  I spent 25 minutes providing this consultation, from 1030 to 1055. More than 50% of the time in this consultation was spent coordinating communication.   HISTORY OF PRESENT ILLNESS:  Mikayla [name] is a 78 y.o. year old female with multiple medical problems including Obesity, COPD, oxygen use, debility, a fib. Palliative Care was asked to help address goals of care.   CODE STATUS: DNR  PPS: 30% HOSPICE ELIGIBILITY/DIAGNOSIS: TBD  PAST MEDICAL HISTORY:  Past Medical History:  Diagnosis Date  . Atrial fibrillation (HCC)   . Empyema [facility](HCC)    in [facility] Aug 2017- stayed in hospital on for 2 months.  . Hypertension     SOCIAL HX:  Social History   Tobacco Use  . Smoking status: Former Smoker    Types: Cigarettes  . Smokeless tobacco: Never Used  . Tobacco comment: quit in 2001. smoked for 30 years  Substance Use Topics  . Alcohol use: No    ALLERGIES:  Allergies  Allergen Reactions  . Levaquin [Levofloxacin]   . Other Nausea And Vomiting  . Sulfa Antibiotics Hives  . Oxycodone Anxiety and Other (See Comments)     PERTINENT MEDICATIONS:  Outpatient Encounter Medications as of 03/29/2019  Medication Sig  .  acetaminophen (TYLENOL) 325 MG tablet Take 650 mg by mouth every 4 (four) hours as needed for mild pain or fever.  Marland Kitchen amiodarone (PACERONE) 200 MG tablet Take 1 tablet (200 mg total) by mouth daily.  . carbidopa-levodopa (SINEMET IR) 10-100 MG tablet Take 1 tablet by mouth 3 (three) times daily.  . carvedilol (COREG) 3.125 MG tablet Take 1 tablet (3.125 mg  total) by mouth 2 (two) times daily with a meal. (Patient not taking: Reported on 08/19/2018)  . ferrous sulfate 324 (65 Fe) MG TBEC Take 1 tablet by mouth daily.  . furosemide (LASIX) 20 MG tablet Take 1 tablet (20 mg total) by mouth daily.  Marland Kitchen gabapentin (NEURONTIN) 300 MG capsule Take 300 mg by mouth at bedtime.  Marland Kitchen ipratropium-albuterol (DUONEB) 0.5-2.5 (3) MG/3ML SOLN Take 3 mLs by nebulization 3 (three) times daily.  . Melatonin 5 MG TABS Take 5 mg by mouth at bedtime.  . Multiple Vitamin (MULTIVITAMIN WITH MINERALS) TABS tablet Take 1 tablet by mouth daily.  . Multiple Vitamins-Minerals (CENTROVITE) TABS Take 1 tablet by mouth daily.  Marland Kitchen nystatin (NYSTATIN) powder Apply topically 2 (two) times daily as needed. APPLY UNDER NECK TWICE DAILY AS NEEDED FOR YEAST  . omeprazole (PRILOSEC OTC) 20 MG tablet Take 20 mg by mouth 2 (two) times daily.  Marland Kitchen PARoxetine (PAXIL) 30 MG tablet Take 30 mg by mouth daily.   Marland Kitchen senna (SENOKOT) 8.6 MG TABS tablet Take 1 tablet by mouth daily.  . traZODone (DESYREL) 50 MG tablet Take 25 mg by mouth at bedtime.   No facility-administered encounter medications on file as of 03/29/2019.     PHYSICAL EXAM/ROS:   VS: 97.8-70-20  133/71    Current and past weights: 242.6 lb current General: NAD, frail appearing, obese Cardiovascular: no chest pain reported, no edema,  Pulmonary: no cough, no increased SOB, 94% at 2 L oxygen Abdomen: appetite good, denies constipation GU: denies dysuria MSK:  OOB daily,  oob with lift, non wt bearing Skin: no rashes, open wound left buttock pressure injury,1 cm with clean bed. Neurological: Weakness, alert and oriented,sleep ok  Marijo File DNP, AGPCNP-BC

## 2019-04-19 ENCOUNTER — Other Ambulatory Visit (HOSPITAL_COMMUNITY): Payer: Self-pay | Admitting: Family Medicine

## 2019-04-23 LAB — NOVEL CORONAVIRUS, NAA: SARS-CoV-2, NAA: NOT DETECTED

## 2019-04-26 ENCOUNTER — Other Ambulatory Visit: Payer: Self-pay | Admitting: Family Medicine

## 2019-05-02 LAB — NOVEL CORONAVIRUS, NAA: SARS-CoV-2, NAA: NOT DETECTED

## 2019-05-03 ENCOUNTER — Non-Acute Institutional Stay: Payer: Medicaid Other | Admitting: Primary Care

## 2019-05-03 ENCOUNTER — Other Ambulatory Visit: Payer: Self-pay

## 2019-05-03 DIAGNOSIS — Z515 Encounter for palliative care: Secondary | ICD-10-CM

## 2019-05-03 NOTE — Progress Notes (Signed)
Designer, jewellery Palliative Care Consult Note Telephone: [phone]  Fax: [phone]  TELEHEALTH VISIT STATEMENT Due to the COVID-19 crisis, this visit was done via telemedicine from my office. It was initiated and consented to by this patient and/or family.  PATIENT NAME: Mikayla [name] DOB: 10-01-1941 MRN: [id number]  PRIMARY CARE PROVIDER:   Alvester Morin, [place]  REFERRING PROVIDER:  Alvester Morin, MD [address] [place],  [place] [address] [phone]  RESPONSIBLE PARTY:   Extended Emergency Contact Information Primary Emergency Contact: Buesing,[name] Address: [address]          [place], [place] [address] Johnnette Litter of [place] Phone: [phone] Relation: Daughter Secondary Emergency Contact: Atkinson,[name] Address: [address]          [place], [place] [address] Johnnette Litter of [place] Phone: [phone] Relation: Daughter  Palliative Care was asked to follow this patient by consultation request of Alvester Morin, MD. This is a follow up visit.  ASSESSMENT AND RECOMMENDATIONS:   1. Goals of Care: Maximize quality of life and symptom management.  2. Symptom Management:    Dyspnea: Oxygen 2 l, no increased dyspnea  Pain: No pain reported that cannot be Rx with PRN  Tremors: Increase in sinemet 4x/ day this has resolved, no complaints of tremors at this time.  3. Family /Caregiver/Community Supports: Resident of SNF for nursing care, has 2 daughters who are POA and attentive with visits and phone calls. Talked to daughter at length about her concerns and fears with Covid, her mother and family memories. She was grateful for the assistance with her care.   4. Cognitive / Functional decline: Stable cognitively although discouraged she can't visit with her family, even by window visits. Function is stable.  5. Advanced Care Directive: DNR  In [facility] and at SNF.  6.  Follow up Palliative Care Visit: Palliative care will continue to follow for goals of care clarification and symptom management. Return 4-6 weeks or prn.  I spent 35 minutes providing this consultation,  from 1200 to 1235. More than 50% of the time in this consultation was spent coordinating communication.   HISTORY OF PRESENT ILLNESS:  Mikayla [name] is a 78 y.o. year old female with multiple medical problems including Obesity, COPD, oxygen use, debility, a fib, tremors . Palliative Care was asked to help address goals of care.   CODE STATUS: DNR  PPS: 30% HOSPICE ELIGIBILITY/DIAGNOSIS: TBD  PAST MEDICAL HISTORY:  Past Medical History:  Diagnosis Date  . Atrial fibrillation ([place])   . Empyema [facility])    in [facility] Aug 2017- stayed in hospital on for 2 months.  . Hypertension     SOCIAL HX:  Social History   Tobacco Use  . Smoking status: Former Smoker    Types: Cigarettes  . Smokeless tobacco: Never Used  . Tobacco comment: quit in 2001. smoked for 30 years  Substance Use Topics  . Alcohol use: No    ALLERGIES:  Allergies  Allergen Reactions  . Levaquin [Levofloxacin]   . Other Nausea And Vomiting  . Sulfa Antibiotics Hives  . Oxycodone Anxiety and Other (See Comments)     PERTINENT MEDICATIONS:  Outpatient Encounter Medications as of 05/03/2019  Medication Sig  . acetaminophen (TYLENOL) 325 MG tablet Take 650 mg by mouth every 4 (four) hours as needed for mild pain or fever.  Marland Kitchen amiodarone (PACERONE) 200 MG tablet Take 1 tablet (200 mg total) by mouth daily.  . carbidopa-levodopa (  SINEMET IR) 10-100 MG tablet Take 1 tablet by mouth 3 (three) times daily.  . carvedilol (COREG) 3.125 MG tablet Take 1 tablet (3.125 mg total) by mouth 2 (two) times daily with a meal. (Patient not taking: Reported on 08/19/2018)  . ferrous sulfate 324 (65 Fe) MG TBEC Take 1 tablet by mouth daily.  . furosemide (LASIX) 20 MG tablet Take 1 tablet (20 mg total) by mouth daily.  Marland Kitchen. gabapentin  (NEURONTIN) 300 MG capsule Take 300 mg by mouth at bedtime.  Marland Kitchen. ipratropium-albuterol (DUONEB) 0.5-2.5 (3) MG/3ML SOLN Take 3 mLs by nebulization 3 (three) times daily.  . Melatonin 5 MG TABS Take 5 mg by mouth at bedtime.  . Multiple Vitamin (MULTIVITAMIN WITH MINERALS) TABS tablet Take 1 tablet by mouth daily.  . Multiple Vitamins-Minerals (CENTROVITE) TABS Take 1 tablet by mouth daily.  Marland Kitchen. nystatin (NYSTATIN) powder Apply topically 2 (two) times daily as needed. APPLY UNDER NECK TWICE DAILY AS NEEDED FOR YEAST  . omeprazole (PRILOSEC OTC) 20 MG tablet Take 20 mg by mouth 2 (two) times daily.  Marland Kitchen. PARoxetine (PAXIL) 30 MG tablet Take 30 mg by mouth daily.   Marland Kitchen. senna (SENOKOT) 8.6 MG TABS tablet Take 1 tablet by mouth daily.  . traZODone (DESYREL) 50 MG tablet Take 25 mg by mouth at bedtime.   No facility-administered encounter medications on file as of 05/03/2019.     PHYSICAL EXAM/ROS:   Current and past weights: 242 lbs General: NAD, Obese Cardiovascular: no chest pain reported, no edema,  Pulmonary: no cough, no increased SOB, no DOE Abdomen: appetite good,  Denies constipation, continent of bowel GU: denies dysuria, incontinent of urine MSK:  no joint deformities, transfers with pivot only Skin: no rashes or wounds reported Neurological: Weakness, non ambulatory   Marijo FileKathryn M Aysel Gilchrest DNP AGPCNP-BC

## 2019-05-11 ENCOUNTER — Telehealth: Payer: Self-pay

## 2019-05-11 NOTE — Telephone Encounter (Signed)
Pt.'s daughter given results, verbalizes understanding.

## 2019-05-29 ENCOUNTER — Encounter: Payer: Self-pay | Admitting: Primary Care

## 2019-05-29 NOTE — Telephone Encounter (Signed)
Erroneous phone call

## 2019-05-30 ENCOUNTER — Non-Acute Institutional Stay: Payer: Medicaid Other | Admitting: Primary Care

## 2019-05-30 ENCOUNTER — Other Ambulatory Visit: Payer: Self-pay

## 2019-05-30 DIAGNOSIS — Z515 Encounter for palliative care: Secondary | ICD-10-CM

## 2019-05-30 NOTE — Progress Notes (Signed)
Therapist[facility] Consult Note Telephone: [phone]  Fax: [phone]  TELEHEALTH VISIT STATEMENT Due to the COVID-19 crisis, this visit was done via telemedicine from my office. It was initiated and consented to by this patient and/or family.  PATIENT NAME: Mikayla [name] DOB: 10/19/1941 MRN: [id number]  PRIMARY CARE PROVIDER:   Keane PoliceSlade-Hartman, Venezela, MD [phone]   REFERRING PROVIDER:  Keane PoliceSlade-Hartman, Venezela, MD [phone] [place],  [place] [address] [phone]  RESPONSIBLE PARTY:   Extended Emergency Contact Information Primary Emergency Contact: Buesing,[name] Address: [address]          [place], [place] [address] [place] Home Phone: [phone] Relation: Daughter Secondary Emergency Contact: Atkinson,[name] Address: [address]          [place], [place] [address] [place] Home Phone: [phone] Relation: Daughter  Palliative Care was asked to follow this patient by consultation request of Keane PoliceSlade-Hartman, Venezela, MD. This is a follow up visit.  ASSESSMENT AND RECOMMENDATIONS:   1. Goals of Care: Maximize quality of life and symptom management.  2. Symptom Management:   Tremors: No report of problems, states she reads and plays bingo.  Pain: none reported, states comfort.  Nutrition: States family brings her outside food. She especially loves fried green tomatoes.  She is eating well, 20 lb weight loss since April, would like her re weighed to confirm. Will keep monitoring. This is 8% loss.  3. Family /Caregiver/Community Supports: Nursing home resident, family in area who visits via window, cannot come inside due to covid restrictions. This has been a sadness  for family and patient.   4. Cognitive / Functional decline: Stable cognitively, has moved rooms which has made her less depressed, she can see family on window visits now.  5. Advanced Care Directive: DNR  [facility].  6. Follow up Palliative Care Visit: Palliative care will continue to follow for goals of care clarification and symptom management. Return 4-6 weeks or prn.  I spent 25 minutes providing this consultation,  from 1030 to 1055. More than 50% of the time in this consultation was spent coordinating communication.   HISTORY OF PRESENT ILLNESS:  Mikayla [name] is a 78 y.o. year old female with multiple medical problems including Obesity, COPD, oxygen use, debility, a fib, tremors. Palliative Care was asked to help address goals of care.   CODE STATUS: DNR  PPS: 30% HOSPICE ELIGIBILITY/DIAGNOSIS: TBD  PAST MEDICAL HISTORY:  Past Medical History:  Diagnosis Date  . Atrial fibrillation (HCC)   . Empyema [facility](HCC)    in [facility] Aug 2017- stayed in hospital on for 2 months.  . Hypertension     SOCIAL HX:  Social History   Tobacco Use  . Smoking status: Former Smoker    Types: Cigarettes  . Smokeless tobacco: Never Used  . Tobacco comment: quit in 2001. smoked for 30 years  Substance Use Topics  . Alcohol use: No    ALLERGIES:  Allergies  Allergen Reactions  . Levaquin [Levofloxacin]   . Other Nausea And Vomiting  . Sulfa Antibiotics Hives  . Oxycodone Anxiety and Other (See Comments)     PERTINENT MEDICATIONS:  Outpatient Encounter Medications as of 05/30/2019  Medication Sig  . acetaminophen (TYLENOL) 325 MG tablet Take 650 mg by mouth every 4 (four) hours as needed for mild pain or fever.  Marland Kitchen. amiodarone (PACERONE) 200 MG tablet Take 1 tablet (200 mg total) by mouth daily.  .Marland Kitchen  carbidopa-levodopa (SINEMET IR) 10-100 MG tablet Take 1 tablet by mouth 3 (three) times daily.  . carvedilol (COREG) 3.125 MG tablet Take 1 tablet (3.125 mg total) by mouth 2 (two) times daily with a meal. (Patient not taking: Reported on 08/19/2018)  . ferrous sulfate 324 (65 Fe) MG TBEC Take 1 tablet by mouth daily.  . furosemide (LASIX) 20 MG tablet Take 1 tablet (20 mg total) by mouth  daily.  Marland Kitchen gabapentin (NEURONTIN) 300 MG capsule Take 300 mg by mouth at bedtime.  Marland Kitchen ipratropium-albuterol (DUONEB) 0.5-2.5 (3) MG/3ML SOLN Take 3 mLs by nebulization 3 (three) times daily.  . Melatonin 5 MG TABS Take 5 mg by mouth at bedtime.  . Multiple Vitamin (MULTIVITAMIN WITH MINERALS) TABS tablet Take 1 tablet by mouth daily.  . Multiple Vitamins-Minerals (CENTROVITE) TABS Take 1 tablet by mouth daily.  Marland Kitchen nystatin (NYSTATIN) powder Apply topically 2 (two) times daily as needed. APPLY UNDER NECK TWICE DAILY AS NEEDED FOR YEAST  . omeprazole (PRILOSEC OTC) 20 MG tablet Take 20 mg by mouth 2 (two) times daily.  Marland Kitchen PARoxetine (PAXIL) 30 MG tablet Take 30 mg by mouth daily.   Marland Kitchen senna (SENOKOT) 8.6 MG TABS tablet Take 1 tablet by mouth daily.  . traZODone (DESYREL) 50 MG tablet Take 25 mg by mouth at bedtime.   No facility-administered encounter medications on file as of 05/30/2019.     PHYSICAL EXAM/ROS:  98.1-74-18 145/78  Current and past weights: Reported 223 lb  today, 242 lb in April, 20 lb or 8 % weight loss since 01/2019 General: NAD, obese Cardiovascular: no chest pain reported, no edema,  Pulmonary: no cough, no increased SOB Abdomen: appetite good, denies constipation, continent of bowel GU: denies dysuria, incontinent of urine MSK:  no joint deformities, oob in recliner daily Skin: no rashes or wounds reported Neurological: Weakness,  oob daily with lift  Cyndia Skeeters DNP AGPCNP-BC

## 2019-05-31 ENCOUNTER — Non-Acute Institutional Stay: Payer: Medicare Other | Admitting: Primary Care

## 2019-07-18 ENCOUNTER — Other Ambulatory Visit: Payer: Self-pay

## 2019-07-18 ENCOUNTER — Non-Acute Institutional Stay: Payer: Medicaid Other | Admitting: Primary Care

## 2019-07-19 ENCOUNTER — Non-Acute Institutional Stay: Payer: Medicaid Other | Admitting: Primary Care

## 2019-07-19 ENCOUNTER — Other Ambulatory Visit: Payer: Self-pay

## 2019-07-19 DIAGNOSIS — Z515 Encounter for palliative care: Secondary | ICD-10-CM

## 2019-07-19 NOTE — Progress Notes (Signed)
Therapist, nutritional Palliative Care Consult Note Telephone: [phone]  Fax: [phone]  TELEHEALTH VISIT STATEMENT Due to the COVID-19 crisis, this visit was done via telemedicine from my office. It was initiated and consented to by this patient and/or family.  PATIENT NAME: Mikayla [name] [address] [address] [place] [address] [phone] (home)  DOB: 04/04/1941 MRN: [id number]  PRIMARY CARE PROVIDER:   Keane Police, MD, [phone] [place] [address] [phone]  REFERRING PROVIDER:  Keane Police, MD [phone] [place],  [place] [address] [phone]  RESPONSIBLE PARTY:   Extended Emergency Contact Information Primary Emergency Contact: Buesing,[name] Address: [address]          [place], [place] [address] Darden Amber of [place] Home Phone: [phone] Relation: Daughter Secondary Emergency Contact: Atkinson,[name] Address: [address]          [place], [place] [address] Darden Amber of [place] Home Phone: [phone] Relation: Daughter   ASSESSMENT AND RECOMMENDATIONS:   1. Advance Care Planning/Goals of Care: Goals include to maximize quality of life and symptom management. Documents on file.DNR, MOST still to be done with daughters.   2. Symptom Management:  Denies pain, other symptoms. Appears SOB on interview, use prn nebs for  Dyspnea as desired.  3. Family /Caregiver/Community Supports: Daughters in area, come daily to visit thru window./ Lives in LTC facility with covid visiting restrictions.  4. Cognitive / Functional decline: Appears at baseline although not as cheerful today as previous. She did not voice any problems. OOB with Michiel Sites to chair on most days. Otherwise bedbound.Gaining some weight back from previous loss.  5. Follow up Palliative Care Visit: Palliative care will continue to follow for goals of care clarification and symptom management. Return 6 weeks or prn.  I spent  15 minutes providing this consultation,  from 1330 to 1345. More than 50% of the time in this consultation was spent coordinating communication.   HISTORY OF PRESENT ILLNESS:  Mikayla [name] is a 78 y.o. year old female with multiple medical problems including Obesity, COPD, oxygen use, debility, a fib, tremors. . Palliative Care was asked to follow this patient by consultation request of Slade-Hartman, Alvino Chapel* to help address advance care planning and goals of care. This is a follow up visit.  CODE STATUS: DNR  PPS: 30% HOSPICE ELIGIBILITY/DIAGNOSIS: TBD  PAST MEDICAL HISTORY:  Past Medical History:  Diagnosis Date  . Atrial fibrillation (HCC)   . Empyema [facility])    in [facility] Aug 2017- stayed in hospital on for 2 months.  . Hypertension     SOCIAL HX:  Social History   Tobacco Use  . Smoking status: Former Smoker    Types: Cigarettes  . Smokeless tobacco: Never Used  . Tobacco comment: quit in 2001. smoked for 30 years  Substance Use Topics  . Alcohol use: No    ALLERGIES:  Allergies  Allergen Reactions  . Levaquin [Levofloxacin]   . Other Nausea And Vomiting  . Sulfa Antibiotics Hives  . Oxycodone Anxiety and Other (See Comments)     PERTINENT MEDICATIONS:  Outpatient Encounter Medications as of 07/19/2019  Medication Sig  . acetaminophen (TYLENOL) 325 MG tablet Take 650 mg by mouth every 4 (four) hours as needed for mild pain or fever.  Marland Kitchen amiodarone (PACERONE) 200 MG tablet Take 1 tablet (200 mg total) by mouth daily.  . carbidopa-levodopa (SINEMET IR) 10-100 MG tablet Take 1 tablet by mouth 3 (three) times daily.  Marland Kitchen  carvedilol (COREG) 3.125 MG tablet Take 1 tablet (3.125 mg total) by mouth 2 (two) times daily with a meal. (Patient not taking: Reported on 08/19/2018)  . ferrous sulfate 324 (65 Fe) MG TBEC Take 1 tablet by mouth daily.  . furosemide (LASIX) 20 MG tablet Take 1 tablet (20 mg total) by mouth daily.  Marland Kitchen gabapentin (NEURONTIN) 300 MG capsule Take 300 mg  by mouth at bedtime.  Marland Kitchen ipratropium-albuterol (DUONEB) 0.5-2.5 (3) MG/3ML SOLN Take 3 mLs by nebulization 3 (three) times daily.  . Melatonin 5 MG TABS Take 5 mg by mouth at bedtime.  . Multiple Vitamin (MULTIVITAMIN WITH MINERALS) TABS tablet Take 1 tablet by mouth daily.  . Multiple Vitamins-Minerals (CENTROVITE) TABS Take 1 tablet by mouth daily.  Marland Kitchen nystatin (NYSTATIN) powder Apply topically 2 (two) times daily as needed. APPLY UNDER NECK TWICE DAILY AS NEEDED FOR YEAST  . omeprazole (PRILOSEC OTC) 20 MG tablet Take 20 mg by mouth 2 (two) times daily.  Marland Kitchen PARoxetine (PAXIL) 30 MG tablet Take 30 mg by mouth daily.   Marland Kitchen senna (SENOKOT) 8.6 MG TABS tablet Take 1 tablet by mouth daily.  . traZODone (DESYREL) 50 MG tablet Take 25 mg by mouth at bedtime.   No facility-administered encounter medications on file as of 07/19/2019.     PHYSICAL EXAM / ROS:   Current and past weights: 227 lb now, has gained a few pounds General: NAD, frail appearing, obese Cardiovascular: no chest pain reported, no edema Pulmonary: no cough, no increased SOB, oxygen 2.5 L, DOE with speaking Abdomen: appetite good, endorses constipation, incontinent of bowel GU: denies dysuria, incontinent of urine MSK:  no joint deformities, non  Ambulatory, OOB to chair most days. Skin: no rashes or wounds reported Neurological: Weakness, alert and oriented x 2, family in to visit at window often.  Cyndia Skeeters NP

## 2019-09-14 ENCOUNTER — Other Ambulatory Visit: Payer: Self-pay

## 2019-09-14 ENCOUNTER — Non-Acute Institutional Stay: Payer: Medicaid Other | Admitting: Primary Care

## 2019-09-14 DIAGNOSIS — Z515 Encounter for palliative care: Secondary | ICD-10-CM

## 2019-09-14 NOTE — Progress Notes (Signed)
Designer, jewellery Palliative Care Consult Note Telephone: [phone]  Fax: [phone]  TELEHEALTH VISIT STATEMENT Due to the COVID-19 crisis, this visit was done via telemedicine from my office. It was initiated and consented to by this patient and/or family.  PATIENT NAME: Mikayla [name] [id number] [phone] (home)  DOB: [birth year] MRN: [id number]  PRIMARY CARE PROVIDER:   Alvester Morin, MD, [place] [address] [phone]  REFERRING PROVIDER:  Alvester Morin, MD [address]. [place],  [place] [address] [phone]  RESPONSIBLE PARTY:   Extended Emergency Contact Information Primary Emergency Contact: Buesing,[name] Address: [address]          [place], [place] [address] Johnnette Litter of [place] Phone: [phone] Relation: Daughter Secondary Emergency Contact: Atkinson,[name] Address: [address]          [place], [place] [address] Johnnette Litter of [place] Phone: [phone] Relation: Daughter   ASSESSMENT AND RECOMMENDATIONS:   1. Advance Care Planning/Goals of Care: Goals include to maximize quality of life and symptom management. DNR on file, Most on file at nursing home; Choices for DNR, limited, limited abx and IV, no feeding tube.   2. Symptom Management:   Pain:  I met with Mikayla [name] today by video. She had oxygen on and was conversant. She denied pain. She stated that she was overall very comfortable.   Mobility: She  did state she wanted to be out of bed in the chair daily. This was reiterated in a conversation with her daughter and POA. She wants to be up in a chair daily for several hours and then returned to bed after lunch where she can be changed and then rest for the afternoon. I encouraged her daughter to make this request known to the nursing home as well and perhaps to schedule a  care plan meeting. I am happy to assist in this  meeting if needed.   Dypsnea: Her shortness of breath is at its base line and she did not voice any complaints of dyspnea or labored breathing.  Tremors: Mikayla [name] did not exhibit tremors and has enjoyed recently doing some knitting and reading.   3. Family /Caregiver/Community Supports: Daughter Mikayla [name] is PR and another daughter is also involved. Lives in [facility] facility.   4. Cognitive / Functional decline: At baseline for cognitive and function. Desires to do more hand work and reading with improved tremor control.   5. Follow up Palliative Care Visit: Palliative care will continue to follow for goals of care clarification and symptom management. Return 4-6 weeks or prn.  I spent 35 minutes providing this consultation,  from 1030 to 1105 More than 50% of the time in this consultation was spent coordinating communication.   HISTORY OF PRESENT ILLNESS:  Mikayla [name] is a 78 y.o. year old female with multiple medical problems including Parkinsons, tremors, obesity, debility. Palliative Care was asked to follow this patient by consultation request of Slade-Hartman, Ivette Loyal* to help address advance care planning and goals of care. This is a follow up visit.  CODE STATUS: DNR on file, Most on file at nursing home; Choices for DNR, limited, limited abx and IV, no feeding tube.   PPS: 30% HOSPICE ELIGIBILITY/DIAGNOSIS: TBD  PAST MEDICAL HISTORY:  Parkinsons disease dx in 5/20 Past Medical History:  Diagnosis Date  . Atrial fibrillation ([place])   . Empyema [facility])    in [facility] Aug 2017- stayed in hospital on  for 2 months.  . Hypertension     SOCIAL HX:  Social History   Tobacco Use  . Smoking status: Former Smoker    Types: Cigarettes  . Smokeless tobacco: Never Used  . Tobacco comment: quit in 2001. smoked for 30 years  Substance Use Topics  . Alcohol use: No    ALLERGIES:  Allergies  Allergen Reactions  . Levaquin [Levofloxacin]   . Other Nausea And Vomiting  . Sulfa  Antibiotics Hives  . Oxycodone Anxiety and Other (See Comments)     PERTINENT MEDICATIONS:  Outpatient Encounter Medications as of 09/14/2019  Medication Sig  . acetaminophen (TYLENOL) 325 MG tablet Take 650 mg by mouth every 4 (four) hours as needed for mild pain or fever.  Marland Kitchen amiodarone (PACERONE) 200 MG tablet Take 1 tablet (200 mg total) by mouth daily.  . carbidopa-levodopa (SINEMET IR) 10-100 MG tablet Take 1 tablet by mouth 3 (three) times daily.  . carvedilol (COREG) 3.125 MG tablet Take 1 tablet (3.125 mg total) by mouth 2 (two) times daily with a meal. (Patient not taking: Reported on 08/19/2018)  . ferrous sulfate 324 (65 Fe) MG TBEC Take 1 tablet by mouth daily.  . furosemide (LASIX) 20 MG tablet Take 1 tablet (20 mg total) by mouth daily.  Marland Kitchen gabapentin (NEURONTIN) 300 MG capsule Take 300 mg by mouth at bedtime.  Marland Kitchen ipratropium-albuterol (DUONEB) 0.5-2.5 (3) MG/3ML SOLN Take 3 mLs by nebulization 3 (three) times daily.  . Melatonin 5 MG TABS Take 5 mg by mouth at bedtime.  . Multiple Vitamin (MULTIVITAMIN WITH MINERALS) TABS tablet Take 1 tablet by mouth daily.  . Multiple Vitamins-Minerals (CENTROVITE) TABS Take 1 tablet by mouth daily.  Marland Kitchen nystatin (NYSTATIN) powder Apply topically 2 (two) times daily as needed. APPLY UNDER NECK TWICE DAILY AS NEEDED FOR YEAST  . omeprazole (PRILOSEC OTC) 20 MG tablet Take 20 mg by mouth 2 (two) times daily.  Marland Kitchen PARoxetine (PAXIL) 30 MG tablet Take 30 mg by mouth daily.   Marland Kitchen senna (SENOKOT) 8.6 MG TABS tablet Take 1 tablet by mouth daily.  . traZODone (DESYREL) 50 MG tablet Take 25 mg by mouth at bedtime.   No facility-administered encounter medications on file as of 09/14/2019.     PHYSICAL EXAM / ROS:   98.3  140/72  80-16 Current and past weights: 232.8 lbs and has gained 8 lbs in 5 months General: NAD, frail, obese Cardiovascular: no chest pain reported, no edema in LE Pulmonary: no cough, no increased SOB, Oxygen 2 liters continually   Abdomen: appetite excellent, denies constipation, incontinent of bowel GU: denies dysuria, incontinent of urine, no recent infections MSK:  no joint deformities, non ambulatory,  States she likes to be oob in her chair to knit. Occupational Therapy is seeing her for exercises. No falls reported. Skin: no rashes or wounds reported Neurological: Weakness, good mood, sleep good, no pain, no gross tremor activity, states she has no trouble with feeding. She tried to calculate her age but could not, her 32th birthday is coming up.  Jason Coop, NP

## 2019-11-08 ENCOUNTER — Other Ambulatory Visit: Payer: Self-pay

## 2019-11-08 ENCOUNTER — Non-Acute Institutional Stay: Payer: Medicare PPO | Admitting: Primary Care

## 2019-11-08 DIAGNOSIS — Z515 Encounter for palliative care: Secondary | ICD-10-CM

## 2019-11-08 NOTE — Progress Notes (Signed)
Therapist, nutritional Palliative Care Consult Note Telephone: [phone]  Fax: [phone]  TELEHEALTH VISIT STATEMENT Due to the COVID-19 crisis, this visit was done via telemedicine from my office. It was initiated and consented to by this patient and/or family.  PATIENT NAME: Mikayla [name] [address] [address] [place] [address] [phone] (home)  DOB: [birth year] MRN: [id number]  PRIMARY CARE PROVIDER:   Keane Police, MD, [phone] Mikayla [name] [place] [address] [phone]  REFERRING PROVIDER:  Keane Police, MD [id number] [place],  [place] [address] [phone]  RESPONSIBLE PARTY:   Extended Emergency Contact Information Primary Emergency Contact: Mikayla [name] Address: [address]          [place], [place] [address] Darden Amber of [place] Home Phone: [phone] Relation: Daughter Secondary Emergency Contact: Mikayla [name] Address: [address]          [place], [place] [address] Darden Amber of [place] Home Phone: [phone] Relation: Daughter   ASSESSMENT AND RECOMMENDATIONS:   1. Advance Care Planning/Goals of Care: Goals include to maximize quality of life and symptom management. Has DNR on file. T/c to POA Mikayla [name] to offer consultation for questions/concerns. No answer, message left.  2. Symptom Management:    Tremors: Appear controlled for now. Can do handiwork which gives her satisfaction.  Dyspnea: Oxygen in place. Does not have dyspnea at rest or with speaking.  Nutrition: Continues to have a good appetite and weight gain not attributed to edema  3. Family /Caregiver/Community Supports: Has 2 daughters who over see care, are POA. Supportive and visit often. Lives in LTC.  4. Cognitive / Functional decline: At cognitive baseline although has hard time hearing over video platform. Functionally stable gets oob daily to chair per her report.  5. Follow up Palliative Care Visit: Palliative  care will continue to follow for goals of care clarification and symptom management. Return 12 weeks or prn.  I spent 25 minutes providing this consultation,  from 1030 to 1055. More than 50% of the time in this consultation was spent coordinating communication.   HISTORY OF PRESENT ILLNESS:  Mikayla [name] is a 79 y.o. year old female with multiple medical problems including Obesity, COPD, oxygen use, debility, a fib, tremors. . Palliative Care was asked to follow this patient by consultation request of Mikayla [name]* to help address advance care planning and goals of care. This is a follow up visit.  CODE STATUS: DNR  PPS: 30% HOSPICE ELIGIBILITY/DIAGNOSIS: TBD  PAST MEDICAL HISTORY:  Past Medical History:  Diagnosis Date  . Atrial fibrillation (HCC)   . Empyema [facility])    in [facility] Aug 2017- stayed in hospital on for 2 months.  . Hypertension     SOCIAL HX:  Social History   Tobacco Use  . Smoking status: Former Smoker    Types: Cigarettes  . Smokeless tobacco: Never Used  . Tobacco comment: quit in 2001. smoked for 30 years  Substance Use Topics  . Alcohol use: No    ALLERGIES:  Allergies  Allergen Reactions  . Levaquin [Levofloxacin]   . Other Nausea And Vomiting  . Sulfa Antibiotics Hives  . Oxycodone Anxiety and Other (See Comments)     PERTINENT MEDICATIONS:  Outpatient Encounter Medications as of 11/08/2019  Medication Sig  . acetaminophen (TYLENOL) 325 MG tablet Take 650 mg by mouth every 4 (four) hours as needed for mild pain or fever.  Marland Kitchen amiodarone (PACERONE) 200 MG tablet Take 1 tablet (200 mg total) by  mouth daily.  . carbidopa-levodopa (SINEMET IR) 10-100 MG tablet Take 1 tablet by mouth 3 (three) times daily.  . carvedilol (COREG) 3.125 MG tablet Take 1 tablet (3.125 mg total) by mouth 2 (two) times daily with a meal. (Patient not taking: Reported on 08/19/2018)  . ferrous sulfate 324 (65 Fe) MG TBEC Take 1 tablet by mouth daily.  . furosemide  (LASIX) 20 MG tablet Take 1 tablet (20 mg total) by mouth daily.  Marland Kitchen gabapentin (NEURONTIN) 300 MG capsule Take 300 mg by mouth at bedtime.  Marland Kitchen ipratropium-albuterol (DUONEB) 0.5-2.5 (3) MG/3ML SOLN Take 3 mLs by nebulization 3 (three) times daily.  . Melatonin 5 MG TABS Take 5 mg by mouth at bedtime.  . Multiple Vitamin (MULTIVITAMIN WITH MINERALS) TABS tablet Take 1 tablet by mouth daily.  . Multiple Vitamins-Minerals (CENTROVITE) TABS Take 1 tablet by mouth daily.  Marland Kitchen nystatin (NYSTATIN) powder Apply topically 2 (two) times daily as needed. APPLY UNDER NECK TWICE DAILY AS NEEDED FOR YEAST  . omeprazole (PRILOSEC OTC) 20 MG tablet Take 20 mg by mouth 2 (two) times daily.  Marland Kitchen PARoxetine (PAXIL) 30 MG tablet Take 30 mg by mouth daily.   Marland Kitchen senna (SENOKOT) 8.6 MG TABS tablet Take 1 tablet by mouth daily.  . traZODone (DESYREL) 50 MG tablet Take 25 mg by mouth at bedtime.   No facility-administered encounter medications on file as of 11/08/2019.    PHYSICAL EXAM / ROS:  98.0-73-16 136/85  Current and past weights: 2345.4 lbs, gaining General: NAD, frail appearing, obese Cardiovascular: no chest pain reported, no edema reported Pulmonary: no cough, no increased SOB at rest, oxygen at 2 L Abdomen: appetite excellent, denies constipation, incontinent of bowel GU: denies dysuria, incontinent of urine MSK:  no joint deformities, non ambulatory, oob with lift to chair Skin: no rashes or wounds reported Neurological: Weakness, tremors controlled with sinemet, endorses good sleep.  Mikayla Coop, NP , [facility]

## 2019-11-09 ENCOUNTER — Non-Acute Institutional Stay: Payer: Medicare Other | Admitting: Primary Care

## 2020-01-26 ENCOUNTER — Other Ambulatory Visit: Payer: Self-pay

## 2020-01-26 ENCOUNTER — Non-Acute Institutional Stay: Payer: Medicare PPO | Admitting: Primary Care

## 2020-01-26 DIAGNOSIS — Z515 Encounter for palliative care: Secondary | ICD-10-CM

## 2020-01-26 NOTE — Progress Notes (Signed)
[facility] Consult Note Telephone: [phone]  Fax: [phone]    PATIENT NAME: Mikayla [name] [place] [address] [phone] (home)  DOB: 1941/07/30 MRN: [id number]  PRIMARY CARE PROVIDER:   Alvester Morin, MD, [address]. [place] [address] [phone]  REFERRING PROVIDER:  Alvester Morin, MD [address]. [place],  [place] [address] [phone]  RESPONSIBLE PARTY:   Extended Emergency Contact Information Primary Emergency Contact: Buesing,[name] Address: [address]          [place], [place] [address] Johnnette Litter of [place] Phone: [phone] Relation: Daughter Secondary Emergency Contact: Atkinson,[name] Address: [address]          [place], [place] [address] Johnnette Litter of [place] Phone: [phone] Relation: Daughter  I met with patient in her nursing home room.  ASSESSMENT AND RECOMMENDATIONS:   1. Advance Care Planning/Goals of Care: Goals include to maximize quality of life and symptom management. DNR on file. A and O x 3 and makes own decisions.   2. Symptom Management:   Dyspnea; No increased symptoms. Has oxygen and can speak with mild DOE. Mainly bedbound.  Nutrition: Eats well, has been gaining weight. Family brings in food. She states she has snacks and enjoys having outside meals. Is also being seen by dentistry today.  3. Family /Caregiver/Community Supports: Daughters in area, visit daily. Lives in [place].  4. Cognitive / Functional decline:  A and O x 3, can make own decisions.  5. Follow up Palliative Care Visit: Palliative care will continue to follow for goals of care clarification and symptom management. Return 8-12 weeks or prn.  I spent ** minutes providing this consultation,  from 25 to 1130. More than 50% of the time in this consultation was spent coordinating communication.   HISTORY OF PRESENT ILLNESS:  LIANNY MOLTER is a 79 y.o. year old female with multiple medical problems including 1155. Palliative Care was asked to follow this patient by consultation request of Slade-Hartman, Ivette Loyal* to help address advance care planning and goals of care. This is a follow up visit.  CODE STATUS: DNR  PPS: 30% HOSPICE ELIGIBILITY/DIAGNOSIS: TBD  PAST MEDICAL HISTORY:  Past Medical History:  Diagnosis Date  . Atrial fibrillation ([place])   . Empyema [facility])    in [facility] Aug 2017- stayed in hospital on for 2 months.  . Hypertension     SOCIAL HX:  Social History   Tobacco Use  . Smoking status: Former Smoker    Types: Cigarettes  . Smokeless tobacco: Never Used  . Tobacco comment: quit in 2001. smoked for 30 years  Substance Use Topics  . Alcohol use: No    ALLERGIES:  Allergies  Allergen Reactions  . Levaquin [Levofloxacin]   . Other Nausea And Vomiting  . Sulfa Antibiotics Hives  . Oxycodone Anxiety and Other (See Comments)     PERTINENT MEDICATIONS:  Outpatient Encounter Medications as of 01/26/2020  Medication Sig  . acetaminophen (TYLENOL) 325 MG tablet Take 650 mg by mouth daily as needed for mild pain or fever.   Marland Kitchen amiodarone (PACERONE) 200 MG tablet Take 1 tablet (200 mg total) by mouth daily.  . Ammonium Lactate 10 % CREA Apply 1 application topically.  . calcium carbonate (TUMS - DOSED IN MG ELEMENTAL CALCIUM) 500 MG chewable tablet Chew 1 tablet by mouth 3 (three) times daily as needed for indigestion or heartburn.  . carbidopa-levodopa (SINEMET IR) 10-100 MG  tablet Take 1 tablet by mouth 3 (three) times daily. (Patient taking differently: Take 1 tablet by mouth 4 (four) times daily. )  . carvedilol (COREG) 3.125 MG tablet Take 1 tablet (3.125 mg total) by mouth 2 (two) times daily with a meal.  . ferrous sulfate 324 (65 Fe) MG TBEC Take 1 tablet by mouth daily.  . fluticasone (FLONASE) 50 MCG/ACT nasal spray Place 1 spray into both nostrils daily.  . furosemide (LASIX) 20 MG tablet  Take 1 tablet (20 mg total) by mouth daily.  Marland Kitchen gabapentin (NEURONTIN) 300 MG capsule Take 300 mg by mouth at bedtime.  . hydrocortisone (ANUSOL-HC) 25 MG suppository Place 25 mg rectally 3 (three) times daily as needed for hemorrhoids or anal itching.  . Ipratropium-Albuterol (COMBIVENT RESPIMAT) 20-100 MCG/ACT AERS respimat Inhale 1 puff into the lungs 3 (three) times daily.  . Melatonin 5 MG TABS Take 5 mg by mouth at bedtime.  . Multiple Vitamin (MULTIVITAMIN WITH MINERALS) TABS tablet Take 1 tablet by mouth daily.  . Multiple Vitamins-Minerals (CENTROVITE) TABS Take 1 tablet by mouth daily.  Marland Kitchen nystatin (NYSTATIN) powder Apply topically 2 (two) times daily as needed. APPLY UNDER NECK TWICE DAILY AS NEEDED FOR YEAST  . omeprazole (PRILOSEC OTC) 20 MG tablet Take 20 mg by mouth 2 (two) times daily.  Marland Kitchen PARoxetine (PAXIL) 30 MG tablet Take 30 mg by mouth daily.   Marland Kitchen senna (SENOKOT) 8.6 MG TABS tablet Take 1 tablet by mouth daily.  . [DISCONTINUED] ipratropium-albuterol (DUONEB) 0.5-2.5 (3) MG/3ML SOLN Take 3 mLs by nebulization 3 (three) times daily.  . [DISCONTINUED] traZODone (DESYREL) 50 MG tablet Take 25 mg by mouth at bedtime.   No facility-administered encounter medications on file as of 01/26/2020.    PHYSICAL EXAM / ROS:   Current and past weights:  235.4 lbs General: NAD, frail appearing, obese Cardiovascular: no chest pain reported, no edema Pulmonary: no cough, no increased SOB, DOE Abdomen: appetite good, denies constipation, incontinent of bowel GU: denies dysuria, incontinent of urine MSK:  no joint deformities, non ambulatory, oob in chair at times Skin: no rashes or wounds reported Neurological: Weakness, a and o, denies pain.  Jason Coop, NP [facility]  COVID-19 PATIENT SCREENING TOOL  Person answering questions: ____________Staff_______ _____   1.  Is the patient or any family member in the home showing any signs or symptoms regarding respiratory infection?                Person with Symptom- __________NA_________________  a. Fever                                                                          Yes___ No___          ___________________  b. Shortness of breath                                                    Yes___ No___          ___________________ c. Cough/congestion  Yes___  No___         ___________________ d. Body aches/pains                                                         Yes___ No___        ____________________ e. Gastrointestinal symptoms (diarrhea, nausea)           Yes___ No___        ____________________  2. Within the past 14 days, has anyone living in the home had any contact with someone with or under investigation for COVID-19?    Yes___ No_X_   Person __________________

## 2020-01-31 ENCOUNTER — Non-Acute Institutional Stay: Payer: Medicare PPO | Admitting: Primary Care

## 2020-04-03 ENCOUNTER — Non-Acute Institutional Stay: Payer: Medicare PPO | Admitting: Primary Care

## 2020-04-03 ENCOUNTER — Other Ambulatory Visit: Payer: Self-pay

## 2020-04-03 DIAGNOSIS — Z515 Encounter for palliative care: Secondary | ICD-10-CM

## 2020-04-03 NOTE — Progress Notes (Signed)
  [facility] Consult Note Telephone: [phone]  Fax: [phone]  PATIENT NAME: Mikayla [name] [address] [address] [place] [address] [phone] (home)  DOB: 07/05/1941 MRN: [id number]  PRIMARY CARE PROVIDER:    Slade-Hartman, Venezela, MD,  [address] [place] [address] [phone]  REFERRING PROVIDER:   Slade-Hartman, Venezela, MD [address] [place],  [place] [address] [phone]  RESPONSIBLE PARTY:   Extended Emergency Contact Information Primary Emergency Contact: Buesing,[name] Address: [address]          [place], [place] [address] [place] Home Phone: [phone] Relation: Daughter Secondary Emergency Contact: Atkinson,[name] Address: [address]          [place],  [address] [place] Home Phone: [phone] Relation: Daughter  I met with patient in the facility.  ASSESSMENT AND RECOMMENDATIONS:   1. Advance Care Planning/Goals of Care: Goals include to maximize quality of life and symptom management. DNR Documents on File. I spoke with daughter [name] by phone.   2. Symptom Management:   I saw Mikayla [name] in her room today. She was pleasant and in a positive mood. She said she felt good and had been very much enjoying her family visits now that things have opened up from Covid.   Tremors: She did have some increased tremors that her daughter was concerned about. I would recommend at her daughters request a review of her tremor medication and possible increase of her Carbidopa/levodopa. It was decreased, and apparently tremors have worsened.  SOB: Her daughter was also concerned that she had been on a maintenance duoneb nebulizer prior to Covid, b.i.d. She felt that patient had a better baseline respiratory effort with ATC nebulizer rx. I recommend to review to return to that schedule when nebulization may be used in the facility, possibly doing  ATC  b.i.d. and Q6 hours PRN for wheezing. Patient had oxygen in place and had no dyspnea on speaking.   Mobility: I also encourage her to be out of the bed at least daily in the chair and recommended the daughter ask for this to be part of her care plan as well.  I will return in 2 to 3 months or as needed.  3. Family /Caregiver/Community Supports: Daughters and family in area, lives in LTC.  4. Cognitive / Functional decline: A and O x 3. Needs assistance with most adls, all iadls.   5. Follow up Palliative Care Visit: Palliative care will continue to follow for goals of care clarification and symptom management. Return 12 weeks or prn.  I spent 35 minutes providing this consultation,  from 1100 to 1135. More than 50% of the time in this consultation was spent coordinating communication.   HISTORY OF PRESENT ILLNESS:  Mikayla [name] is a 78 y.o. year old female with multiple medical problems including Obesity, COPD, oxygen use, debility, a fib, tremors. Palliative Care was asked to follow this patient by consultation request of Slade-Hartman, Venezela* to help address advance care planning and goals of care. This is a follow up visit.  CODE STATUS: DNR  PPS: 30%  HOSPICE ELIGIBILITY/DIAGNOSIS: TBD  PAST MEDICAL HISTORY:  Past Medical History:  Diagnosis Date  . Atrial fibrillation (HCC)   . Empyema (HCC)    in [facility] Aug 2017- stayed in hospital on for 2 months.  . Hypertension     SOCIAL HX:  Social History   Tobacco Use  . Smoking status: Former   Smoker    Types: Cigarettes  . Smokeless tobacco: Never Used  . Tobacco comment: quit in 2001. smoked for 30 years  Substance Use Topics  . Alcohol use: No    ALLERGIES:  Allergies  Allergen Reactions  . Levaquin [Levofloxacin]   . Other Nausea And Vomiting  . Sulfa Antibiotics Hives  . Oxycodone Anxiety and Other (See Comments)     PERTINENT MEDICATIONS:  Outpatient Encounter Medications as of 04/03/2020  Medication Sig   . acetaminophen (TYLENOL) 325 MG tablet Take 650 mg by mouth daily as needed for mild pain or fever.   Marland Kitchen amiodarone (PACERONE) 200 MG tablet Take 1 tablet (200 mg total) by mouth daily.  . Ammonium Lactate 10 % CREA Apply 1 application topically.  . calcium carbonate (TUMS - DOSED IN MG ELEMENTAL CALCIUM) 500 MG chewable tablet Chew 1 tablet by mouth 3 (three) times daily as needed for indigestion or heartburn.  . carbidopa-levodopa (SINEMET IR) 10-100 MG tablet Take 1 tablet by mouth 3 (three) times daily. (Patient taking differently: Take 1 tablet by mouth 4 (four) times daily. )  . carvedilol (COREG) 3.125 MG tablet Take 1 tablet (3.125 mg total) by mouth 2 (two) times daily with a meal.  . ferrous sulfate 324 (65 Fe) MG TBEC Take 1 tablet by mouth daily.  . fluticasone (FLONASE) 50 MCG/ACT nasal spray Place 1 spray into both nostrils daily.  . furosemide (LASIX) 20 MG tablet Take 1 tablet (20 mg total) by mouth daily.  Marland Kitchen gabapentin (NEURONTIN) 300 MG capsule Take 300 mg by mouth at bedtime.  . hydrocortisone (ANUSOL-HC) 25 MG suppository Place 25 mg rectally 3 (three) times daily as needed for hemorrhoids or anal itching.  . Ipratropium-Albuterol (COMBIVENT RESPIMAT) 20-100 MCG/ACT AERS respimat Inhale 1 puff into the lungs 3 (three) times daily.  . Melatonin 5 MG TABS Take 5 mg by mouth at bedtime.  . Multiple Vitamin (MULTIVITAMIN WITH MINERALS) TABS tablet Take 1 tablet by mouth daily.  . Multiple Vitamins-Minerals (CENTROVITE) TABS Take 1 tablet by mouth daily.  Marland Kitchen nystatin (NYSTATIN) powder Apply topically 2 (two) times daily as needed. APPLY UNDER NECK TWICE DAILY AS NEEDED FOR YEAST  . omeprazole (PRILOSEC OTC) 20 MG tablet Take 20 mg by mouth 2 (two) times daily.  Marland Kitchen PARoxetine (PAXIL) 30 MG tablet Take 30 mg by mouth daily.   Marland Kitchen senna (SENOKOT) 8.6 MG TABS tablet Take 1 tablet by mouth daily.   No facility-administered encounter medications on file as of 04/03/2020.    PHYSICAL EXAM  / ROS:   Current and past weights: stable  General: NAD, frail appearing,obese Cardiovascular: no chest pain reported, no edema  In LE Pulmonary: occ  cough, no increased SOB, oxygen at 3 L / [place] Abdomen: appetite good, denies constipation, incontinent of bowel GU: denies dysuria, incontinent of urine MSK:  + joint and ROM abnormalities,non  Ambulatory, oob with lift Skin: no rashes or wounds reported Neurological: Weakness, tremors worsened  Jason Coop, NP [facility]  COVID-19 PATIENT SCREENING TOOL  Person answering questions: ____________Staff_______ _____   1.  Is the patient or any family member in the home showing any signs or symptoms regarding respiratory infection?               Person with Symptom- __________NA_________________  a. Fever  Yes___ No___          ___________________  b. Shortness of breath                                                    Yes___ No___          ___________________ c. Cough/congestion                                       Yes___  No___         ___________________ d. Body aches/pains                                                         Yes___ No___        ____________________ e. Gastrointestinal symptoms (diarrhea, nausea)           Yes___ No___        ____________________  2. Within the past 14 days, has anyone living in the home had any contact with someone with or under investigation for COVID-19?    Yes___ No_X_   Person __________________

## 2020-08-02 ENCOUNTER — Other Ambulatory Visit: Payer: Self-pay

## 2020-08-02 ENCOUNTER — Non-Acute Institutional Stay: Payer: Medicare PPO | Admitting: Primary Care

## 2020-08-02 DIAGNOSIS — Z515 Encounter for palliative care: Secondary | ICD-10-CM

## 2020-08-02 DIAGNOSIS — R5381 Other malaise: Secondary | ICD-10-CM

## 2020-08-02 NOTE — Progress Notes (Signed)
[facility] Consult Note Telephone: [phone]  Fax: [phone]  PATIENT NAME: Mikayla [name] [place] [address] [phone] (home)  DOB: 1941-07-10 MRN: [id number]  PRIMARY CARE PROVIDER:    Gennie Alma, MD,  [place] [address] [place]:   Mikayla [name], [place],  [place] [address] [phone]  RESPONSIBLE PARTY:   Extended Emergency Contact Information Primary Emergency Contact: [name],Mikayla Address: [address]          [place], [place] [address] [place]net[place] of [place] Phone: [phone] Relation: Daughter Secondary Emergency Contact: [name],Mikayla Address: [address]          [place], [place] [address] Johnnette Litter of [place] Phone: [phone] Relation: Daughter  I met face to face with patient  In the facility.   ASSESSMENT AND RECOMMENDATIONS:   1. Advance Care Planning/Goals of Care: Goals include to maximize quality of life and symptom management. DNR on record  2. Symptom Management:   I met with Mikayla [name] in her nursing home room today. She states overall shes been doing well; she does not report pain or skin breakdown. Today she states she is incontinent and needs help with cleaning her wet and soiled diaper. Meal trays are about to be delivered and so she states that she would like to have her diaper changed before her meal. Generally she states that she is eating well and maintaining her weight. Shes out of bed into her recliner several times a week per nursing home staff. She appears at her baseline interactive and oriented.  3. Follow up Palliative Care Visit: Palliative care will continue to follow for goals of care clarification and symptom management. Return 8 weeks or prn.  4. Family /Caregiver/Community Supports: daughters are [place], lives in [place].  5. Cognitive / Functional decline: A and  O x 3, bedbound. Dependent in all adls, iadls.  I spent 25 minutes providing this consultation,  from 1335 to 1400. More than 50% of the time in this consultation was spent coordinating communication.   CHIEF COMPLAINT: debility.  HISTORY OF PRESENT ILLNESS:  Mikayla [name] is a 79 y.o. year old female  with loss of ability to ambulate, for self care. She is dependent in all adls and essentially bedbound. She is at risk for complications of immobility. We are asked to consult around chronic disease and decline.    Palliative Care was asked to follow this patient by consultation request of Mikayla Alma, MD to help address advance care planning and goals of care. This is a follow up visit.  CODE STATUS: DNR PPS: 40%  HOSPICE ELIGIBILITY/DIAGNOSIS: no  ROS  General: NAD EYES: denies vision changes, wears glasses ti read ENMT: denies dysphagia Cardiovascular: denies chest pain Pulmonary: denies  cough, denies increased SOB Abdomen: endorses appetite as good,  Denies constipation, endorses incontinence of bowel GU: denies dysuria, endorses incontinence of urine MSK:  endorses ROM limitations, no falls reported Skin: denies rashes or wounds Neurological: endorses weakness, denies pain, denies insomnia Psych: Endorses positive mood  Physical Exam: Current and past weights: 233 lbs Constitutional:  NAD General :frail appearing, Obese  EYES: anicteric sclera,EOMI, lids intact, no discharge  ENMT: intact hearing,oral mucous membranes moist, dentition intact CV:  no LE edema Pulmonary:no increased work of breathing, no cough, no audible wheezes,  room air Abdomen: no ascites GU: deferred MSK: decreased ROM in all extremities, non  ambulatory Skin: warm and dry, no rashes or wounds on visible skin Neuro: Weakness, no cognitive impairment, grossly non focal Psych: anxious affective, A and O x 3  CURRENT PROBLEM LIST:  Patient Active Problem List   Diagnosis Date Noted    General body deterioration 12/02/2018   Shortness of breath 12/02/2018   HTN (hypertension) 11/02/2018   PAF (paroxysmal atrial fibrillation) ([place]) 11/02/2018   Renal hemorrhage, 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) 08/10/2018   Calculus of bile duct without cholangitis or cholecystitis with obstruction    Cellulitis 11/20/2016   Pressure injury of skin 09/30/2016   Palliative care encounter    Goals of care, counseling/discussion    Encounter for hospice care discussion    PAST MEDICAL HISTORY:  Past Medical History:  Diagnosis Date   Atrial fibrillation ([place])    Empyema ([place])    in [facility] Aug 2017- stayed in hospital on for 2 months.   Hypertension     SOCIAL HX:  Social History   Tobacco Use   Smoking status: Former Smoker    Types: Cigarettes   Smokeless tobacco: Never Used   Tobacco comment: quit in 2001. smoked for 30 years  Substance Use Topics   Alcohol use: No   FAMILY HX:  Family History  Problem Relation Age of Onset   Hypertension Mother    COPD Mother    Lung cancer Father    Liver cancer Sister     ALLERGIES:  Allergies  Allergen Reactions   Levaquin [Levofloxacin]    Other Nausea And Vomiting   Sulfa Antibiotics Hives   Oxycodone Anxiety and Other (See Comments)     PERTINENT MEDICATIONS:  Outpatient Encounter Medications as of 08/02/2020  Medication Sig   acetaminophen (TYLENOL) 325 MG tablet Take 650 mg by mouth 3 (three) times daily.   albuterol (VENTOLIN HFA) 108 (90 Base) MCG/ACT inhaler Inhale 2 puffs into the lungs every 2 (two) hours as needed for wheezing or shortness of breath.   amiodarone (PACERONE) 200 MG tablet Take 1 tablet (200 mg total) by mouth daily.   carbidopa-levodopa (SINEMET IR) 10-100 MG tablet Take 1 tablet by mouth 3 (three) times daily. (Patient taking  differently: Take 1 tablet by mouth 4 (four) times daily. )   carvedilol (COREG) 3.125 MG tablet Take 1 tablet (3.125 mg total) by mouth 2 (two) times daily with a meal.   ferrous sulfate 324 (65 Fe) MG TBEC Take 1 tablet by mouth daily.   furosemide (LASIX) 20 MG tablet Take 1 tablet (20 mg total) by mouth daily.   gabapentin (NEURONTIN) 300 MG capsule Take 300 mg by mouth at bedtime.   Ipratropium-Albuterol (COMBIVENT RESPIMAT) 20-100 MCG/ACT AERS respimat Inhale 1 puff into the lungs 3 (three) times daily.   Multiple Vitamins-Minerals (CENTROVITE) TABS Take 1 tablet by mouth daily.   nystatin (NYSTATIN) powder Apply topically 2 (two) times daily as needed. APPLY UNDER NECK TWICE DAILY AS NEEDED FOR YEAST   omeprazole (PRILOSEC OTC) 20 MG tablet Take 20 mg by mouth 2 (two) times daily.   PARoxetine (PAXIL) 30 MG tablet Take 30 mg by mouth daily.    senna (SENOKOT) 8.6 MG TABS tablet Take 2 tablets by mouth at bedtime.    zinc oxide 20 % ointment Apply 1 application topically every 8 (eight) hours. To groin and thighs each  shift   [DISCONTINUED] Ammonium Lactate 10 % CREA Apply 1 application topically.   [DISCONTINUED] calcium carbonate (TUMS - DOSED IN MG ELEMENTAL CALCIUM) 500 MG chewable tablet Chew 1 tablet by mouth 3 (three) times daily as needed for indigestion or heartburn.   [DISCONTINUED] fluticasone (FLONASE) 50 MCG/ACT nasal spray Place 1 spray into both nostrils daily.   [DISCONTINUED] hydrocortisone (ANUSOL-HC) 25 MG suppository Place 25 mg rectally 3 (three) times daily as needed for hemorrhoids or anal itching.   [DISCONTINUED] Melatonin 5 MG TABS Take 5 mg by mouth at bedtime.   [DISCONTINUED] Multiple Vitamin (MULTIVITAMIN WITH MINERALS) TABS tablet Take 1 tablet by mouth daily.   No facility-administered encounter medications on file as of 08/02/2020.     Jason Coop, NP , DNP, MPH, AGPCNP-BC, ACHPN  COVID-19 PATIENT SCREENING TOOL  Person  answering questions: ____________staff______ _____   1.  Is the patient or any family member in the home showing any signs or symptoms regarding respiratory infection?               Person with Symptom- __________NA_________________  a. Fever                                                                          Yes___ No___          ___________________  b. Shortness of breath                                                    Yes___ No___          ___________________ c. Cough/congestion                                       Yes___  No___         ___________________ d. Body aches/pains                                                         Yes___ No___        ____________________ e. Gastrointestinal symptoms (diarrhea, nausea)           Yes___ No___        ____________________  2. Within the past 14 days, has anyone living in the home had any contact with someone with or under investigation for COVID-19?    Yes___ No_X_   Person __________________

## 2020-11-01 ENCOUNTER — Non-Acute Institutional Stay: Payer: Medicare PPO | Admitting: Primary Care

## 2020-11-01 ENCOUNTER — Other Ambulatory Visit: Payer: Self-pay

## 2020-11-01 DIAGNOSIS — Z515 Encounter for palliative care: Secondary | ICD-10-CM

## 2020-11-01 DIAGNOSIS — R5381 Other malaise: Secondary | ICD-10-CM

## 2020-11-01 DIAGNOSIS — I480 Paroxysmal atrial fibrillation: Secondary | ICD-10-CM

## 2020-11-01 NOTE — Progress Notes (Signed)
[facility] Palliative Care Consult Note Telephone: [phone]  Fax: [phone]     Date of encounter: 11/01/20 PATIENT NAME: Mikayla [name] [place] [id number] [phone] (home)  DOB: 12-09-1940 MRN: [id number]  PRIMARY CARE PROVIDER:    Gennie Alma, MD,  [place] [address] [place]:   Gennie Alma, [address] [address] [place],  [place] [address] [phone]  RESPONSIBLE PARTY:   Extended Emergency Contact Information Primary Emergency Contact: Buesing,[name] Address: [address]          [place], [place] [address] Johnnette Litter of [place] Phone: [phone] Relation: Daughter Secondary Emergency Contact: Atkinson,[name] Address: [address]          [place], [place] [address] Johnnette Litter of [place] Phone: [phone] Relation: Daughter  I met face to face with patient in facility. Palliative Care was asked to follow this patient by consultation request of Gennie Alma, MD to help address advance care planning and goals of care. This is a follow up   visit.   ASSESSMENT AND RECOMMENDATIONS:   1. Advance Care Planning/Goals of Care: Goals include to maximize quality of life and symptom management. DNR on file, no changes.   2. Symptom Management:   I met with Mikayla [name] in her nursing home room; she was dozing but was able to be aroused for the interview. She states she's been feeling  well. She gets out of bed perhaps several times a week but not daily. She states that her appetite is good and indeed she has many snacks in her proximity. She does appear a little less animated than previously but could be fatigued. She states her family visit her often and that she had a good holiday season. She has not had any episodes of hospitalization or infections and endorses being at her baseline. No skin breakdown reported. Staff concurs. I will continue to  follow quarterly for palliative care needs.  3. Follow up Palliative Care Visit: Palliative care will continue to follow for goals of care clarification and symptom management. Return 12 weeks or prn.  4. Family /Caregiver/Community Supports: Daughters are [employer], lives in [place]  5. Cognitive / Functional decline: A and O x 2, dependent in all adls and iadls.  I spent 15 minutes providing this consultation,  from 1100 to 1115. More than 50% of the time in this consultation was spent coordinating communication.   CODE STATUS: DNR  PPS: 30%  HOSPICE ELIGIBILITY/DIAGNOSIS: TBD  Subjective:  CHIEF COMPLAINT: debility, immobility  HISTORY OF PRESENT ILLNESS:  Mikayla [name] is a 80 y.o. year old female  with obesity, copd, oxygen dependent, immobility .   We are asked to consult around advance care planning and complex medical decision making.   History obtained from review of EMR, discussion with primary team, and  interview with family, caregiver  and/or Mikayla [name]. Records reviewed and summarized above.   CURRENT PROBLEM LIST:  Patient Active Problem List   Diagnosis Date Noted  . General body deterioration 12/02/2018  . Shortness of breath 12/02/2018  . HTN (hypertension) 11/02/2018  . PAF (paroxysmal atrial fibrillation) ([place]) 11/02/2018  . Renal hemorrhage, right 11/02/2018  . Acute cholangitis   . Calculus of bile duct with acute cholangitis with obstruction   . Severe sepsis ([place]) 08/19/2018  . Acute respiratory failure ([place]) 08/10/2018  . Calculus of bile duct without cholangitis or cholecystitis with obstruction   .  Cellulitis 11/20/2016  . Pressure injury of skin 09/30/2016  . Palliative care encounter   . Goals of care, counseling/discussion   . Encounter for hospice care discussion    PAST MEDICAL HISTORY:  Active Ambulatory Problems    Diagnosis Date Noted  . Palliative care encounter   . Goals of care, counseling/discussion   . Encounter for hospice care  discussion   . Pressure injury of skin 09/30/2016  . Cellulitis 11/20/2016  . Acute respiratory failure (HCC) 08/10/2018  . Calculus of bile duct without cholangitis or cholecystitis with obstruction   . Severe sepsis (HCC) 08/19/2018  . Acute cholangitis   . Calculus of bile duct with acute cholangitis with obstruction   . HTN (hypertension) 11/02/2018  . PAF (paroxysmal atrial fibrillation) (HCC) 11/02/2018  . Renal hemorrhage, right 11/02/2018  . General body deterioration 12/02/2018  . Shortness of breath 12/02/2018   Resolved Ambulatory Problems    Diagnosis Date Noted  . Septic shock (HCC) 09/28/2016  . Diarrhea 09/28/2016  . Sepsis (HCC) 10/23/2016   Past Medical History:  Diagnosis Date  . Atrial fibrillation (HCC)   . Empyema (HCC)   . Hypertension    SOCIAL HX:  Social History   Tobacco Use  . Smoking status: Former Smoker    Types: Cigarettes  . Smokeless tobacco: Never Used  . Tobacco comment: quit in 2001. smoked for 30 years  Substance Use Topics  . Alcohol use: No   FAMILY HX:  Family History  Problem Relation Age of Onset  . Hypertension Mother   . COPD Mother   . Lung cancer Father   . Liver cancer Sister       ALLERGIES:  Allergies  Allergen Reactions  . Levaquin [Levofloxacin]   . Other Nausea And Vomiting  . Sulfa Antibiotics Hives  . Oxycodone Anxiety and Other (See Comments)     PERTINENT MEDICATIONS:  Outpatient Encounter Medications as of 11/01/2020  Medication Sig  . acetaminophen (TYLENOL) 325 MG tablet Take 650 mg by mouth 3 (three) times daily.  Marland Kitchen amiodarone (PACERONE) 200 MG tablet Take 1 tablet (200 mg total) by mouth daily.  . carbidopa-levodopa (SINEMET IR) 10-100 MG tablet Take 1 tablet by mouth 3 (three) times daily. (Patient taking differently: Take 1 tablet by mouth 4 (four) times daily.)  . carvedilol (COREG) 3.125 MG tablet Take 1 tablet (3.125 mg total) by mouth 2 (two) times daily with a meal.  . ferrous sulfate 324  (65 Fe) MG TBEC Take 1 tablet by mouth daily.  . furosemide (LASIX) 20 MG tablet Take 1 tablet (20 mg total) by mouth daily.  Marland Kitchen gabapentin (NEURONTIN) 300 MG capsule Take 300 mg by mouth at bedtime.  . Ipratropium-Albuterol (COMBIVENT RESPIMAT) 20-100 MCG/ACT AERS respimat Inhale 1 puff into the lungs 3 (three) times daily.  . Multiple Vitamins-Minerals (CENTROVITE) TABS Take 1 tablet by mouth daily.  Marland Kitchen nystatin (MYCOSTATIN/NYSTOP) powder Apply topically 2 (two) times daily as needed. APPLY UNDER NECK TWICE DAILY AS NEEDED FOR YEAST  . omeprazole (PRILOSEC OTC) 20 MG tablet Take 20 mg by mouth 2 (two) times daily.  Marland Kitchen PARoxetine (PAXIL) 30 MG tablet Take 30 mg by mouth daily.   Marland Kitchen senna (SENOKOT) 8.6 MG TABS tablet Take 2 tablets by mouth at bedtime.   Marland Kitchen zinc oxide 20 % ointment Apply 1 application topically every 8 (eight) hours. To groin and thighs each shift  . albuterol (VENTOLIN HFA) 108 (90 Base) MCG/ACT inhaler Inhale 2 puffs into the  lungs every 2 (two) hours as needed for wheezing or shortness of breath. (Patient not taking: Reported on 11/01/2020)   No facility-administered encounter medications on file as of 11/01/2020.    Objective: ROS  General: NAD EYES: denies vision changes, wears glasses ENMT: denies dysphagia Cardiovascular: denies chest pain Pulmonary: denies  cough, denies increased SOB Abdomen: endorses good appetite, denies onstipation, endorses incontinence of bowel GU: denies dysuria, endorses incontinence of urine MSK:  endorses ROM limitations, no falls reported Skin: denies rashes or wounds Neurological: endorses weakness, denies pain, denies insomnia Psych: Endorses positive mood Heme/lymph/immuno: denies bruises, abnormal bleeding  Physical Exam: Current and past weights: 226 lbs, gain of 1.5 lbs from 12/21. Constitutional: NAD General: frail appearing,obese  EYES: anicteric sclera,lids intact, no discharge  ENMT: intact hearing,oral mucous membranes moist,  dentition intact CV:  RRR, no LE edema Pulmonary:no increased work of breathing, no cough, no audible wheezes, suppplemental oxygen, nebulizer rx  Abdomen: intake 75%,no ascites GU: deferred MSK: moderate sarcopenia, decreased ROM in all extremities,  non ambulatory Skin: warm and dry, no rashes or wounds on visible skin Neuro: Generalized weakness, + cognitive impairment Psych: non-anxious affect, A and O x 2 Hem/lymph/immuno: no widespread bruising   Thank you for the opportunity to participate in the care of Ms. [name].  The palliative care team will continue to follow. Please call our office at [phone] if we can be of additional assistance.  Eliezer Lofts, NP , DNP, MPH, AGPCNP-BC, ACHPN  COVID-19 PATIENT SCREENING TOOL  Person answering questions: ____________staff______ _____   1.  Is the patient or any family member in the home showing any signs or symptoms regarding respiratory infection?               Person with Symptom- __________NA_________________  a. Fever                                                                          Yes___ No___          ___________________  b. Shortness of breath                                                    Yes___ No___          ___________________ c. Cough/congestion                                       Yes___  No___         ___________________ d. Body aches/pains                                                         Yes___ No___        ____________________ e. Gastrointestinal symptoms (diarrhea, nausea)           Yes___ No___  ____________________  2. Within the past 14 days, has anyone living in the home had any contact with someone with or under investigation for COVID-19?    Yes___ No_X_   Person __________________

## 2020-11-18 ENCOUNTER — Other Ambulatory Visit
Admission: RE | Admit: 2020-11-18 | Discharge: 2020-11-18 | Disposition: A | Discharge status: Home / Self Care | Payer: Medicare PPO | Source: Skilled Nursing Facility | Attending: General Practice | Admitting: General Practice

## 2020-11-18 DIAGNOSIS — R509 Fever, unspecified: Secondary | ICD-10-CM | POA: Insufficient documentation

## 2020-11-18 DIAGNOSIS — R300 Dysuria: Secondary | ICD-10-CM | POA: Diagnosis present

## 2020-11-18 LAB — URINALYSIS, COMPLETE (UACMP) WITH MICROSCOPIC
Bilirubin Urine: NEGATIVE
Glucose, UA: NEGATIVE mg/dL
Ketones, ur: NEGATIVE mg/dL
Leukocytes,Ua: NEGATIVE
Nitrite: NEGATIVE
Protein, ur: NEGATIVE mg/dL
Specific Gravity, Urine: 1.006 (ref 1.005–1.030)
pH: 7 (ref 5.0–8.0)

## 2020-11-19 LAB — URINE CULTURE: Culture: <10000 — AB

## 2020-11-24 ENCOUNTER — Encounter: Payer: Self-pay | Admitting: Emergency Medicine

## 2020-11-24 ENCOUNTER — Emergency Department
Admission: EM | Admit: 2020-11-24 | Discharge: 2020-11-24 | Disposition: A | Discharge status: Home / Self Care | Payer: Medicare PPO | Attending: Emergency Medicine | Admitting: Emergency Medicine

## 2020-11-24 ENCOUNTER — Other Ambulatory Visit: Payer: Self-pay

## 2020-11-24 ENCOUNTER — Emergency Department: Payer: Medicare PPO

## 2020-11-24 DIAGNOSIS — R259 Unspecified abnormal involuntary movements: Secondary | ICD-10-CM | POA: Insufficient documentation

## 2020-11-24 DIAGNOSIS — I509 Heart failure, unspecified: Secondary | ICD-10-CM | POA: Insufficient documentation

## 2020-11-24 DIAGNOSIS — Z87891 Personal history of nicotine dependence: Secondary | ICD-10-CM | POA: Insufficient documentation

## 2020-11-24 DIAGNOSIS — I110 Hypertensive heart disease with heart failure: Secondary | ICD-10-CM | POA: Diagnosis not present

## 2020-11-24 DIAGNOSIS — Z79899 Other long term (current) drug therapy: Secondary | ICD-10-CM | POA: Diagnosis not present

## 2020-11-24 DIAGNOSIS — R251 Tremor, unspecified: Secondary | ICD-10-CM | POA: Diagnosis present

## 2020-11-24 HISTORY — DX: Parkinson's disease without dyskinesia, without mention of fluctuations: G20.A1

## 2020-11-24 HISTORY — DX: Anemia, unspecified: D64.9

## 2020-11-24 HISTORY — DX: Gastro-esophageal reflux disease without esophagitis: K21.9

## 2020-11-24 HISTORY — DX: Parkinson's disease: G20

## 2020-11-24 HISTORY — DX: Insomnia, unspecified: G47.00

## 2020-11-24 HISTORY — DX: Major depressive disorder, single episode, unspecified: F32.9

## 2020-11-24 HISTORY — DX: Unspecified osteoarthritis, unspecified site: M19.90

## 2020-11-24 HISTORY — DX: Heart failure, unspecified: I50.9

## 2020-11-24 LAB — COMPREHENSIVE METABOLIC PANEL
ALT: 13 U/L (ref 0–44)
AST: 20 U/L (ref 15–41)
Albumin: 3.5 g/dL (ref 3.5–5.0)
Alkaline Phosphatase: 60 U/L (ref 38–126)
Anion gap: 13 (ref 5–15)
BUN: 14 mg/dL (ref 8–23)
CO2: 37 mmol/L — ABNORMAL HIGH (ref 22–32)
Calcium: 8.8 mg/dL — ABNORMAL LOW (ref 8.9–10.3)
Chloride: 91 mmol/L — ABNORMAL LOW (ref 98–111)
Creatinine, Ser: 1.34 mg/dL — ABNORMAL HIGH (ref 0.44–1.00)
GFR, Estimated: 40 mL/min — ABNORMAL LOW (ref >60)
Glucose, Bld: 91 mg/dL (ref 70–99)
Potassium: 3.4 mmol/L — ABNORMAL LOW (ref 3.5–5.1)
Sodium: 141 mmol/L (ref 135–145)
Total Bilirubin: 0.5 mg/dL (ref 0.3–1.2)
Total Protein: 6.7 g/dL (ref 6.5–8.1)

## 2020-11-24 LAB — CBC
HCT: 40.3 % (ref 36.0–46.0)
Hemoglobin: 11.9 g/dL — ABNORMAL LOW (ref 12.0–15.0)
MCH: 29.5 pg (ref 26.0–34.0)
MCHC: 29.5 g/dL — ABNORMAL LOW (ref 30.0–36.0)
MCV: 99.8 fL (ref 80.0–100.0)
Platelets: 182 10*3/uL (ref 150–400)
RBC: 4.04 MIL/uL (ref 3.87–5.11)
RDW: 13.3 % (ref 11.5–15.5)
WBC: 8.9 10*3/uL (ref 4.0–10.5)
nRBC: 0 % (ref 0.0–0.2)

## 2020-11-24 MED ORDER — LEVETIRACETAM 500 MG PO TABS: 500.0000 mg | ORAL_TABLET | Freq: Two times a day (BID) | ORAL | 0 refills | Status: DC

## 2020-11-24 MED ORDER — LEVETIRACETAM 500 MG PO TABS
500.0000 mg | ORAL_TABLET | Freq: Once | ORAL | Status: AC
  Administered 2020-11-24: 500 mg via ORAL
  Filled 2020-11-24: qty 1

## 2020-11-24 NOTE — ED Triage Notes (Signed)
Patient brought in by ems from [facility]. EMS called out for tremors. Patient alert and oriented times three. Patient states that she is not sure why she is here but that she was told that she was shaking. Patient states that she feels fine.

## 2020-11-24 NOTE — ED Notes (Signed)
Pt sleeping and appears to be tremulous but awakes easily to voice and touch. VSS. Waiting for transport.

## 2020-11-24 NOTE — ED Provider Notes (Addendum)
[facility] Emergency Department Provider Note  ____________________________________________   Event Date/Time   First MD Initiated Contact with Patient 11/24/20 1033     (approximate)  I have reviewed the triage vital signs and the nursing notes.   HISTORY  Chief Complaint Tremors   HPI Mikayla [name] is a 80 y.o. female with a past medical history of CHF, chronic hypoxic respiratory failure on 4 L at baseline secondary to COPD from tobacco abuse, depression, OA, HTN, insomnia, A. fib rhythm controlled on amiodarone not anticoagulated, anemia and fairly advanced Parkinson nonambulatory at baseline with weakness in her left arm and left leg for the last several years and tremor in her bilateral upper extremities who presents accompanied by family from her nursing facility for assessment of some abnormal movements and twitching it seemed more significant than her baseline twitching.  Patient's daughter states she has seen the patient intermittently stare off and be unresponsive for couple seconds over the past week and during this time seem to be jerking her shoulders that is atypical Parkinson's tremor.    Past Medical History:  Diagnosis Date  . Anemia   . Atrial fibrillation (HCC)   . CHF (congestive heart failure) (HCC)   . Empyema [facility])    in [facility] Aug 2017- stayed in hospital on for 2 months.  Marland Kitchen GERD (gastroesophageal reflux disease)   . Hypertension   . Insomnia   . Major depressive disorder   . Osteoarthritis   . Parkinson disease [facility])     Patient Active Problem List   Diagnosis Date Noted  . General body deterioration 12/02/2018  . Shortness of breath 12/02/2018  . HTN (hypertension) 11/02/2018  . PAF (paroxysmal atrial fibrillation) (HCC) 11/02/2018  . Renal hemorrhage, right 11/02/2018  . Acute cholangitis   . Calculus of bile duct with acute cholangitis with obstruction   . Severe sepsis (HCC) 08/19/2018  . Acute respiratory  failure (HCC) 08/10/2018  . Calculus of bile duct without cholangitis or cholecystitis with obstruction   . Cellulitis 11/20/2016  . Pressure injury of skin 09/30/2016  . Palliative care encounter   . Goals of care, counseling/discussion   . Encounter for hospice care discussion     Past Surgical History:  Procedure Laterality Date  . EMBOLIZATION N/A 11/03/2018   Procedure: EMBOLIZATION;  Surgeon: Annice Needy, MD;  Location: [facility] INVASIVE CV LAB;  Service: Cardiovascular;  Laterality: N/A;  . ERCP N/A 08/23/2018   Procedure: ENDOSCOPIC RETROGRADE CHOLANGIOPANCREATOGRAPHY (ERCP);  Surgeon: Midge Minium, MD;  Location: [facility] ENDOSCOPY;  Service: Endoscopy;  Laterality: N/A;  . PEG TUBE PLACEMENT      Prior to Admission medications   Medication Sig Start Date End Date Taking? Authorizing Provider  levETIRAcetam (KEPPRA) 500 MG tablet Take 1 tablet (500 mg total) by mouth 2 (two) times daily. 11/24/20 12/24/20 Yes Gilles Chiquito, MD  acetaminophen (TYLENOL) 325 MG tablet Take 650 mg by mouth 3 (three) times daily.    [provider]  albuterol (VENTOLIN HFA) 108 (90 Base) MCG/ACT inhaler Inhale 2 puffs into the lungs every 2 (two) hours as needed for wheezing or shortness of breath. Patient not taking: Reported on 11/01/2020    [provider]  amiodarone (PACERONE) 200 MG tablet Take 1 tablet (200 mg total) by mouth daily. 11/22/16   Shaune Pollack, MD  carbidopa-levodopa (SINEMET IR) 10-100 MG tablet Take 1 tablet by mouth 3 (three) times daily. Patient taking differently: Take 1 tablet by mouth 4 (  four) times daily. 08/25/18   Milagros Loll, MD  carvedilol (COREG) 3.125 MG tablet Take 1 tablet (3.125 mg total) by mouth 2 (two) times daily with a meal. 08/12/18   Altamese Dilling, MD  ferrous sulfate 324 (65 Fe) MG TBEC Take 1 tablet by mouth daily.    [provider]  furosemide (LASIX) 20 MG tablet Take 1 tablet (20 mg total) by mouth daily. 10/30/16   Milagros Loll, MD  gabapentin (NEURONTIN) 300 MG capsule Take 300 mg by mouth at bedtime.    [provider]  Ipratropium-Albuterol (COMBIVENT RESPIMAT) 20-100 MCG/ACT AERS respimat Inhale 1 puff into the lungs 3 (three) times daily.    [provider]  Multiple Vitamins-Minerals (CENTROVITE) TABS Take 1 tablet by mouth daily.    [provider]  nystatin (MYCOSTATIN/NYSTOP) powder Apply topically 2 (two) times daily as needed. APPLY UNDER NECK TWICE DAILY AS NEEDED FOR YEAST    [provider]  omeprazole (PRILOSEC OTC) 20 MG tablet Take 20 mg by mouth 2 (two) times daily.    [provider]  PARoxetine (PAXIL) 30 MG tablet Take 30 mg by mouth daily.     [provider]  senna (SENOKOT) 8.6 MG TABS tablet Take 2 tablets by mouth at bedtime.     [provider]  zinc oxide 20 % ointment Apply 1 application topically every 8 (eight) hours. To groin and thighs each shift    [provider]    Allergies Levaquin [levofloxacin], Other, Sulfa antibiotics, and Oxycodone  Family History  Problem Relation Age of Onset  . Hypertension Mother   . COPD Mother   . Lung cancer Father   . Liver cancer Sister     Social History Social History   Tobacco Use  . Smoking status: Former Smoker    Types: Cigarettes  . Smokeless tobacco: Never Used  . Tobacco comment: quit in 2001. smoked for 30 years  Substance Use Topics  . Alcohol use: No  . Drug use: No    Review of Systems  Review of Systems  Constitutional: Negative for chills and fever.  HENT: Negative for sore throat.   Eyes: Negative for pain.  Respiratory: Negative for cough and stridor.   Cardiovascular: Negative for chest pain.  Gastrointestinal: Negative for vomiting.  Genitourinary: Negative for dysuria.  Musculoskeletal: Negative for myalgias.  Skin: Negative for rash.  Neurological: Positive for tremors. Negative for seizures, loss of consciousness and  headaches.  Psychiatric/Behavioral: Negative for suicidal ideas.  All other systems reviewed and are negative.     ____________________________________________   PHYSICAL EXAM:  VITAL SIGNS: ED Triage Vitals [11/24/20 0652]  Enc Vitals Group     BP (!) 161/74     Pulse Rate 68     Resp 18     Temp 98.1 F (36.7 C)     Temp Source Oral     SpO2 98 %     Weight 242 lb 8.1 oz (110 kg)     Height 5\' 3"  (1.6 m)     Head Circumference      Peak Flow      Pain Score 0     Pain Loc      Pain Edu?      Excl. in GC?    Vitals:   11/24/20 0849 11/24/20 1054  BP: (!) 157/55   Pulse: 74   Resp: 20   Temp: 97.8 F (36.6 C) 98.9 F (37.2 C)  SpO2:  99%    Physical Exam Vitals and nursing note reviewed.  Constitutional:      General: She is not in acute distress.    Appearance: She is well-developed and well-nourished.  HENT:     Head: Normocephalic and atraumatic.     Right Ear: External ear normal.     Left Ear: External ear normal.     Nose: Nose normal.  Eyes:     Conjunctiva/sclera: Conjunctivae normal.  Cardiovascular:     Rate and Rhythm: Normal rate and regular rhythm.     Heart sounds: No murmur heard.   Pulmonary:     Effort: Pulmonary effort is normal. No respiratory distress.     Breath sounds: Normal breath sounds.  Abdominal:     Palpations: Abdomen is soft.     Tenderness: There is no abdominal tenderness.  Musculoskeletal:        General: No edema.     Cervical back: Neck supple.  Skin:    General: Skin is warm and dry.     Capillary Refill: Capillary refill takes less than 2 seconds.  Neurological:     Mental Status: She is alert and oriented to person, place, and time.  Psychiatric:        Mood and Affect: Mood and affect and mood normal.     Cranial nerves II through XII grossly intact.  Patient has full strength of her right upper extremity compared to the left which is contracted which she has minimal strength in.  Patient also has  significantly less strength in the left hip compared to the right which is baseline per patient and daughter.  Sensation is intact to light touch of all extremities.  Patient is mild tremor in her right upper extremity which is also baseline per patient and daughter. ____________________________________________   LABS (all labs ordered are listed, but only abnormal results are displayed)  Labs Reviewed  CBC - Abnormal; Notable for the following components:      Result Value   Hemoglobin 11.9 (*)    MCHC 29.5 (*)    All other components within normal limits  COMPREHENSIVE METABOLIC PANEL - Abnormal; Notable for the following components:   Potassium 3.4 (*)    Chloride 91 (*)    CO2 37 (*)    Creatinine, Ser 1.34 (*)    Calcium 8.8 (*)    GFR, Estimated 40 (*)    All other components within normal limits   ____________________________________________  EKG  ____________________________________________  RADIOLOGY  ED MD interpretation: CT head shows no evidence of acute intracranial hemorrhage subacute CVA or other acute intracranial process.  Official radiology report(s): CT Head Wo Contrast  Result Date: 11/24/2020 CLINICAL DATA:  Daughter brought pt in due to tremor flare up. H/o parkinsonsSeizure, nontraumatic (Age >= 41y) EXAM: CT HEAD WITHOUT CONTRAST TECHNIQUE: Contiguous axial images were obtained from the base of the skull through the vertex without intravenous contrast. COMPARISON:  None. FINDINGS: Brain: No acute intracranial hemorrhage. No focal mass lesion. No CT evidence of acute infarction. No midline shift or mass effect. No hydrocephalus. Basilar cisterns are patent. There are periventricular and subcortical white matter hypodensities. Generalized cortical atrophy. Vascular: No hyperdense vessel or unexpected calcification. Skull: Normal. Negative for fracture or focal lesion. Sinuses/Orbits: Paranasal sinuses and mastoid air cells are clear. Orbits are clear. Other:  None. IMPRESSION: 1. No acute intracranial findings. 2. 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 Electronically Signed   By: Loura Halt.D.  On: 11/24/2020 11:32    ____________________________________________   PROCEDURES  Procedure(s) performed (including Critical Care):  Procedures   ____________________________________________   INITIAL IMPRESSION / ASSESSMENT AND PLAN / ED COURSE      Patient presents with left history exam for assessment of concern for possible abnormal movements that seem slightly more than her typical Parkinson tremors.  She is accompanied by her daughter who assists in providing a significant history of as above.  She is afebrile hemodynamically stable on exam.  She does have above-noted neurovascular deficits which are chronic per patient and daughter and there is no abnormal tremor or seizure-like activity visualized on my assessment.  No historical exam findings such as acute infectious pathology at this time.  No history of recent falls.  Very low suspicion for toxic ingestion.  CBC is unremarkable and has no evidence of leukocytosis.  CMP shows no significant electrolyte or metabolic derangements.  Kidney function is at baseline.  CT head shows no evidence of acute hemorrhage.  Patient appears to be at her neurological baseline and has no apparent focal deficits Suspicion for acute CVA.  Instantly possible patient is having small seizures versus progression of her Parkinson's disease.  I discussed this with on-call neurologist Dr. Jerrell Belfast I recommended initiating low-dose Keppra 500 twice daily and outpatient neurology follow-up for possible EEG.  Keppra given.  Patient discharged stable condition.  Strict return cautions provided in writing.  ____________________________________________   FINAL CLINICAL IMPRESSION(S) / ED DIAGNOSES  Final diagnoses:  Abnormal movement    Medications  levETIRAcetam (KEPPRA) tablet 500 mg (has  no administration in time range)     ED Discharge Orders         Ordered    levETIRAcetam (KEPPRA) 500 MG tablet  2 times daily        11/24/20 1138           Note:  This document was prepared using Dragon voice recognition software and may include unintentional dictation errors.   Gilles Chiquito, MD 11/24/20 1140    Gilles Chiquito, MD 11/24/20 [phone]

## 2020-11-24 NOTE — ED Notes (Signed)
Pt's daughter notified of pt leaving.

## 2021-01-17 ENCOUNTER — Non-Acute Institutional Stay: Payer: Medicare PPO | Admitting: Primary Care

## 2021-01-17 ENCOUNTER — Other Ambulatory Visit: Payer: Self-pay

## 2021-01-17 DIAGNOSIS — R5381 Other malaise: Secondary | ICD-10-CM

## 2021-01-17 DIAGNOSIS — R251 Tremor, unspecified: Secondary | ICD-10-CM | POA: Insufficient documentation

## 2021-01-17 DIAGNOSIS — G20A1 Parkinson's disease without dyskinesia, without mention of fluctuations: Secondary | ICD-10-CM | POA: Insufficient documentation

## 2021-01-17 DIAGNOSIS — Z515 Encounter for palliative care: Secondary | ICD-10-CM

## 2021-01-17 DIAGNOSIS — G20 Parkinson's disease: Secondary | ICD-10-CM | POA: Insufficient documentation

## 2021-01-17 NOTE — Progress Notes (Signed)
[facility] Pall[facility] Consult Note Telephone: [phone]  Fax: [phone]    Date of encounter: 01/17/21 PATIENT NAME: Mikayla [name] Mikayla [name] Mikayla [name] Mikayla [name] [address]   [phone] (home)  DOB: July 11, 1941 MRN: [id number] PRIMARY CARE PROVIDER:    Gennie Alma, MD,  [place] [address] [place]:   Mikayla [name], [place],   [address] [phone]  RESPONSIBLE PARTY:    Contact Information    Name Relation Home Work Mobile   Mikayla [name] Daughter [phone]     Mikayla [name] Daughter [phone]        I met face to face with patient in the facility. Palliative Care was asked to follow this patient by consultation request of  Mikayla Alma, MD to address advance care planning and complex medical decision making. This is the follow up visit.     ASSESSMENT AND RECOMMENDATIONS:   1. Advance Care Planning/Goals of Care: Goals include to maximize quality of life and symptom management. Our advance care planning conversation included a discussion about:     Exploration of personal, cultural or spiritual beliefs that might influence medical decisions   Exploration of goals of care in the event of a sudden injury or illness   We discussed her underlying disease progression of Parkinson's disease and that eventually there would be more and more reduced function. Daughter outlined their goals as having a good quality of life. As long as the patient could enjoy food and family, being up in her chair and interacting with others, she considers that a good quality of life. We can continue to discuss  medication side effects that  could impact affect,  but I encouraged her to consult with neurology.  2. Symptom Management:   I met with patient in her nursing home room. She was eating her lunch but was able to talk with me for a while. She states her appetite  has been good and she is able to feed herself all of her meals. She usually prefers to stay in bed but is OOB several times a week. She's conversant but I do see a slight flattening in her affect vs the last 6 to 8 months. I reviewed all of her medication.   Myoclonus: I spoke with patient's daughter and POA. She outlined recent events whereby patient was having some persistent myoclonus and  they transported to the ER. CT was normal for patient and she was put on Keppra by the ED physician. Daughter endorses she also had a Covid infection very soon thereafter and wanted to know if the myoclonus could be from the Covid.   We discussed the unknown trajectory of some of the symptomatology of Covid. We discussed the side effects of Keppra which would include flattening the affect. Daughter wanted to reduce the dose to half. We discussed the concept of therapeutic dosing especially with anticonvulsants. I would recommend that they keep their neurology appointment,  which is set for May. If family would like to try to wean of the [place] knowing that if the activity were to recur, she would likely need to stay on the [place]. I would be amenable to that as a de-escalation of treatment if concordant with care goals.    3. Follow up Palliative Care Visit: Palliative care will continue to follow for goals of care clarification and symptom management. Return 8 weeks or prn.  I spent 35 minutes providing this consultation,  from 1200 to 1235.  More than 50% of the time in this consultation was spent in counseling and care coordination.  CODE STATUS: DNR  PPS: 30%  HOSPICE ELIGIBILITY/DIAGNOSIS: TBD  CHIEF COMPLAINT: Tremors history  HISTORY OF PRESENT ILLNESS:  Mikayla [name] is a 80 y.o. year old female  with debility, PD, contracture of hand, tremors. Has had tremors in the context of PD and covid infection in early Feb 22. Has been on keppra since ed trip for tremors. These have not recurred .     History obtained from review of EMR, discussion with primary team, and  interview with family, caregiver  and/or Ms. Mikayla [name]. Records reviewed and summarized above.   CURRENT PROBLEM LIST:  Patient Active Problem List   Diagnosis Date Noted  . Parkinson's disease ([place]) 01/17/2021  . Occasional tremors 01/17/2021  . General body deterioration 12/02/2018  . Shortness of breath 12/02/2018  . HTN (hypertension) 11/02/2018  . PAF (paroxysmal atrial fibrillation) ([place]) 11/02/2018  . Renal hemorrhage, right 11/02/2018  . Acute cholangitis   . Calculus of bile duct with acute cholangitis with obstruction   . Severe sepsis ([place]) 08/19/2018  . Acute respiratory failure ([place]) 08/10/2018  . Calculus of bile duct without cholangitis or cholecystitis with obstruction   . Cellulitis 11/20/2016  . Pressure injury of skin 09/30/2016  . Palliative care encounter   . Goals of care, counseling/discussion   . Encounter for hospice care discussion   . 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 Levaquin [Levofloxacin]   . Other Nausea And Vomiting  . Sulfa Antibiotics Hives  . Oxycodone Anxiety and Other (See Comments)     PERTINENT MEDICATIONS:  Outpatient Encounter Medications as of 01/17/2021  Medication Sig  . acetaminophen (TYLENOL) 325 MG tablet Take 650 mg by mouth 3 (three) times daily.  Marland Kitchen albuterol (VENTOLIN HFA) 108 (90 Base) MCG/ACT inhaler Inhale 2 puffs into the lungs every 2 (two) hours as needed for wheezing or shortness of breath. (Patient not taking: Reported on 11/01/2020)  . amiodarone (PACERONE) 200 MG tablet Take 1 tablet (200 mg total) by mouth daily.  . carbidopa-levodopa (SINEMET IR) 10-100 MG tablet Take 1 tablet by mouth 3 (three) times daily. (Patient taking differently: Take 1 tablet by mouth 4 (four) times daily.)  . carvedilol (COREG) 3.125 MG tablet Take 1 tablet (3.125 mg total) by mouth 2 (two) times daily with a meal.   . ferrous sulfate 324 (65 Fe) MG TBEC Take 1 tablet by mouth daily.  . furosemide (LASIX) 20 MG tablet Take 1 tablet (20 mg total) by mouth daily.  Marland Kitchen gabapentin (NEURONTIN) 300 MG capsule Take 300 mg by mouth at bedtime.  . Ipratropium-Albuterol (COMBIVENT RESPIMAT) 20-100 MCG/ACT AERS respimat Inhale 1 puff into the lungs 3 (three) times daily.  Marland Kitchen levETIRAcetam (KEPPRA) 500 MG tablet Take 1 tablet (500 mg total) by mouth 2 (two) times daily.  . Multiple Vitamins-Minerals (CENTROVITE) TABS Take 1 tablet by mouth daily.  Marland Kitchen nystatin (MYCOSTATIN/NYSTOP) powder Apply topically 2 (two) times daily as needed. APPLY UNDER NECK TWICE DAILY AS NEEDED FOR YEAST  . omeprazole (PRILOSEC OTC) 20 MG tablet Take 20 mg by mouth 2 (two) times daily.  Marland Kitchen PARoxetine (PAXIL) 30 MG tablet Take 30 mg by mouth daily.   Marland Kitchen senna (SENOKOT) 8.6 MG TABS tablet Take 2 tablets by mouth at bedtime.   Marland Kitchen zinc oxide 20 % ointment Apply 1 application  topically every 8 (eight) hours. To groin and thighs each shift   No facility-administered encounter medications on file as of 01/17/2021.    ROS   General: NAD ENMT: denies dysphagia Pulmonary: denies cough, denies increased SOB Abdomen: endorses good appetite, denies constipation, endorses incontinence of bowel GU: denies dysuria, endorses incontinence of urine MSK:  denies weakness,  no falls reported, oob to chair Skin: denies rashes or wounds Neurological: denies pain, denies insomnia Psych: Endorses positive mood  Physical Exam: Current and past weights:stable at 240 lbs Constitutional:  NAD General: frail appearing, obese  CV:  no LE edema Pulmonary: no increased work of breathing, no cough, room air Abdomen: intake 100%,  no ascites MSK:  moves all extremities,  Non ambulatory Skin: warm and dry, no rashes or wounds on visible skin Neuro:  generalized weakness,  Mild  cognitive impairment Psych: non-anxious affect, A and O x 2-3  Thank you for the  opportunity to participate in the care of Ms. Akard.  The palliative care team will continue to follow. Please call our office at [phone] if we can be of additional assistance.   Jason Coop, NP , DNP, MPH, AGPCNP-BC, ACHPN    COVID-19 PATIENT SCREENING TOOL Asked and  negative response unless otherwise noted:   Have you had symptoms of covid, tested positive or been in contact with someone with symptoms/positive test in the past 5-10 days?

## 2021-03-04 ENCOUNTER — Other Ambulatory Visit
Admission: RE | Admit: 2021-03-04 | Discharge: 2021-03-04 | Disposition: A | Discharge status: Home / Self Care | Payer: Medicare PPO | Source: Other Acute Inpatient Hospital | Attending: Family Medicine | Admitting: Family Medicine

## 2021-03-04 DIAGNOSIS — R509 Fever, unspecified: Secondary | ICD-10-CM | POA: Diagnosis present

## 2021-03-04 DIAGNOSIS — R0989 Other specified symptoms and signs involving the circulatory and respiratory systems: Secondary | ICD-10-CM | POA: Diagnosis not present

## 2021-03-04 LAB — CBC WITH DIFFERENTIAL/PLATELET
Abs Immature Granulocytes: 0.1 10*3/uL — ABNORMAL HIGH (ref 0.00–0.07)
Basophils Absolute: 0.1 10*3/uL (ref 0.0–0.1)
Basophils Relative: 1 %
Eosinophils Absolute: 0.1 10*3/uL (ref 0.0–0.5)
Eosinophils Relative: 0 %
HCT: 38.2 % (ref 36.0–46.0)
Hemoglobin: 11.9 g/dL — ABNORMAL LOW (ref 12.0–15.0)
Immature Granulocytes: 1 %
Lymphocytes Relative: 8 %
Lymphs Abs: 1.2 10*3/uL (ref 0.7–4.0)
MCH: 29.7 pg (ref 26.0–34.0)
MCHC: 31.2 g/dL (ref 30.0–36.0)
MCV: 95.3 fL (ref 80.0–100.0)
Monocytes Absolute: 0.9 10*3/uL (ref 0.1–1.0)
Monocytes Relative: 6 %
Neutro Abs: 12.9 10*3/uL — ABNORMAL HIGH (ref 1.7–7.7)
Neutrophils Relative %: 84 %
Platelets: 185 10*3/uL (ref 150–400)
RBC: 4.01 MIL/uL (ref 3.87–5.11)
RDW: 13.7 % (ref 11.5–15.5)
WBC: 15.2 10*3/uL — ABNORMAL HIGH (ref 4.0–10.5)
nRBC: 0 % (ref 0.0–0.2)

## 2021-03-04 LAB — BASIC METABOLIC PANEL
Anion gap: 13 (ref 5–15)
BUN: 19 mg/dL (ref 8–23)
CO2: 30 mmol/L (ref 22–32)
Calcium: 8.7 mg/dL — ABNORMAL LOW (ref 8.9–10.3)
Chloride: 94 mmol/L — ABNORMAL LOW (ref 98–111)
Creatinine, Ser: 1.4 mg/dL — ABNORMAL HIGH (ref 0.44–1.00)
GFR, Estimated: 38 mL/min — ABNORMAL LOW (ref >60)
Glucose, Bld: 106 mg/dL — ABNORMAL HIGH (ref 70–99)
Potassium: 4.2 mmol/L (ref 3.5–5.1)
Sodium: 137 mmol/L (ref 135–145)

## 2021-04-18 ENCOUNTER — Other Ambulatory Visit: Payer: Self-pay

## 2021-04-18 ENCOUNTER — Non-Acute Institutional Stay: Payer: Medicare PPO | Admitting: Primary Care

## 2021-04-18 DIAGNOSIS — R5381 Other malaise: Secondary | ICD-10-CM

## 2021-04-18 DIAGNOSIS — Z515 Encounter for palliative care: Secondary | ICD-10-CM

## 2021-04-18 NOTE — Progress Notes (Signed)
[facility] Palliative Care Consult Note Telephone: [phone]  Fax: [phone]    Date of encounter: 04/18/21 PATIENT NAME: Mikayla [name] [place] [address]   [phone] (home)  DOB: 1941-02-01 MRN: [id number] PRIMARY CARE PROVIDER:    Gennie Alma, MD,  [place] [address] [place]:   Gennie Alma, [place],  [place] [address] [phone]  RESPONSIBLE PARTY:    Contact Information     Name Relation Home Work Mobile   [place] Daughter [phone]     Atkinson,[name] Daughter [phone]          I met face to face with patient in the [facility] facility. Palliative Care was asked to follow this patient by consultation request of  Gennie Alma, MD to address advance care planning and complex medical decision making. This is a follow up visit.                                   ASSESSMENT AND PLAN / RECOMMENDATIONS:   Advance Care Planning/Goals of Care: Goals include to maximize quality of life and symptom management. Our advance care planning conversation included a discussion about:     Experiences with loved ones who have been seriously ill or have died  Exploration of personal, cultural or spiritual beliefs that might influence medical decisions  Exploration of goals of care in the event of a sudden injury or illness  CODE STATUS: DNR  Symptom Management/Plan:  I met with patient in her nursing home room. She just finished her meal. She appears more fatigued than our previous interview although she denies feeling uncomfortable. She said she enjoys watching birds or did out her window until the squirrels knocked down her birdfeeder. She's able to explain to me what her family is doing and what her grandson summer plans are.    Mobility: She endorses getting up to her recliner chair several times a week but not daily.  Encouraged to be  OOB as much as possible.  Dyspnea: Continues with continual oxygen, 2L   Constipation: She endorses constipation and is currently on two Sennas. I told her we could increase her laxatives with a half dose of MiraLAX. She was afraid to try too strong of a laxative but is interested in a modest increase in order to become a regular.  I have faxed an order to the SNF  for Miralax half dose (8.5 gm) po mixed in 4 oz liquid daily for constipation. May hold for loose stool. Dispense # 238 gm, 3 refills. Product selection permitted.  I was not able to ascertain the pharmacy to use.  Seizures: Discussed with family RE recent wean of keppra. No seizure activity and family endorses a return to her normal personality.  Follow up Palliative Care Visit: Palliative care will continue to follow for complex medical decision making, advance care planning, and clarification of goals. Return 12 weeks or prn.  I spent 35 minutes providing this consultation. More than 50% of the time in this consultation was spent in counseling and care coordination.  PPS: 30%  HOSPICE ELIGIBILITY/DIAGNOSIS: TBD  Chief Complaint: Debility  HISTORY OF PRESENT ILLNESS:  GLINDA [name] is a 80 y.o. year old female  with PD, obesity, OA, CHF, a fib, isolated seizure with Covid 19 infection.   History obtained from review of EMR, discussion  with primary team, and interview with family, facility staff/caregiver and/or Ms. Belmontes.  I reviewed available labs, medications, imaging, studies and related documents from the EMR.  Records reviewed and summarized above.   ROS   General: NAD ENMT: denies dysphagia Cardiovascular: denies chest pain, denies DOE Pulmonary: denies cough, denies increased SOB Abdomen: endorses good appetite, endorses constipation, endorses incontinence of bowel GU: denies dysuria, endorses incontinence of urine MSK:  endorses weakness,  no falls reported Skin: denies rashes or wounds Neurological:  denies pain, denies insomnia Psych: Endorses positive mood Heme/lymph/immuno: denies bruises, abnormal bleeding  Physical Exam: Current and past weights: 215 lbs Constitutional: NAD General: frail appearing, obese  EYES: anicteric sclera, lids intact, no discharge  ENMT: intact hearing, oral mucous membranes moist, dentition intact CV: S1S2, RRR, no LE edema Pulmonary: LCTA, no increased work of breathing, no cough, oxygen continuous Abdomen: intake 50%,  no ascites GU: deferred MSK: mild sarcopenia, OOB to chair several times a week,  non ambulatory Skin: warm and dry, no rashes or wounds on visible skin Neuro:  ++ generalized weakness,  ++ cognitive impairment Psych: non-anxious affect, A and O x 2-3, more alert Hem/lymph/immuno: no widespread bruising  Thank you for the opportunity to participate in the care of Mikayla [name].  The palliative care team will continue to follow. Please call our office at [phone] if we can be of additional assistance.   Jason Coop, NP , DNP, MPH, AGPCNP-BC, ACHPN  COVID-19 PATIENT SCREENING TOOL Asked and negative response unless otherwise noted:   Have you had symptoms of covid, tested positive or been in contact with someone with symptoms/positive test in the past 5-10 days?

## 2021-07-29 ENCOUNTER — Non-Acute Institutional Stay: Payer: Medicare PPO | Admitting: Primary Care

## 2021-07-29 ENCOUNTER — Other Ambulatory Visit: Payer: Self-pay

## 2021-07-29 DIAGNOSIS — Z515 Encounter for palliative care: Secondary | ICD-10-CM

## 2021-07-29 DIAGNOSIS — G20A1 Parkinson's disease without dyskinesia, without mention of fluctuations: Secondary | ICD-10-CM

## 2021-07-29 DIAGNOSIS — R5381 Other malaise: Secondary | ICD-10-CM

## 2021-07-29 DIAGNOSIS — G20 Parkinson's disease: Secondary | ICD-10-CM

## 2021-07-29 NOTE — Progress Notes (Signed)
[facility] Palliative [facility] Consult Note Telephone: [phone]  Fax: [phone]    Date of encounter: 07/29/21 12:14 PM PATIENT NAME: Mikayla [name] [place] [address]   [phone] (home)  DOB: 11/23/1940 MRN: [id number] PRIMARY CARE PROVIDER:    Gennie Alma, MD,  [place] [address] [place]:   Gennie Alma, [place],  [place] [address] [phone]  RESPONSIBLE PARTY:    Contact Information     Name Relation Home Work Mobile   [place] Daughter [phone]     Atkinson,[name] Daughter [phone]          I met face to face with patient in [facility] facility. Palliative Care was asked to follow this patient by consultation request of  Gennie Alma, MD to address advance care planning and complex medical decision making. This is a follow up visit.                                   ASSESSMENT AND PLAN / RECOMMENDATIONS:   Advance Care Planning/Goals of Care: Goals include to maximize quality of life and symptom management. Our advance care planning conversation included a discussion about:    Experiences with loved ones who have been seriously ill or have died  Exploration of personal, cultural or spiritual beliefs that might influence medical decisions  CODE STATUS: DNR  Symptom Management/Plan:   I met with patient in her nursing home room. She just finished her meal. She is dozing but awakens well and interacts. Patient denies pain and nausea. Expresses that her family is doing well.    Mobility: She endorses getting up to her recliner chair several times  daily. Encouraged to be OOB as much as possible.   Dyspnea: Continues with continual oxygen, 2L    Constipation: Controlled with current bowel regimen.   Seizures: Weaned off keppra, has not has seizures since, per staff.  Somnolence: Decreased from gabapentin 300 mg to 100 mg  for hs sleep and pain. If she has pain may increase back. Pt asked to de-prescibe for somnolence. Order left with Lucia Estelle, RN  Follow up Palliative Care Visit: Palliative care will continue to follow for complex medical decision making, advance care planning, and clarification of goals. Return 4 weeks or prn.  I spent 25 minutes providing this consultation. More than 50% of the time in this consultation was spent in counseling and care coordination.  PPS: 30%  HOSPICE ELIGIBILITY/DIAGNOSIS: no  Chief Complaint: immobility  HISTORY OF PRESENT ILLNESS:  Mikayla [name] is a 80 y.o. year old female  with PD, immobility, mild to moderate cognitive loss, obesity .   History obtained from review of EMR, discussion with primary team, and interview with family, facility staff/caregiver and/or Ms. Dave.  I reviewed available labs, medications, imaging, studies and related documents from the EMR.  Records reviewed and summarized above.   ROS   General: NAD EYES: denies vision changes ENMT: denies dysphagia Cardiovascular: denies chest pain, denies DOE Pulmonary: denies cough, denies increased SOB Abdomen: endorses good appetite, denies constipation, endorses incontinence of bowel GU: denies dysuria, endorses incontinence of urine MSK:  denies weakness,  no falls reported Skin: denies rashes or wounds Neurological: denies pain, denies insomnia, endorses increased hs somnolence from medications Psych: Endorses positive mood Heme/lymph/immuno: denies bruises, abnormal bleeding  Physical Exam: Current and past  weights:203 lbs Constitutional: NAD General: frail appearing, obese  EYES: anicteric sclera, lids intact, no discharge  ENMT: intact hearing, oral mucous membranes moist, dentition intact CV:  no LE edema Pulmonary:  no increased work of breathing, no cough, supplemental oxygen Abdomen: intake 75%,no ascites GU: deferred IRJ:[id number] sarcopenia, moves all extremities,  non  ambulatory Skin: warm and dry, no rashes or wounds on visible skin Neuro:  +generalized weakness,  mild cognitive impairment, UA tremors Psych: non-anxious affect, A and O x 2-3 Hem/lymph/immuno: no widespread bruising  Thank you for the opportunity to participate in the care of [name].  The palliative care team will continue to follow. Please call our office at [phone] if we can be of additional assistance.   Jason Coop, NP   COVID-19 PATIENT SCREENING TOOL Asked and negative response unless otherwise noted:   Have you had symptoms of covid, tested positive or been in contact with someone with symptoms/positive test in the past 5-10 days?

## 2021-11-07 ENCOUNTER — Other Ambulatory Visit: Payer: Self-pay

## 2021-11-07 ENCOUNTER — Other Ambulatory Visit: Payer: Medicare PPO | Admitting: Primary Care

## 2021-11-07 VITALS — Ht 63.0 in | Wt 195.6 lb

## 2021-11-07 DIAGNOSIS — G20A1 Parkinson's disease without dyskinesia, without mention of fluctuations: Secondary | ICD-10-CM

## 2021-11-07 DIAGNOSIS — G20 Parkinson's disease: Secondary | ICD-10-CM

## 2021-11-07 DIAGNOSIS — R5381 Other malaise: Secondary | ICD-10-CM

## 2021-11-07 DIAGNOSIS — Z515 Encounter for palliative care: Secondary | ICD-10-CM

## 2021-11-07 NOTE — [facility] Notes (Signed)
[facility] Palliative Care Consult Note Telephone: [phone]  Fax: [phone]    Date of encounter: 11/07/21 11:00 am PATIENT NAME: Mikayla [name]   [phone] (home)  DOB: 1941/09/13 MRN: [id number] PRIMARY CARE PROVIDER:    Gennie Alma, MD,  [place] [address] [place]:   Mikayla [name], [place],   [address] [phone]  RESPONSIBLE PARTY:    Contact Information     Name Relation Home Work Mobile   Storrs Daughter [phone]     [name],Mikayla Daughter [phone]          I met face to face with patient  in [place]. Palliative Care was asked to follow this patient by consultation request of  Mikayla Alma, MD to address advance care planning and complex medical decision making. This is a follow up visit.                                   ASSESSMENT AND PLAN / RECOMMENDATIONS:   Advance Care Planning/Goals of Care: Goals include to maximize quality of life and symptom management. Patient/health care surrogate gave his/her permission to discuss.Our advance care planning conversation included a discussion about:    Experiences with loved ones who have been seriously ill or have died  Exploration of personal, cultural or spiritual beliefs that might influence medical decisions  Exploration of goals of care in the event of a sudden injury or illness  Identification of a healthcare agent - Mikayla [name]  Review of an  dvance directive document .  CODE STATUS: DNR  I reviewed  a MOST form today. The patient and family outlined their wishes for the following treatment decisions:  Cardiopulmonary Resuscitation: Do Not Attempt Resuscitation (DNR/No CPR)  Medical Interventions: Limited Additional Interventions: Use medical treatment, IV fluids and cardiac monitoring as indicated, DO NOT USE intubation or  mechanical ventilation. May consider use of less invasive airway support such as BiPAP or CPAP. Also provide comfort measures. Transfer to the hospital if indicated. Avoid intensive care.   Antibiotics: Determine use of limitation of antibiotics when infection occurs  IV Fluids: IV fluids for a defined trial period  Feeding Tube: No feeding tube    Symptom Management/Plan:  Patient states she feels well, looks alert and is interactive. Endorses getting oob to chair most days. Daughter reached by phone, endorses that she is happy with current care plan and medications are stable . No recommendations for medications changes. Continue to consult for pc needs per family and facility request.   Follow up Palliative Care Visit: Palliative care will continue to follow for complex medical decision making, advance care planning, and clarification of goals. Return 8 weeks or prn.  This visit was coded based on medical decision making (MDM).  PPS: 30%  HOSPICE ELIGIBILITY/DIAGNOSIS: TBD  Chief Complaint: debility  HISTORY OF PRESENT ILLNESS:  Mikayla [name] is a 81 y.o. year old female  with PD, tremors, oxygen dependence, immobility, obesity. Followed for ongoing palliative care needs.   History obtained from review of EMR, discussion with primary team, and interview with family, facility staff/caregiver and/or Mikayla [name].  I reviewed available labs, medications, imaging, studies and related documents from the EMR.  Records reviewed and summarized above.   ROS/staff  General: NAD ENMT: denies dysphagia Pulmonary: denies cough, denies increased  Abdomen: endorses good appetite, denies constipation, endorses incontinence of bowel °GU: denies dysuria, endorses incontinence of urine °MSK:  denies  increased weakness,  no falls reported °Skin: denies rashes or wounds °Neurological: denies pain, denies insomnia °Psych: Endorses positive mood °Heme/lymph/immuno: denies bruises, abnormal  bleeding ° °Physical Exam: °Current and past weights: 195.6 lbs °Body mass index is 34.65 kg/m². ° °Constitutional: NAD °General: frail appearing °EYES: anicteric sclera, lids intact, no discharge  °ENMT: intact hearing, oral mucous membranes moist °CV: S1S2, RRR, no LE edema °Pulmonary:  no increased work of breathing, no cough, room air °Abdomen: intake 75-100%, no ascites, albumin 3.6 °GU: deferred, GFR 36 in 12/22 °MSK: mild  sarcopenia, moves all extremities, non ambulatory °Skin: warm and dry, no rashes or wounds on visible skin °Neuro:  + generalized weakness,  + cognitive impairment, occ tremor of hand °Psych: non-anxious affect, A and O x 2-3 °Hem/lymph/immuno: no widespread bruising ° °Outpatient Encounter Medications as of 11/07/2021  °Medication Sig  ° acetaminophen (TYLENOL) 325 MG tablet Take 650 mg by mouth 3 (three) times daily.  ° amiodarone (PACERONE) 200 MG tablet Take 1 tablet (200 mg total) by mouth daily.  ° ammonium lactate (AMLACTIN) 12 % cream Apply 1 application topically as needed for dry skin.  ° carbidopa-levodopa (SINEMET IR) 25-100 MG tablet Take 1 tablet by mouth 3 (three) times daily.  ° carvedilol (COREG) 3.125 MG tablet Take 1 tablet (3.125 mg total) by mouth 2 (two) times daily with a meal.  ° ferrous sulfate 324 (65 Fe) MG TBEC Take 1 tablet by mouth daily.  ° furosemide (LASIX) 20 MG tablet Take 1 tablet (20 mg total) by mouth daily.  ° gabapentin (NEURONTIN) 100 MG capsule Take 100 mg by mouth at bedtime.  ° guaiFENesin (MUCINEX) 600 MG 12 hr tablet Take 600 mg by mouth 2 (two) times daily as needed for cough.  ° Ipratropium-Albuterol (COMBIVENT RESPIMAT) 20-100 MCG/ACT AERS respimat Inhale 1 puff into the lungs 3 (three) times daily.  ° ipratropium-albuterol (DUONEB) 0.5-2.5 (3) MG/3ML SOLN Take 3 mLs by nebulization every 6 (six) hours as needed.  ° Multiple Vitamins-Minerals (CENTROVITE) TABS Take 1 tablet by mouth daily.  ° omeprazole (PRILOSEC OTC) 20 MG tablet Take 20 mg  by mouth 2 (two) times daily.  ° OXYGEN Inhale 2 L/min into the lungs continuous.  ° PARoxetine (PAXIL) 30 MG tablet Take 30 mg by mouth daily.   ° senna (SENOKOT) 8.6 MG TABS tablet Take 2 tablets by mouth at bedtime.   ° zinc oxide 20 % ointment Apply 1 application topically every 8 (eight) hours. To groin and thighs each shift  ° nystatin (MYCOSTATIN/NYSTOP) powder Apply topically 2 (two) times daily as needed. APPLY UNDER NECK TWICE DAILY AS NEEDED FOR YEAST (Patient not taking: Reported on 04/18/2021)  ° [DISCONTINUED] albuterol (VENTOLIN HFA) 108 (90 Base) MCG/ACT inhaler Inhale 2 puffs into the lungs every 2 (two) hours as needed for wheezing or shortness of breath.  ° °No facility-administered encounter medications on file as of 11/07/2021.  ° ° ° °Thank you for the opportunity to participate in the care of Mikayla [name].  The palliative care team will continue to follow. Please call our office at [phone] if we can be of additional assistance.  ° ° [place] , NP DNP, AGPCNP-BC ° °COVID-19 PATIENT SCREENING TOOL °Asked and negative response unless otherwise noted:  ° °Have you had symptoms of covid, tested positive or been in contact with someone with symptoms/positive test in the   past 5-10 days?  ° °

## 2022-01-02 ENCOUNTER — Other Ambulatory Visit: Payer: Self-pay

## 2022-01-02 ENCOUNTER — Non-Acute Institutional Stay: Payer: Medicare PPO | Admitting: Primary Care

## 2022-01-02 DIAGNOSIS — G20A1 Parkinson's disease without dyskinesia, without mention of fluctuations: Secondary | ICD-10-CM

## 2022-01-02 DIAGNOSIS — R5381 Other malaise: Secondary | ICD-10-CM

## 2022-01-02 DIAGNOSIS — Z515 Encounter for palliative care: Secondary | ICD-10-CM

## 2022-01-02 DIAGNOSIS — G20 Parkinson's disease: Secondary | ICD-10-CM

## 2022-01-02 NOTE — [facility] Notes (Signed)
.  kssub ?

## 2022-01-02 NOTE — [facility] Notes (Signed)
? ? ?Man[facility] ?Community Palliative Care Consult Note ?Telephone: [phone]  ?Fax: [phone]  ? ? ?Date of encounter: 01/02/22 ?12:18 PM ?PATIENT NAME: Mikayla [name] ?[address] [address] ?[place] [address]   ?[phone] (home)  ?DOB: [birth year] ?MRN: [id number] ?PRIMARY CARE PROVIDER:    ?Mikayla Alma, MD,  ?[address] ? [place] [address] ?[phone] ? ?REFERRING PROVIDER:   ?Mikayla Alma, MD ?[address] ?[place],  [place] [address] ?[phone] ? ?RESPONSIBLE PARTY:    ?Contact Information   ? ? Name Relation Home Work Mobile  ? Mikayla [name] Daughter [phone]    ? Mikayla [name] Daughter [phone]    ? ?  ? ? ? ?I met face to face with patient in [facility] facility. Palliative Care was asked to follow this patient by consultation request of  Mikayla Alma, MD to address advance care planning and complex medical decision making. This is a follow up visit. ? ?                                 ASSESSMENT AND PLAN / RECOMMENDATIONS:  ? ?Advance Care Planning/Goals of Care: Goals include to maximize quality of life and symptom management.  ?CODE STATUS: DNR ? ?Symptom Management/Plan: ? ?Staff endorses patient does well. She is able to make her needs known and is enjoying her quality of life. Today she is dozing during my assessment, but endorse she does not have any concerns . Slight weight loss, 6.5%, over 6 months.  ? ?Follow up Palliative Care Visit: Palliative care will continue to follow for complex medical decision making, advance care planning, and clarification of goals. Return 8 weeks or prn. ? ?This visit was coded based on medical decision making (MDM). ? ?PPS: 30% ? ?HOSPICE ELIGIBILITY/DIAGNOSIS: TBD ? ?Chief Complaint: immobility and sequelae ? ?HISTORY OF PRESENT ILLNESS:  Mikayla [name] is a 81 y.o. year old female  with PD, immobility, weight loss, mild cognitive decline. Drowsy today but endorses she's doing well . Patient seen today to  review palliative care needs to include medical decision making and advance care planning as appropriate.  ? ?History obtained from review of EMR, discussion with primary team, and interview with family, facility staff/caregiver and/or Mikayla [name].  ?I reviewed available labs, medications, imaging, studies and related documents from the EMR.  Records reviewed and summarized above.  ? ?ROS ? ? ?General: NAD ?ENMT: denies dysphagia ?Pulmonary: denies cough, denies increased SOB ?Abdomen: endorses good appetite, denies constipation, endorses incontinence of bowel ?GU: denies dysuria, endorses incontinence of urine ?MSK:  denies increased weakness, no falls reported ?Skin: denies rashes or wounds ?Neurological: denies pain, denies insomnia ?Psych: Endorses positive mood ? ?Physical Exam: ?Current and past weights:189.60 lbs, was 202 lbs 5 months ago, 6% loss. ?Constitutional: NAD ?General: frail appearing, obese  ?EYES: anicteric sclera, lids intact, no discharge  ?ENMT: intact hearing, oral mucous membranes moist, dentition intact ?CV:no LE edema ?Pulmonary: no increased work of breathing, no cough, room air ?Abdomen: intake 75%, no ascites ?MSK: no sarcopenia, moves all extremities, with difficulty  non ambulatory ?Skin: warm and dry, no rashes or wounds on visible skin ?Neuro:  no new generalized weakness,  mild  cognitive impairment, non-anxious affect, hand tremors ? ? ?Thank you for the opportunity to participate in the care of Mikayla [name].  The palliative care team will continue to follow. Please call our office at [phone] if we can be  of additional assistance.  ? ?Jason Coop, NP DNP, AGPCNP-BC ? ?COVID-19 PATIENT SCREENING TOOL ?Asked and negative response unless otherwise noted:  ? ?Have you had symptoms of covid, tested positive or been in contact with someone with symptoms/positive test in the past 5-10 days?  ? ?

## 2022-04-24 ENCOUNTER — Non-Acute Institutional Stay: Payer: Medicare PPO | Admitting: Primary Care

## 2022-04-24 DIAGNOSIS — G20A1 Parkinson's disease without dyskinesia, without mention of fluctuations: Secondary | ICD-10-CM

## 2022-04-24 DIAGNOSIS — G20 Parkinson's disease: Secondary | ICD-10-CM

## 2022-04-24 DIAGNOSIS — Z515 Encounter for palliative care: Secondary | ICD-10-CM

## 2022-04-24 DIAGNOSIS — R5381 Other malaise: Secondary | ICD-10-CM

## 2022-04-24 NOTE — [facility] Notes (Signed)
[facility] Palliative Care Consult Note Telephone: [phone]  Fax: [phone]    Date of encounter: 04/24/22 11:13 AM PATIENT NAME: Mikayla [name] [address] [place] [address]   [phone] (home)  DOB: October 18, 1941 MRN: [id number] PRIMARY CARE PROVIDER:    Gennie Alma, MD,  [place] [address] [place]:   Gennie Alma, [place],  [place] [address] [phone]  RESPONSIBLE PARTY:    Contact Information     Name Relation Home Work Mobile   Poc[place]ont[place] Daughter [phone]     Atkinson,[name] Daughter [phone]          I met face to face with patient in [facility] facility. Palliative Care was asked to follow this patient by consultation request of  Gennie Alma, MD to address advance care planning and complex medical decision making. This is a follow up visit.                                   ASSESSMENT AND PLAN / RECOMMENDATIONS:   Advance Care Planning/Goals of Care: Goals include to maximize quality of life and symptom management.  CODE STATUS: DNR  Symptom Management/Plan: Patient states she is doing well, oob in chair today. She is eating well and does not complain of pain.  Staff denies symptoms uncontrolled. Current and past weights: 183 lbs, 189 in march, 6 lb loss in 3 mos. Had lost 14% in past year. Continue to monitor intake and possible decline.  Medication review completed.  Follow up Palliative Care Visit: Palliative care will continue to follow for complex medical decision making, advance care planning, and clarification of goals. Return 8 weeks or prn.  I spent 15 minutes providing this consultation. More than 50% of the time in this consultation was spent in counseling and care coordination.  PPS: 30%  HOSPICE ELIGIBILITY/DIAGNOSIS: TBD  Chief Complaint: debility  HISTORY OF PRESENT ILLNESS:  Mikayla [name] is a 81 y.o.  year old female  with PD, debility, weight loss . Patient seen today to review palliative care needs to include medical decision making and advance care planning as appropriate.   History obtained from review of EMR, discussion with primary team, and interview with family, facility staff/caregiver and/or Mikayla [name].  I reviewed available labs, medications, imaging, studies and related documents from the EMR.  Records reviewed and summarized above.   ROS  General: NAD ENMT: denies dysphagia Pulmonary: denies cough, denies increased SOB Abdomen: endorses good appetite, denies constipation, endorses incontinence of bowel GU: denies dysuria, endorses incontinence of urine MSK:  denies  increased weakness,  no falls reported Skin: denies rashes or wounds Neurological: denies pain, denies insomnia Psych: Endorses positive mood  Physical Exam: Current and past weights: 183 lbs, 189 in march, 6 lb loss in 3 mos. Had lost 14% in past year. Constitutional: NAD General: frail appearing EYES: anicteric sclera, lids intact, no discharge  ENMT: intact hearing, oral mucous membranes moist CV:  no LE edema Pulmonary:, no increased work of breathing, no cough, oxygen 2 l / [place] Abdomen: intake 100%, no ascites MSK: mild  sarcopenia, moves all extremities, non ambulatory Skin: warm and dry, no rashes or wounds on visible skin Neuro:  no  new generalized weakness,  mild cognitive impairment, non-anxious affect  Outpatient Encounter Medications as of 04/24/2022  Medication Sig   acetaminophen (TYLENOL) 325  MG tablet Take 650 mg by mouth 3 (three) times daily.   amiodarone (PACERONE) 200 MG tablet Take 1 tablet (200 mg total) by mouth daily.   ammonium lactate (AMLACTIN) 12 % cream Apply 1 application topically as needed for dry skin.   carbidopa-levodopa (SINEMET IR) 25-100 MG tablet Take 1 tablet by mouth 3 (three) times daily.   carvedilol (COREG) 3.125 MG tablet Take 1 tablet (3.125 mg total) by  mouth 2 (two) times daily with a meal.   ferrous sulfate 324 (65 Fe) MG TBEC Take 1 tablet by mouth daily.   furosemide (LASIX) 20 MG tablet Take 1 tablet (20 mg total) by mouth daily.   gabapentin (NEURONTIN) 100 MG capsule Take 100 mg by mouth at bedtime.   guaiFENesin (MUCINEX) 600 MG 12 hr tablet Take 600 mg by mouth 2 (two) times daily as needed for cough.   Ipratropium-Albuterol (COMBIVENT RESPIMAT) 20-100 MCG/ACT AERS respimat Inhale 1 puff into the lungs 3 (three) times daily.   ipratropium-albuterol (DUONEB) 0.5-2.5 (3) MG/3ML SOLN Take 3 mLs by nebulization every 6 (six) hours as needed.   Multiple Vitamins-Minerals (CENTROVITE) TABS Take 1 tablet by mouth daily.   nitrofurantoin, macrocrystal-monohydrate, (MACROBID) 100 MG capsule Take 100 mg by mouth 2 (two) times daily.   omeprazole (PRILOSEC OTC) 20 MG tablet Take 20 mg by mouth 2 (two) times daily.   OXYGEN Inhale 2 L/min into the lungs continuous.   PARoxetine (PAXIL) 30 MG tablet Take 30 mg by mouth daily.    senna (SENOKOT) 8.6 MG TABS tablet Take 2 tablets by mouth at bedtime.    zinc oxide 20 % ointment Apply 1 application topically every 8 (eight) hours. To groin and thighs each shift   nystatin (MYCOSTATIN/NYSTOP) powder Apply topically 2 (two) times daily as needed. APPLY UNDER NECK TWICE DAILY AS NEEDED FOR YEAST (Patient not taking: Reported on 04/18/2021)   No facility-administered encounter medications on file as of 04/24/2022.    Thank you for the opportunity to participate in the care of Ms. Newingham.  The palliative care team will continue to follow. Please call our office at [phone] if we can be of additional assistance.   Jason Coop, NP DNP, AGPCNP-BC  COVID-19 PATIENT SCREENING TOOL Asked and negative response unless otherwise noted:   Have you had symptoms of covid, tested positive or been in contact with someone with symptoms/positive test in the past 5-10 days?

## 2022-07-09 ENCOUNTER — Non-Acute Institutional Stay: Payer: Medicare PPO | Admitting: Primary Care

## 2022-07-09 DIAGNOSIS — G20 Parkinson's disease: Secondary | ICD-10-CM

## 2022-07-09 DIAGNOSIS — Z515 Encounter for palliative care: Secondary | ICD-10-CM

## 2022-07-09 DIAGNOSIS — G20A1 Parkinson's disease without dyskinesia, without mention of fluctuations: Secondary | ICD-10-CM

## 2022-07-09 DIAGNOSIS — R251 Tremor, unspecified: Secondary | ICD-10-CM

## 2022-07-09 NOTE — [facility] Notes (Signed)
[facility] Palliative Care Consult Note Telephone: [phone]  Fax: [phone]    Date of encounter: 07/09/22 2:10 PM PATIENT NAME: Mikayla [name]   [phone] (home)  DOB: 1941-05-21 MRN: [id number] PRIMARY CARE PROVIDER:    Gennie Alma, MD,  [place] [address] [address] [place]:   Mikayla [name], [place],  [place] [address] [phone]  RESPONSIBLE PARTY:    Contact Information     Name Relation Home Work Mobile   Mikayla [name] Daughter [phone]     Mikayla [name],Mikayla [name] Daughter [phone]          I met face to face with patient in [facility] facility. Palliative Care was asked to follow this patient by consultation request of  Mikayla Alma, MD to address advance care planning and complex medical decision making. This is a follow up visit.                                   ASSESSMENT AND PLAN / RECOMMENDATIONS:   Advance Care Planning/Goals of Care: Goals include to maximize quality of life and symptom management.  Exploration of goals of care in the event of a sudden injury or illness  Identification of a healthcare agent - daughter CODE STATUS: DNR  Symptom Management/Plan:  Patient distressed about having bm, she was just cleaned but thinks she still has one. She appears more frail and confused than past visits. Appears to have lost weight visibly and record shows 20 lb loss in 9 month  and 14 lb loss in 6 months. This represents > 10% loss and would qualify for hospice services if desired. Staff endorses increase in confusion at times.  Follow up Palliative Care Visit: Palliative care will continue to follow for complex medical decision making, advance care planning, and clarification of goals. Return 6-8 weeks or prn.  This visit was coded based on medical decision making (MDM).  PPS: 30%  HOSPICE ELIGIBILITY/DIAGNOSIS:  TBD  Chief Complaint: PD, weight loss  HISTORY OF PRESENT ILLNESS:  Mikayla [name] is a 81 y.o. year old female  with PD, weight loss, immobility . Patient seen today to review palliative care needs to include medical decision making and advance care planning as appropriate.   History obtained from review of EMR, discussion with primary team, and interview with family, facility staff/caregiver and/or Mikayla [name].  I reviewed available labs, medications, imaging, studies and related documents from the EMR.  Records reviewed and summarized above.   ROS Aldean [name]  General: NAD ENMT: denies dysphagia Cardiovascular: denies chest pain, denies DOE Pulmonary: denies cough, denies increased SOB Abdomen: endorses good to fair , denies constipation, endorses incontinence of bowel GU: denies dysuria, endorses incontinence of urine MSK:  denies  increased weakness,  no falls reported Skin: denies rashes or wounds Neurological: denies pain, denies insomnia Psych: Endorses distressed  mood  Physical Exam: Current and past weights: 175.2 lbs, 20 lbs weight loss in 9 mos. Constitutional: NAD General: frail appearing EYES: anicteric sclera, lids intact, no discharge  ENMT: intact hearing, oral mucous membranes moist, CV: no LE edema Pulmonary:  no increased work of breathing, no cough, oxygen at 2 l Abdomen: intake 70%, normo-active BS + 4 quadrants, soft and non tender, no ascites MSK: mild  sarcopenia, moves all extremities, non ambulatory Skin: warm and dry, no rashes or  wounds on visible skin Neuro:  + generalized weakness,  + cognitive impairment, anxious affect   Thank you for the opportunity to participate in the care of Mikayla [name].  The palliative care team will continue to follow. Please call our office at [phone] if we can be of additional assistance.   Mikayla Coop, NP DNP, AGPCNP-BC  COVID-19 PATIENT SCREENING TOOL Asked and negative response unless otherwise  noted:   Have you had symptoms of covid, tested positive or been in contact with someone with symptoms/positive test in the past 5-10 days?

## 2022-08-24 ENCOUNTER — Non-Acute Institutional Stay: Payer: Medicare PPO | Admitting: Primary Care

## 2022-08-24 DIAGNOSIS — G20A1 Parkinson's disease without dyskinesia, without mention of fluctuations: Secondary | ICD-10-CM

## 2022-08-24 DIAGNOSIS — R0602 Shortness of breath: Secondary | ICD-10-CM

## 2022-08-24 DIAGNOSIS — Z515 Encounter for palliative care: Secondary | ICD-10-CM

## 2022-08-24 NOTE — Progress Notes (Signed)
[facility] Pall[facility] Care Consult Note Telephone: [phone]  Fax: [phone]    Date of encounter: 08/24/22 11:17 AM PATIENT NAME: Mikayla [name] [address] [place] [address]   [phone] (home)  DOB: 11/11/1940 MRN: [id number] PRIMARY CARE PROVIDER:    Gennie Alma, MD,  [place] [address] [place]:   Gennie Alma, [place] [address] [place],  [place] [address] [phone]  RESPONSIBLE PARTY:    Contact Information     Name Relation Home Work Mobile   Selah Daughter [phone]     Atkinson,[name] Daughter [phone]          I met face to face with patient in [facility] facility. Palliative Care was asked to follow this patient by consultation request of  Gennie Alma, MD to address advance care planning and complex medical decision making. This is a follow up visit.                                   ASSESSMENT AND PLAN / RECOMMENDATIONS:   Advance Care Planning/Goals of Care: Goals include to maximize quality of life and symptom management. CODE STATUS: DNR  Symptom Management/Plan:  Patient less alert than previous visits, does not appear to know who I am. She has visibly declined in weight and cognition. She may be eligible for hospice services, based on decline although weight loss is not at 10%. T/c to daughter, no answer, message left.    Follow up Palliative Care Visit: Palliative care will continue to follow for complex medical decision making, advance care planning, and clarification of goals. Return 6-8 weeks or prn.  This visit was coded based on medical decision making (MDM).  PPS: 30%  HOSPICE ELIGIBILITY/DIAGNOSIS: TBD  Chief Complaint: debility  HISTORY OF PRESENT ILLNESS:  Mikayla [name] is a 81 y.o. year old female  with PD, immobility, decreasing weight.   History obtained from review of EMR, discussion with primary team, and  interview with family, facility staff/caregiver and/or Ms. Phillippi.  I reviewed available labs, medications, imaging, studies and related documents from the EMR.  Records reviewed and summarized above.   ROS  [name]  General: NAD EYES: denies vision changes ENMT: denies dysphagia, had ST and d/c on thin liquids Cardiovascular: denies chest pain, denies DOE Pulmonary: denies cough, denies increased SOB Abdomen: endorses  fair to good appetite, denies constipation, endorses incontinence of bowel GU: denies dysuria, endorses incontinence of urine MSK:  endorses increased weakness,  no falls reported Skin: denies rashes or wounds Neurological: denies pain, denies insomnia Psych: Endorses flat  mood Heme/lymph/immuno: denies bruises, abnormal bleeding  Physical Exam: Current and past weights: 175 lbs, 1 year ago was 203 lbs,  last 4-6  months 189 lbs, 7 %, continues to lose weight at rate of 6-7% Constitutional: NAD General: frail appearing EYES: anicteric sclera, lids intact, no discharge  ENMT: intact hearing, oral mucous membranes moist CV: no LE edema Pulmonary:  no increased work of breathing, no cough, + supplemental oxygen Abdomen: intake 80%, soft and non tender, no ascites GU: deferred MSK: + sarcopenia, moves all extremities,  non ambulatory Skin: warm and dry, no rashes or wounds on visible skin Neuro:  + generalized weakness,  + cognitive impairment, increasing Psych: non-anxious affect, A and O x 1-2 Hem/lymph/immuno: no widespread bruising   Thank you for the opportunity to participate  in the care of Mikayla [name].  The palliative care team will continue to follow. Please call our office at [phone] if we can be of additional assistance.   Jason Coop, NP   COVID-19 PATIENT SCREENING TOOL Asked and negative response unless otherwise noted:   Have you had symptoms of covid, tested positive or been in contact with someone with symptoms/positive test in  the past 5-10 days?

## 2022-10-28 ENCOUNTER — Encounter: Payer: Self-pay | Admitting: Nurse Practitioner

## 2022-10-28 ENCOUNTER — Non-Acute Institutional Stay: Payer: Medicare PPO | Admitting: Nurse Practitioner

## 2022-10-28 DIAGNOSIS — Z515 Encounter for palliative care: Secondary | ICD-10-CM

## 2022-10-28 DIAGNOSIS — R630 Anorexia: Secondary | ICD-10-CM

## 2022-10-28 DIAGNOSIS — R634 Abnormal weight loss: Secondary | ICD-10-CM

## 2022-10-28 DIAGNOSIS — G20A1 Parkinson's disease without dyskinesia, without mention of fluctuations: Secondary | ICD-10-CM

## 2022-10-28 NOTE — Progress Notes (Addendum)
[facility] Palliative Care Consult Note Telephone: [phone]  Fax: [phone]    Date of encounter: 10/28/22 5:04 PM PATIENT NAME: Mikayla [name] [id number]   [phone] (home)  DOB: [birth year] MRN: [id number] PRIMARY CARE PROVIDER:    [facility]  RESPONSIBLE PARTY:    Contact Information     Name Relation Home Work Mobile   Woodlyn Daughter [phone]     Atkinson,[name] Daughter [phone]        I met face to face with patient in facility. Palliative Care was asked to follow this patient by consultation request of  Gennie Alma, MD/[place]  to address advance care planning and complex medical decision making. This is a follow up visit.                                 ASSESSMENT AND PLAN / RECOMMENDATIONS:  Symptom Management/Plan: 1. Advance Care Planning;  DNR 2. Goals of Care: Goals include to maximize quality of life and symptom management. Our advance care planning conversation included a discussion about:    The value and importance of advance care planning  Exploration of personal, cultural or spiritual beliefs that might influence medical decisions  Exploration of goals of care in the event of a sudden injury or illness  Identification and preparation of a healthcare agent  Review and updating or creation of an advance directive document. 3. Palliative care encounter; Palliative care encounter; Palliative medicine team will continue to support patient, patient's family, and medical team. Visit consisted of counseling and education dealing with the complex and emotionally intense issues of symptom management and palliative care in the setting of serious and potentially life-threatening illness  4. Weight loss; anorexia reviewed weights, ongoing poor appetite, overall decline, encourage Mikayla [name] to eat, supplements with now wound. Continue to monitor weights. Will need  further discussions with family with focus on goc, continue wound care 1 year ago 203 lbs 10/27/2022 weight 167.6 lbs 35.4 lbs/12 months; 17.44% Follow up Palliative Care Visit: Palliative care will continue to follow for complex medical decision making, advance care planning, and clarification of goals. Return 4 to 8  weeks or prn. I spent 46 minutes providing this consultation. More than 50% of the time in this consultation was spent in counseling and care coordination. PPS: 30% Chief Complaint: Follow up palliative consult for complex medical decision making, address goals, manage ongoing symptoms  HISTORY OF PRESENT ILLNESS:  Mikayla [name] is a 82 y.o. year old female  with multiple medical problems including Parkinson disease, emphysema, atrial fibrillation, hypertension, obesity. She have a hospitalization in 05/2016 until 07/2016 for emphysema, complicated by ventilator dependence for 15 days, tracheostomy and peg tube. Tracheostomy placement was 8 / 10 / 2017 and remove 9 / 29 / 270. Peg tube placement was 8 / 10 / 2017. She did have a right thoracoscopy with the quotation for empyema placement 8 / 14 / 2017 she was then transition to long-term care at [facility] where she currently remains. Mikayla [name] is total care, turning, positioning, bathing, dressing. Mikayla [name] requires assistance with feeding, declined appetite with current weight 167.6 lbs, stage III wound right buttock. Staff endorses overall decline. At present Mikayla [name] is lying in bed, appears severely debilitated, sleepy. Mikayla [name] does make eye contact, supplemental O2. Mikayla [name] denies pain, able to answer  some basic ros questions though limited with cognitive impairment. Mikayla [name] was cooperative with assessment. Support provided. Medical goals, poc, medications reviewed. Attempted to contact daughter, will continue to monitor, no new changes today, focus on weights, wounds, declined appetite. Updated staff.   History  obtained from review of EMR, discussion with primary team, and interview with family, facility staff/caregiver and/or Mikayla. [name].  I reviewed available labs, medications, imaging, studies and related documents from the EMR.  Records reviewed and summarized above.   ROS 10 point system reviewed all negative except HPI discussed with staff with Mikayla [name] cognitive impairment  Physical Exam: Constitutional: NAD General: frail appearing, obese, chronically ill, debilitated female ENMT: oral mucous membranes moist CV: S1S2, RRR, +generalized edema Pulmonary: decrease breathsounds throughout, O2 supplemental Abdomen: obese, normo-active BS + 4 quadrants, soft and non tender MSK: bed-bound Skin: warm and dry Neuro:  + generalized weakness,  + cognitive impairment Psych: oriented to self, sleepy Thank you for the opportunity to participate in the care of Mikayla. [name]. Please call our office at [phone] if we can be of additional assistance.   Hanz Winterhalter Ihor Gully, NP

## 2022-12-23 ENCOUNTER — Encounter: Payer: Self-pay | Admitting: Nurse Practitioner

## 2022-12-23 ENCOUNTER — Non-Acute Institutional Stay: Payer: Medicare PPO | Admitting: Nurse Practitioner

## 2022-12-23 DIAGNOSIS — G20A1 Parkinson's disease without dyskinesia, without mention of fluctuations: Secondary | ICD-10-CM

## 2022-12-23 DIAGNOSIS — R634 Abnormal weight loss: Secondary | ICD-10-CM

## 2022-12-23 DIAGNOSIS — Z515 Encounter for palliative care: Secondary | ICD-10-CM

## 2022-12-23 DIAGNOSIS — R630 Anorexia: Secondary | ICD-10-CM

## 2022-12-23 NOTE — Progress Notes (Addendum)
[facility] Pall[facility] Consult Note Telephone: [phone]  Fax: [phone]    Date of encounter: 12/23/22 3:13 PM PATIENT NAME: Mikayla [name] [id number]   [phone] (home)  DOB: 05/20/1941 MRN: [id number] PRIMARY CARE PROVIDER:    [facility]  RESPONSIBLE PARTY:    Contact Information     Name Relation Home Work Mobile   Norton Daughter [phone]     Atkinson,[name] Daughter [phone]       I met face to face with patient in facility. Palliative Care was asked to follow this patient by consultation request of  Gennie Alma, MD/[place]  to address advance care planning and complex medical decision making. This is a follow up visit.                                 ASSESSMENT AND PLAN / RECOMMENDATIONS:  Symptom Management/Plan: 1. Advance Care Planning;  DNR 2. Goals of Care: Goals include to maximize quality of life and syPalliative care encounter; Palliative care encounter; Palliative medicine team will continue to support patient, patient's family, and medical team. Visit consisted of counseling and education dealing with the complex and emotionally intense issues of symptom management and palliative care in the setting of serious and potentially life-threatening illness   3. Weight loss; anorexia reviewed weights, ongoing poor appetite, overall decline, encourage Mikayla Kotarski to eat, supplements with now wound. Continue to monitor weights. Will need further discussions with family with focus on goc, continue wound care 1 year ago 203 lbs 10/27/2022 weight 167.6 lbs 11/26/2022 weight 165.6 lbs   Follow up Palliative Care Visit: PC f/u visit further discussion monitor trends of appetite, weights, monitor for functional, cognitive decline with chronic disease progression, assess any active symptoms, supportive role. Palliative care will continue to follow for complex medical  decision making, advance care planning, and clarification of goals. Return 4 to 8  weeks or prn. I spent 45 minutes providing this consultation. More than 50% of the time in this consultation was spent in counseling and care coordination. PPS: 30% Chief Complaint: Follow up palliative consult for complex medical decision making, address goals, manage ongoing symptoms   HISTORY OF PRESENT ILLNESS:  Mikayla [name] is a 82 y.o. year old female  with multiple medical problems including Parkinson disease, emphysema, atrial fibrillation, hypertension, obesity. She have a hospitalization in 05/2016 until 07/2016 for emphysema, complicated by ventilator dependence for 15 days, tracheostomy and peg tube. Tracheostomy placement was 8 / 10 / 2017 and remove 9 / 29 / 270. Peg tube placement was 8 / 10 / 2017. She did have a right thoracoscopy with the quotation for empyema placement 8 / 14 / 2017 she was then transition to long-term care at [facility] where she currently remains. Mikayla [name] is total care, turning, positioning, bathing, dressing. Mikayla [name] requires assistance with feeding, declined appetite ongoing weight loss. At present Mikayla [name] is lying in bed, appears severely debilitated, sleepy. Mikayla [name] does make eye contact, supplemental O2, engaging today, answers questions limited with cognitive impairment. Mikayla [name] was cooperative with assessment. Mikayla [name] talked about her daughters. ROS, talked about functional ability. Mikayla [name] was able to reach up on the bedside table and pick up a bottle of coke, put it to her mouth, take a sip and put it back. Mikayla [name] endorse her  mouth was dry. Mikayla [name] was engaging with this visit. Most of visit support provided. Medical goals, poc, medications reviewed. Attempted to contact daughter Hughes Better, Purpose of today PC f/u visit further discussion monitor trends of appetite, weights, monitor for functional, cognitive decline with chronic disease  progression, assess any active symptoms, supportive role. Updated staff.   12/25/2022 I contacted Suzanna dtg of Mikayla [name], clinical update discussed, talked about pc visit, reviewed medical goals, poc, functional debility, appetite, oral hydration and reviewed what family is doing visiting, bring daily meals to improve quality of care. Support provided.    History obtained from review of EMR, discussion with primary team, and interview with family, facility staff/caregiver and/or Mikayla. Cozza.  I reviewed available labs, medications, imaging, studies and related documents from the EMR.  Records reviewed and summarized above.    Physical Exam: General: frail appearing, obese, chronically ill, debilitated female ENMT: oral mucous membranes moist CV: S1S2, RRR, +generalized edema Pulmonary: decrease breathsounds throughout, O2 supplemental Skin: warm and dry Neuro:  + generalized weakness,  + cognitive impairment Psych: oriented to self Thank you for the opportunity to participate in the care of Mikayla. [name]. Please call our office at [phone] if we can be of additional assistance.   Leanard Dimaio Ihor Gully, NP

## 2023-02-09 ENCOUNTER — Emergency Department: Payer: Medicare PPO

## 2023-02-09 ENCOUNTER — Inpatient Hospital Stay
Admission: EM | Admit: 2023-02-09 | Discharge: 2023-02-15 | DRG: 371 | Disposition: A | Discharge status: Skilled Nursing Facility | Payer: Medicare PPO | Attending: Osteopathic Medicine | Admitting: Osteopathic Medicine

## 2023-02-09 ENCOUNTER — Other Ambulatory Visit: Payer: Self-pay

## 2023-02-09 ENCOUNTER — Encounter: Payer: Self-pay | Admitting: Intensive Care

## 2023-02-09 DIAGNOSIS — I10 Essential (primary) hypertension: Secondary | ICD-10-CM | POA: Diagnosis present

## 2023-02-09 DIAGNOSIS — Z885 Allergy status to narcotic agent status: Secondary | ICD-10-CM

## 2023-02-09 DIAGNOSIS — J449 Chronic obstructive pulmonary disease, unspecified: Secondary | ICD-10-CM | POA: Insufficient documentation

## 2023-02-09 DIAGNOSIS — B379 Candidiasis, unspecified: Secondary | ICD-10-CM

## 2023-02-09 DIAGNOSIS — G20A1 Parkinson's disease without dyskinesia, without mention of fluctuations: Secondary | ICD-10-CM | POA: Diagnosis present

## 2023-02-09 DIAGNOSIS — I69365 Other paralytic syndrome following cerebral infarction, bilateral: Secondary | ICD-10-CM

## 2023-02-09 DIAGNOSIS — E876 Hypokalemia: Secondary | ICD-10-CM

## 2023-02-09 DIAGNOSIS — A419 Sepsis, unspecified organism: Secondary | ICD-10-CM

## 2023-02-09 DIAGNOSIS — L89159 Pressure ulcer of sacral region, unspecified stage: Secondary | ICD-10-CM | POA: Insufficient documentation

## 2023-02-09 DIAGNOSIS — I509 Heart failure, unspecified: Secondary | ICD-10-CM | POA: Diagnosis present

## 2023-02-09 DIAGNOSIS — N1831 Chronic kidney disease, stage 3a: Secondary | ICD-10-CM | POA: Insufficient documentation

## 2023-02-09 DIAGNOSIS — R8271 Bacteriuria: Secondary | ICD-10-CM | POA: Insufficient documentation

## 2023-02-09 DIAGNOSIS — N390 Urinary tract infection, site not specified: Secondary | ICD-10-CM

## 2023-02-09 DIAGNOSIS — Z800 Family history of malignant neoplasm of digestive organs: Secondary | ICD-10-CM

## 2023-02-09 DIAGNOSIS — Z79899 Other long term (current) drug therapy: Secondary | ICD-10-CM

## 2023-02-09 DIAGNOSIS — Z2239 Carrier of other specified bacterial diseases: Secondary | ICD-10-CM

## 2023-02-09 DIAGNOSIS — E860 Dehydration: Secondary | ICD-10-CM | POA: Diagnosis present

## 2023-02-09 DIAGNOSIS — Z881 Allergy status to other antibiotic agents status: Secondary | ICD-10-CM

## 2023-02-09 DIAGNOSIS — J9611 Chronic respiratory failure with hypoxia: Secondary | ICD-10-CM | POA: Insufficient documentation

## 2023-02-09 DIAGNOSIS — A0472 Enterocolitis due to Clostridium difficile, not specified as recurrent: Principal | ICD-10-CM | POA: Diagnosis present

## 2023-02-09 DIAGNOSIS — Z66 Do not resuscitate: Secondary | ICD-10-CM | POA: Diagnosis present

## 2023-02-09 DIAGNOSIS — K219 Gastro-esophageal reflux disease without esophagitis: Secondary | ICD-10-CM | POA: Diagnosis present

## 2023-02-09 DIAGNOSIS — Z801 Family history of malignant neoplasm of trachea, bronchus and lung: Secondary | ICD-10-CM

## 2023-02-09 DIAGNOSIS — Z825 Family history of asthma and other chronic lower respiratory diseases: Secondary | ICD-10-CM

## 2023-02-09 DIAGNOSIS — I480 Paroxysmal atrial fibrillation: Secondary | ICD-10-CM | POA: Diagnosis present

## 2023-02-09 DIAGNOSIS — B372 Candidiasis of skin and nail: Secondary | ICD-10-CM | POA: Insufficient documentation

## 2023-02-09 DIAGNOSIS — Z8249 Family history of ischemic heart disease and other diseases of the circulatory system: Secondary | ICD-10-CM

## 2023-02-09 DIAGNOSIS — Z882 Allergy status to sulfonamides status: Secondary | ICD-10-CM

## 2023-02-09 DIAGNOSIS — R532 Functional quadriplegia: Secondary | ICD-10-CM | POA: Insufficient documentation

## 2023-02-09 DIAGNOSIS — Z87891 Personal history of nicotine dependence: Secondary | ICD-10-CM

## 2023-02-09 DIAGNOSIS — R531 Weakness: Secondary | ICD-10-CM

## 2023-02-09 DIAGNOSIS — B3731 Acute candidiasis of vulva and vagina: Secondary | ICD-10-CM | POA: Diagnosis present

## 2023-02-09 DIAGNOSIS — Z9981 Dependence on supplemental oxygen: Secondary | ICD-10-CM

## 2023-02-09 DIAGNOSIS — L89152 Pressure ulcer of sacral region, stage 2: Secondary | ICD-10-CM | POA: Diagnosis present

## 2023-02-09 DIAGNOSIS — Z7401 Bed confinement status: Secondary | ICD-10-CM

## 2023-02-09 DIAGNOSIS — Z7951 Long term (current) use of inhaled steroids: Secondary | ICD-10-CM

## 2023-02-09 DIAGNOSIS — D72829 Elevated white blood cell count, unspecified: Secondary | ICD-10-CM

## 2023-02-09 DIAGNOSIS — L22 Diaper dermatitis: Secondary | ICD-10-CM | POA: Diagnosis present

## 2023-02-09 DIAGNOSIS — I130 Hypertensive heart and chronic kidney disease with heart failure and stage 1 through stage 4 chronic kidney disease, or unspecified chronic kidney disease: Secondary | ICD-10-CM | POA: Diagnosis present

## 2023-02-09 DIAGNOSIS — R32 Unspecified urinary incontinence: Secondary | ICD-10-CM | POA: Diagnosis present

## 2023-02-09 LAB — COMPREHENSIVE METABOLIC PANEL
ALT: 10 U/L (ref 0–44)
AST: 24 U/L (ref 15–41)
Albumin: 2 g/dL — ABNORMAL LOW (ref 3.5–5.0)
Alkaline Phosphatase: 106 U/L (ref 38–126)
Anion gap: 13 (ref 5–15)
BUN: 14 mg/dL (ref 8–23)
CO2: 33 mmol/L — ABNORMAL HIGH (ref 22–32)
Calcium: 7.5 mg/dL — ABNORMAL LOW (ref 8.9–10.3)
Chloride: 91 mmol/L — ABNORMAL LOW (ref 98–111)
Creatinine, Ser: 1.17 mg/dL — ABNORMAL HIGH (ref 0.44–1.00)
GFR, Estimated: 47 mL/min — ABNORMAL LOW (ref >60)
Glucose, Bld: 125 mg/dL — ABNORMAL HIGH (ref 70–99)
Potassium: 2.5 mmol/L — CL (ref 3.5–5.1)
Sodium: 137 mmol/L (ref 135–145)
Total Bilirubin: 0.7 mg/dL (ref 0.3–1.2)
Total Protein: 5.5 g/dL — ABNORMAL LOW (ref 6.5–8.1)

## 2023-02-09 LAB — CLOSTRIDIUM DIFFICILE BY PCR, REFLEXED: Toxigenic C. Difficile by PCR: POSITIVE — AB

## 2023-02-09 LAB — CBC WITH DIFFERENTIAL/PLATELET
Abs Immature Granulocytes: 0.12 10*3/uL — ABNORMAL HIGH (ref 0.00–0.07)
Basophils Absolute: 0.1 10*3/uL (ref 0.0–0.1)
Basophils Relative: 1 %
Eosinophils Absolute: 0.2 10*3/uL (ref 0.0–0.5)
Eosinophils Relative: 1 %
HCT: 34.2 % — ABNORMAL LOW (ref 36.0–46.0)
Hemoglobin: 10.4 g/dL — ABNORMAL LOW (ref 12.0–15.0)
Immature Granulocytes: 1 %
Lymphocytes Relative: 11 %
Lymphs Abs: 1.8 10*3/uL (ref 0.7–4.0)
MCH: 27.8 pg (ref 26.0–34.0)
MCHC: 30.4 g/dL (ref 30.0–36.0)
MCV: 91.4 fL (ref 80.0–100.0)
Monocytes Absolute: 0.7 10*3/uL (ref 0.1–1.0)
Monocytes Relative: 5 %
Neutro Abs: 13 10*3/uL — ABNORMAL HIGH (ref 1.7–7.7)
Neutrophils Relative %: 81 %
Platelets: 250 10*3/uL (ref 150–400)
RBC: 3.74 MIL/uL — ABNORMAL LOW (ref 3.87–5.11)
RDW: 16.6 % — ABNORMAL HIGH (ref 11.5–15.5)
Smear Review: NORMAL
WBC: 15.9 10*3/uL — ABNORMAL HIGH (ref 4.0–10.5)
nRBC: 0 % (ref 0.0–0.2)

## 2023-02-09 LAB — C DIFFICILE QUICK SCREEN W PCR REFLEX
C Diff antigen: POSITIVE — AB
C Diff toxin: NEGATIVE

## 2023-02-09 MED ORDER — POTASSIUM CHLORIDE 10 MEQ/100ML IV SOLN
10.0000 meq | Freq: Once | INTRAVENOUS | Status: AC
  Administered 2023-02-10: 10 meq via INTRAVENOUS
  Filled 2023-02-09: qty 100

## 2023-02-09 MED ORDER — VANCOMYCIN HCL 125 MG PO CAPS
125.0000 mg | ORAL_CAPSULE | Freq: Four times a day (QID) | ORAL | Status: DC
  Administered 2023-02-10 – 2023-02-15 (×21): 125 mg via ORAL
  Filled 2023-02-09 (×23): qty 1

## 2023-02-09 MED ORDER — POTASSIUM CHLORIDE 10 MEQ/100ML IV SOLN
10.0000 meq | Freq: Once | INTRAVENOUS | Status: DC
  Filled 2023-02-09: qty 100

## 2023-02-09 MED ORDER — SODIUM CHLORIDE 0.9 % IV BOLUS
1000.0000 mL | Freq: Once | INTRAVENOUS | Status: AC
  Administered 2023-02-10: 1000 mL via INTRAVENOUS

## 2023-02-09 NOTE — ED Triage Notes (Addendum)
Patient arrived by EMS from [facility]. Recently diagnosed and treated for UTI twice. Patients WBC came back elevated after blood draw and staff wanted patient sent here for blood cultures. WBC 13 reported by daughter from recently drawn blood Friday at [facility]  History stroke with left side deficits. Patient is hard of hearing. Bedbound from stroke.   Patient wears 3L O2 continuously per daughter  History half of right lung removed. Parkinsons, COPD

## 2023-02-09 NOTE — ED Notes (Signed)
Currently has right arm PICC line

## 2023-02-09 NOTE — ED Triage Notes (Addendum)
First Nurse Note: Pt to ED via [facility] from [facility]. Pt had lab work done and WBC was elevated from 10 to 13. Pt has right arm midline cath in place from IV antibiotics but has already finished 7 day course.

## 2023-02-09 NOTE — ED Provider Notes (Addendum)
[facility] Provider Note    Event Date/Time   First MD Initiated Contact with Patient 02/09/23 2333     (approximate)   History   Abnormal labs   HPI  Mikayla [name] is a 82 y.o. female brought to the ED via EMS from SNF with a chief complaint of abnormal labs.  Patient was recently septic twice secondary to UTI.  Currently receiving Invanz via right upper arm PICC line.  Staff sent patient for leukocytosis seen on routine blood draw.  Patient wears oxygen at baseline secondary to CHF.  She has been bedbound for the past 7 years secondary to stroke with left-sided deficits.  Daughter mentions patient has been having loose stools.  Patient denies chest pain, worsening shortness of breath, abdominal pain, nausea, vomiting or dizziness.     Past Medical History   Past Medical History:  Diagnosis Date   Anemia    Atrial fibrillation    CHF (congestive heart failure)    Empyema    in [facility] Aug 2017- stayed in hospital on for 2 months.   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 difficile diarrhea 02/10/2023   Sacral decubitus ulcer 02/10/2023   COPD (chronic obstructive pulmonary disease) 02/10/2023   Chronic respiratory failure with hypoxia 02/10/2023   Hypokalemia 02/10/2023   Stage 3a chronic kidney disease 02/10/2023   Urinary tract infection 02/10/2023   Leukocytosis 02/10/2023   Candidal intertrigo 02/10/2023   Parkinson's disease 01/17/2021   Occasional tremors 01/17/2021   General body deterioration 12/02/2018   Shortness of breath 12/02/2018   HTN (hypertension) 11/02/2018   PAF (paroxysmal atrial fibrillation) 11/02/2018   Renal hemorrhage, right 11/02/2018   Acute cholangitis    Calculus of bile duct with acute  cholangitis with obstruction    Severe sepsis 08/19/2018   Acute respiratory failure 08/10/2018   Calculus of bile duct without cholangitis or cholecystitis with obstruction    Cellulitis 11/20/2016   Pressure injury of skin 09/30/2016   Palliative care encounter    Goals of care, counseling/discussion    Encounter for hospice care discussion    Contracture of muscle of hand 07/06/2016     Past Surgical History   Past Surgical History:  Procedure Laterality Date   EMBOLIZATION N/A 11/03/2018   Procedure: EMBOLIZATION;  Surgeon: Annice Needy, MD;  Location: [facility] INVASIVE CV LAB;  Service: Cardiovascular;  Laterality: N/A;   ERCP N/A 08/23/2018   Procedure: ENDOSCOPIC RETROGRADE CHOLANGIOPANCREATOGRAPHY (ERCP);  Surgeon: Midge Minium, MD;  Location: [facility] ENDOSCOPY;  Service: Endoscopy;  Laterality: N/A;   PEG TUBE PLACEMENT       Home Medications   Prior to Admission medications   Medication Sig Start Date End Date Taking? Authorizing Provider  acetaminophen (TYLENOL) 325 MG tablet Take 650 mg by mouth 3 (three) times daily.    [provider]  amiodarone (PACERONE) 200 MG tablet Take 1 tablet (200 mg total) by mouth daily. 11/22/16   Shaune Pollack, MD  ammonium lactate (AMLACTIN) 12 % cream Apply 1 application topically as needed for dry skin.    [provider]  carvedilol (COREG) 3.125 MG tablet Take 1 tablet (3.125 mg total) by mouth 2 (two) times daily  with a meal. 08/12/18   Altamese Dilling, MD  ferrous sulfate 324 (65 Fe) MG TBEC Take 1 tablet by mouth daily.    [provider]  furosemide (LASIX) 20 MG tablet Take 1 tablet (20 mg total) by mouth daily. 10/30/16   Milagros Loll, MD  gabapentin (NEURONTIN) 100 MG capsule Take 100 mg by mouth at bedtime.    [provider]  guaiFENesin (MUCINEX) 600 MG 12 hr tablet Take 600 mg by mouth 2 (two) times daily as needed for cough.    [provider]  Ipratropium-Albuterol (COMBIVENT  RESPIMAT) 20-100 MCG/ACT AERS respimat Inhale 1 puff into the lungs 3 (three) times daily.    [provider]  ipratropium-albuterol (DUONEB) 0.5-2.5 (3) MG/3ML SOLN Take 3 mLs by nebulization every 6 (six) hours as needed.    [provider]  Multiple Vitamins-Minerals (CENTROVITE) TABS Take 1 tablet by mouth daily.    [provider]  nitrofurantoin, macrocrystal-monohydrate, (MACROBID) 100 MG capsule Take 100 mg by mouth 2 (two) times daily. 03/19/22   [provider]  nystatin (MYCOSTATIN/NYSTOP) powder Apply topically 2 (two) times daily as needed. APPLY UNDER NECK TWICE DAILY AS NEEDED FOR YEAST Patient not taking: Reported on 04/18/2021    [provider]  omeprazole (PRILOSEC OTC) 20 MG tablet Take 20 mg by mouth 2 (two) times daily.    [provider]  OXYGEN Inhale 2 L/min into the lungs continuous.    [provider]  PARoxetine (PAXIL) 30 MG tablet Take 30 mg by mouth daily.     [provider]  senna (SENOKOT) 8.6 MG TABS tablet Take 2 tablets by mouth at bedtime.     [provider]  zinc oxide 20 % ointment Apply 1 application topically every 8 (eight) hours. To groin and thighs each shift    [provider]     Allergies  Levaquin [levofloxacin], Other, Sulfa antibiotics, and Oxycodone   Family History   Family History  Problem Relation Age of Onset   Hypertension Mother    COPD Mother    Lung cancer Father    Liver cancer Sister      Physical Exam  Triage Vital Signs: ED Triage Vitals [02/09/23 1835]  Enc Vitals Group     BP (!) 167/95     Pulse Rate 87     Resp 20     Temp 98.4 F (36.9 C)     Temp Source Oral     SpO2 95 %     Weight 145 lb (65.8 kg)     Height 5\' 3"  (1.6 m)     Head Circumference      Peak Flow      Pain Score 0     Pain Loc      Pain Edu?      Excl. in GC?     Updated Vital Signs: BP 95/65   Pulse 79   Temp 98.7 F (37.1 C) (Oral)   Resp  15   Ht 5\' 3"  (1.6 m)   Wt 65.8 kg   SpO2 100%   BMI 25.69 kg/m    General: Awake, mild distress.  Ill-appearing. CV:  RRR.  Good peripheral perfusion.  Resp:  Increased effort.  Diminished, otherwise CTAB. Abd:  Nontender to light or deep palpation.  No truncal vesicles.  No distention.  Other:  Sacral decubitus ulcer.  Candidal rash to groin and perineum.   ED Results / Procedures / Treatments  Labs (all labs ordered are listed, but only abnormal results are displayed) Labs Reviewed  C DIFFICILE QUICK SCREEN W PCR REFLEX   - Abnormal; Notable for the following components:      Result Value   C Diff antigen POSITIVE (*)    All other components within normal limits  CLOSTRIDIUM DIFFICILE BY PCR, REFLEXED - Abnormal; Notable for the following components:   Toxigenic C. Difficile by PCR POSITIVE (*)    All other components within normal limits  CBC WITH DIFFERENTIAL/PLATELET - Abnormal; Notable for the following components:   WBC 15.9 (*)    RBC 3.74 (*)    Hemoglobin 10.4 (*)    HCT 34.2 (*)    RDW 16.6 (*)    Neutro Abs 13.0 (*)    Abs Immature Granulocytes 0.12 (*)    All other components within normal limits  COMPREHENSIVE METABOLIC PANEL - Abnormal; Notable for the following components:   Potassium 2.5 (*)    Chloride 91 (*)    CO2 33 (*)    Glucose, Bld 125 (*)    Creatinine, Ser 1.17 (*)    Calcium 7.5 (*)    Total Protein 5.5 (*)    Albumin 2.0 (*)    GFR, Estimated 47 (*)    All other components within normal limits  URINALYSIS, ROUTINE W REFLEX MICROSCOPIC - Abnormal; Notable for the following components:   Color, Urine YELLOW (*)    APPearance CLOUDY (*)    Hgb urine dipstick SMALL (*)    Ketones, ur 5 (*)    Leukocytes,Ua LARGE (*)    All other components within normal limits  BASIC METABOLIC PANEL - Abnormal; Notable for the following components:   Potassium 3.0 (*)    Chloride 95 (*)    Creatinine, Ser 1.08 (*)    Calcium 7.2 (*)    GFR, Estimated  51 (*)    All other components within normal limits  CBC - Abnormal; Notable for the following components:   WBC 12.5 (*)    RBC 2.91 (*)    Hemoglobin 8.1 (*)    HCT 26.5 (*)    RDW 16.7 (*)    All other components within normal limits  CULTURE, BLOOD (SINGLE)  CULTURE, BLOOD (SINGLE)  URINE CULTURE  CULTURE, BLOOD (ROUTINE X 2)  CULTURE, BLOOD (ROUTINE X 2)  LACTIC ACID, PLASMA  PROCALCITONIN     EKG  ED ECG REPORT I, Biviana Saddler J, the attending physician, personally viewed and interpreted this ECG.   Date: 02/10/2023  EKG Time: 2330  Rate: 90  Rhythm: normal sinus rhythm  Axis: Normal  Intervals: QTc 524  ST&T Change: Nonspecific    RADIOLOGY I have independently visualized and interpreted patient's chest x-ray as well as noted the radiology interpretation:  X-ray: Low lung volumes, no acute cardiopulmonary process  Official radiology report(s): DG Chest Port 1 View  Result Date: 02/10/2023 CLINICAL DATA:  Weakness EXAM: PORTABLE CHEST 1 VIEW COMPARISON:  Chest radiograph 11/02/2018 FINDINGS: Stable cardiomediastinal silhouette. Low lung volumes accentuate pulmonary vascularity. Chronic bilateral bronchitic changes. No focal consolidation, pleural effusion, or pneumothorax. Left basilar calcified granuloma. Remote right rib fractures. Advanced arthritis left shoulder. IMPRESSION: Low lung volumes with chronic bronchitic changes. Electronically Signed   By: Minerva Fester M.D.   On: 02/10/2023 01:03     PROCEDURES:  Critical Care performed: Yes, see critical care procedure note(s)  CRITICAL CARE Performed by: Irean Hong   Total critical care time: 45 minutes  Critical  care time was exclusive of separately billable procedures and treating other patients.  Critical care was necessary to treat or prevent imminent or life-threatening deterioration.  Critical care was time spent personally by me on the following activities: development of treatment plan with  patient and/or surrogate as well as nursing, discussions with consultants, evaluation of patient's response to treatment, examination of patient, obtaining history from patient or surrogate, ordering and performing treatments and interventions, ordering and review of laboratory studies, ordering and review of radiographic studies, pulse oximetry and re-evaluation of patient's condition.   Marland Kitchen1-3 Lead EKG Interpretation  Performed by: Irean Hong, MD Authorized by: Irean Hong, MD     Interpretation: normal     ECG rate:  90   ECG rate assessment: normal     Rhythm: sinus rhythm     Ectopy: none     Conduction: normal   Comments:     Patient placed on cardiac monitor to evaluate for arrhythmias    MEDICATIONS ORDERED IN ED: Medications  vancomycin (VANCOCIN) capsule 125 mg (125 mg Oral Given 02/10/23 0119)  nystatin ointment (MYCOSTATIN) (0 Applications Topical Hold 02/10/23 0124)  carvedilol (COREG) tablet 3.125 mg (has no administration in time range)  PARoxetine (PAXIL) tablet 30 mg (has no administration in time range)  pantoprazole (PROTONIX) EC tablet 40 mg (has no administration in time range)  ipratropium-albuterol (DUONEB) 0.5-2.5 (3) MG/3ML nebulizer solution 3 mL (has no administration in time range)  zinc oxide 20 % ointment 1 Application (has no administration in time range)  enoxaparin (LOVENOX) injection 40 mg (has no administration in time range)  acetaminophen (TYLENOL) tablet 650 mg (has no administration in time range)    Or  acetaminophen (TYLENOL) suppository 650 mg (has no administration in time range)  ondansetron (ZOFRAN) tablet 4 mg (has no administration in time range)    Or  ondansetron (ZOFRAN) injection 4 mg (has no administration in time range)  HYDROcodone-acetaminophen (NORCO/VICODIN) 5-325 MG per tablet 1-2 tablet (has no administration in time range)  morphine (PF) 2 MG/ML injection 2 mg (has no administration in time range)  sodium chloride 0.9 %  bolus 1,000 mL (0 mLs Intravenous Stopped 02/10/23 0546)  potassium chloride 10 mEq in 100 mL IVPB (0 mEq Intravenous Stopped 02/10/23 0249)  potassium chloride 10 mEq in 100 mL IVPB (0 mEq Intravenous Stopped 02/10/23 0121)  fentaNYL (SUBLIMAZE) injection 50 mcg (50 mcg Intravenous Given 02/10/23 0019)  potassium chloride 10 mEq in 100 mL IVPB (0 mEq Intravenous Stopped 02/10/23 0520)     IMPRESSION / MDM / ASSESSMENT AND PLAN / ED COURSE  I reviewed the triage vital signs and the nursing notes.                             82 year old female sent from SNF for abnormal labs.  Currently receiving IV antibiotic via PICC line with complaints of generalized weakness and diarrhea.  Differential diagnosis includes but is not limited to sepsis, ACS, infectious, metabolic etiologies, etc.  I personally viewed patient's records and note palliative nursing care notes, last from 12/23/2022.  Patient's presentation is most consistent with acute presentation with potential threat to life or bodily function.  The patient is on the cardiac monitor to evaluate for evidence of arrhythmia and/or significant heart rate changes.  Laboratory results demonstrate leukocytosis WBC 15.9, hypokalemia with potassium 2.5, AKI creatinine 1.17.  She is C. difficile positive.  Will administer IV  fluids, nystatin ointment for candidiasis, vancomycin capsule per C. difficile order set, IV potassium runs.  Will consult hospital services for evaluation and admission.      FINAL CLINICAL IMPRESSION(S) / ED DIAGNOSES   Final diagnoses:  C. difficile colitis  Hypokalemia  Weakness generalized  Dehydration  Pressure injury of skin of sacral region, unspecified injury stage  Candida infection  Sepsis, due to unspecified organism, unspecified whether acute organ dysfunction present  Urinary tract infection without hematuria, site unspecified     Rx / DC Orders   ED Discharge Orders     None        Note:  This  document was prepared using Dragon voice recognition software and may include unintentional dictation errors.   Irean Hong, MD 02/10/23 [id number]    Irean Hong, MD 02/10/23 [phone]

## 2023-02-10 ENCOUNTER — Emergency Department: Payer: Medicare PPO

## 2023-02-10 DIAGNOSIS — N390 Urinary tract infection, site not specified: Secondary | ICD-10-CM | POA: Diagnosis present

## 2023-02-10 DIAGNOSIS — J42 Unspecified chronic bronchitis: Secondary | ICD-10-CM | POA: Diagnosis not present

## 2023-02-10 DIAGNOSIS — K219 Gastro-esophageal reflux disease without esophagitis: Secondary | ICD-10-CM | POA: Diagnosis present

## 2023-02-10 DIAGNOSIS — J449 Chronic obstructive pulmonary disease, unspecified: Secondary | ICD-10-CM | POA: Diagnosis present

## 2023-02-10 DIAGNOSIS — N1831 Chronic kidney disease, stage 3a: Secondary | ICD-10-CM | POA: Insufficient documentation

## 2023-02-10 DIAGNOSIS — Z8249 Family history of ischemic heart disease and other diseases of the circulatory system: Secondary | ICD-10-CM | POA: Diagnosis not present

## 2023-02-10 DIAGNOSIS — Z9981 Dependence on supplemental oxygen: Secondary | ICD-10-CM | POA: Diagnosis not present

## 2023-02-10 DIAGNOSIS — L89159 Pressure ulcer of sacral region, unspecified stage: Secondary | ICD-10-CM | POA: Insufficient documentation

## 2023-02-10 DIAGNOSIS — R532 Functional quadriplegia: Secondary | ICD-10-CM | POA: Diagnosis present

## 2023-02-10 DIAGNOSIS — L89152 Pressure ulcer of sacral region, stage 2: Secondary | ICD-10-CM | POA: Diagnosis present

## 2023-02-10 DIAGNOSIS — Z79899 Other long term (current) drug therapy: Secondary | ICD-10-CM | POA: Diagnosis not present

## 2023-02-10 DIAGNOSIS — A0472 Enterocolitis due to Clostridium difficile, not specified as recurrent: Principal | ICD-10-CM

## 2023-02-10 DIAGNOSIS — J9611 Chronic respiratory failure with hypoxia: Secondary | ICD-10-CM | POA: Insufficient documentation

## 2023-02-10 DIAGNOSIS — E876 Hypokalemia: Secondary | ICD-10-CM

## 2023-02-10 DIAGNOSIS — Z87891 Personal history of nicotine dependence: Secondary | ICD-10-CM | POA: Diagnosis not present

## 2023-02-10 DIAGNOSIS — E860 Dehydration: Secondary | ICD-10-CM | POA: Diagnosis present

## 2023-02-10 DIAGNOSIS — Z7401 Bed confinement status: Secondary | ICD-10-CM | POA: Diagnosis not present

## 2023-02-10 DIAGNOSIS — B372 Candidiasis of skin and nail: Secondary | ICD-10-CM | POA: Diagnosis present

## 2023-02-10 DIAGNOSIS — D72829 Elevated white blood cell count, unspecified: Secondary | ICD-10-CM | POA: Diagnosis not present

## 2023-02-10 DIAGNOSIS — I10 Essential (primary) hypertension: Secondary | ICD-10-CM | POA: Diagnosis not present

## 2023-02-10 DIAGNOSIS — I69365 Other paralytic syndrome following cerebral infarction, bilateral: Secondary | ICD-10-CM | POA: Diagnosis not present

## 2023-02-10 DIAGNOSIS — I480 Paroxysmal atrial fibrillation: Secondary | ICD-10-CM | POA: Diagnosis present

## 2023-02-10 DIAGNOSIS — Z66 Do not resuscitate: Secondary | ICD-10-CM | POA: Diagnosis present

## 2023-02-10 DIAGNOSIS — I509 Heart failure, unspecified: Secondary | ICD-10-CM | POA: Diagnosis present

## 2023-02-10 DIAGNOSIS — G20A1 Parkinson's disease without dyskinesia, without mention of fluctuations: Secondary | ICD-10-CM | POA: Diagnosis present

## 2023-02-10 DIAGNOSIS — I130 Hypertensive heart and chronic kidney disease with heart failure and stage 1 through stage 4 chronic kidney disease, or unspecified chronic kidney disease: Secondary | ICD-10-CM | POA: Diagnosis present

## 2023-02-10 DIAGNOSIS — B3731 Acute candidiasis of vulva and vagina: Secondary | ICD-10-CM | POA: Diagnosis present

## 2023-02-10 LAB — URINALYSIS, ROUTINE W REFLEX MICROSCOPIC
Bacteria, UA: NONE SEEN
Bilirubin Urine: NEGATIVE
Glucose, UA: NEGATIVE mg/dL
Ketones, ur: 5 mg/dL — AB
Nitrite: NEGATIVE
Protein, ur: NEGATIVE mg/dL
Specific Gravity, Urine: 1.012 (ref 1.005–1.030)
WBC, UA: >50 WBC/hpf (ref 0–5)
pH: 5 (ref 5.0–8.0)

## 2023-02-10 LAB — CBC
HCT: 26.5 % — ABNORMAL LOW (ref 36.0–46.0)
Hemoglobin: 8.1 g/dL — ABNORMAL LOW (ref 12.0–15.0)
MCH: 27.8 pg (ref 26.0–34.0)
MCHC: 30.6 g/dL (ref 30.0–36.0)
MCV: 91.1 fL (ref 80.0–100.0)
Platelets: 331 10*3/uL (ref 150–400)
RBC: 2.91 MIL/uL — ABNORMAL LOW (ref 3.87–5.11)
RDW: 16.7 % — ABNORMAL HIGH (ref 11.5–15.5)
WBC: 12.5 10*3/uL — ABNORMAL HIGH (ref 4.0–10.5)
nRBC: 0 % (ref 0.0–0.2)

## 2023-02-10 LAB — LACTIC ACID, PLASMA: Lactic Acid, Venous: 1.2 mmol/L (ref 0.5–1.9)

## 2023-02-10 LAB — BASIC METABOLIC PANEL
Anion gap: 11 (ref 5–15)
BUN: 13 mg/dL (ref 8–23)
CO2: 32 mmol/L (ref 22–32)
Calcium: 7.2 mg/dL — ABNORMAL LOW (ref 8.9–10.3)
Chloride: 95 mmol/L — ABNORMAL LOW (ref 98–111)
Creatinine, Ser: 1.08 mg/dL — ABNORMAL HIGH (ref 0.44–1.00)
GFR, Estimated: 51 mL/min — ABNORMAL LOW (ref >60)
Glucose, Bld: 81 mg/dL (ref 70–99)
Potassium: 3 mmol/L — ABNORMAL LOW (ref 3.5–5.1)
Sodium: 138 mmol/L (ref 135–145)

## 2023-02-10 LAB — PROCALCITONIN: Procalcitonin: 17.17 ng/mL

## 2023-02-10 MED ORDER — PANTOPRAZOLE SODIUM 40 MG PO TBEC
40.0000 mg | DELAYED_RELEASE_TABLET | Freq: Two times a day (BID) | ORAL | Status: DC
  Administered 2023-02-10 – 2023-02-15 (×11): 40 mg via ORAL
  Filled 2023-02-10 (×11): qty 1

## 2023-02-10 MED ORDER — HYDROCODONE-ACETAMINOPHEN 5-325 MG PO TABS
1.0000 | ORAL_TABLET | ORAL | Status: DC | PRN
  Administered 2023-02-13: 1 via ORAL
  Filled 2023-02-10: qty 1

## 2023-02-10 MED ORDER — FERROUS SULFATE 325 (65 FE) MG PO TABS
325.0000 mg | ORAL_TABLET | Freq: Every day | ORAL | Status: DC
  Administered 2023-02-11 – 2023-02-15 (×5): 325 mg via ORAL
  Filled 2023-02-10 (×5): qty 1

## 2023-02-10 MED ORDER — ACETAMINOPHEN 650 MG RE SUPP: 650.0000 mg | Freq: Four times a day (QID) | RECTAL | Status: DC | PRN

## 2023-02-10 MED ORDER — POTASSIUM CHLORIDE 10 MEQ/100ML IV SOLN
10.0000 meq | INTRAVENOUS | Status: AC
  Administered 2023-02-10 (×2): 10 meq via INTRAVENOUS

## 2023-02-10 MED ORDER — AMIODARONE HCL 200 MG PO TABS
200.0000 mg | ORAL_TABLET | Freq: Every day | ORAL | Status: DC
  Administered 2023-02-10 – 2023-02-15 (×6): 200 mg via ORAL
  Filled 2023-02-10 (×7): qty 1

## 2023-02-10 MED ORDER — MORPHINE SULFATE (PF) 2 MG/ML IV SOLN: 2.0000 mg | INTRAVENOUS | Status: DC | PRN

## 2023-02-10 MED ORDER — FENTANYL CITRATE PF 50 MCG/ML IJ SOSY
50.0000 ug | PREFILLED_SYRINGE | Freq: Once | INTRAMUSCULAR | Status: AC
  Administered 2023-02-10: 50 ug via INTRAVENOUS
  Filled 2023-02-10: qty 1

## 2023-02-10 MED ORDER — SODIUM CHLORIDE 0.9 % IV BOLUS
500.0000 mL | Freq: Once | INTRAVENOUS | Status: AC
  Administered 2023-02-10: 500 mL via INTRAVENOUS

## 2023-02-10 MED ORDER — ONDANSETRON HCL 4 MG PO TABS: 4.0000 mg | ORAL_TABLET | Freq: Four times a day (QID) | ORAL | Status: DC | PRN

## 2023-02-10 MED ORDER — CARBIDOPA-LEVODOPA 25-100 MG PO TABS
1.0000 | ORAL_TABLET | Freq: Three times a day (TID) | ORAL | Status: DC
  Filled 2023-02-10: qty 1

## 2023-02-10 MED ORDER — ADULT MULTIVITAMIN W/MINERALS CH
1.0000 | ORAL_TABLET | Freq: Every day | ORAL | Status: DC
  Administered 2023-02-10 – 2023-02-15 (×5): 1 via ORAL
  Filled 2023-02-10 (×6): qty 1

## 2023-02-10 MED ORDER — CARBIDOPA-LEVODOPA 25-100 MG PO TABS
1.0000 | ORAL_TABLET | Freq: Three times a day (TID) | ORAL | Status: DC
  Administered 2023-02-10 – 2023-02-15 (×13): 1 via ORAL
  Filled 2023-02-10 (×12): qty 1

## 2023-02-10 MED ORDER — NYSTATIN 100000 UNIT/GM EX OINT
TOPICAL_OINTMENT | Freq: Once | CUTANEOUS | Status: DC
  Filled 2023-02-10: qty 15

## 2023-02-10 MED ORDER — IPRATROPIUM-ALBUTEROL 0.5-2.5 (3) MG/3ML IN SOLN: 3.0000 mL | Freq: Three times a day (TID) | RESPIRATORY_TRACT | Status: DC

## 2023-02-10 MED ORDER — CARVEDILOL 6.25 MG PO TABS
3.1250 mg | ORAL_TABLET | Freq: Two times a day (BID) | ORAL | Status: DC
  Administered 2023-02-10 – 2023-02-11 (×3): 3.125 mg via ORAL
  Filled 2023-02-10 (×3): qty 1

## 2023-02-10 MED ORDER — SODIUM CHLORIDE 0.9 % IV SOLN
1.0000 g | INTRAVENOUS | Status: DC
  Administered 2023-02-10: 1 g via INTRAVENOUS
  Filled 2023-02-10: qty 10

## 2023-02-10 MED ORDER — ZINC OXIDE 20 % EX OINT
1.0000 | TOPICAL_OINTMENT | Freq: Three times a day (TID) | CUTANEOUS | Status: DC
  Administered 2023-02-10 – 2023-02-15 (×15): 1 via TOPICAL
  Filled 2023-02-10: qty 1

## 2023-02-10 MED ORDER — VANCOMYCIN HCL 125 MG PO CAPS
125.0000 mg | ORAL_CAPSULE | Freq: Four times a day (QID) | ORAL | Status: DC
Start: 2023-02-10 — End: 2023-02-10

## 2023-02-10 MED ORDER — ACETAMINOPHEN 325 MG PO TABS
650.0000 mg | ORAL_TABLET | Freq: Four times a day (QID) | ORAL | Status: DC | PRN
  Administered 2023-02-10 – 2023-02-14 (×4): 650 mg via ORAL
  Filled 2023-02-10 (×4): qty 2

## 2023-02-10 MED ORDER — GUAIFENESIN ER 600 MG PO TB12: 600.0000 mg | ORAL_TABLET | Freq: Two times a day (BID) | ORAL | Status: DC | PRN

## 2023-02-10 MED ORDER — OLOPATADINE HCL 0.1 % OP SOLN
1.0000 [drp] | Freq: Every day | OPHTHALMIC | Status: DC
  Administered 2023-02-11 – 2023-02-15 (×4): 1 [drp] via OPHTHALMIC
  Filled 2023-02-10: qty 5

## 2023-02-10 MED ORDER — FLUCONAZOLE 50 MG PO TABS
150.0000 mg | ORAL_TABLET | Freq: Once | ORAL | Status: AC
  Administered 2023-02-10: 150 mg via ORAL
  Filled 2023-02-10: qty 1

## 2023-02-10 MED ORDER — FUROSEMIDE 20 MG PO TABS: 20.0000 mg | ORAL_TABLET | Freq: Every day | ORAL | Status: DC

## 2023-02-10 MED ORDER — PAROXETINE HCL 30 MG PO TABS
30.0000 mg | ORAL_TABLET | Freq: Every day | ORAL | Status: DC
  Administered 2023-02-10 – 2023-02-15 (×6): 30 mg via ORAL
  Filled 2023-02-10 (×7): qty 1

## 2023-02-10 MED ORDER — IPRATROPIUM-ALBUTEROL 0.5-2.5 (3) MG/3ML IN SOLN: 3.0000 mL | Freq: Four times a day (QID) | RESPIRATORY_TRACT | Status: DC | PRN

## 2023-02-10 MED ORDER — ONDANSETRON HCL 4 MG/2ML IJ SOLN: 4.0000 mg | Freq: Four times a day (QID) | INTRAMUSCULAR | Status: DC | PRN

## 2023-02-10 MED ORDER — ENOXAPARIN SODIUM 40 MG/0.4ML IJ SOSY
40.0000 mg | PREFILLED_SYRINGE | INTRAMUSCULAR | Status: DC
  Administered 2023-02-10 – 2023-02-15 (×6): 40 mg via SUBCUTANEOUS
  Filled 2023-02-10 (×6): qty 0.4

## 2023-02-10 MED ORDER — IPRATROPIUM-ALBUTEROL 0.5-2.5 (3) MG/3ML IN SOLN
3.0000 mL | Freq: Three times a day (TID) | RESPIRATORY_TRACT | Status: DC
  Administered 2023-02-11 – 2023-02-12 (×4): 3 mL via RESPIRATORY_TRACT
  Filled 2023-02-10 (×4): qty 3

## 2023-02-10 NOTE — Assessment & Plan Note (Signed)
Nystatin cream and barrier precautions.

## 2023-02-10 NOTE — Assessment & Plan Note (Signed)
Patient currently on Invanz via PICC line In the setting of acute C. difficile infection will hold Invanz pending urine analysis/urine culture to evaluate for need for continued antibiotic treatment.  This is patient's second course of IV antibiotics for UTI to report from rehab facility

## 2023-02-10 NOTE — Assessment & Plan Note (Addendum)
Sacral decubitus ulcer Supportive care and decubitus precautions

## 2023-02-10 NOTE — Assessment & Plan Note (Signed)
Continue carvedilol Hold Lasix given hypokalemia

## 2023-02-10 NOTE — Assessment & Plan Note (Signed)
Renal function at baseline 

## 2023-02-10 NOTE — ED Notes (Addendum)
Unable to obtain blood cultures from PICC to R arm.  No blood return noted, despite flushing.

## 2023-02-10 NOTE — Assessment & Plan Note (Addendum)
Oral vancomycin Enteric precautions

## 2023-02-10 NOTE — ED Notes (Signed)
ED TO INPATIENT HANDOFF REPORT  ED Nurse Name and Phone #: Jen Mow [phone]   S Name/Age/Gender Mikayla [name] 82 y.o. female Room/Bed: ED01A/ED01A  Code Status   Code Status: DNR  Home/SNF/Other Nursing Home Patient oriented to: self and place Is this baseline? Yes   Triage Complete: Triage complete  Chief Complaint C. difficile diarrhea [A04.72] C. difficile colitis [A04.72]  Triage Note First Nurse Note: Pt to ED via [facility] from [facility]. Pt had lab work done and WBC was elevated from 10 to 13. Pt has right arm midline cath in place from IV antibiotics but has already finished 7 day course.     [facility]. Recently diagnosed and treated for UTI twice. Patients WBC came back elevated after blood draw and staff wanted patient sent here for blood cultures. WBC 13 reported by daughter from recently drawn blood Friday at [facility]  History stroke with left side deficits. Patient is hard of hearing. Bedbound from stroke.   Patient wears 3L O2 continuously per daughter  History half of right lung removed. Parkinsons, COPD   Allergies Allergies  Allergen Reactions   Levaquin [Levofloxacin]    Other Nausea And Vomiting   Sulfa Antibiotics Hives   Oxycodone Anxiety and Other (See Comments)    Level of Care/Admitting Diagnosis ED Disposition     ED Disposition  Admit   Condition  --   Comment  Hospital Area: [facility] [[id number]]  Level of Care: Med-Surg [16]  Covid Evaluation: Asymptomatic - no recent exposure (last 10 days) testing not required  Diagnosis: C. difficile colitis [147829]  Admitting Physician: Sunnie Nielsen [[id number]]  Attending Physician: Sunnie Nielsen [id number]  Certification:: I certify this patient will need inpatient services for at least 2 midnights          B Medical/Surgery History Past Medical History:  Diagnosis Date   Anemia    Atrial fibrillation     CHF (congestive heart failure)    Empyema    in [facility] Aug 2017- stayed in hospital on for 2 months.   GERD (gastroesophageal reflux disease)    Hypertension    Insomnia    Major depressive disorder    Osteoarthritis    Parkinson disease    Past Surgical History:  Procedure Laterality Date   EMBOLIZATION N/A 11/03/2018   Procedure: EMBOLIZATION;  Surgeon: Annice Needy, MD;  Location: [facility] INVASIVE CV LAB;  Service: Cardiovascular;  Laterality: N/A;   ERCP N/A 08/23/2018   Procedure: ENDOSCOPIC RETROGRADE CHOLANGIOPANCREATOGRAPHY (ERCP);  Surgeon: Midge Minium, MD;  Location: [facility] ENDOSCOPY;  Service: Endoscopy;  Laterality: N/A;   PEG TUBE PLACEMENT       A IV Location/Drains/Wounds Patient Lines/Drains/Airways Status     Active Line/Drains/Airways     Name Placement date Placement time Site Days   Peripheral IV 02/10/23 22 G Lateral;Left;Upper  02/10/23  0017  --  less than 1   Peripheral IV 02/10/23 20 G Anterior;Left Forearm 02/10/23  0018  Forearm  less than 1   Urethral Catheter Rachael, RN 14 Fr. 02/10/23  0121  --  less than 1   Pressure Injury 11/03/18 Stage II -  Partial thickness loss of dermis presenting as a shallow open ulcer with a red, pink wound bed without slough. 11/03/18  1411  -- 1560            Intake/Output Last 24 hours  Intake/Output Summary (Last 24 hours) at 02/10/2023  1456 Last data filed at 02/10/2023 0546 Gross per 24 hour  Intake 1330.95 ml  Output --  Net 1330.95 ml    Labs/Imaging Results for orders placed or performed during the hospital encounter of 02/09/23 (from the past 48 hour(s))  CBC with Differential     Status: Abnormal   Collection Time: 02/09/23  6:39 PM  Result Value Ref Range   WBC 15.9 (H) 4.0 - 10.5 K/uL   RBC 3.74 (L) 3.87 - 5.11 MIL/uL   Hemoglobin 10.4 (L) 12.0 - 15.0 g/dL   HCT 40.9 (L) 81.1 - 91.4 %   MCV 91.4 80.0 - 100.0 fL   MCH 27.8 26.0 - 34.0 pg   MCHC 30.4 30.0 - 36.0 g/dL   RDW 78.2 (H) 95.6 - 21.3 %    Platelets 250 150 - 400 K/uL    Comment: PLATELET COUNT CONFIRMED BY SMEAR   nRBC 0.0 0.0 - 0.2 %   Neutrophils Relative % 81 %   Neutro Abs 13.0 (H) 1.7 - 7.7 K/uL   Lymphocytes Relative 11 %   Lymphs Abs 1.8 0.7 - 4.0 K/uL   Monocytes Relative 5 %   Monocytes Absolute 0.7 0.1 - 1.0 K/uL   Eosinophils Relative 1 %   Eosinophils Absolute 0.2 0.0 - 0.5 K/uL   Basophils Relative 1 %   Basophils Absolute 0.1 0.0 - 0.1 K/uL   WBC Morphology MORPHOLOGY UNREMARKABLE    RBC Morphology MORPHOLOGY UNREMARKABLE    Smear Review Normal platelet morphology    Immature Granulocytes 1 %   Abs Immature Granulocytes 0.12 (H) 0.00 - 0.07 K/uL    Comment: Performed at [facility], [address] [place], [place] [address]  Comprehensive metabolic panel     Status: Abnormal   Collection Time: 02/09/23  6:39 PM  Result Value Ref Range   Sodium 137 135 - 145 mmol/L   Potassium 2.5 (LL) 3.5 - 5.1 mmol/L    Comment: CRITICAL RESULT CALLED TO, READ BACK BY AND VERIFIED WITH LISA THOMPSON 02/09/23 1911 MU    Chloride 91 (L) 98 - 111 mmol/L   CO2 33 (H) 22 - 32 mmol/L   Glucose, Bld 125 (H) 70 - 99 mg/dL    Comment: Glucose reference range applies only to samples taken after fasting for at least 8 hours.   BUN 14 8 - 23 mg/dL   Creatinine, Ser 8.46 (H) 0.44 - 1.00 mg/dL   Calcium 7.5 (L) 8.9 - 10.3 mg/dL   Total Protein 5.5 (L) 6.5 - 8.1 g/dL   Albumin 2.0 (L) 3.5 - 5.0 g/dL   AST 24 15 - 41 U/L   ALT 10 0 - 44 U/L   Alkaline Phosphatase 106 38 - 126 U/L   Total Bilirubin 0.7 0.3 - 1.2 mg/dL   GFR, Estimated 47 (L) >60 mL/min    Comment: (NOTE) Calculated using the CKD-EPI Creatinine Equation (2021)    Anion gap 13 5 - 15    Comment: Performed at [facility], [address] [place], [place] [address]  Blood culture (single)     Status: None (Preliminary result)   Collection Time: 02/09/23  6:39 PM   Specimen: BLOOD  Result Value Ref Range   Specimen Description BLOOD  BLOOD RIGHT ARM    Special Requests      BOTTLES DRAWN AEROBIC AND ANAEROBIC Blood Culture results may not be optimal due to an excessive volume of blood received in culture bottles   Culture  NO GROWTH < 12 HOURS Performed at [facility], [address] [place], [place] [address]    Report Status PENDING   Procalcitonin     Status: None   Collection Time: 02/09/23  6:41 PM  Result Value Ref Range   Procalcitonin 17.17 ng/mL    Comment:        Interpretation: PCT >= 10 ng/mL: Important systemic inflammatory response, almost exclusively due to severe bacterial sepsis or septic shock. (NOTE)       Sepsis PCT Algorithm           Lower Respiratory Tract                                      Infection PCT Algorithm    ----------------------------     ----------------------------         PCT < 0.25 ng/mL                PCT < 0.10 ng/mL          Strongly encourage             Strongly discourage   discontinuation of antibiotics    initiation of antibiotics    ----------------------------     -----------------------------       PCT 0.25 - 0.50 ng/mL            PCT 0.10 - 0.25 ng/mL               OR       >80% decrease in PCT            Discourage initiation of                                            antibiotics      Encourage discontinuation           of antibiotics    ----------------------------     -----------------------------         PCT >= 0.50 ng/mL              PCT 0.26 - 0.50 ng/mL                AND       <80% decrease in PCT             Encourage initiation of                                             antibiotics       Encourage continuation           of antibiotics    ----------------------------     -----------------------------        PCT >= 0.50 ng/mL                  PCT > 0.50 ng/mL               AND         increase in PCT                  Strongly encourage  initiation of antibiotics    Strongly encourage  escalation           of antibiotics                                     -----------------------------                                           PCT <= 0.25 ng/mL                                                 OR                                        > 80% decrease in PCT                                      Discontinue / Do not initiate                                             antibiotics  Performed at [facility], [address] [place], [place] [address]   C Difficile Quick Screen w PCR reflex     Status: Abnormal   Collection Time: 02/09/23  8:13 PM   Specimen: STOOL  Result Value Ref Range   C Diff antigen POSITIVE (A) NEGATIVE   C Diff toxin NEGATIVE NEGATIVE   C Diff interpretation Results are indeterminate. See PCR results.     Comment: Performed at [facility], [address] [place], [place] [address]  C. Diff by PCR, Reflexed     Status: Abnormal   Collection Time: 02/09/23  8:13 PM  Result Value Ref Range   Toxigenic C. Difficile by PCR POSITIVE (A) NEGATIVE    Comment: Positive for toxigenic C. difficile with little to no toxin production. Only treat if clinical presentation suggests symptomatic illness. Performed at [facility], [address] [place], [place] [address]   Lactic acid, plasma     Status: None   Collection Time: 02/10/23 12:06 AM  Result Value Ref Range   Lactic Acid, Venous 1.2 0.5 - 1.9 mmol/L    Comment: Performed at [facility], [address] [place], [place] [address]  Blood culture (single)     Status: None (Preliminary result)   Collection Time: 02/10/23 12:06 AM   Specimen: BLOOD  Result Value Ref Range   Specimen Description BLOOD  LEFT FOREARM    Special Requests      BOTTLES DRAWN AEROBIC AND ANAEROBIC Blood Culture results may not be optimal due to an inadequate volume of blood received in culture bottles   Culture      NO GROWTH < 12 HOURS Performed at [facility], [address], [place], [place] [address]    Report Status PENDING   Urinalysis, Routine w reflex microscopic -  Urine, Catheterized     Status: Abnormal   Collection Time: 02/10/23 12:06 AM  Result Value Ref Range   Color, Urine YELLOW (A) YELLOW   APPearance CLOUDY (A) CLEAR   Specific Gravity, Urine 1.012 1.005 - 1.030   pH 5.0 5.0 - 8.0   Glucose, UA NEGATIVE NEGATIVE mg/dL   Hgb urine dipstick SMALL (A) NEGATIVE   Bilirubin Urine NEGATIVE NEGATIVE   Ketones, ur 5 (A) NEGATIVE mg/dL   Protein, ur NEGATIVE NEGATIVE mg/dL   Nitrite NEGATIVE NEGATIVE   Leukocytes,Ua LARGE (A) NEGATIVE   RBC / HPF 11-20 0 - 5 RBC/hpf   WBC, UA >50 0 - 5 WBC/hpf   Bacteria, UA NONE SEEN NONE SEEN   Squamous Epithelial / HPF 0-5 0 - 5 /HPF   Mucus PRESENT     Comment: Performed at [facility], [address], [place], [place] [address]  Basic metabolic panel     Status: Abnormal   Collection Time: 02/10/23  4:57 AM  Result Value Ref Range   Sodium 138 135 - 145 mmol/L   Potassium 3.0 (L) 3.5 - 5.1 mmol/L   Chloride 95 (L) 98 - 111 mmol/L   CO2 32 22 - 32 mmol/L   Glucose, Bld 81 70 - 99 mg/dL    Comment: Glucose reference range applies only to samples taken after fasting for at least 8 hours.   BUN 13 8 - 23 mg/dL   Creatinine, Ser 6.04 (H) 0.44 - 1.00 mg/dL   Calcium 7.2 (L) 8.9 - 10.3 mg/dL   GFR, Estimated 51 (L) >60 mL/min    Comment: (NOTE) Calculated using the CKD-EPI Creatinine Equation (2021)    Anion gap 11 5 - 15    Comment: Performed at [facility], [address] [place], [place] [address]  CBC     Status: Abnormal   Collection Time: 02/10/23  4:57 AM  Result Value Ref Range   WBC 12.5 (H) 4.0 - 10.5 K/uL   RBC 2.91 (L) 3.87 - 5.11 MIL/uL   Hemoglobin 8.1 (L) 12.0 - 15.0 g/dL   HCT 11.9 (L) 14.7 - 82.9 %   MCV 91.1 80.0 - 100.0 fL   MCH 27.8 26.0 - 34.0 pg   MCHC 30.6 30.0 - 36.0 g/dL   RDW 56.2 (H) 13.0 - 86.5 %   Platelets 331 150 - 400 K/uL   nRBC  0.0 0.0 - 0.2 %    Comment: Performed at [facility], [address] [place], [place] [address]   DG Chest Port 1 View  Result Date: 02/10/2023 CLINICAL DATA:  Weakness EXAM: PORTABLE CHEST 1 VIEW COMPARISON:  Chest radiograph 11/02/2018 FINDINGS: Stable cardiomediastinal silhouette. Low lung volumes accentuate pulmonary vascularity. Chronic bilateral bronchitic changes. No focal consolidation, pleural effusion, or pneumothorax. Left basilar calcified granuloma. Remote right rib fractures. Advanced arthritis left shoulder. IMPRESSION: Low lung volumes with chronic bronchitic changes. Electronically Signed   By: Minerva Fester M.D.   On: 02/10/2023 01:03    Pending Labs Unresulted Labs (From admission, onward)     Start     Ordered   02/17/23 0500  Creatinine, serum  (enoxaparin (LOVENOX)    CrCl >/= 30 ml/min)  Weekly,   R     Comments: while on enoxaparin therapy    02/10/23 0119   02/10/23 0118  Culture, blood (Routine X 2) w Reflex to ID Panel  BLOOD CULTURE X 2,   R     Comments: From  picc line    02/10/23 0121   02/09/23 2345  Urine Culture  Once,   URGENT       Question:  Indication  Answer:  Sepsis   02/09/23 2344            Vitals/Pain Today's Vitals   02/10/23 1230 02/10/23 1300 02/10/23 1330 02/10/23 1353  BP: 118/64 119/89 (!) 127/51   Pulse: 79 78 73   Resp: Temp:    99.2 F (37.3 C)  TempSrc:    Oral  SpO2: 100% 100% 100%   Weight:      Height:      PainSc:        Isolation Precautions Enteric precautions (UV disinfection)  Medications Medications  vancomycin (VANCOCIN) capsule 125 mg (125 mg Oral Given 02/10/23 1345)  nystatin ointment (MYCOSTATIN) (0 Applications Topical Hold 02/10/23 0124)  carvedilol (COREG) tablet 3.125 mg (3.125 mg Oral Given 02/10/23 0808)  PARoxetine (PAXIL) tablet 30 mg (30 mg Oral Given 02/10/23 0945)  pantoprazole (PROTONIX) EC tablet 40 mg (40 mg Oral Given 02/10/23 0945)  ipratropium-albuterol (DUONEB)  0.5-2.5 (3) MG/3ML nebulizer solution 3 mL (has no administration in time range)  zinc oxide 20 % ointment 1 Application (has no administration in time range)  enoxaparin (LOVENOX) injection 40 mg (40 mg Subcutaneous Given 02/10/23 0943)  acetaminophen (TYLENOL) tablet 650 mg (has no administration in time range)    Or  acetaminophen (TYLENOL) suppository 650 mg (has no administration in time range)  ondansetron (ZOFRAN) tablet 4 mg (has no administration in time range)    Or  ondansetron (ZOFRAN) injection 4 mg (has no administration in time range)  HYDROcodone-acetaminophen (NORCO/VICODIN) 5-325 MG per tablet 1-2 tablet (has no administration in time range)  morphine (PF) 2 MG/ML injection 2 mg (has no administration in time range)  sodium chloride 0.9 % bolus 1,000 mL (0 mLs Intravenous Stopped 02/10/23 0546)  potassium chloride 10 mEq in 100 mL IVPB (0 mEq Intravenous Stopped 02/10/23 0700)  potassium chloride 10 mEq in 100 mL IVPB (0 mEq Intravenous Stopped 02/10/23 0121)  fentaNYL (SUBLIMAZE) injection 50 mcg (50 mcg Intravenous Given 02/10/23 0019)  potassium chloride 10 mEq in 100 mL IVPB (0 mEq Intravenous Stopped 02/10/23 0520)  sodium chloride 0.9 % bolus 500 mL (0 mLs Intravenous Stopped 02/10/23 0752)    Mobility non-ambulatory     Focused Assessments Neuro Assessment Handoff:  Swallow screen pass? Yes          Neuro Assessment: Exceptions to WDL Neuro Checks:      Has TPA been given? No If patient is a Neuro Trauma and patient is going to OR before floor call report to 4N Charge nurse: [phone] or [phone]   R Recommendations: See Admitting Provider Note  Report given to:   Additional Notes:

## 2023-02-10 NOTE — Assessment & Plan Note (Signed)
Chronic respiratory failure with hypoxia Not acutely exacerbated Continue home inhalers with DuoNebs as needed Continue supplemental oxygen and titrate as needed to keep sats over 90 to 92%.

## 2023-02-10 NOTE — Assessment & Plan Note (Signed)
Continue gabapentin.

## 2023-02-10 NOTE — Hospital Course (Addendum)
Mikayla [name] is a 82 y.o. female with medical history significant for Parkinson's disease, COPD on O2 at 3 L,, A-fib, hypertension, with remote history of a 2-month hospitalization in 2017 for empyema s/p right lung decortication SNF resident since and  total care dependent requiring assistance for all ADLs, with chronic sacral wound, currently undergoing IV treatment via PICC line (present for weeks)   for sepsis secondary to UTI x 2, who was sent to the ED with a concern for continued infection/elevated WBC in spite of antibiotic treatment.  She has had no fever or chills, but she was noted to have a diaper rash and soft stool in her diaper.  04/16: ED course and data review: BP 167/95 with otherwise normal vitals Labs significant for WBC of 16,000 with normal lactic acid and a Pro-Calc of 17, hemoglobin 10.4 which appears to be chronic. Potassium is 2.5, creatinine 1.17 at baseline. C. difficile antigen positive. Patient was given potassium supplementation and hospitalist consulted for admission. Leukocytosis d/t C. difficile versus UTI versus bacteremia given prolonged PICC x weeks. Treating CDiff. Holding Invanz abx pending UA/UCx. 04/17: UA abnormal, will start Abx for UTI given illness severity and UTI risk factors. Await cultures. Spoke w/ daughter in evening who reports patient is looking more like her baseline.  04/18: low K down to 2.5. Awaiting cultures. Restarting fluids d/t soft BP. D/c abx for UTI - likely colonized and no symptoms at this time. Continuing Foley d/t incontinence and skin breakdown. Flexiseal placed.  04/19: BCx NGx2d, UCx pending identification and susceptibilities. Stool still loose/liquid, maintain flexiseal for now.  04/20: still fairly substantial liquid stool output.  04/21: stool output easing up, removing rectal tube and SNF should be able to take her back tomorrow, cannot today.  04/22: remains stable, BM improving - less frequent and more solid   Consultants:   none  Procedures: none      ASSESSMENT & PLAN:   Principal Problem:   Leukocytosis Active Problems:   C. difficile diarrhea   Hypokalemia   HTN (hypertension)   PAF (paroxysmal atrial fibrillation)   Parkinson's disease   Functional quadriplegia   Sacral decubitus ulcer   COPD (chronic obstructive pulmonary disease)   Chronic respiratory failure with hypoxia   Stage 3a chronic kidney disease   Candidal intertrigo   C. difficile colitis   Asymptomatic bacteriuria - enterococcus faecalis, pseudomonas aeruginosa   Pseudomonas aeruginosa colonization   Leukocytosis - resolved Likely related to C. difficile versus recent UTI  Treated for C. difficile as outlined under respective problem D/c abx for UTI - likely colonized and no symptoms at this time or prior to hospital arrival Removed PICC  C. difficile diarrhea - improving  Oral vancomycin Enteric precautions Frequent hygiene checks and peri-care    Urinary incontinence Recurrent UTI Colonized w/ enterococcus and pseudomonas Continue Foley for now given skin breakdown and incontinence as well as diarrhea FREQUENT PERI-CARE  Weekly Foley changes at least, remove Foley ASAP  Hypokalemia - resolved Suspect secondary to diarrhea IV and oral repletion and monitor  Hypomagnesemia Replaced Follow outpatient   PAF (paroxysmal atrial fibrillation) Continue amiodarone Holding carvedilol d/t lower BP  Parkinson's disease Continue gabapentin  HTN (hypertension) Holding home carvedilol d/t low BP but BP is improving, restart possible outpatient  Holding Lasix given recent hypokalemia  Functional quadriplegia d/t CVA sequelae  Sacral decubitus ulcer Supportive care and decubitus precautions Continuing Foley d/t incontinence and skin breakdown  COPD (chronic obstructive pulmonary disease) Chronic respiratory  failure with hypoxia Not acutely exacerbated Continue home inhalers with DuoNebs as needed Continue  supplemental oxygen and titrate as needed to keep sats over 90 to 92%.  Stage 3a chronic kidney disease Renal function at baseline Monitor BMP  Asymptomatic Bacteriuria: Enterococcus and Pseudomonas Maintain contact precautions   Candidal intertrigo Nystatin cream and barrier precautions. Diflucan po 150 mg x1    DVT prophylaxis: lovenox  Pertinent IV fluids/nutrition: no continuous IV fluids  Central lines / invasive devices: Foley catheter inserted 02/10/23, Flexiseal rectal tube inserted 04/18  Code Status: DNR ACP documents reviewed 02/10/23 and confirmed verbally w/ daughter [name]    Current Admission Status: inpatient   TOC needs / Dispo plan: anticipate back to [facility] likely tomorrow  Barriers to discharge / significant pending items: clinical improvement in diarrhea hopefully discharge tomorrow (TOC has confirmed can accept her tomorrow but not today)

## 2023-02-10 NOTE — Assessment & Plan Note (Addendum)
  Possibly related to C. difficile versus recent UTI versus bacteremia given prolonged PICC x weeks Treated for C. difficile as outlined under respective problem Follow-up UA and assess for need for continued antibiotics. Follow blood cultures Consider ID consult

## 2023-02-10 NOTE — Assessment & Plan Note (Addendum)
Suspect secondary to diarrhea IV and oral repletion and monitor

## 2023-02-10 NOTE — Progress Notes (Addendum)
PROGRESS NOTE    Mikayla [name]   BJY:[id number] DOB: 14-Jul-1941  DOA: 02/09/2023 Date of Service: 02/10/23 PCP: Clovis Cao, MD     Brief Narrative / Hospital Course:  Mikayla [name] is a 82 y.o. female with medical history significant for Parkinson's disease, COPD on O2 at 3 L,, A-fib, hypertension, with remote history of a 75-month hospitalization in 2017 for empyema s/p right lung decortication SNF resident since and  total care dependent requiring assistance for all ADLs, with chronic sacral wound, currently undergoing IV treatment via PICC line (present for weeks)   for sepsis secondary to UTI x 2, who was sent to the ED with a concern for continued infection/elevated WBC in spite of antibiotic treatment.  She has had no fever or chills, but she was noted to have a diaper rash and soft stool in her diaper.  04/16: ED course and data review: BP 167/95 with otherwise normal vitals Labs significant for WBC of 16,000 with normal lactic acid and a Pro-Calc of 17, hemoglobin 10.4 which appears to be chronic. Potassium is 2.5, creatinine 1.17 at baseline. C. difficile antigen positive. Patient was given potassium supplementation and hospitalist consulted for admission. Leukocytosis d/t C. difficile versus UTI versus bacteremia given prolonged PICC x weeks. Treating CDiff. Holding Invanz abx pending UA/UCx. 04/17: UA abnormal, will start Abx for UTI given illness severity and UTI risk factors. Await cultures.   Consultants:  none  Procedures: none      ASSESSMENT & PLAN:   Principal Problem:   Leukocytosis Active Problems:   C. difficile diarrhea   Hypokalemia   HTN (hypertension)   PAF (paroxysmal atrial fibrillation)   Parkinson's disease   Functional quadriplegia   Sacral decubitus ulcer   COPD (chronic obstructive pulmonary disease)   Chronic respiratory failure with hypoxia   Stage 3a chronic kidney disease   Urinary tract infection   Candidal  intertrigo   Leukocytosis Possibly related to C. difficile versus recent UTI versus bacteremia given prolonged PICC x weeks Treated for C. difficile as outlined under respective problem Treat for UTI as well Follow blood culture and urine culture Consider ID consult pending culture results   C. difficile diarrhea Oral vancomycin Enteric precautions  Hypokalemia Suspect secondary to diarrhea IV and oral repletion and monitor  PAF (paroxysmal atrial fibrillation) Continue carvedilol and amiodarone  Parkinson's disease Continue gabapentin  HTN (hypertension) Continue carvedilol Hold Lasix given hypokalemia  Functional quadriplegia Sacral decubitus ulcer Supportive care and decubitus precautions  COPD (chronic obstructive pulmonary disease) Chronic respiratory failure with hypoxia Not acutely exacerbated Continue home inhalers with DuoNebs as needed Continue supplemental oxygen and titrate as needed to keep sats over 90 to 92%.  Stage 3a chronic kidney disease Renal function at baseline Monitor BMP  Urinary tract infection Patient currently on Invanz via PICC line. This is patient's second course of IV antibiotics for UTI to report from rehab facility In the setting of acute C. difficile infection will hold Invanz pending urine analysis/urine culture to evaluate for need for continued antibiotic treatment.    Candidal intertrigo Nystatin cream and barrier precautions. Diflucan po 150 mg x1    DVT prophylaxis: lovenox  Pertinent IV fluids/nutrition: no continuous IV fluids  Central lines / invasive devices: Foley catheter inserted 02/10/23   Code Status: DNR  Current Admission Status: inpatient   TOC needs / Dispo plan: anticipate back to [facility] when stable  Barriers to discharge / significant pending items: clinical improvement, PT/OT eval,  culture results              Subjective / Brief ROS:  Patient reports no concerns Denies CP/SOB.   Pain controlled.     Family Communication: called daughter [name] (signatory on the MOST form) and left HIPAA compliant vm 02/10/23 3:56 PM, spoke to other daughter [name] by phone 02/10/23 6:00 PM     Objective Findings:  Vitals:   02/10/23 1230 02/10/23 1300 02/10/23 1330 02/10/23 1353  BP: 118/64 119/89 (!) 127/51   Pulse: 79 78 73   Resp: 19 20 18    Temp:    99.2 F (37.3 C)  TempSrc:    Oral  SpO2: 100% 100% 100%   Weight:      Height:        Intake/Output Summary (Last 24 hours) at 02/10/2023 1553 Last data filed at 02/10/2023 0546 Gross per 24 hour  Intake 1330.95 ml  Output --  Net 1330.95 ml   Filed Weights   02/09/23 1835  Weight: 65.8 kg    Examination:  Physical Exam Constitutional:      General: She is not in acute distress.    Appearance: She is obese.  Cardiovascular:     Rate and Rhythm: Normal rate and regular rhythm.  Pulmonary:     Effort: Pulmonary effort is normal.     Breath sounds: Normal breath sounds.  Abdominal:     General: Abdomen is flat.     Palpations: Abdomen is soft.  Skin:    General: Skin is warm and dry.     Findings: Rash (c/w vaginal candidia / diaper rash irritation) present.  Neurological:     General: No focal deficit present.     Mental Status: She is alert. Mental status is at baseline.  Psychiatric:        Mood and Affect: Mood normal.        Behavior: Behavior normal.          Scheduled Medications:   amiodarone  200 mg Oral Daily   carbidopa-levodopa  1 tablet Oral TID   carvedilol  3.125 mg Oral BID WC   CENTROVITE  1 tablet Oral Daily   enoxaparin (LOVENOX) injection  40 mg Subcutaneous Q24H   ferrous sulfate  1 tablet Oral Daily   fluconazole  150 mg Oral Once   furosemide  20 mg Oral Daily   Ipratropium-Albuterol  1 puff Inhalation TID   nystatin ointment   Topical Once   Olopatadine HCl  1 drop Both Eyes Daily   pantoprazole  40 mg Oral BID   PARoxetine  30 mg Oral Daily   vancomycin   125 mg Oral QID   zinc oxide  1 Application Topical Q8H    Continuous Infusions:  cefTRIAXone (ROCEPHIN)  IV      PRN Medications:  acetaminophen **OR** acetaminophen, guaiFENesin, HYDROcodone-acetaminophen, ipratropium-albuterol, morphine injection, ondansetron **OR** ondansetron (ZOFRAN) IV  Antimicrobials from admission:  Anti-infectives (From admission, onward)    Start     Dose/Rate Route Frequency Ordered Stop   02/10/23 1600  cefTRIAXone (ROCEPHIN) 1 g in sodium chloride 0.9 % 100 mL IVPB        1 g 200 mL/hr over 30 Minutes Intravenous Every 24 hours 02/10/23 1547     02/10/23 1600  fluconazole (DIFLUCAN) tablet 150 mg        150 mg Oral  Once 02/10/23 1552     02/10/23 1000  vancomycin (VANCOCIN) capsule 125 mg  Status:  Discontinued  125 mg Oral 4 times daily 02/10/23 0121 02/10/23 0127   02/10/23 0100  vancomycin (VANCOCIN) capsule 125 mg        125 mg Oral 4 times daily 02/09/23 2346 02/19/23 2159           Data Reviewed:  I have personally reviewed the following...  CBC: Recent Labs  Lab 02/09/23 1839 02/10/23 0457  WBC 15.9* 12.5*  NEUTROABS 13.0*  --   HGB 10.4* 8.1*  HCT 34.2* 26.5*  MCV 91.4 91.1  PLT 250 331   Basic Metabolic Panel: Recent Labs  Lab 02/09/23 1839 02/10/23 0457  NA 137 138  K 2.5* 3.0*  CL 91* 95*  CO2 33* 32  GLUCOSE 125* 81  BUN 14 13  CREATININE 1.17* 1.08*  CALCIUM 7.5* 7.2*   GFR: Estimated Creatinine Clearance: 36.6 mL/min (A) (by C-G formula based on SCr of 1.08 mg/dL (H)). Liver Function Tests: Recent Labs  Lab 02/09/23 1839  AST 24  ALT 10  ALKPHOS 106  BILITOT 0.7  PROT 5.5*  ALBUMIN 2.0*   No results for input(s): "LIPASE", "AMYLASE" in the last 168 hours. No results for input(s): "AMMONIA" in the last 168 hours. Coagulation Profile: No results for input(s): "INR", "PROTIME" in the last 168 hours. Cardiac Enzymes: No results for input(s): "CKTOTAL", "CKMB", "CKMBINDEX", "TROPONINI" in the  last 168 hours. BNP (last 3 results) No results for input(s): "PROBNP" in the last 8760 hours. HbA1C: No results for input(s): "HGBA1C" in the last 72 hours. CBG: No results for input(s): "GLUCAP" in the last 168 hours. Lipid Profile: No results for input(s): "CHOL", "HDL", "LDLCALC", "TRIG", "CHOLHDL", "LDLDIRECT" in the last 72 hours. Thyroid Function Tests: No results for input(s): "TSH", "T4TOTAL", "FREET4", "T3FREE", "THYROIDAB" in the last 72 hours. Anemia Panel: No results for input(s): "VITAMINB12", "FOLATE", "FERRITIN", "TIBC", "IRON", "RETICCTPCT" in the last 72 hours. Most Recent Urinalysis On File:     Component Value Date/Time   COLORURINE YELLOW (A) 02/10/2023 0006   APPEARANCEUR CLOUDY (A) 02/10/2023 0006   LABSPEC 1.012 02/10/2023 0006   PHURINE 5.0 02/10/2023 0006   GLUCOSEU NEGATIVE 02/10/2023 0006   HGBUR SMALL (A) 02/10/2023 0006   BILIRUBINUR NEGATIVE 02/10/2023 0006   KETONESUR 5 (A) 02/10/2023 0006   PROTEINUR NEGATIVE 02/10/2023 0006   NITRITE NEGATIVE 02/10/2023 0006   LEUKOCYTESUR LARGE (A) 02/10/2023 0006   Sepsis Labs: @LABRCNTIP (procalcitonin:4,lacticidven:4) Microbiology: Recent Results (from the past 240 hour(s))  Blood culture (single)     Status: None (Preliminary result)   Collection Time: 02/09/23  6:39 PM   Specimen: BLOOD  Result Value Ref Range Status   Specimen Description BLOOD BLOOD RIGHT ARM  Final   Special Requests   Final    BOTTLES DRAWN AEROBIC AND ANAEROBIC Blood Culture results may not be optimal due to an excessive volume of blood received in culture bottles   Culture   Final    NO GROWTH < 12 HOURS Performed at [facility], [address], [place], [place] [address]    Report Status PENDING  Incomplete  C Difficile Quick Screen w PCR reflex     Status: Abnormal   Collection Time: 02/09/23  8:13 PM   Specimen: STOOL  Result Value Ref Range Status   C Diff antigen POSITIVE (A) NEGATIVE Final   C Diff toxin  NEGATIVE NEGATIVE Final   C Diff interpretation Results are indeterminate. See PCR results.  Final    Comment: Performed at [facility], [address] [place], [place] [address]  C. Diff by PCR, Reflexed     Status: Abnormal   Collection Time: 02/09/23  8:13 PM  Result Value Ref Range Status   Toxigenic C. Difficile by PCR POSITIVE (A) NEGATIVE Final    Comment: Positive for toxigenic C. difficile with little to no toxin production. Only treat if clinical presentation suggests symptomatic illness. Performed at [facility], [address] [place], [place] [address]   Blood culture (single)     Status: None (Preliminary result)   Collection Time: 02/10/23 12:06 AM   Specimen: BLOOD  Result Value Ref Range Status   Specimen Description BLOOD  LEFT FOREARM  Final   Special Requests   Final    BOTTLES DRAWN AEROBIC AND ANAEROBIC Blood Culture results may not be optimal due to an inadequate volume of blood received in culture bottles   Culture   Final    NO GROWTH < 12 HOURS Performed at [facility], [address] [place], [place] [address]    Report Status PENDING  Incomplete      Radiology Studies last 3 days: DG Chest Port 1 View  Result Date: 02/10/2023 CLINICAL DATA:  Weakness EXAM: PORTABLE CHEST 1 VIEW COMPARISON:  Chest radiograph 11/02/2018 FINDINGS: Stable cardiomediastinal silhouette. Low lung volumes accentuate pulmonary vascularity. Chronic bilateral bronchitic changes. No focal consolidation, pleural effusion, or pneumothorax. Left basilar calcified granuloma. Remote right rib fractures. Advanced arthritis left shoulder. IMPRESSION: Low lung volumes with chronic bronchitic changes. Electronically Signed   By: Minerva Fester M.D.   On: 02/10/2023 01:03             LOS: 0 days    Sunnie Nielsen, DO [facility] 02/10/2023, 3:53 PM    Dictation software may have been used to generate the above note. Typos may occur and  escape review in typed/dictated notes. Please contact Dr Lyn Hollingshead directly for clarity if needed.  Staff may message me via secure chat in Epic  but this may not receive an immediate response,  please page me for urgent matters!  If 7PM-7AM, please contact night coverage [email or web address]

## 2023-02-10 NOTE — Assessment & Plan Note (Signed)
Continue carvedilol and amiodarone

## 2023-02-10 NOTE — H&P (Signed)
History and Physical    Patient: Mikayla [name] DOB: 1940/11/05 DOA: 02/09/2023 DOS: the patient was seen and examined on 02/10/2023 PCP: Clovis Cao, MD  Patient coming from: Home  Chief Complaint: No chief complaint on file.   HPI: Mikayla [name] is a 82 y.o. female with medical history significant for Parkinson's disease, COPD on O2 at 3 L,, A-fib, hypertension, with remote history of a 36-month hospitalization in 2017 for empyema s/p right lung decortication SNF resident since and  total care dependent requiring assistance for all ADLs, with chronic sacral wound, currently undergoing IV treatment via PICC line (present for weeks)   for sepsis secondary to UTI x 2, who was sent to the ED with a concern for continued infection/elevated WBC in spite of antibiotic treatment.  She has had no fever or chills, but she was noted to have a diaper rash and soft stool in her diaper. ED course and data review: BP 167/95 with otherwise normal vitals  Labs significant for WBC of 16,000 with normal lactic acid and a Pro-Calc of 17, hemoglobin 10.4 which appears to be chronic.  Potassium is 2.5, creatinine 1.17 at baseline.  C. difficile antigen positive. EKG, personally viewed and interpreted showing a flutter at 86 with nonspecific ST-T wave changes.  Chest x-ray pending Patient was given potassium supplementation and hospitalist consulted for admission.   Review of Systems: As mentioned in the history of present illness. All other systems reviewed and are negative.  Past Medical History:  Diagnosis Date   Anemia    Atrial fibrillation    CHF (congestive heart failure)    Empyema    in [facility] Aug 2017- stayed in hospital on for 2 months.   GERD (gastroesophageal reflux disease)    Hypertension    Insomnia    Major depressive disorder    Osteoarthritis    Parkinson disease    Past Surgical History:  Procedure Laterality Date   EMBOLIZATION N/A 11/03/2018   Procedure:  EMBOLIZATION;  Surgeon: Annice Needy, MD;  Location: [facility] INVASIVE CV LAB;  Service: Cardiovascular;  Laterality: N/A;   ERCP N/A 08/23/2018   Procedure: ENDOSCOPIC RETROGRADE CHOLANGIOPANCREATOGRAPHY (ERCP);  Surgeon: Midge Minium, MD;  Location: [facility] ENDOSCOPY;  Service: Endoscopy;  Laterality: N/A;   PEG TUBE PLACEMENT     Social History:  reports that she has quit smoking. Her smoking use included cigarettes. She has never used smokeless tobacco. She reports that she does not drink alcohol and does not use drugs.  Allergies  Allergen Reactions   Levaquin [Levofloxacin]    Other Nausea And Vomiting   Sulfa Antibiotics Hives   Oxycodone Anxiety and Other (See Comments)    Family History  Problem Relation Age of Onset   Hypertension Mother    COPD Mother    Lung cancer Father    Liver cancer Sister     Prior to Admission medications   Medication Sig Start Date End Date Taking? Authorizing Provider  acetaminophen (TYLENOL) 325 MG tablet Take 650 mg by mouth 3 (three) times daily.    [provider]  amiodarone (PACERONE) 200 MG tablet Take 1 tablet (200 mg total) by mouth daily. 11/22/16   Shaune Pollack, MD  ammonium lactate (AMLACTIN) 12 % cream Apply 1 application topically as needed for dry skin.    [provider]  carvedilol (COREG) 3.125 MG tablet Take 1 tablet (3.125 mg total) by mouth 2 (two) times daily with a meal. 08/12/18   Elisabeth Pigeon,  Heath Gold, MD  ferrous sulfate 324 (65 Fe) MG TBEC Take 1 tablet by mouth daily.    [provider]  furosemide (LASIX) 20 MG tablet Take 1 tablet (20 mg total) by mouth daily. 10/30/16   Milagros Loll, MD  gabapentin (NEURONTIN) 100 MG capsule Take 100 mg by mouth at bedtime.    [provider]  guaiFENesin (MUCINEX) 600 MG 12 hr tablet Take 600 mg by mouth 2 (two) times daily as needed for cough.    [provider]  Ipratropium-Albuterol (COMBIVENT RESPIMAT) 20-100 MCG/ACT AERS respimat Inhale 1  puff into the lungs 3 (three) times daily.    [provider]  ipratropium-albuterol (DUONEB) 0.5-2.5 (3) MG/3ML SOLN Take 3 mLs by nebulization every 6 (six) hours as needed.    [provider]  Multiple Vitamins-Minerals (CENTROVITE) TABS Take 1 tablet by mouth daily.    [provider]  nitrofurantoin, macrocrystal-monohydrate, (MACROBID) 100 MG capsule Take 100 mg by mouth 2 (two) times daily. 03/19/22   [provider]  nystatin (MYCOSTATIN/NYSTOP) powder Apply topically 2 (two) times daily as needed. APPLY UNDER NECK TWICE DAILY AS NEEDED FOR YEAST Patient not taking: Reported on 04/18/2021    [provider]  omeprazole (PRILOSEC OTC) 20 MG tablet Take 20 mg by mouth 2 (two) times daily.    [provider]  OXYGEN Inhale 2 L/min into the lungs continuous.    [provider]  PARoxetine (PAXIL) 30 MG tablet Take 30 mg by mouth daily.     [provider]  senna (SENOKOT) 8.6 MG TABS tablet Take 2 tablets by mouth at bedtime.     [provider]  zinc oxide 20 % ointment Apply 1 application topically every 8 (eight) hours. To groin and thighs each shift    [provider]    Physical Exam: Vitals:   02/09/23 1835 02/09/23 2330 02/10/23 0004  BP: (!) 167/95 (!) 129/37   Pulse: 87 90   Resp: 20 (!) 26   Temp: 98.4 F (36.9 C)  99.2 F (37.3 C)  TempSrc: Oral  Rectal  SpO2: 95% 100%   Weight: 65.8 kg    Height: 5\' 3"  (1.6 m)     Physical Exam Vitals and nursing note reviewed.  Constitutional:      General: She is not in acute distress.    Appearance: She is obese.     Comments: Chronically ill-appearing deconditioned female with contractures and tremors in hands  HENT:     Head: Normocephalic and atraumatic.  Cardiovascular:     Rate and Rhythm: Normal rate and regular rhythm.     Heart sounds: Normal heart sounds.  Pulmonary:     Effort: Pulmonary effort is normal.     Breath sounds:  Normal breath sounds.  Abdominal:     Palpations: Abdomen is soft.     Tenderness: There is no abdominal tenderness.  Neurological:     Mental Status: Mental status is at baseline.     Labs on Admission: I have personally reviewed following labs and imaging studies  CBC: Recent Labs  Lab 02/09/23 1839  WBC 15.9*  NEUTROABS 13.0*  HGB 10.4*  HCT 34.2*  MCV 91.4  PLT 250   Basic Metabolic Panel: Recent Labs  Lab 02/09/23 1839  NA 137  K 2.5*  CL 91*  CO2 33*  GLUCOSE 125*  BUN 14  CREATININE 1.17*  CALCIUM 7.5*   GFR: Estimated Creatinine Clearance: 33.8 mL/min (A) (by C-G  formula based on SCr of 1.17 mg/dL (H)). Liver Function Tests: Recent Labs  Lab 02/09/23 1839  AST 24  ALT 10  ALKPHOS 106  BILITOT 0.7  PROT 5.5*  ALBUMIN 2.0*   No results for input(s): "LIPASE", "AMYLASE" in the last 168 hours. No results for input(s): "AMMONIA" in the last 168 hours. Coagulation Profile: No results for input(s): "INR", "PROTIME" in the last 168 hours. Cardiac Enzymes: No results for input(s): "CKTOTAL", "CKMB", "CKMBINDEX", "TROPONINI" in the last 168 hours. BNP (last 3 results) No results for input(s): "PROBNP" in the last 8760 hours. HbA1C: No results for input(s): "HGBA1C" in the last 72 hours. CBG: No results for input(s): "GLUCAP" in the last 168 hours. Lipid Profile: No results for input(s): "CHOL", "HDL", "LDLCALC", "TRIG", "CHOLHDL", "LDLDIRECT" in the last 72 hours. Thyroid Function Tests: No results for input(s): "TSH", "T4TOTAL", "FREET4", "T3FREE", "THYROIDAB" in the last 72 hours. Anemia Panel: No results for input(s): "VITAMINB12", "FOLATE", "FERRITIN", "TIBC", "IRON", "RETICCTPCT" in the last 72 hours. Urine analysis:    Component Value Date/Time   COLORURINE YELLOW (A) 11/18/2020 0943   APPEARANCEUR CLEAR (A) 11/18/2020 0943   LABSPEC 1.006 11/18/2020 0943   PHURINE 7.0 11/18/2020 0943   GLUCOSEU NEGATIVE 11/18/2020 0943   HGBUR SMALL (A)  11/18/2020 0943   BILIRUBINUR NEGATIVE 11/18/2020 0943   KETONESUR NEGATIVE 11/18/2020 0943   PROTEINUR NEGATIVE 11/18/2020 0943   NITRITE NEGATIVE 11/18/2020 0943   LEUKOCYTESUR NEGATIVE 11/18/2020 0943    Radiological Exams on Admission: No results found.   Data Reviewed: Relevant notes from primary care and specialist visits, past discharge summaries as available in EHR, including Care Everywhere. Prior diagnostic testing as pertinent to current admission diagnoses Updated medications and problem lists for reconciliation ED course, including vitals, labs, imaging, treatment and response to treatment Triage notes, nursing and pharmacy notes and ED provider's notes Notable results as noted in HPI   Assessment and Plan: * Leukocytosis  Possibly related to C. difficile versus recent UTI versus bacteremia given prolonged PICC x weeks Treated for C. difficile as outlined under respective problem Follow-up UA and assess for need for continued antibiotics. Follow blood cultures Consider ID consult  Hypokalemia Suspect secondary to diarrhea IV and oral repletion and monitor  C. difficile diarrhea Oral vancomycin Enteric precautions  Candidal intertrigo Nystatin cream and barrier precautions.  Urinary tract infection Patient currently on Invanz via PICC line In the setting of acute C. difficile infection will hold Invanz pending urine analysis/urine culture to evaluate for need for continued antibiotic treatment.  This is patient's second course of IV antibiotics for UTI to report from rehab facility  Stage 3a chronic kidney disease Renal function at baseline  COPD (chronic obstructive pulmonary disease) Chronic respiratory failure with hypoxia Not acutely exacerbated Continue home inhalers with DuoNebs as needed Continue supplemental oxygen and titrate as needed to keep sats over 90 to 92%.  Functional quadriplegia Sacral decubitus ulcer Supportive care and decubitus  precautions  Parkinson's disease Continue gabapentin  PAF (paroxysmal atrial fibrillation) Continue carvedilol and amiodarone  HTN (hypertension) Continue carvedilol Hold Lasix given hypokalemia     DVT prophylaxis: Lovenox  Consults: none  Advance Care Planning:   Code Status: DNR   Family Communication: none  Disposition Plan: Back to previous home environment  Severity of Illness: The appropriate patient status for this patient is INPATIENT. Inpatient status is judged to be reasonable and necessary in order to provide the required intensity of service to ensure the patient's safety. The  patient's presenting symptoms, physical exam findings, and initial radiographic and laboratory data in the context of their chronic comorbidities is felt to place them at high risk for further clinical deterioration. Furthermore, it is not anticipated that the patient will be medically stable for discharge from the hospital within 2 midnights of admission.   * I certify that at the point of admission it is my clinical judgment that the patient will require inpatient hospital care spanning beyond 2 midnights from the point of admission due to high intensity of service, high risk for further deterioration and high frequency of surveillance required.*  Author: Andris Baumann, MD 02/10/2023 12:53 AM  For on call review [email or web address].

## 2023-02-11 DIAGNOSIS — L89159 Pressure ulcer of sacral region, unspecified stage: Secondary | ICD-10-CM

## 2023-02-11 DIAGNOSIS — D72829 Elevated white blood cell count, unspecified: Secondary | ICD-10-CM | POA: Diagnosis not present

## 2023-02-11 DIAGNOSIS — R532 Functional quadriplegia: Secondary | ICD-10-CM

## 2023-02-11 DIAGNOSIS — N1831 Chronic kidney disease, stage 3a: Secondary | ICD-10-CM

## 2023-02-11 DIAGNOSIS — J9611 Chronic respiratory failure with hypoxia: Secondary | ICD-10-CM

## 2023-02-11 DIAGNOSIS — J42 Unspecified chronic bronchitis: Secondary | ICD-10-CM

## 2023-02-11 DIAGNOSIS — I10 Essential (primary) hypertension: Secondary | ICD-10-CM | POA: Diagnosis not present

## 2023-02-11 DIAGNOSIS — E876 Hypokalemia: Secondary | ICD-10-CM

## 2023-02-11 DIAGNOSIS — G20A1 Parkinson's disease without dyskinesia, without mention of fluctuations: Secondary | ICD-10-CM

## 2023-02-11 DIAGNOSIS — B372 Candidiasis of skin and nail: Secondary | ICD-10-CM

## 2023-02-11 DIAGNOSIS — N390 Urinary tract infection, site not specified: Secondary | ICD-10-CM

## 2023-02-11 DIAGNOSIS — A0472 Enterocolitis due to Clostridium difficile, not specified as recurrent: Secondary | ICD-10-CM | POA: Diagnosis not present

## 2023-02-11 DIAGNOSIS — I480 Paroxysmal atrial fibrillation: Secondary | ICD-10-CM

## 2023-02-11 LAB — BASIC METABOLIC PANEL
Anion gap: 9 (ref 5–15)
BUN: 14 mg/dL (ref 8–23)
CO2: 35 mmol/L — ABNORMAL HIGH (ref 22–32)
Calcium: 7.5 mg/dL — ABNORMAL LOW (ref 8.9–10.3)
Chloride: 96 mmol/L — ABNORMAL LOW (ref 98–111)
Creatinine, Ser: 1.17 mg/dL — ABNORMAL HIGH (ref 0.44–1.00)
GFR, Estimated: 47 mL/min — ABNORMAL LOW (ref >60)
Glucose, Bld: 80 mg/dL (ref 70–99)
Potassium: 2.5 mmol/L — CL (ref 3.5–5.1)
Sodium: 140 mmol/L (ref 135–145)

## 2023-02-11 LAB — CBC
HCT: 28.8 % — ABNORMAL LOW (ref 36.0–46.0)
Hemoglobin: 9 g/dL — ABNORMAL LOW (ref 12.0–15.0)
MCH: 28.1 pg (ref 26.0–34.0)
MCHC: 31.3 g/dL (ref 30.0–36.0)
MCV: 90 fL (ref 80.0–100.0)
Platelets: 345 10*3/uL (ref 150–400)
RBC: 3.2 MIL/uL — ABNORMAL LOW (ref 3.87–5.11)
RDW: 17.1 % — ABNORMAL HIGH (ref 11.5–15.5)
WBC: 10.3 10*3/uL (ref 4.0–10.5)
nRBC: 0 % (ref 0.0–0.2)

## 2023-02-11 LAB — MAGNESIUM: Magnesium: 1.5 mg/dL — ABNORMAL LOW (ref 1.7–2.4)

## 2023-02-11 LAB — PROCALCITONIN: Procalcitonin: 6.72 ng/mL

## 2023-02-11 LAB — POTASSIUM: Potassium: 3.2 mmol/L — ABNORMAL LOW (ref 3.5–5.1)

## 2023-02-11 LAB — URINE CULTURE

## 2023-02-11 LAB — LACTIC ACID, PLASMA: Lactic Acid, Venous: 1.1 mmol/L (ref 0.5–1.9)

## 2023-02-11 LAB — CULTURE, BLOOD (ROUTINE X 2): Special Requests: ADEQUATE

## 2023-02-11 LAB — MRSA NEXT GEN BY PCR, NASAL: MRSA by PCR Next Gen: NOT DETECTED

## 2023-02-11 MED ORDER — POTASSIUM CHLORIDE 10 MEQ/100ML IV SOLN
10.0000 meq | INTRAVENOUS | Status: AC
  Administered 2023-02-11 (×4): 10 meq via INTRAVENOUS
  Filled 2023-02-11 (×3): qty 100

## 2023-02-11 MED ORDER — POTASSIUM CHLORIDE CRYS ER 20 MEQ PO TBCR
40.0000 meq | EXTENDED_RELEASE_TABLET | Freq: Once | ORAL | Status: AC
  Administered 2023-02-11: 40 meq via ORAL
  Filled 2023-02-11: qty 2

## 2023-02-11 MED ORDER — SODIUM CHLORIDE 0.9 % IV SOLN: INTRAVENOUS | Status: DC

## 2023-02-11 MED ORDER — CHLORHEXIDINE GLUCONATE CLOTH 2 % EX PADS
6.0000 | MEDICATED_PAD | Freq: Every day | CUTANEOUS | Status: DC
  Administered 2023-02-11 – 2023-02-14 (×4): 6 via TOPICAL

## 2023-02-11 MED ORDER — SODIUM CHLORIDE 0.9% FLUSH
10.0000 mL | Freq: Two times a day (BID) | INTRAVENOUS | Status: DC
  Administered 2023-02-11 – 2023-02-15 (×7): 10 mL

## 2023-02-11 NOTE — Care Management Important Message (Signed)
Important Message  Patient Details  Name: Mikayla [name] MRN: [id number] Date of Birth: Oct 24, 1941   Medicare Important Message Given:  N/A - LOS <3 / Initial given by admissions     Johnell Comings 02/11/2023, 2:54 PM

## 2023-02-11 NOTE — Progress Notes (Addendum)
PROGRESS NOTE    Mikayla [name]   ZHY:[id number] DOB: October 30, 1940  DOA: 02/09/2023 Date of Service: 02/11/23 PCP: Clovis Cao, MD     Brief Narrative / Hospital Course:  Mikayla [name] is a 82 y.o. female with medical history significant for Parkinson's disease, COPD on O2 at 3 L,, A-fib, hypertension, with remote history of a 48-month hospitalization in 2017 for empyema s/p right lung decortication SNF resident since and  total care dependent requiring assistance for all ADLs, with chronic sacral wound, currently undergoing IV treatment via PICC line (present for weeks)   for sepsis secondary to UTI x 2, who was sent to the ED with a concern for continued infection/elevated WBC in spite of antibiotic treatment.  She has had no fever or chills, but she was noted to have a diaper rash and soft stool in her diaper.  04/16: ED course and data review: BP 167/95 with otherwise normal vitals Labs significant for WBC of 16,000 with normal lactic acid and a Pro-Calc of 17, hemoglobin 10.4 which appears to be chronic. Potassium is 2.5, creatinine 1.17 at baseline. C. difficile antigen positive. Patient was given potassium supplementation and hospitalist consulted for admission. Leukocytosis d/t C. difficile versus UTI versus bacteremia given prolonged PICC x weeks. Treating CDiff. Holding Invanz abx pending UA/UCx. 04/17: UA abnormal, will start Abx for UTI given illness severity and UTI risk factors. Await cultures. Spoke w/ daughter in evening who reports patient is looking more like her baseline.  04/18: low K down to 2.5. Awaiting cultures. Restarting fluids d/t soft BP. D/c abx for UTI - likely colonized and no symptoms at this time. Continuing Foley d/t incontinence and skin breakdown  Consultants:  none  Procedures: none      ASSESSMENT & PLAN:   Principal Problem:   Leukocytosis Active Problems:   C. difficile diarrhea   Hypokalemia   HTN (hypertension)   PAF (paroxysmal  atrial fibrillation)   Parkinson's disease   Functional quadriplegia   Sacral decubitus ulcer   COPD (chronic obstructive pulmonary disease)   Chronic respiratory failure with hypoxia   Stage 3a chronic kidney disease   Urinary tract infection   Candidal intertrigo   Leukocytosis Possibly related to C. difficile versus recent UTI versus bacteremia given prolonged PICC x weeks Treated for C. difficile as outlined under respective problem D/c abx for UTI - likely colonized and no symptoms at this time or prior to hospital arrival Follow blood culture and urine culture Consider ID consult pending culture results   C. difficile diarrhea Oral vancomycin Enteric precautions  Hypokalemia Suspect secondary to diarrhea IV and oral repletion and monitor Checking Mg  PAF (paroxysmal atrial fibrillation) Continue amiodarone Holding carvedilol d/t lower BP  Parkinson's disease Continue gabapentin  HTN (hypertension) Holding home carvedilol d/t low BP Hold Lasix given hypokalemia  Functional quadriplegia d/t CVA sequelae  Sacral decubitus ulcer Supportive care and decubitus precautions Continuing Foley d/t incontinence and skin breakdown  COPD (chronic obstructive pulmonary disease) Chronic respiratory failure with hypoxia Not acutely exacerbated Continue home inhalers with DuoNebs as needed Continue supplemental oxygen and titrate as needed to keep sats over 90 to 92%.  Stage 3a chronic kidney disease Renal function at baseline Monitor BMP  Urinary tract infection Patient has been on Invanz via PICC line. This is patient's second course of IV antibiotics for UTI to report from rehab facility Await UCx Ceftriaxone given significantly abnormal UA   Candidal intertrigo Nystatin cream and barrier precautions. Diflucan po  150 mg x1    DVT prophylaxis: lovenox  Pertinent IV fluids/nutrition: no continuous IV fluids  Central lines / invasive devices: Foley catheter  inserted 02/10/23   Code Status: DNR ACP documents reviewed 02/10/23 and confirmed verbally w/ daughter [name]    Current Admission Status: inpatient   TOC needs / Dispo plan: anticipate back to [facility] when stable  Barriers to discharge / significant pending items: clinical improvement, PT/OT eval, culture results              Subjective / Brief ROS:  Patient reports no concerns  Denies urinary urgency/frequency prior to hospital arrival  Denies CP/SOB.  Pain controlled.     Family Communication: called daughter [name] (signatory on the MOST form) and left HIPAA compliant vm 04/17, spoke to other daughter [name] by phone in evening. Daughter [name] at bedside 04/18    Objective Findings:  Vitals:   02/11/23 0428 02/11/23 0742 02/11/23 0820 02/11/23 1343  BP: (!) 130/57  (!) 113/42   Pulse:  75 73 75  Resp: Temp: 98.4 F (36.9 C)  98 F (36.7 C)   TempSrc: Oral  Oral   SpO2: 100% 94% 100% 100%  Weight:      Height:        Intake/Output Summary (Last 24 hours) at 02/11/2023 1344 Last data filed at 02/11/2023 0951 Gross per 24 hour  Intake 530 ml  Output 500 ml  Net 30 ml   Filed Weights   02/09/23 1835  Weight: 65.8 kg    Examination:  Physical Exam Constitutional:      General: She is not in acute distress.    Appearance: She is obese.  Cardiovascular:     Rate and Rhythm: Normal rate and regular rhythm.  Pulmonary:     Effort: Pulmonary effort is normal.     Breath sounds: Normal breath sounds.  Abdominal:     General: Abdomen is flat.     Palpations: Abdomen is soft.  Skin:    General: Skin is warm and dry.     Findings: Rash (c/w vaginal candidia / diaper rash irritation) present.  Neurological:     General: No focal deficit present.     Mental Status: She is alert. Mental status is at baseline.  Psychiatric:        Mood and Affect: Mood normal.        Behavior: Behavior normal.          Scheduled  Medications:   amiodarone  200 mg Oral Daily   carbidopa-levodopa  1 tablet Oral TID   Chlorhexidine Gluconate Cloth  6 each Topical Daily   enoxaparin (LOVENOX) injection  40 mg Subcutaneous Q24H   ferrous sulfate  325 mg Oral Daily   ipratropium-albuterol  3 mL Nebulization TID   multivitamin with minerals  1 tablet Oral Daily   nystatin ointment   Topical Once   olopatadine  1 drop Both Eyes Daily   pantoprazole  40 mg Oral BID   PARoxetine  30 mg Oral Daily   sodium chloride flush  10-40 mL Intracatheter Q12H   vancomycin  125 mg Oral QID   zinc oxide  1 Application Topical Q8H    Continuous Infusions:  sodium chloride 100 mL/hr at 02/11/23 1013    PRN Medications:  acetaminophen **OR** acetaminophen, guaiFENesin, HYDROcodone-acetaminophen, ipratropium-albuterol, morphine injection, ondansetron **OR** ondansetron (ZOFRAN) IV  Antimicrobials from admission:  Anti-infectives (From admission, onward)    Start  Dose/Rate Route Frequency Ordered Stop   02/10/23 2000  cefTRIAXone (ROCEPHIN) 1 g in sodium chloride 0.9 % 100 mL IVPB  Status:  Discontinued        1 g 200 mL/hr over 30 Minutes Intravenous Every 24 hours 02/10/23 1547 02/11/23 1131   02/10/23 1600  fluconazole (DIFLUCAN) tablet 150 mg        150 mg Oral  Once 02/10/23 1552 02/10/23 2024   02/10/23 1000  vancomycin (VANCOCIN) capsule 125 mg  Status:  Discontinued        125 mg Oral 4 times daily 02/10/23 0121 02/10/23 0127   02/10/23 0100  vancomycin (VANCOCIN) capsule 125 mg        125 mg Oral 4 times daily 02/09/23 2346 02/19/23 2159           Data Reviewed:  I have personally reviewed the following...  CBC: Recent Labs  Lab 02/09/23 1839 02/10/23 0457 02/11/23 0628  WBC 15.9* 12.5* 10.3  NEUTROABS 13.0*  --   --   HGB 10.4* 8.1* 9.0*  HCT 34.2* 26.5* 28.8*  MCV 91.4 91.1 90.0  PLT 250 331 345   Basic Metabolic Panel: Recent Labs  Lab 02/09/23 1839 02/10/23 0457 02/11/23 0620  02/11/23 0628  NA 137 138  --  140  K 2.5* 3.0*  --  2.5*  CL 91* 95*  --  96*  CO2 33* 32  --  35*  GLUCOSE 125* 81  --  80  BUN 14 13  --  14  CREATININE 1.17* 1.08*  --  1.17*  CALCIUM 7.5* 7.2*  --  7.5*  MG  --   --  1.5*  --    GFR: Estimated Creatinine Clearance: 33.8 mL/min (A) (by C-G formula based on SCr of 1.17 mg/dL (H)). Liver Function Tests: Recent Labs  Lab 02/09/23 1839  AST 24  ALT 10  ALKPHOS 106  BILITOT 0.7  PROT 5.5*  ALBUMIN 2.0*   No results for input(s): "LIPASE", "AMYLASE" in the last 168 hours. No results for input(s): "AMMONIA" in the last 168 hours. Coagulation Profile: No results for input(s): "INR", "PROTIME" in the last 168 hours. Cardiac Enzymes: No results for input(s): "CKTOTAL", "CKMB", "CKMBINDEX", "TROPONINI" in the last 168 hours. BNP (last 3 results) No results for input(s): "PROBNP" in the last 8760 hours. HbA1C: No results for input(s): "HGBA1C" in the last 72 hours. CBG: No results for input(s): "GLUCAP" in the last 168 hours. Lipid Profile: No results for input(s): "CHOL", "HDL", "LDLCALC", "TRIG", "CHOLHDL", "LDLDIRECT" in the last 72 hours. Thyroid Function Tests: No results for input(s): "TSH", "T4TOTAL", "FREET4", "T3FREE", "THYROIDAB" in the last 72 hours. Anemia Panel: No results for input(s): "VITAMINB12", "FOLATE", "FERRITIN", "TIBC", "IRON", "RETICCTPCT" in the last 72 hours. Most Recent Urinalysis On File:     Component Value Date/Time   COLORURINE YELLOW (A) 02/10/2023 0006   APPEARANCEUR CLOUDY (A) 02/10/2023 0006   LABSPEC 1.012 02/10/2023 0006   PHURINE 5.0 02/10/2023 0006   GLUCOSEU NEGATIVE 02/10/2023 0006   HGBUR SMALL (A) 02/10/2023 0006   BILIRUBINUR NEGATIVE 02/10/2023 0006   KETONESUR 5 (A) 02/10/2023 0006   PROTEINUR NEGATIVE 02/10/2023 0006   NITRITE NEGATIVE 02/10/2023 0006   LEUKOCYTESUR LARGE (A) 02/10/2023 0006   Sepsis Labs: @LABRCNTIP (procalcitonin:4,lacticidven:4) Microbiology: Recent  Results (from the past 240 hour(s))  Blood culture (single)     Status: None (Preliminary result)   Collection Time: 02/09/23  6:39 PM   Specimen: BLOOD  Result Value Ref  Range Status   Specimen Description BLOOD BLOOD RIGHT ARM  Final   Special Requests   Final    BOTTLES DRAWN AEROBIC AND ANAEROBIC Blood Culture results may not be optimal due to an excessive volume of blood received in culture bottles   Culture   Final    NO GROWTH < 12 HOURS Performed at [facility], [address] [place], [place] [address]    Report Status PENDING  Incomplete  C Difficile Quick Screen w PCR reflex     Status: Abnormal   Collection Time: 02/09/23  8:13 PM   Specimen: STOOL  Result Value Ref Range Status   C Diff antigen POSITIVE (A) NEGATIVE Final   C Diff toxin NEGATIVE NEGATIVE Final   C Diff interpretation Results are indeterminate. See PCR results.  Final    Comment: Performed at [facility], [address] [place], [place] [address]  C. Diff by PCR, Reflexed     Status: Abnormal   Collection Time: 02/09/23  8:13 PM  Result Value Ref Range Status   Toxigenic C. Difficile by PCR POSITIVE (A) NEGATIVE Final    Comment: Positive for toxigenic C. difficile with little to no toxin production. Only treat if clinical presentation suggests symptomatic illness. Performed at [facility], [address] [place], [place] [address]   Blood culture (single)     Status: None (Preliminary result)   Collection Time: 02/10/23 12:06 AM   Specimen: BLOOD  Result Value Ref Range Status   Specimen Description BLOOD  LEFT FOREARM  Final   Special Requests   Final    BOTTLES DRAWN AEROBIC AND ANAEROBIC Blood Culture results may not be optimal due to an inadequate volume of blood received in culture bottles   Culture   Final    NO GROWTH < 12 HOURS Performed at [facility], [address] [place], [place] [address]    Report Status PENDING  Incomplete  Urine  Culture     Status: Abnormal (Preliminary result)   Collection Time: 02/10/23 12:06 AM   Specimen: In/Out Cath Urine  Result Value Ref Range Status   Specimen Description   Final    IN/OUT CATH URINE Performed at [facility], [address] [place], [place] [address]    Special Requests   Final    NONE Performed at [facility], [address] [place], [place] [address]    Culture (A)  Final    >=100,000 COLONIES/mL GRAM POSITIVE COCCI >=100,000 COLONIES/mL GRAM NEGATIVE RODS IDENTIFICATION AND SUSCEPTIBILITIES TO FOLLOW Performed at [facility], [address], [place], [place] [address]    Report Status PENDING  Incomplete  MRSA Next Gen by PCR, Nasal     Status: None   Collection Time: 02/10/23 11:15 PM   Specimen: Nasal Mucosa; Nasal Swab  Result Value Ref Range Status   MRSA by PCR Next Gen NOT DETECTED NOT DETECTED Final    Comment: (NOTE) The GeneXpert MRSA Assay (FDA approved for NASAL specimens only), is one component of a comprehensive MRSA colonization surveillance program. It is not intended to diagnose MRSA infection nor to guide or monitor treatment for MRSA infections. Test performance is not FDA approved in patients less than 25 years old. Performed at [facility], [address] [place], [place] [address]       Radiology Studies last 3 days: [facility] Chest [facility] 1 View  Result Date: 02/10/2023 CLINICAL DATA:  Weakness EXAM: PORTABLE  CHEST 1 VIEW COMPARISON:  Chest radiograph 11/02/2018 FINDINGS: Stable cardiomediastinal silhouette. Low lung volumes accentuate pulmonary vascularity. Chronic bilateral bronchitic changes. No focal consolidation, pleural effusion, or pneumothorax. Left basilar calcified granuloma. Remote right rib fractures. Advanced arthritis left shoulder. IMPRESSION: Low lung volumes with chronic bronchitic changes. Electronically Signed   By: Minerva Fester M.D.   On: 02/10/2023 01:03              LOS: 1 day    Sunnie Nielsen, DO [facility] 02/11/2023, 1:44 PM    Dictation software may have been used to generate the above note. Typos may occur and escape review in typed/dictated notes. Please contact Dr Lyn Hollingshead directly for clarity if needed.  Staff may message me via secure chat in Epic  but this may not receive an immediate response,  please page me for urgent matters!  If 7PM-7AM, please contact night coverage [email or web address]

## 2023-02-12 DIAGNOSIS — R8271 Bacteriuria: Secondary | ICD-10-CM | POA: Insufficient documentation

## 2023-02-12 DIAGNOSIS — A0472 Enterocolitis due to Clostridium difficile, not specified as recurrent: Secondary | ICD-10-CM | POA: Diagnosis not present

## 2023-02-12 DIAGNOSIS — Z2239 Carrier of other specified bacterial diseases: Secondary | ICD-10-CM

## 2023-02-12 DIAGNOSIS — J42 Unspecified chronic bronchitis: Secondary | ICD-10-CM | POA: Diagnosis not present

## 2023-02-12 DIAGNOSIS — D72829 Elevated white blood cell count, unspecified: Secondary | ICD-10-CM | POA: Diagnosis not present

## 2023-02-12 DIAGNOSIS — I10 Essential (primary) hypertension: Secondary | ICD-10-CM | POA: Diagnosis not present

## 2023-02-12 LAB — BASIC METABOLIC PANEL
Anion gap: 8 (ref 5–15)
BUN: 11 mg/dL (ref 8–23)
CO2: 29 mmol/L (ref 22–32)
Calcium: 7.3 mg/dL — ABNORMAL LOW (ref 8.9–10.3)
Chloride: 101 mmol/L (ref 98–111)
Creatinine, Ser: 1.05 mg/dL — ABNORMAL HIGH (ref 0.44–1.00)
GFR, Estimated: 53 mL/min — ABNORMAL LOW (ref >60)
Glucose, Bld: 72 mg/dL (ref 70–99)
Potassium: 3.8 mmol/L (ref 3.5–5.1)
Sodium: 138 mmol/L (ref 135–145)

## 2023-02-12 LAB — CBC
HCT: 28.7 % — ABNORMAL LOW (ref 36.0–46.0)
Hemoglobin: 8.7 g/dL — ABNORMAL LOW (ref 12.0–15.0)
MCH: 27.5 pg (ref 26.0–34.0)
MCHC: 30.3 g/dL (ref 30.0–36.0)
MCV: 90.8 fL (ref 80.0–100.0)
Platelets: 351 10*3/uL (ref 150–400)
RBC: 3.16 MIL/uL — ABNORMAL LOW (ref 3.87–5.11)
RDW: 17.6 % — ABNORMAL HIGH (ref 11.5–15.5)
WBC: 12.1 10*3/uL — ABNORMAL HIGH (ref 4.0–10.5)
nRBC: 0 % (ref 0.0–0.2)

## 2023-02-12 LAB — URINE CULTURE: Culture: >=100000 — AB

## 2023-02-12 LAB — CULTURE, BLOOD (ROUTINE X 2)

## 2023-02-12 MED ORDER — ENSURE ENLIVE PO LIQD
237.0000 mL | Freq: Three times a day (TID) | ORAL | Status: DC
  Administered 2023-02-12: 237 mL via ORAL

## 2023-02-12 MED ORDER — VITAMIN C 500 MG PO TABS
500.0000 mg | ORAL_TABLET | Freq: Two times a day (BID) | ORAL | Status: DC
  Administered 2023-02-12 – 2023-02-15 (×6): 500 mg via ORAL
  Filled 2023-02-12 (×6): qty 1

## 2023-02-12 NOTE — Progress Notes (Signed)
PROGRESS NOTE    Mikayla [name]   JYN:[id number] DOB: 1941/08/13  DOA: 02/09/2023 Date of Service: 02/12/23 PCP: Clovis Cao, MD     Brief Narrative / Hospital Course:  Mikayla [name] is a 82 y.o. female with medical history significant for Parkinson's disease, COPD on O2 at 3 L,, A-fib, hypertension, with remote history of a 75-month hospitalization in 2017 for empyema s/p right lung decortication SNF resident since and  total care dependent requiring assistance for all ADLs, with chronic sacral wound, currently undergoing IV treatment via PICC line (present for weeks)   for sepsis secondary to UTI x 2, who was sent to the ED with a concern for continued infection/elevated WBC in spite of antibiotic treatment.  She has had no fever or chills, but she was noted to have a diaper rash and soft stool in her diaper.  04/16: ED course and data review: BP 167/95 with otherwise normal vitals Labs significant for WBC of 16,000 with normal lactic acid and a Pro-Calc of 17, hemoglobin 10.4 which appears to be chronic. Potassium is 2.5, creatinine 1.17 at baseline. C. difficile antigen positive. Patient was given potassium supplementation and hospitalist consulted for admission. Leukocytosis d/t C. difficile versus UTI versus bacteremia given prolonged PICC x weeks. Treating CDiff. Holding Invanz abx pending UA/UCx. 04/17: UA abnormal, will start Abx for UTI given illness severity and UTI risk factors. Await cultures. Spoke w/ daughter in evening who reports patient is looking more like her baseline.  04/18: low K down to 2.5. Awaiting cultures. Restarting fluids d/t soft BP. D/c abx for UTI - likely colonized and no symptoms at this time. Continuing Foley d/t incontinence and skin breakdown. Flexiseal placed.  04/19: BCx NGx2d, UCx pending identification and susceptibilities. Stool still loose/liquid, maintain flexiseal for now.   Consultants:  none  Procedures: none      ASSESSMENT &  PLAN:   Principal Problem:   Leukocytosis Active Problems:   C. difficile diarrhea   Hypokalemia   HTN (hypertension)   PAF (paroxysmal atrial fibrillation)   Parkinson's disease   Functional quadriplegia   Sacral decubitus ulcer   COPD (chronic obstructive pulmonary disease)   Chronic respiratory failure with hypoxia   Stage 3a chronic kidney disease   Candidal intertrigo   C. difficile colitis   Asymptomatic bacteriuria - enterococcus faecalis, pseudomonas aeruginosa   Pseudomonas aeruginosa colonization   Leukocytosis Possibly related to C. difficile versus recent UTI versus bacteremia given prolonged PICC x weeks Treated for C. difficile as outlined under respective problem D/c abx for UTI - likely colonized and no symptoms at this time or prior to hospital arrival Follow blood culture and urine culture Consider ID consult pending culture results   C. difficile diarrhea Oral vancomycin Enteric precautions maintain flexiseal for now.   Hypokalemia Suspect secondary to diarrhea IV and oral repletion and monitor Checking Mg  PAF (paroxysmal atrial fibrillation) Continue amiodarone Holding carvedilol d/t lower BP  Parkinson's disease Continue gabapentin  HTN (hypertension) Holding home carvedilol d/t low BP Hold Lasix given hypokalemia  Functional quadriplegia d/t CVA sequelae  Sacral decubitus ulcer Supportive care and decubitus precautions Continuing Foley d/t incontinence and skin breakdown  COPD (chronic obstructive pulmonary disease) Chronic respiratory failure with hypoxia Not acutely exacerbated Continue home inhalers with DuoNebs as needed Continue supplemental oxygen and titrate as needed to keep sats over 90 to 92%.  Stage 3a chronic kidney disease Renal function at baseline Monitor BMP  Asymptomatic Bacteriuria: Enterococcus and Pseudomonas Maintain  contact precautions   Candidal intertrigo Nystatin cream and barrier  precautions. Diflucan po 150 mg x1    DVT prophylaxis: lovenox  Pertinent IV fluids/nutrition: no continuous IV fluids  Central lines / invasive devices: Foley catheter inserted 02/10/23, Flexiseal rectal tube inserted 04/18  Code Status: DNR ACP documents reviewed 02/10/23 and confirmed verbally w/ daughter [name]    Current Admission Status: inpatient   TOC needs / Dispo plan: anticipate back to [facility] likely tomorrow  Barriers to discharge / significant pending items: clinical improvement in diarrhea hopefully discharge tomorrow (TOC has confirmed can accept her on Saturday)             Subjective / Brief ROS:  Patient reports no concerns  Denies urinary urgency/frequency prior to hospital arrival  Denies CP/SOB.  Pain controlled.     Family Communication: both daughters at bedside today on rounds     Objective Findings:  Vitals:   02/11/23 2003 02/11/23 2145 02/12/23 0620 02/12/23 0755  BP:  130/69 115/68   Pulse:  76 84   Resp:  18 20   Temp:  98.3 F (36.8 C) 98 F (36.7 C)   TempSrc:  Oral    SpO2: 99% 99% 93% 100%  Weight:      Height:        Intake/Output Summary (Last 24 hours) at 02/12/2023 1349 Last data filed at 02/12/2023 0631 Gross per 24 hour  Intake 2370.98 ml  Output 900 ml  Net 1470.98 ml   Filed Weights   02/09/23 1835  Weight: 65.8 kg    Examination:  Physical Exam Constitutional:      General: She is not in acute distress.    Appearance: She is obese.  Cardiovascular:     Rate and Rhythm: Normal rate and regular rhythm.  Pulmonary:     Effort: Pulmonary effort is normal.     Breath sounds: Normal breath sounds.  Abdominal:     General: Abdomen is flat.     Palpations: Abdomen is soft.  Skin:    General: Skin is warm and dry.     Findings: Rash (c/w vaginal candidia / diaper rash irritation) present.  Neurological:     General: No focal deficit present.     Mental Status: She is alert. Mental status is at  baseline.     Motor: Tremor present.  Psychiatric:        Mood and Affect: Mood normal.        Behavior: Behavior normal.          Scheduled Medications:   amiodarone  200 mg Oral Daily   carbidopa-levodopa  1 tablet Oral TID   Chlorhexidine Gluconate Cloth  6 each Topical Daily   enoxaparin (LOVENOX) injection  40 mg Subcutaneous Q24H   ferrous sulfate  325 mg Oral Daily   multivitamin with minerals  1 tablet Oral Daily   nystatin ointment   Topical Once   olopatadine  1 drop Both Eyes Daily   pantoprazole  40 mg Oral BID   PARoxetine  30 mg Oral Daily   sodium chloride flush  10-40 mL Intracatheter Q12H   vancomycin  125 mg Oral QID   zinc oxide  1 Application Topical Q8H    Continuous Infusions:  sodium chloride 100 mL/hr at 02/12/23 0616    PRN Medications:  acetaminophen **OR** acetaminophen, guaiFENesin, HYDROcodone-acetaminophen, ipratropium-albuterol, morphine injection, ondansetron **OR** ondansetron (ZOFRAN) IV  Antimicrobials from admission:  Anti-infectives (From admission, onward)  Start     Dose/Rate Route Frequency Ordered Stop   02/10/23 2000  cefTRIAXone (ROCEPHIN) 1 g in sodium chloride 0.9 % 100 mL IVPB  Status:  Discontinued        1 g 200 mL/hr over 30 Minutes Intravenous Every 24 hours 02/10/23 1547 02/11/23 1131   02/10/23 1600  fluconazole (DIFLUCAN) tablet 150 mg        150 mg Oral  Once 02/10/23 1552 02/10/23 2024   02/10/23 1000  vancomycin (VANCOCIN) capsule 125 mg  Status:  Discontinued        125 mg Oral 4 times daily 02/10/23 0121 02/10/23 0127   02/10/23 0100  vancomycin (VANCOCIN) capsule 125 mg        125 mg Oral 4 times daily 02/09/23 2346 02/19/23 2159           Data Reviewed:  I have personally reviewed the following...  CBC: Recent Labs  Lab 02/09/23 1839 02/10/23 0457 02/11/23 0628 02/12/23 0628  WBC 15.9* 12.5* 10.3 12.1*  NEUTROABS 13.0*  --   --   --   HGB 10.4* 8.1* 9.0* 8.7*  HCT 34.2* 26.5* 28.8*  28.7*  MCV 91.4 91.1 90.0 90.8  PLT 250 331 345 351   Basic Metabolic Panel: Recent Labs  Lab 02/09/23 1839 02/10/23 0457 02/11/23 0620 02/11/23 0628 02/11/23 1341 02/12/23 0628  NA 137 138  --  140  --  138  K 2.5* 3.0*  --  2.5* 3.2* 3.8  CL 91* 95*  --  96*  --  101  CO2 33* 32  --  35*  --  29  GLUCOSE 125* 81  --  80  --  72  BUN 14 13  --  14  --  11  CREATININE 1.17* 1.08*  --  1.17*  --  1.05*  CALCIUM 7.5* 7.2*  --  7.5*  --  7.3*  MG  --   --  1.5*  --   --   --    GFR: Estimated Creatinine Clearance: 37.7 mL/min (A) (by C-G formula based on SCr of 1.05 mg/dL (H)). Liver Function Tests: Recent Labs  Lab 02/09/23 1839  AST 24  ALT 10  ALKPHOS 106  BILITOT 0.7  PROT 5.5*  ALBUMIN 2.0*   No results for input(s): "LIPASE", "AMYLASE" in the last 168 hours. No results for input(s): "AMMONIA" in the last 168 hours. Coagulation Profile: No results for input(s): "INR", "PROTIME" in the last 168 hours. Cardiac Enzymes: No results for input(s): "CKTOTAL", "CKMB", "CKMBINDEX", "TROPONINI" in the last 168 hours. BNP (last 3 results) No results for input(s): "PROBNP" in the last 8760 hours. HbA1C: No results for input(s): "HGBA1C" in the last 72 hours. CBG: No results for input(s): "GLUCAP" in the last 168 hours. Lipid Profile: No results for input(s): "CHOL", "HDL", "LDLCALC", "TRIG", "CHOLHDL", "LDLDIRECT" in the last 72 hours. Thyroid Function Tests: No results for input(s): "TSH", "T4TOTAL", "FREET4", "T3FREE", "THYROIDAB" in the last 72 hours. Anemia Panel: No results for input(s): "VITAMINB12", "FOLATE", "FERRITIN", "TIBC", "IRON", "RETICCTPCT" in the last 72 hours. Most Recent Urinalysis On File:     Component Value Date/Time   COLORURINE YELLOW (A) 02/10/2023 0006   APPEARANCEUR CLOUDY (A) 02/10/2023 0006   LABSPEC 1.012 02/10/2023 0006   PHURINE 5.0 02/10/2023 0006   GLUCOSEU NEGATIVE 02/10/2023 0006   HGBUR SMALL (A) 02/10/2023 0006   BILIRUBINUR  NEGATIVE 02/10/2023 0006   KETONESUR 5 (A) 02/10/2023 0006   PROTEINUR NEGATIVE 02/10/2023  0006   NITRITE NEGATIVE 02/10/2023 0006   LEUKOCYTESUR LARGE (A) 02/10/2023 0006   Sepsis Labs: @LABRCNTIP (procalcitonin:4,lacticidven:4) Microbiology: Recent Results (from the past 240 hour(s))  Blood culture (single)     Status: None (Preliminary result)   Collection Time: 02/09/23  6:39 PM   Specimen: BLOOD  Result Value Ref Range Status   Specimen Description BLOOD BLOOD RIGHT ARM  Final   Special Requests   Final    BOTTLES DRAWN AEROBIC AND ANAEROBIC Blood Culture results may not be optimal due to an excessive volume of blood received in culture bottles   Culture   Final    NO GROWTH 3 DAYS Performed at [facility], [address] [place], [place] [address]    Report Status PENDING  Incomplete  C Difficile Quick Screen w PCR reflex     Status: Abnormal   Collection Time: 02/09/23  8:13 PM   Specimen: STOOL  Result Value Ref Range Status   C Diff antigen POSITIVE (A) NEGATIVE Final   C Diff toxin NEGATIVE NEGATIVE Final   C Diff interpretation Results are indeterminate. See PCR results.  Final    Comment: Performed at [facility], [address] [place], [place] [address]  C. Diff by PCR, Reflexed     Status: Abnormal   Collection Time: 02/09/23  8:13 PM  Result Value Ref Range Status   Toxigenic C. Difficile by PCR POSITIVE (A) NEGATIVE Final    Comment: Positive for toxigenic C. difficile with little to no toxin production. Only treat if clinical presentation suggests symptomatic illness. Performed at [facility], [address] [place], [place] [address]   Blood culture (single)     Status: None (Preliminary result)   Collection Time: 02/10/23 12:06 AM   Specimen: BLOOD  Result Value Ref Range Status   Specimen Description BLOOD  LEFT FOREARM  Final   Special Requests   Final    BOTTLES DRAWN AEROBIC AND ANAEROBIC Blood Culture results may  not be optimal due to an inadequate volume of blood received in culture bottles   Culture   Final    NO GROWTH 2 DAYS Performed at [facility], [address], [place], [place] [address]    Report Status PENDING  Incomplete  Urine Culture     Status: Abnormal   Collection Time: 02/10/23 12:06 AM   Specimen: In/Out Cath Urine  Result Value Ref Range Status   Specimen Description   Final    IN/OUT CATH URINE Performed at [facility], [address], [place], [place] [address]    Special Requests   Final    NONE Performed at [facility], [address] [place], [place] [address]    Culture (A)  Final    >=100,000 COLONIES/mL ENTEROCOCCUS FAECALIS >=100,000 COLONIES/mL PSEUDOMONAS AERUGINOSA    Report Status 02/12/2023 FINAL  Final   Organism ID, Bacteria ENTEROCOCCUS FAECALIS (A)  Final   Organism ID, Bacteria PSEUDOMONAS AERUGINOSA (A)  Final      Susceptibility   Enterococcus faecalis - MIC*    AMPICILLIN <=2 SENSITIVE Sensitive     NITROFURANTOIN <=16 SENSITIVE Sensitive     VANCOMYCIN 1 SENSITIVE Sensitive     * >=100,000 COLONIES/mL ENTEROCOCCUS FAECALIS   Pseudomonas aeruginosa - MIC*    CEFTAZIDIME >=64 RESISTANT Resistant     CIPROFLOXACIN <=0.25 SENSITIVE Sensitive     GENTAMICIN 4 SENSITIVE Sensitive     IMIPENEM >=16 RESISTANT Resistant     PIP/TAZO >=  128 RESISTANT Resistant     * >=100,000 COLONIES/mL PSEUDOMONAS AERUGINOSA  Culture, blood (Routine X 2) w Reflex to ID Panel     Status: None (Preliminary result)   Collection Time: 02/10/23  7:51 PM   Specimen: BLOOD  Result Value Ref Range Status   Specimen Description BLOOD BLOOD RIGHT HAND  Final   Special Requests   Final    BOTTLES DRAWN AEROBIC AND ANAEROBIC Blood Culture adequate volume   Culture   Final    NO GROWTH 2 DAYS Performed at [facility], [address], [place], [place] [address]    Report Status PENDING  Incomplete  Culture, blood (Routine X 2) w  Reflex to ID Panel     Status: None (Preliminary result)   Collection Time: 02/10/23  7:52 PM   Specimen: BLOOD  Result Value Ref Range Status   Specimen Description BLOOD BLOOD RIGHT FOREARM  Final   Special Requests   Final    BOTTLES DRAWN AEROBIC AND ANAEROBIC Blood Culture adequate volume   Culture   Final    NO GROWTH 2 DAYS Performed at [facility], [address], [place], [place] [address]    Report Status PENDING  Incomplete  MRSA Next Gen by PCR, Nasal     Status: None   Collection Time: 02/10/23 11:15 PM   Specimen: Nasal Mucosa; Nasal Swab  Result Value Ref Range Status   MRSA by PCR Next Gen NOT DETECTED NOT DETECTED Final    Comment: (NOTE) The GeneXpert MRSA Assay (FDA approved for NASAL specimens only), is one component of a comprehensive MRSA colonization surveillance program. It is not intended to diagnose MRSA infection nor to guide or monitor treatment for MRSA infections. Test performance is not FDA approved in patients less than 83 years old. Performed at [facility] [address], [place], [place] [address]       Radiology Studies last 3 days: [facility] 1 View  Result Date: 02/10/2023 CLINICAL DATA:  Weakness EXAM: PORTABLE CHEST 1 VIEW COMPARISON:  Chest radiograph 11/02/2018 FINDINGS: Stable cardiomediastinal silhouette. Low lung volumes accentuate pulmonary vascularity. Chronic bilateral bronchitic changes. No focal consolidation, pleural effusion, or pneumothorax. Left basilar calcified granuloma. Remote right rib fractures. Advanced arthritis left shoulder. IMPRESSION: Low lung volumes with chronic bronchitic changes. Electronically Signed   By: Minerva Fester M.D.   On: 02/10/2023 01:03             LOS: 2 days    Sunnie Nielsen, DO [facility] 02/12/2023, 1:49 PM    Dictation software may have been used to generate the above note. Typos may occur and escape review in typed/dictated notes. Please  contact Dr Lyn Hollingshead directly for clarity if needed.  Staff may message me via secure chat in Epic  but this may not receive an immediate response,  please page me for urgent matters!  If 7PM-7AM, please contact night coverage [email or web address]

## 2023-02-12 NOTE — [place] FL2 (Signed)
[place] MEDICAID FL2 LEVEL OF CARE FORM     IDENTIFICATION  Patient Name: Mikayla [name] Birthdate: 22-Sep-1941 Sex: female Admission Date (Current Location): 02/09/2023  [facility] and [place] Number:  Chiropodist and Address:         Provider Number: [id number]  Attending Physician Name and Address:  Sunnie Nielsen, DO  Relative Name and Phone Number:       Current Level of Care: Hospital Recommended Level of Care: Nursing Facility Prior Approval Number:    Date Approved/Denied:   PASRR Number: [id number]  Discharge Plan: SNF    Current Diagnoses: Patient Active Problem List   Diagnosis Date Noted   Functional quadriplegia 02/10/2023   C. difficile diarrhea 02/10/2023   Sacral decubitus ulcer 02/10/2023   COPD (chronic obstructive pulmonary disease) 02/10/2023   Chronic respiratory failure with hypoxia 02/10/2023   Hypokalemia 02/10/2023   Stage 3a chronic kidney disease 02/10/2023   Urinary tract infection 02/10/2023   Leukocytosis 02/10/2023   Candidal intertrigo 02/10/2023   C. difficile colitis 02/10/2023   Parkinson's disease 01/17/2021   Occasional tremors 01/17/2021   General body deterioration 12/02/2018   Shortness of breath 12/02/2018   HTN (hypertension) 11/02/2018   PAF (paroxysmal atrial fibrillation) 11/02/2018   Renal hemorrhage, right 11/02/2018   Acute cholangitis    Calculus of bile duct with acute cholangitis with obstruction    Severe sepsis 08/19/2018   Acute respiratory failure 08/10/2018   Calculus of bile duct without cholangitis or cholecystitis with obstruction    Cellulitis 11/20/2016   Pressure injury of skin 09/30/2016   Palliative care encounter    Goals of care, counseling/discussion    Encounter for hospice care discussion    Contracture of muscle of hand 07/06/2016    Orientation RESPIRATION BLADDER Height & Weight     Self  O2 (3L [place]) Indwelling catheter Weight: 65.8 kg Height:   (160 cm)   BEHAVIORAL SYMPTOMS/MOOD NEUROLOGICAL BOWEL NUTRITION STATUS      Incontinent (Rectal tube to be removed prior to discharge) Diet (Heart healthy)  AMBULATORY STATUS COMMUNICATION OF NEEDS Skin   Total Care Verbally PU Stage and Appropriate Care                       Personal Care Assistance Level of Assistance              Functional Limitations Info             SPECIAL CARE FACTORS FREQUENCY                       Contractures Contractures Info: Not present    Additional Factors Info  Code Status, Allergies Code Status Info: DNR Allergies Info: Levaquin (Levofloxacin), Other, Sulfa Antibiotics, Oxycodone           Current Medications (02/12/2023):  This is the current hospital active medication list Current Facility-Administered Medications  Medication Dose Route Frequency Provider Last Rate Last Admin   0.9 %  sodium chloride infusion   Intravenous Continuous Sunnie Nielsen, DO 100 mL/hr at 02/12/23 0616 New Bag at 02/12/23 0616   acetaminophen (TYLENOL) tablet 650 mg  650 mg Oral Q6H PRN Andris Baumann, MD   650 mg at 02/11/23 1807   Or   acetaminophen (TYLENOL) suppository 650 mg  650 mg Rectal Q6H PRN Andris Baumann, MD       amiodarone (PACERONE) tablet 200 mg  200 mg  Oral Daily Sunnie Nielsen, DO   200 mg at 02/11/23 8295   carbidopa-levodopa (SINEMET IR) 25-100 MG per tablet immediate release 1 tablet  1 tablet Oral TID Sunnie Nielsen, DO   1 tablet at 02/11/23 2140   Chlorhexidine Gluconate Cloth 2 % PADS 6 each  6 each Topical Daily Sunnie Nielsen, DO   6 each at 02/11/23 0853   enoxaparin (LOVENOX) injection 40 mg  40 mg Subcutaneous Q24H Andris Baumann, MD   40 mg at 02/11/23 6213   ferrous sulfate tablet 325 mg  325 mg Oral Daily Sunnie Nielsen, DO   325 mg at 02/11/23 0865   guaiFENesin (MUCINEX) 12 hr tablet 600 mg  600 mg Oral BID PRN Sunnie Nielsen, DO       HYDROcodone-acetaminophen (NORCO/VICODIN) 5-325 MG per  tablet 1-2 tablet  1-2 tablet Oral Q4H PRN Andris Baumann, MD       ipratropium-albuterol (DUONEB) 0.5-2.5 (3) MG/3ML nebulizer solution 3 mL  3 mL Nebulization Q6H PRN Andris Baumann, MD       morphine (PF) 2 MG/ML injection 2 mg  2 mg Intravenous Q2H PRN Andris Baumann, MD       multivitamin with minerals tablet 1 tablet  1 tablet Oral Daily Sunnie Nielsen, DO   1 tablet at 02/11/23 7846   nystatin ointment (MYCOSTATIN)   Topical Once Irean Hong, MD       olopatadine (PATANOL) 0.1 % ophthalmic solution 1 drop  1 drop Both Eyes Daily Sunnie Nielsen, DO   1 drop at 02/11/23 1145   ondansetron (ZOFRAN) tablet 4 mg  4 mg Oral Q6H PRN Andris Baumann, MD       Or   ondansetron [facility]) injection 4 mg  4 mg Intravenous Q6H PRN Andris Baumann, MD       pantoprazole (PROTONIX) EC tablet 40 mg  40 mg Oral BID Andris Baumann, MD   40 mg at 02/11/23 2141   PARoxetine (PAXIL) tablet 30 mg  30 mg Oral Daily Lindajo Royal V, MD   30 mg at 02/11/23 0854   sodium chloride flush (NS) 0.9 % injection 10-40 mL  10-40 mL Intracatheter Q12H Sunnie Nielsen, DO   10 mL at 02/11/23 2141   vancomycin (VANCOCIN) capsule 125 mg  125 mg Oral QID Irean Hong, MD   125 mg at 02/11/23 2141   zinc oxide 20 % ointment 1 Application  1 Application Topical Q8H Andris Baumann, MD   1 Application at 02/12/23 0544     Discharge Medications: Please see discharge summary for a list of discharge medications.  Relevant Imaging Results:  Relevant Lab Results:   Additional Information SSN: [id number]  Chapman Fitch, RN

## 2023-02-12 NOTE — TOC Initial Note (Signed)
Transition of Care [facility]) - Initial/Assessment Note    Patient Details  Name: Mikayla [name] MRN: [id number] Date of Birth: 07-22-1941  Transition of Care [facility]) CM/SW Contact:    Chapman Fitch, RN Phone Number: 02/12/2023, 1:23 PM  Clinical Narrative:                  Patient alert to self Met with daughters [name] and [name]  Patient is from [facility]  Daughters confirm plan is to return at discharge Fl2 sent for signature Per Stanton Kidney at [facility] they can accept patients over the weekend.  Signed DNR on chart   Expected Discharge Plan: Long Term Nursing Home Barriers to Discharge: Continued Medical Work up   Patient Goals and CMS Choice            Expected Discharge Plan and Services       Living arrangements for the past 2 months: Skilled Nursing Facility                                      Prior Living Arrangements/Services Living arrangements for the past 2 months: Skilled Nursing Facility                     Activities of Daily Living Home Assistive Devices/Equipment: Hospital bed ADL Screening (condition at time of admission) Patient's cognitive ability adequate to safely complete daily activities?: Yes Is the patient deaf or have difficulty hearing?: No Does the patient have difficulty seeing, even when wearing glasses/contacts?: No Does the patient have difficulty concentrating, remembering, or making decisions?: No Patient able to express need for assistance with ADLs?: Yes Does the patient have difficulty dressing or bathing?: Yes Independently performs ADLs?: No Communication: Independent Dressing (OT): Dependent Is this a change from baseline?: Pre-admission baseline Grooming: Dependent Is this a change from baseline?: Pre-admission baseline Feeding: Dependent Is this a change from baseline?: Pre-admission baseline Bathing: Dependent Is this a change from baseline?: Pre-admission baseline Toileting: Dependent Is  this a change from baseline?: Pre-admission baseline In/Out Bed: Dependent Is this a change from baseline?: Pre-admission baseline Walks in Home: Dependent Is this a change from baseline?: Pre-admission baseline Does the patient have difficulty walking or climbing stairs?: Yes Weakness of Legs: Both Weakness of Arms/Hands: Both  Permission Sought/Granted                  Emotional Assessment              Admission diagnosis:  Dehydration [E86.0] Hypokalemia [E87.6] Weakness generalized [R53.1] Candida infection [B37.9] C. difficile diarrhea [A04.72] C. difficile colitis [A04.72] Urinary tract infection without hematuria, site unspecified [N39.0] Pressure injury of skin of sacral region, unspecified injury stage [L89.159] Sepsis, due to unspecified organism, unspecified whether acute organ dysfunction present [A41.9] Patient Active Problem List   Diagnosis Date Noted   Functional quadriplegia 02/10/2023   C. difficile diarrhea 02/10/2023   Sacral decubitus ulcer 02/10/2023   COPD (chronic obstructive pulmonary disease) 02/10/2023   Chronic respiratory failure with hypoxia 02/10/2023   Hypokalemia 02/10/2023   Stage 3a chronic kidney disease 02/10/2023   Urinary tract infection 02/10/2023   Leukocytosis 02/10/2023   Candidal intertrigo 02/10/2023   C. difficile colitis 02/10/2023   Parkinson's disease 01/17/2021   Occasional tremors 01/17/2021   General body deterioration 12/02/2018   Shortness of breath 12/02/2018   HTN (hypertension) 11/02/2018  PAF (paroxysmal atrial fibrillation) 11/02/2018   Renal hemorrhage, right 11/02/2018   Acute cholangitis    Calculus of bile duct with acute cholangitis with obstruction    Severe sepsis 08/19/2018   Acute respiratory failure 08/10/2018   Calculus of bile duct without cholangitis or cholecystitis with obstruction    Cellulitis 11/20/2016   Pressure injury of skin 09/30/2016   Palliative care encounter    Goals of  care, counseling/discussion    Encounter for hospice care discussion    Contracture of muscle of hand 07/06/2016   PCP:  Clovis Cao, MD Pharmacy:   [facility] [phone] Dan Humphreys, [place] - [address] AT [facility] [address] [place] [address] Phone: [phone] Fax: [phone]     Social Determinants of Health (SDOH) Social History: SDOH Screenings   Food Insecurity: No Food Insecurity (02/10/2023)  Housing: Low Risk  (02/10/2023)  Transportation Needs: No Transportation Needs (02/10/2023)  Utilities: Not At Risk (02/10/2023)  Tobacco Use: Medium Risk (02/09/2023)   SDOH Interventions:     Readmission Risk Interventions     No data to display

## 2023-02-13 DIAGNOSIS — I10 Essential (primary) hypertension: Secondary | ICD-10-CM | POA: Diagnosis not present

## 2023-02-13 DIAGNOSIS — D72829 Elevated white blood cell count, unspecified: Secondary | ICD-10-CM | POA: Diagnosis not present

## 2023-02-13 DIAGNOSIS — J42 Unspecified chronic bronchitis: Secondary | ICD-10-CM | POA: Diagnosis not present

## 2023-02-13 DIAGNOSIS — A0472 Enterocolitis due to Clostridium difficile, not specified as recurrent: Secondary | ICD-10-CM | POA: Diagnosis not present

## 2023-02-13 LAB — CULTURE, BLOOD (ROUTINE X 2)
Culture: NO GROWTH
Special Requests: ADEQUATE

## 2023-02-13 NOTE — Progress Notes (Signed)
PROGRESS NOTE    Mikayla [name]   ZOX:[id number] DOB: September 30, 1941  DOA: 02/09/2023 Date of Service: 02/13/23 PCP: Clovis Cao, MD     Brief Narrative / Hospital Course:  Mikayla [name] is a 82 y.o. female with medical history significant for Parkinson's disease, COPD on O2 at 3 L,, A-fib, hypertension, with remote history of a 77-month hospitalization in 2017 for empyema s/p right lung decortication SNF resident since and  total care dependent requiring assistance for all ADLs, with chronic sacral wound, currently undergoing IV treatment via PICC line (present for weeks)   for sepsis secondary to UTI x 2, who was sent to the ED with a concern for continued infection/elevated WBC in spite of antibiotic treatment.  She has had no fever or chills, but she was noted to have a diaper rash and soft stool in her diaper.  04/16: ED course and data review: BP 167/95 with otherwise normal vitals Labs significant for WBC of 16,000 with normal lactic acid and a Pro-Calc of 17, hemoglobin 10.4 which appears to be chronic. Potassium is 2.5, creatinine 1.17 at baseline. C. difficile antigen positive. Patient was given potassium supplementation and hospitalist consulted for admission. Leukocytosis d/t C. difficile versus UTI versus bacteremia given prolonged PICC x weeks. Treating CDiff. Holding Invanz abx pending UA/UCx. 04/17: UA abnormal, will start Abx for UTI given illness severity and UTI risk factors. Await cultures. Spoke w/ daughter in evening who reports patient is looking more like her baseline.  04/18: low K down to 2.5. Awaiting cultures. Restarting fluids d/t soft BP. D/c abx for UTI - likely colonized and no symptoms at this time. Continuing Foley d/t incontinence and skin breakdown. Flexiseal placed.  04/19: BCx NGx2d, UCx pending identification and susceptibilities. Stool still loose/liquid, maintain flexiseal for now.  04/20: still fairly substantial liquid stool output.    Consultants:  none  Procedures: none      ASSESSMENT & PLAN:   Principal Problem:   Leukocytosis Active Problems:   C. difficile diarrhea   Hypokalemia   HTN (hypertension)   PAF (paroxysmal atrial fibrillation)   Parkinson's disease   Functional quadriplegia   Sacral decubitus ulcer   COPD (chronic obstructive pulmonary disease)   Chronic respiratory failure with hypoxia   Stage 3a chronic kidney disease   Candidal intertrigo   C. difficile colitis   Asymptomatic bacteriuria - enterococcus faecalis, pseudomonas aeruginosa   Pseudomonas aeruginosa colonization   Leukocytosis Possibly related to C. difficile versus recent UTI versus bacteremia given prolonged PICC x weeks Treated for C. difficile as outlined under respective problem D/c abx for UTI - likely colonized and no symptoms at this time or prior to hospital arrival Removed PICC  C. difficile diarrhea Oral vancomycin Enteric precautions maintain flexiseal for now  Hypokalemia - resolved Suspect secondary to diarrhea IV and oral repletion and monitor Checking Mg  PAF (paroxysmal atrial fibrillation) Continue amiodarone Holding carvedilol d/t lower BP  Parkinson's disease Continue gabapentin  HTN (hypertension) Holding home carvedilol d/t low BP Holding Lasix given hypokalemia  Functional quadriplegia d/t CVA sequelae  Sacral decubitus ulcer Supportive care and decubitus precautions Continuing Foley d/t incontinence and skin breakdown  COPD (chronic obstructive pulmonary disease) Chronic respiratory failure with hypoxia Not acutely exacerbated Continue home inhalers with DuoNebs as needed Continue supplemental oxygen and titrate as needed to keep sats over 90 to 92%.  Stage 3a chronic kidney disease Renal function at baseline Monitor BMP  Asymptomatic Bacteriuria: Enterococcus and Pseudomonas Maintain contact precautions  Candidal intertrigo Nystatin cream and barrier  precautions. Diflucan po 150 mg x1    DVT prophylaxis: lovenox  Pertinent IV fluids/nutrition: no continuous IV fluids  Central lines / invasive devices: Foley catheter inserted 02/10/23, Flexiseal rectal tube inserted 04/18  Code Status: DNR ACP documents reviewed 02/10/23 and confirmed verbally w/ daughter [name]    Current Admission Status: inpatient   TOC needs / Dispo plan: anticipate back to [facility] likely tomorrow  Barriers to discharge / significant pending items: clinical improvement in diarrhea hopefully discharge tomorrow (TOC has confirmed can accept her on weekend)             Subjective / Brief ROS:  Patient reports no concerns  RN reports still fair amount of liquid stool per rectal tube  Denies urinary urgency/frequency prior to hospital arrival  Denies CP/SOB.  Pain controlled.     Family Communication: both daughters at bedside today on rounds     Objective Findings:  Vitals:   02/12/23 0755 02/12/23 1955 02/13/23 0514 02/13/23 0736  BP:  109/79 (!) 141/69 (!) 139/59  Pulse:  82 97 84  Resp:  18 18 20   Temp:  98.6 F (37 C) 97.7 F (36.5 C) 98.2 F (36.8 C)  TempSrc:  Oral Oral   SpO2: 100% 100% 100%   Weight:      Height:        Intake/Output Summary (Last 24 hours) at 02/13/2023 1311 Last data filed at 02/13/2023 0500 Gross per 24 hour  Intake 2387.16 ml  Output 850 ml  Net 1537.16 ml   Filed Weights   02/09/23 1835  Weight: 65.8 kg    Examination:  Physical Exam Constitutional:      General: She is not in acute distress.    Appearance: She is obese.  Cardiovascular:     Rate and Rhythm: Normal rate and regular rhythm.  Pulmonary:     Effort: Pulmonary effort is normal.     Breath sounds: Normal breath sounds.  Abdominal:     General: Abdomen is flat.     Palpations: Abdomen is soft.  Skin:    General: Skin is warm and dry.     Findings: Rash (c/w vaginal candidia / diaper rash irritation) present.   Neurological:     General: No focal deficit present.     Mental Status: She is alert. Mental status is at baseline.     Motor: Tremor present.  Psychiatric:        Mood and Affect: Mood normal.        Behavior: Behavior normal.          Scheduled Medications:   amiodarone  200 mg Oral Daily   vitamin C  500 mg Oral BID   carbidopa-levodopa  1 tablet Oral TID   Chlorhexidine Gluconate Cloth  6 each Topical Daily   enoxaparin (LOVENOX) injection  40 mg Subcutaneous Q24H   feeding supplement  237 mL Oral TID BM   ferrous sulfate  325 mg Oral Daily   multivitamin with minerals  1 tablet Oral Daily   nystatin ointment   Topical Once   olopatadine  1 drop Both Eyes Daily   pantoprazole  40 mg Oral BID   PARoxetine  30 mg Oral Daily   sodium chloride flush  10-40 mL Intracatheter Q12H   vancomycin  125 mg Oral QID   zinc oxide  1 Application Topical Q8H    Continuous Infusions:  sodium chloride 100 mL/hr at  02/13/23 0515    PRN Medications:  acetaminophen **OR** acetaminophen, guaiFENesin, HYDROcodone-acetaminophen, ipratropium-albuterol, morphine injection, ondansetron **OR** ondansetron (ZOFRAN) IV  Antimicrobials from admission:  Anti-infectives (From admission, onward)    Start     Dose/Rate Route Frequency Ordered Stop   02/10/23 2000  cefTRIAXone (ROCEPHIN) 1 g in sodium chloride 0.9 % 100 mL IVPB  Status:  Discontinued        1 g 200 mL/hr over 30 Minutes Intravenous Every 24 hours 02/10/23 1547 02/11/23 1131   02/10/23 1600  fluconazole (DIFLUCAN) tablet 150 mg        150 mg Oral  Once 02/10/23 1552 02/10/23 2024   02/10/23 1000  vancomycin (VANCOCIN) capsule 125 mg  Status:  Discontinued        125 mg Oral 4 times daily 02/10/23 0121 02/10/23 0127   02/10/23 0100  vancomycin (VANCOCIN) capsule 125 mg        125 mg Oral 4 times daily 02/09/23 2346 02/19/23 2159           Data Reviewed:  I have personally reviewed the following...  CBC: Recent Labs   Lab 02/09/23 1839 02/10/23 0457 02/11/23 0628 02/12/23 0628  WBC 15.9* 12.5* 10.3 12.1*  NEUTROABS 13.0*  --   --   --   HGB 10.4* 8.1* 9.0* 8.7*  HCT 34.2* 26.5* 28.8* 28.7*  MCV 91.4 91.1 90.0 90.8  PLT 250 331 345 351   Basic Metabolic Panel: Recent Labs  Lab 02/09/23 1839 02/10/23 0457 02/11/23 0620 02/11/23 0628 02/11/23 1341 02/12/23 0628  NA 137 138  --  140  --  138  K 2.5* 3.0*  --  2.5* 3.2* 3.8  CL 91* 95*  --  96*  --  101  CO2 33* 32  --  35*  --  29  GLUCOSE 125* 81  --  80  --  72  BUN 14 13  --  14  --  11  CREATININE 1.17* 1.08*  --  1.17*  --  1.05*  CALCIUM 7.5* 7.2*  --  7.5*  --  7.3*  MG  --   --  1.5*  --   --   --    GFR: Estimated Creatinine Clearance: 37.7 mL/min (A) (by C-G formula based on SCr of 1.05 mg/dL (H)). Liver Function Tests: Recent Labs  Lab 02/09/23 1839  AST 24  ALT 10  ALKPHOS 106  BILITOT 0.7  PROT 5.5*  ALBUMIN 2.0*   No results for input(s): "LIPASE", "AMYLASE" in the last 168 hours. No results for input(s): "AMMONIA" in the last 168 hours. Coagulation Profile: No results for input(s): "INR", "PROTIME" in the last 168 hours. Cardiac Enzymes: No results for input(s): "CKTOTAL", "CKMB", "CKMBINDEX", "TROPONINI" in the last 168 hours. BNP (last 3 results) No results for input(s): "PROBNP" in the last 8760 hours. HbA1C: No results for input(s): "HGBA1C" in the last 72 hours. CBG: No results for input(s): "GLUCAP" in the last 168 hours. Lipid Profile: No results for input(s): "CHOL", "HDL", "LDLCALC", "TRIG", "CHOLHDL", "LDLDIRECT" in the last 72 hours. Thyroid Function Tests: No results for input(s): "TSH", "T4TOTAL", "FREET4", "T3FREE", "THYROIDAB" in the last 72 hours. Anemia Panel: No results for input(s): "VITAMINB12", "FOLATE", "FERRITIN", "TIBC", "IRON", "RETICCTPCT" in the last 72 hours. Most Recent Urinalysis On File:     Component Value Date/Time   COLORURINE YELLOW (A) 02/10/2023 0006   APPEARANCEUR  CLOUDY (A) 02/10/2023 0006   LABSPEC 1.012 02/10/2023 0006   PHURINE 5.0  02/10/2023 0006   GLUCOSEU NEGATIVE 02/10/2023 0006   HGBUR SMALL (A) 02/10/2023 0006   BILIRUBINUR NEGATIVE 02/10/2023 0006   KETONESUR 5 (A) 02/10/2023 0006   PROTEINUR NEGATIVE 02/10/2023 0006   NITRITE NEGATIVE 02/10/2023 0006   LEUKOCYTESUR LARGE (A) 02/10/2023 0006   Sepsis Labs: (procalcitonin:4,lacticidven:4) Microbiology: Recent Results (from the past 240 hour(s))  Blood culture (single)     Status: None (Preliminary result)   Collection Time: 02/09/23  6:39 PM   Specimen: BLOOD  Result Value Ref Range Status   Specimen Description BLOOD BLOOD RIGHT ARM  Final   Special Requests   Final    BOTTLES DRAWN AEROBIC AND ANAEROBIC Blood Culture results may not be optimal due to an excessive volume of blood received in culture bottles   Culture   Final    NO GROWTH 4 DAYS Performed at [facility], [address], [place], [place] [address]    Report Status PENDING  Incomplete  C Difficile Quick Screen w PCR reflex     Status: Abnormal   Collection Time: 02/09/23  8:13 PM   Specimen: STOOL  Result Value Ref Range Status   C Diff antigen POSITIVE (A) NEGATIVE Final   C Diff toxin NEGATIVE NEGATIVE Final   C Diff interpretation Results are indeterminate. See PCR results.  Final    Comment: Performed at [facility], [address] [place], [place] [address]  C. Diff by PCR, Reflexed     Status: Abnormal   Collection Time: 02/09/23  8:13 PM  Result Value Ref Range Status   Toxigenic C. Difficile by PCR POSITIVE (A) NEGATIVE Final    Comment: Positive for toxigenic C. difficile with little to no toxin production. Only treat if clinical presentation suggests symptomatic illness. Performed at [facility], [address] [place], [place] [address]   Blood culture (single)     Status: None (Preliminary result)   Collection Time: 02/10/23 12:06 AM   Specimen: BLOOD   Result Value Ref Range Status   Specimen Description BLOOD  LEFT FOREARM  Final   Special Requests   Final    BOTTLES DRAWN AEROBIC AND ANAEROBIC Blood Culture results may not be optimal due to an inadequate volume of blood received in culture bottles   Culture   Final    NO GROWTH 3 DAYS Performed at [facility], [address], [place], [place] [address]    Report Status PENDING  Incomplete  Urine Culture     Status: Abnormal   Collection Time: 02/10/23 12:06 AM   Specimen: In/Out Cath Urine  Result Value Ref Range Status   Specimen Description   Final    IN/OUT CATH URINE Performed at [facility], [address], [place], [place] [address]    Special Requests   Final    NONE Performed at [facility], [address] [place], [place] [address]    Culture (A)  Final    >=100,000 COLONIES/mL ENTEROCOCCUS FAECALIS >=100,000 COLONIES/mL PSEUDOMONAS AERUGINOSA    Report Status 02/12/2023 FINAL  Final   Organism ID, Bacteria ENTEROCOCCUS FAECALIS (A)  Final   Organism ID, Bacteria PSEUDOMONAS AERUGINOSA (A)  Final      Susceptibility   Enterococcus faecalis - MIC*    AMPICILLIN <=2 SENSITIVE Sensitive     NITROFURANTOIN <=16 SENSITIVE Sensitive     VANCOMYCIN 1 SENSITIVE Sensitive     * >=100,000 COLONIES/mL ENTEROCOCCUS FAECALIS   Pseudomonas aeruginosa - MIC*  CEFTAZIDIME >=64 RESISTANT Resistant     CIPROFLOXACIN <=0.25 SENSITIVE Sensitive     GENTAMICIN 4 SENSITIVE Sensitive     IMIPENEM >=16 RESISTANT Resistant     PIP/TAZO >=128 RESISTANT Resistant     * >=100,000 COLONIES/mL PSEUDOMONAS AERUGINOSA  Culture, blood (Routine X 2) w Reflex to ID Panel     Status: None (Preliminary result)   Collection Time: 02/10/23  7:51 PM   Specimen: BLOOD  Result Value Ref Range Status   Specimen Description BLOOD BLOOD RIGHT HAND  Final   Special Requests   Final    BOTTLES DRAWN AEROBIC AND ANAEROBIC Blood Culture adequate volume   Culture    Final    NO GROWTH 3 DAYS Performed at [facility], [address], [place], [place] [address]    Report Status PENDING  Incomplete  Culture, blood (Routine X 2) w Reflex to ID Panel     Status: None (Preliminary result)   Collection Time: 02/10/23  7:52 PM   Specimen: BLOOD  Result Value Ref Range Status   Specimen Description BLOOD BLOOD RIGHT FOREARM  Final   Special Requests   Final    BOTTLES DRAWN AEROBIC AND ANAEROBIC Blood Culture adequate volume   Culture   Final    NO GROWTH 3 DAYS Performed at [facility], [address] [place], [place] [address]    Report Status PENDING  Incomplete  MRSA Next Gen by PCR, Nasal     Status: None   Collection Time: 02/10/23 11:15 PM   Specimen: Nasal Mucosa; Nasal Swab  Result Value Ref Range Status   MRSA by PCR Next Gen NOT DETECTED NOT DETECTED Final    Comment: (NOTE) The GeneXpert MRSA Assay (FDA approved for NASAL specimens only), is one component of a comprehensive MRSA colonization surveillance program. It is not intended to diagnose MRSA infection nor to guide or monitor treatment for MRSA infections. Test performance is not FDA approved in patients less than 32 years old. Performed at [facility], [address], [place], [place] [address]       Radiology Studies last 3 days: [facility] Chest [facility] 1 View  Result Date: 02/10/2023 CLINICAL DATA:  Weakness EXAM: PORTABLE CHEST 1 VIEW COMPARISON:  Chest radiograph 11/02/2018 FINDINGS: Stable cardiomediastinal silhouette. Low lung volumes accentuate pulmonary vascularity. Chronic bilateral bronchitic changes. No focal consolidation, pleural effusion, or pneumothorax. Left basilar calcified granuloma. Remote right rib fractures. Advanced arthritis left shoulder. IMPRESSION: Low lung volumes with chronic bronchitic changes. Electronically Signed   By: Minerva Fester M.D.   On: 02/10/2023 01:03             LOS: 3 days    Sunnie Nielsen,  DO [facility] 02/13/2023, 1:11 PM    Dictation software may have been used to generate the above note. Typos may occur and escape review in typed/dictated notes. Please contact Dr Lyn Hollingshead directly for clarity if needed.  Staff may message me via secure chat in Epic  but this may not receive an immediate response,  please page me for urgent matters!  If 7PM-7AM, please contact night coverage [email or web address]

## 2023-02-14 DIAGNOSIS — J42 Unspecified chronic bronchitis: Secondary | ICD-10-CM | POA: Diagnosis not present

## 2023-02-14 DIAGNOSIS — A0472 Enterocolitis due to Clostridium difficile, not specified as recurrent: Secondary | ICD-10-CM | POA: Diagnosis not present

## 2023-02-14 DIAGNOSIS — I10 Essential (primary) hypertension: Secondary | ICD-10-CM | POA: Diagnosis not present

## 2023-02-14 DIAGNOSIS — D72829 Elevated white blood cell count, unspecified: Secondary | ICD-10-CM | POA: Diagnosis not present

## 2023-02-14 LAB — CBC
HCT: 27.1 % — ABNORMAL LOW (ref 36.0–46.0)
Hemoglobin: 8.3 g/dL — ABNORMAL LOW (ref 12.0–15.0)
MCH: 28.3 pg (ref 26.0–34.0)
MCHC: 30.6 g/dL (ref 30.0–36.0)
MCV: 92.5 fL (ref 80.0–100.0)
Platelets: 303 10*3/uL (ref 150–400)
RBC: 2.93 MIL/uL — ABNORMAL LOW (ref 3.87–5.11)
RDW: 18.1 % — ABNORMAL HIGH (ref 11.5–15.5)
WBC: 9.5 10*3/uL (ref 4.0–10.5)
nRBC: 0 % (ref 0.0–0.2)

## 2023-02-14 LAB — BASIC METABOLIC PANEL
Anion gap: 7 (ref 5–15)
BUN: 7 mg/dL — ABNORMAL LOW (ref 8–23)
CO2: 27 mmol/L (ref 22–32)
Calcium: 7.5 mg/dL — ABNORMAL LOW (ref 8.9–10.3)
Chloride: 107 mmol/L (ref 98–111)
Creatinine, Ser: 0.95 mg/dL (ref 0.44–1.00)
GFR, Estimated: 60 mL/min — ABNORMAL LOW (ref >60)
Glucose, Bld: 76 mg/dL (ref 70–99)
Potassium: 3.6 mmol/L (ref 3.5–5.1)
Sodium: 141 mmol/L (ref 135–145)

## 2023-02-14 LAB — CULTURE, BLOOD (SINGLE): Culture: NO GROWTH

## 2023-02-14 LAB — CULTURE, BLOOD (ROUTINE X 2)

## 2023-02-14 NOTE — Progress Notes (Signed)
Flexiseal removed from the rectum per providers order. The patient tolerated the removal without any complication.

## 2023-02-14 NOTE — Progress Notes (Signed)
PROGRESS NOTE    Mikayla [name]   NFA:[id number] DOB: [birth year]  DOA: 02/09/2023 Date of Service: 02/14/23 PCP: Clovis Cao, MD     Brief Narrative / Hospital Course:  Mikayla [name] is a 82 y.o. female with medical history significant for Parkinson's disease, COPD on O2 at 3 L,, A-fib, hypertension, with remote history of a 46-month hospitalization in 2017 for empyema s/p right lung decortication SNF resident since and  total care dependent requiring assistance for all ADLs, with chronic sacral wound, currently undergoing IV treatment via PICC line (present for weeks)   for sepsis secondary to UTI x 2, who was sent to the ED with a concern for continued infection/elevated WBC in spite of antibiotic treatment.  She has had no fever or chills, but she was noted to have a diaper rash and soft stool in her diaper.  04/16: ED course and data review: BP 167/95 with otherwise normal vitals Labs significant for WBC of 16,000 with normal lactic acid and a Pro-Calc of 17, hemoglobin 10.4 which appears to be chronic. Potassium is 2.5, creatinine 1.17 at baseline. C. difficile antigen positive. Patient was given potassium supplementation and hospitalist consulted for admission. Leukocytosis d/t C. difficile versus UTI versus bacteremia given prolonged PICC x weeks. Treating CDiff. Holding Invanz abx pending UA/UCx. 04/17: UA abnormal, will start Abx for UTI given illness severity and UTI risk factors. Await cultures. Spoke w/ daughter in evening who reports patient is looking more like her baseline.  04/18: low K down to 2.5. Awaiting cultures. Restarting fluids d/t soft BP. D/c abx for UTI - likely colonized and no symptoms at this time. Continuing Foley d/t incontinence and skin breakdown. Flexiseal placed.  04/19: BCx NGx2d, UCx pending identification and susceptibilities. Stool still loose/liquid, maintain flexiseal for now.  04/20: still fairly substantial liquid stool output.  04/21: stool  output easing up, removing rectal tube and SNF should be able to take her back tomorrow, cannot today.   Consultants:  none  Procedures: none      ASSESSMENT & PLAN:   Principal Problem:   Leukocytosis Active Problems:   C. difficile diarrhea   Hypokalemia   HTN (hypertension)   PAF (paroxysmal atrial fibrillation)   Parkinson's disease   Functional quadriplegia   Sacral decubitus ulcer   COPD (chronic obstructive pulmonary disease)   Chronic respiratory failure with hypoxia   Stage 3a chronic kidney disease   Candidal intertrigo   C. difficile colitis   Asymptomatic bacteriuria - enterococcus faecalis, pseudomonas aeruginosa   Pseudomonas aeruginosa colonization   Leukocytosis - resolved Likely related to C. difficile versus recent UTI  Treated for C. difficile as outlined under respective problem D/c abx for UTI - likely colonized and no symptoms at this time or prior to hospital arrival Removed PICC  C. difficile diarrhea Oral vancomycin Enteric precautions D/c rectal tube today Frequent hygiene checks and peri-care     Hypokalemia - resolved Suspect secondary to diarrhea IV and oral repletion and monitor  Hypomagnesemia Replaced Follow outpatient   PAF (paroxysmal atrial fibrillation) Continue amiodarone Holding carvedilol d/t lower BP  Parkinson's disease Continue gabapentin  HTN (hypertension) Holding home carvedilol d/t low BP but BP is improving, restart possible outpatient  Holding Lasix given recent hypokalemia  Functional quadriplegia d/t CVA sequelae  Sacral decubitus ulcer Supportive care and decubitus precautions Continuing Foley d/t incontinence and skin breakdown  COPD (chronic obstructive pulmonary disease) Chronic respiratory failure with hypoxia Not acutely exacerbated Continue home inhalers with  DuoNebs as needed Continue supplemental oxygen and titrate as needed to keep sats over 90 to 92%.  Stage 3a chronic kidney  disease Renal function at baseline Monitor BMP  Asymptomatic Bacteriuria: Enterococcus and Pseudomonas Maintain contact precautions   Candidal intertrigo Nystatin cream and barrier precautions. Diflucan po 150 mg x1    DVT prophylaxis: lovenox  Pertinent IV fluids/nutrition: no continuous IV fluids  Central lines / invasive devices: Foley catheter inserted 02/10/23, Flexiseal rectal tube inserted 04/18  Code Status: DNR ACP documents reviewed 02/10/23 and confirmed verbally w/ daughter [name]    Current Admission Status: inpatient   TOC needs / Dispo plan: anticipate back to [facility] likely tomorrow  Barriers to discharge / significant pending items: clinical improvement in diarrhea hopefully discharge tomorrow (TOC has confirmed can accept her tomorrow but not today)             Subjective / Brief ROS:  Patient reports no concerns   Denies CP/SOB.     Family Communication: none at this time, RN in communication w/ daughters and will try to call them later today     Objective Findings:  Vitals:   02/13/23 0736 02/13/23 1614 02/13/23 1950 02/14/23 0510  BP: (!) 139/59 (!) 121/54 127/60 (!) 140/63  Pulse: 84 76 82 81  Resp: Temp: 98.2 F (36.8 C) 97.8 F (36.6 C) 99 F (37.2 C) 97.9 F (36.6 C)  TempSrc:   Oral   SpO2:  100% 98% 99%  Weight:      Height:        Intake/Output Summary (Last 24 hours) at 02/14/2023 1251 Last data filed at 02/14/2023 0510 Gross per 24 hour  Intake 2230.31 ml  Output 1000 ml  Net 1230.31 ml   Filed Weights   02/09/23 1835  Weight: 65.8 kg    Examination:  Physical Exam Constitutional:      General: She is not in acute distress.    Appearance: She is obese.  Cardiovascular:     Rate and Rhythm: Normal rate and regular rhythm.  Pulmonary:     Effort: Pulmonary effort is normal.     Breath sounds: Normal breath sounds.  Abdominal:     General: Abdomen is flat.     Palpations: Abdomen is  soft.  Skin:    General: Skin is warm and dry.  Neurological:     General: No focal deficit present.     Mental Status: She is alert. Mental status is at baseline.     Motor: Tremor present.  Psychiatric:        Mood and Affect: Mood normal.        Behavior: Behavior normal.          Scheduled Medications:   amiodarone  200 mg Oral Daily   vitamin C  500 mg Oral BID   carbidopa-levodopa  1 tablet Oral TID   Chlorhexidine Gluconate Cloth  6 each Topical Daily   enoxaparin (LOVENOX) injection  40 mg Subcutaneous Q24H   feeding supplement  237 mL Oral TID BM   ferrous sulfate  325 mg Oral Daily   multivitamin with minerals  1 tablet Oral Daily   nystatin ointment   Topical Once   olopatadine  1 drop Both Eyes Daily   pantoprazole  40 mg Oral BID   PARoxetine  30 mg Oral Daily   sodium chloride flush  10-40 mL Intracatheter Q12H   vancomycin  125 mg Oral QID  zinc oxide  1 Application Topical Q8H    Continuous Infusions:  sodium chloride 100 mL/hr at 02/14/23 0202    PRN Medications:  acetaminophen **OR** acetaminophen, guaiFENesin, HYDROcodone-acetaminophen, ipratropium-albuterol, morphine injection, ondansetron **OR** ondansetron (ZOFRAN) IV  Antimicrobials from admission:  Anti-infectives (From admission, onward)    Start     Dose/Rate Route Frequency Ordered Stop   02/10/23 2000  cefTRIAXone (ROCEPHIN) 1 g in sodium chloride 0.9 % 100 mL IVPB  Status:  Discontinued        1 g 200 mL/hr over 30 Minutes Intravenous Every 24 hours 02/10/23 1547 02/11/23 1131   02/10/23 1600  fluconazole (DIFLUCAN) tablet 150 mg        150 mg Oral  Once 02/10/23 1552 02/10/23 2024   02/10/23 1000  vancomycin (VANCOCIN) capsule 125 mg  Status:  Discontinued        125 mg Oral 4 times daily 02/10/23 0121 02/10/23 0127   02/10/23 0100  vancomycin (VANCOCIN) capsule 125 mg        125 mg Oral 4 times daily 02/09/23 2346 02/19/23 2159           Data Reviewed:  I have personally  reviewed the following...  CBC: Recent Labs  Lab 02/09/23 1839 02/10/23 0457 02/11/23 0628 02/12/23 0628 02/14/23 0503  WBC 15.9* 12.5* 10.3 12.1* 9.5  NEUTROABS 13.0*  --   --   --   --   HGB 10.4* 8.1* 9.0* 8.7* 8.3*  HCT 34.2* 26.5* 28.8* 28.7* 27.1*  MCV 91.4 91.1 90.0 90.8 92.5  PLT 250 331 345 351 303   Basic Metabolic Panel: Recent Labs  Lab 02/09/23 1839 02/10/23 0457 02/11/23 0620 02/11/23 0628 02/11/23 1341 02/12/23 0628 02/14/23 0503  NA 137 138  --  140  --  138 141  K 2.5* 3.0*  --  2.5* 3.2* 3.8 3.6  CL 91* 95*  --  96*  --  101 107  CO2 33* 32  --  35*  --  29 27  GLUCOSE 125* 81  --  80  --  72 76  BUN 14 13  --  14  --  11 7*  CREATININE 1.17* 1.08*  --  1.17*  --  1.05* 0.95  CALCIUM 7.5* 7.2*  --  7.5*  --  7.3* 7.5*  MG  --   --  1.5*  --   --   --   --    GFR: Estimated Creatinine Clearance: 41.7 mL/min (by C-G formula based on SCr of 0.95 mg/dL). Liver Function Tests: Recent Labs  Lab 02/09/23 1839  AST 24  ALT 10  ALKPHOS 106  BILITOT 0.7  PROT 5.5*  ALBUMIN 2.0*   No results for input(s): "LIPASE", "AMYLASE" in the last 168 hours. No results for input(s): "AMMONIA" in the last 168 hours. Coagulation Profile: No results for input(s): "INR", "PROTIME" in the last 168 hours. Cardiac Enzymes: No results for input(s): "CKTOTAL", "CKMB", "CKMBINDEX", "TROPONINI" in the last 168 hours. BNP (last 3 results) No results for input(s): "PROBNP" in the last 8760 hours. HbA1C: No results for input(s): "HGBA1C" in the last 72 hours. CBG: No results for input(s): "GLUCAP" in the last 168 hours. Lipid Profile: No results for input(s): "CHOL", "HDL", "LDLCALC", "TRIG", "CHOLHDL", "LDLDIRECT" in the last 72 hours. Thyroid Function Tests: No results for input(s): "TSH", "T4TOTAL", "FREET4", "T3FREE", "THYROIDAB" in the last 72 hours. Anemia Panel: No results for input(s): "VITAMINB12", "FOLATE", "FERRITIN", "TIBC", "IRON", "RETICCTPCT" in the  last 72 hours. Most Recent Urinalysis On File:     Component Value Date/Time   COLORURINE YELLOW (A) 02/10/2023 0006   APPEARANCEUR CLOUDY (A) 02/10/2023 0006   LABSPEC 1.012 02/10/2023 0006   PHURINE 5.0 02/10/2023 0006   GLUCOSEU NEGATIVE 02/10/2023 0006   HGBUR SMALL (A) 02/10/2023 0006   BILIRUBINUR NEGATIVE 02/10/2023 0006   KETONESUR 5 (A) 02/10/2023 0006   PROTEINUR NEGATIVE 02/10/2023 0006   NITRITE NEGATIVE 02/10/2023 0006   LEUKOCYTESUR LARGE (A) 02/10/2023 0006   Sepsis Labs: (procalcitonin:4,lacticidven:4) Microbiology: Recent Results (from the past 240 hour(s))  Blood culture (single)     Status: None   Collection Time: 02/09/23  6:39 PM   Specimen: BLOOD  Result Value Ref Range Status   Specimen Description BLOOD BLOOD RIGHT ARM  Final   Special Requests   Final    BOTTLES DRAWN AEROBIC AND ANAEROBIC Blood Culture results may not be optimal due to an excessive volume of blood received in culture bottles   Culture   Final    NO GROWTH 5 DAYS Performed at [facility], [address], [place], [place] [address]    Report Status 02/14/2023 FINAL  Final  C Difficile Quick Screen w PCR reflex     Status: Abnormal   Collection Time: 02/09/23  8:13 PM   Specimen: STOOL  Result Value Ref Range Status   C Diff antigen POSITIVE (A) NEGATIVE Final   C Diff toxin NEGATIVE NEGATIVE Final   C Diff interpretation Results are indeterminate. See PCR results.  Final    Comment: Performed at [facility], [address] [place], [place] [address]  C. Diff by PCR, Reflexed     Status: Abnormal   Collection Time: 02/09/23  8:13 PM  Result Value Ref Range Status   Toxigenic C. Difficile by PCR POSITIVE (A) NEGATIVE Final    Comment: Positive for toxigenic C. difficile with little to no toxin production. Only treat if clinical presentation suggests symptomatic illness. Performed at [facility], [address] [place], [place] [address]    Blood culture (single)     Status: None (Preliminary result)   Collection Time: 02/10/23 12:06 AM   Specimen: BLOOD  Result Value Ref Range Status   Specimen Description BLOOD  LEFT FOREARM  Final   Special Requests   Final    BOTTLES DRAWN AEROBIC AND ANAEROBIC Blood Culture results may not be optimal due to an inadequate volume of blood received in culture bottles   Culture   Final    NO GROWTH 4 DAYS Performed at [facility], [address] [place], [place] [address]    Report Status PENDING  Incomplete  Urine Culture     Status: Abnormal   Collection Time: 02/10/23 12:06 AM   Specimen: In/Out Cath Urine  Result Value Ref Range Status   Specimen Description   Final    IN/OUT CATH URINE Performed at [facility], [address] [place], [place] [address]    Special Requests   Final    NONE Performed at [facility], [address] [place], [place] [address]    Culture (A)  Final    >=100,000 COLONIES/mL ENTEROCOCCUS FAECALIS >=100,000 COLONIES/mL PSEUDOMONAS AERUGINOSA    Report Status 02/12/2023 FINAL  Final   Organism ID, Bacteria ENTEROCOCCUS FAECALIS (A)  Final   Organism ID, Bacteria PSEUDOMONAS AERUGINOSA (A)  Final      Susceptibility   Enterococcus faecalis - MIC*  AMPICILLIN <=2 SENSITIVE Sensitive     NITROFURANTOIN <=16 SENSITIVE Sensitive     VANCOMYCIN 1 SENSITIVE Sensitive     * >=100,000 COLONIES/mL ENTEROCOCCUS FAECALIS   Pseudomonas aeruginosa - MIC*    CEFTAZIDIME >=64 RESISTANT Resistant     CIPROFLOXACIN <=0.25 SENSITIVE Sensitive     GENTAMICIN 4 SENSITIVE Sensitive     IMIPENEM >=16 RESISTANT Resistant     PIP/TAZO >=128 RESISTANT Resistant     * >=100,000 COLONIES/mL PSEUDOMONAS AERUGINOSA  Culture, blood (Routine X 2) w Reflex to ID Panel     Status: None (Preliminary result)   Collection Time: 02/10/23  7:51 PM   Specimen: BLOOD  Result Value Ref Range Status   Specimen Description BLOOD BLOOD RIGHT HAND   Final   Special Requests   Final    BOTTLES DRAWN AEROBIC AND ANAEROBIC Blood Culture adequate volume   Culture   Final    NO GROWTH 4 DAYS Performed at [facility], [address], [place], [place] [address]    Report Status PENDING  Incomplete  Culture, blood (Routine X 2) w Reflex to ID Panel     Status: None (Preliminary result)   Collection Time: 02/10/23  7:52 PM   Specimen: BLOOD  Result Value Ref Range Status   Specimen Description BLOOD BLOOD RIGHT FOREARM  Final   Special Requests   Final    BOTTLES DRAWN AEROBIC AND ANAEROBIC Blood Culture adequate volume   Culture   Final    NO GROWTH 4 DAYS Performed at [facility], [address] [place], [place] [address]    Report Status PENDING  Incomplete  MRSA Next Gen by PCR, Nasal     Status: None   Collection Time: 02/10/23 11:15 PM   Specimen: Nasal Mucosa; Nasal Swab  Result Value Ref Range Status   MRSA by PCR Next Gen NOT DETECTED NOT DETECTED Final    Comment: (NOTE) The GeneXpert MRSA Assay (FDA approved for NASAL specimens only), is one component of a comprehensive MRSA colonization surveillance program. It is not intended to diagnose MRSA infection nor to guide or monitor treatment for MRSA infections. Test performance is not FDA approved in patients less than 31 years old. Performed at [facility], [address], [place], [place] [address]       Radiology Studies last 3 days: No results found.           LOS: 4 days    Sunnie Nielsen, DO [facility] 02/14/2023, 12:51 PM    Dictation software may have been used to generate the above note. Typos may occur and escape review in typed/dictated notes. Please contact Dr Lyn Hollingshead directly for clarity if needed.  Staff may message me via secure chat in Epic  but this may not receive an immediate response,  please page me for urgent matters!  If 7PM-7AM, please contact night coverage [email or web address]

## 2023-02-14 NOTE — TOC Progression Note (Signed)
Transition of Care [facility]) - Progression Note    Patient Details  Name: Mikayla [name] MRN: [id number] Date of Birth: 03-27-1941  Transition of [facility]) CM/SW Contact  Bing Quarry, RN Phone Number: 02/14/2023, 3:42 PM  Clinical Narrative:  02/14/23: Facility unable to readmit patient today, will Monday if flexiseal output is down and tube is out. Flexiseal to be discontinued today to see how patient does overnight. Signed FL2 inboxed and AC notified of this. DC Summary to be inboxed when ready on 02/15/23. Gabriel Cirri RN CM      Expected Discharge Plan: Long Term Nursing Home Barriers to Discharge: Continued Medical Work up  Expected Discharge Plan and Services       Living arrangements for the past 2 months: Skilled Nursing Facility                                       Social Determinants of Health (SDOH) Interventions SDOH Screenings   Food Insecurity: No Food Insecurity (02/10/2023)  Housing: Low Risk  (02/10/2023)  Transportation Needs: No Transportation Needs (02/10/2023)  Utilities: Not At Risk (02/10/2023)  Tobacco Use: Medium Risk (02/09/2023)    Readmission Risk Interventions     No data to display

## 2023-02-15 DIAGNOSIS — D72829 Elevated white blood cell count, unspecified: Secondary | ICD-10-CM | POA: Diagnosis not present

## 2023-02-15 DIAGNOSIS — A0472 Enterocolitis due to Clostridium difficile, not specified as recurrent: Secondary | ICD-10-CM | POA: Diagnosis not present

## 2023-02-15 DIAGNOSIS — R8271 Bacteriuria: Secondary | ICD-10-CM

## 2023-02-15 DIAGNOSIS — J42 Unspecified chronic bronchitis: Secondary | ICD-10-CM | POA: Diagnosis not present

## 2023-02-15 DIAGNOSIS — I10 Essential (primary) hypertension: Secondary | ICD-10-CM | POA: Diagnosis not present

## 2023-02-15 LAB — CULTURE, BLOOD (SINGLE): Culture: NO GROWTH

## 2023-02-15 LAB — CULTURE, BLOOD (ROUTINE X 2): Culture: NO GROWTH

## 2023-02-15 MED ORDER — ENSURE ENLIVE PO LIQD: 237.0000 mL | Freq: Three times a day (TID) | ORAL | 12 refills | Status: DC

## 2023-02-15 MED ORDER — ASCORBIC ACID 500 MG PO TABS: 500.0000 mg | ORAL_TABLET | Freq: Two times a day (BID) | ORAL | 0 refills | Status: DC

## 2023-02-15 MED ORDER — VANCOMYCIN HCL 125 MG PO CAPS: 125.0000 mg | ORAL_CAPSULE | Freq: Four times a day (QID) | ORAL | 0 refills | Status: AC

## 2023-02-15 NOTE — Discharge Summary (Signed)
Physician Discharge Summary   Patient: Mikayla [name] MRN: [id number]  DOB: May 29, 1941   Admit:     Date of Admission: 02/09/2023 Admitted from: SNF   Discharge: Date of discharge: 02/15/23 Disposition: Skilled nursing facility Condition at discharge: fair  CODE STATUS: DNR/DNI     Discharge Physician: Sunnie Nielsen, DO [facility]     PCP: Clovis Cao, MD  Recommendations for Outpatient Follow-up:  Follow up with PCP Clovis Cao, MD in 1-2 weeks Please obtain labs/tests: CBC, CMP in 1-2 weeks Please follow up on the following pending results: none PCP AND OTHER OUTPATIENT PROVIDERS: SEE BELOW FOR SPECIFIC DISCHARGE INSTRUCTIONS PRINTED FOR PATIENT IN ADDITION TO GENERIC AVS PATIENT INFO    Discharge Instructions     Diet - low sodium heart healthy   Complete by: As directed    At least 1200 mL fluids intake per day   Discharge wound care:   Complete by: As directed    FREQUENT PERI-CARE to address pressure ulcer and urinary incontinence/diarrhea. Remove Foley when able otherwise change Foley weekly or prn   Increase activity slowly   Complete by: As directed          Discharge Diagnoses: Principal Problem:   Leukocytosis Active Problems:   C. difficile diarrhea   Hypokalemia   HTN (hypertension)   PAF (paroxysmal atrial fibrillation)   Parkinson's disease   Functional quadriplegia   Sacral decubitus ulcer   COPD (chronic obstructive pulmonary disease)   Chronic respiratory failure with hypoxia   Stage 3a chronic kidney disease   Candidal intertrigo   C. difficile colitis   Asymptomatic bacteriuria - enterococcus faecalis, pseudomonas aeruginosa   Pseudomonas aeruginosa colonization       Hospital Course: Mikayla [name] is a 82 y.o. female with medical history significant for Parkinson's disease, COPD on O2 at 3 L,, A-fib, hypertension, with remote history of a 85-month hospitalization in 2017 for empyema s/p right  lung decortication SNF resident since and  total care dependent requiring assistance for all ADLs, with chronic sacral wound, currently undergoing IV treatment via PICC line (present for weeks)   for sepsis secondary to UTI x 2, who was sent to the ED with a concern for continued infection/elevated WBC in spite of antibiotic treatment.  She has had no fever or chills, but she was noted to have a diaper rash and soft stool in her diaper.  04/16: ED course and data review: BP 167/95 with otherwise normal vitals Labs significant for WBC of 16,000 with normal lactic acid and a Pro-Calc of 17, hemoglobin 10.4 which appears to be chronic. Potassium is 2.5, creatinine 1.17 at baseline. C. difficile antigen positive. Patient was given potassium supplementation and hospitalist consulted for admission. Leukocytosis d/t C. difficile versus UTI versus bacteremia given prolonged PICC x weeks. Treating CDiff. Holding Invanz abx pending UA/UCx. 04/17: UA abnormal, will start Abx for UTI given illness severity and UTI risk factors. Await cultures. Spoke w/ daughter in evening who reports patient is looking more like her baseline.  04/18: low K down to 2.5. Awaiting cultures. Restarting fluids d/t soft BP. D/c abx for UTI - likely colonized and no symptoms at this time. Continuing Foley d/t incontinence and skin breakdown. Flexiseal placed.  04/19: BCx NGx2d, UCx pending identification and susceptibilities. Stool still loose/liquid, maintain flexiseal for now.  04/20: still fairly substantial liquid stool output.  04/21: stool output easing up, removing rectal tube and SNF should be able to take her  back tomorrow, cannot today.  04/22: remains stable, BM improving - less frequent and more solid   Consultants:  none  Procedures: none      ASSESSMENT & PLAN:  Leukocytosis - resolved Likely related to C. difficile versus recent UTI  Treated for C. difficile as outlined under respective problem D/c abx for UTI -  likely colonized and no symptoms at this time or prior to hospital arrival Removed PICC  C. difficile diarrhea - improving  Oral vancomycin Enteric precautions Frequent hygiene checks and peri-care    Urinary incontinence Recurrent UTI Colonized w/ enterococcus and pseudomonas Continue Foley for now given skin breakdown and incontinence as well as diarrhea FREQUENT PERI-CARE  Weekly Foley changes at least, remove Foley ASAP  Hypokalemia - resolved Suspect secondary to diarrhea IV and oral repletion and monitor  Hypomagnesemia Replaced Follow outpatient   PAF (paroxysmal atrial fibrillation) Continue amiodarone Holding carvedilol d/t lower BP  Parkinson's disease Continue gabapentin  HTN (hypertension) Holding home carvedilol d/t low BP but BP is improving, restart possible outpatient  Holding Lasix given recent hypokalemia  Functional quadriplegia d/t CVA sequelae  Sacral decubitus ulcer Supportive care and decubitus precautions Continuing Foley d/t incontinence and skin breakdown  COPD (chronic obstructive pulmonary disease) Chronic respiratory failure with hypoxia Not acutely exacerbated Continue home inhalers with DuoNebs as needed Continue supplemental oxygen and titrate as needed to keep sats over 90 to 92%.  Stage 3a chronic kidney disease Renal function at baseline Monitor BMP  Asymptomatic Bacteriuria: Enterococcus and Pseudomonas Maintain contact precautions   Candidal intertrigo Nystatin cream and barrier precautions. Diflucan po 150 mg x1              Discharge Instructions  Allergies as of 02/15/2023       Reactions   Levaquin [levofloxacin]    Other Nausea And Vomiting   Sulfa Antibiotics Hives   Oxycodone Anxiety, Other (See Comments)        Medication List     STOP taking these medications    acetaminophen 325 MG tablet Commonly known as: TYLENOL   acetaminophen 500 MG tablet Commonly known as: TYLENOL    carvedilol 3.125 MG tablet Commonly known as: COREG       TAKE these medications    amiodarone 200 MG tablet Commonly known as: PACERONE Take 1 tablet (200 mg total) by mouth daily.   ammonium lactate 12 % cream Commonly known as: AMLACTIN Apply 1 application topically as needed for dry skin.   ascorbic acid 500 MG tablet Commonly known as: VITAMIN C Take 1 tablet (500 mg total) by mouth 2 (two) times daily.   carbidopa-levodopa 25-100 MG tablet Commonly known as: SINEMET IR Take 1 tablet by mouth 3 (three) times daily. 0900/1300/2100   CENTROVITE Tabs Take 1 tablet by mouth daily.   Healthy Eyes Supervision 2 Caps Take 1 capsule by mouth in the morning and at bedtime. 0800/1800   Combivent Respimat 20-100 MCG/ACT Aers respimat Generic drug: Ipratropium-Albuterol Inhale 1 puff into the lungs 3 (three) times daily.   ipratropium-albuterol 0.5-2.5 (3) MG/3ML Soln Commonly known as: DUONEB Take 3 mLs by nebulization every 6 (six) hours as needed.   feeding supplement Liqd Take 237 mLs by mouth 3 (three) times daily between meals.   ferrous sulfate 324 (65 Fe) MG Tbec Take 1 tablet by mouth daily. Only Monday and thursday   furosemide 20 MG tablet Commonly known as: LASIX Take 1 tablet (20 mg total) by mouth daily.   gabapentin  100 MG capsule Commonly known as: NEURONTIN Take 100 mg by mouth at bedtime.   guaiFENesin 600 MG 12 hr tablet Commonly known as: MUCINEX Take 600 mg by mouth 2 (two) times daily as needed for cough.   omeprazole 20 MG tablet Commonly known as: PRILOSEC OTC Take 20 mg by mouth 2 (two) times daily.   OXYGEN Inhale 2 L/min into the lungs continuous.   PARoxetine 30 MG tablet Commonly known as: PAXIL Take 30 mg by mouth daily.   Pataday 0.7 % Soln Generic drug: Olopatadine HCl Place 1 drop into both eyes daily.   senna 8.6 MG Tabs tablet Commonly known as: SENOKOT Take 2 tablets by mouth at bedtime.   vancomycin 125 MG  capsule Commonly known as: VANCOCIN Take 1 capsule (125 mg total) by mouth 4 (four) times daily for 5 days.   zinc oxide 20 % ointment Apply 1 application topically every 8 (eight) hours. To groin and thighs each shift               Discharge Care Instructions  (From admission, onward)           Start     Ordered   02/15/23 0000  Discharge wound care:       Comments: FREQUENT PERI-CARE to address pressure ulcer and urinary incontinence/diarrhea. Remove Foley when able otherwise change Foley weekly or prn   02/15/23 0950              Allergies  Allergen Reactions   Levaquin [Levofloxacin]    Other Nausea And Vomiting   Sulfa Antibiotics Hives   Oxycodone Anxiety and Other (See Comments)     Subjective: pt reports no concerns. Denies pain or SOB.    Discharge Exam: BP (!) 145/44 (BP Location: Right Arm)   Pulse 76   Temp 97.9 F (36.6 C)   Resp 18   Ht 5\' 3"  (1.6 m)   Wt 65.8 kg   SpO2 100%   BMI 25.69 kg/m  General: Pt is alert, awake, not in acute distress Cardiovascular: RRR, S1/S2 WNL, no rubs, no gallops Respiratory: CTA bilaterally, no wheezing, no rhonchi Abdominal: Soft, NT, ND, bowel sounds WNL Extremities: trace LE bilateral edema, no cyanosis     The results of significant diagnostics from this hospitalization (including imaging, microbiology, ancillary and laboratory) are listed below for reference.     Microbiology: Recent Results (from the past 240 hour(s))  Blood culture (single)     Status: None   Collection Time: 02/09/23  6:39 PM   Specimen: BLOOD  Result Value Ref Range Status   Specimen Description BLOOD BLOOD RIGHT ARM  Final   Special Requests   Final    BOTTLES DRAWN AEROBIC AND ANAEROBIC Blood Culture results may not be optimal due to an excessive volume of blood received in culture bottles   Culture   Final    NO GROWTH 5 DAYS Performed at [facility], [address], [place], [place] [address]     Report Status 02/14/2023 FINAL  Final  C Difficile Quick Screen w PCR reflex     Status: Abnormal   Collection Time: 02/09/23  8:13 PM   Specimen: STOOL  Result Value Ref Range Status   C Diff antigen POSITIVE (A) NEGATIVE Final   C Diff toxin NEGATIVE NEGATIVE Final   C Diff interpretation Results are indeterminate. See PCR results.  Final    Comment: Performed at [facility], [address] [place],  [place] [address]  C. Diff by PCR, Reflexed     Status: Abnormal   Collection Time: 02/09/23  8:13 PM  Result Value Ref Range Status   Toxigenic C. Difficile by PCR POSITIVE (A) NEGATIVE Final    Comment: Positive for toxigenic C. difficile with little to no toxin production. Only treat if clinical presentation suggests symptomatic illness. Performed at [facility], [address] [place], [place] [address]   Blood culture (single)     Status: None (Preliminary result)   Collection Time: 02/10/23 12:06 AM   Specimen: BLOOD  Result Value Ref Range Status   Specimen Description BLOOD  LEFT FOREARM  Final   Special Requests   Final    BOTTLES DRAWN AEROBIC AND ANAEROBIC Blood Culture results may not be optimal due to an inadequate volume of blood received in culture bottles   Culture   Final    NO GROWTH 4 DAYS Performed at [facility], [address] [place], [place] [address]    Report Status PENDING  Incomplete  Urine Culture     Status: Abnormal   Collection Time: 02/10/23 12:06 AM   Specimen: In/Out Cath Urine  Result Value Ref Range Status   Specimen Description   Final    IN/OUT CATH URINE Performed at [facility], [address], [place], [place] [address]    Special Requests   Final    NONE Performed at [facility], [address] [place], [place] [address]    Culture (A)  Final    >=100,000 COLONIES/mL ENTEROCOCCUS FAECALIS >=100,000 COLONIES/mL PSEUDOMONAS AERUGINOSA    Report Status 02/12/2023 FINAL  Final    Organism ID, Bacteria ENTEROCOCCUS FAECALIS (A)  Final   Organism ID, Bacteria PSEUDOMONAS AERUGINOSA (A)  Final      Susceptibility   Enterococcus faecalis - MIC*    AMPICILLIN <=2 SENSITIVE Sensitive     NITROFURANTOIN <=16 SENSITIVE Sensitive     VANCOMYCIN 1 SENSITIVE Sensitive     * >=100,000 COLONIES/mL ENTEROCOCCUS FAECALIS   Pseudomonas aeruginosa - MIC*    CEFTAZIDIME >=64 RESISTANT Resistant     CIPROFLOXACIN <=0.25 SENSITIVE Sensitive     GENTAMICIN 4 SENSITIVE Sensitive     IMIPENEM >=16 RESISTANT Resistant     PIP/TAZO >=128 RESISTANT Resistant     * >=100,000 COLONIES/mL PSEUDOMONAS AERUGINOSA  Culture, blood (Routine X 2) w Reflex to ID Panel     Status: None (Preliminary result)   Collection Time: 02/10/23  7:51 PM   Specimen: BLOOD  Result Value Ref Range Status   Specimen Description BLOOD BLOOD RIGHT HAND  Final   Special Requests   Final    BOTTLES DRAWN AEROBIC AND ANAEROBIC Blood Culture adequate volume   Culture   Final    NO GROWTH 4 DAYS Performed at [facility], [address], [place], [place] [address]    Report Status PENDING  Incomplete  Culture, blood (Routine X 2) w Reflex to ID Panel     Status: None (Preliminary result)   Collection Time: 02/10/23  7:52 PM   Specimen: BLOOD  Result Value Ref Range Status   Specimen Description BLOOD BLOOD RIGHT FOREARM  Final   Special Requests   Final    BOTTLES DRAWN AEROBIC AND ANAEROBIC Blood Culture adequate volume   Culture   Final    NO GROWTH 4 DAYS Performed at [facility], [address], [place], [place] [address]    Report Status PENDING  Incomplete  MRSA Next Gen by PCR, Nasal     Status: None   Collection Time: 02/10/23 11:15 PM   Specimen: Nasal Mucosa; Nasal Swab  Result Value Ref Range Status   MRSA by PCR Next Gen NOT DETECTED NOT DETECTED Final    Comment: (NOTE) The GeneXpert MRSA Assay (FDA approved for NASAL specimens only), is one component of a comprehensive  MRSA colonization surveillance program. It is not intended to diagnose MRSA infection nor to guide or monitor treatment for MRSA infections. Test performance is not FDA approved in patients less than 4 years old. Performed at [facility], [address] [place], [place] [address]      Labs: BNP (last 3 results) No results for input(s): "BNP" in the last 8760 hours. Basic Metabolic Panel: Recent Labs  Lab 02/09/23 1839 02/10/23 0457 02/11/23 0620 02/11/23 0628 02/11/23 1341 02/12/23 0628 02/14/23 0503  NA 137 138  --  140  --  138 141  K 2.5* 3.0*  --  2.5* 3.2* 3.8 3.6  CL 91* 95*  --  96*  --  101 107  CO2 33* 32  --  35*  --  29 27  GLUCOSE 125* 81  --  80  --  72 76  BUN 14 13  --  14  --  11 7*  CREATININE 1.17* 1.08*  --  1.17*  --  1.05* 0.95  CALCIUM 7.5* 7.2*  --  7.5*  --  7.3* 7.5*  MG  --   --  1.5*  --   --   --   --    Liver Function Tests: Recent Labs  Lab 02/09/23 1839  AST 24  ALT 10  ALKPHOS 106  BILITOT 0.7  PROT 5.5*  ALBUMIN 2.0*   No results for input(s): "LIPASE", "AMYLASE" in the last 168 hours. No results for input(s): "AMMONIA" in the last 168 hours. CBC: Recent Labs  Lab 02/09/23 1839 02/10/23 0457 02/11/23 0628 02/12/23 0628 02/14/23 0503  WBC 15.9* 12.5* 10.3 12.1* 9.5  NEUTROABS 13.0*  --   --   --   --   HGB 10.4* 8.1* 9.0* 8.7* 8.3*  HCT 34.2* 26.5* 28.8* 28.7* 27.1*  MCV 91.4 91.1 90.0 90.8 92.5  PLT 250 331 345 351 303   Cardiac Enzymes: No results for input(s): "CKTOTAL", "CKMB", "CKMBINDEX", "TROPONINI" in the last 168 hours. BNP: Invalid input(s): "POCBNP" CBG: No results for input(s): "GLUCAP" in the last 168 hours. D-Dimer No results for input(s): "DDIMER" in the last 72 hours. Hgb A1c No results for input(s): "HGBA1C" in the last 72 hours. Lipid Profile No results for input(s): "CHOL", "HDL", "LDLCALC", "TRIG", "CHOLHDL", "LDLDIRECT" in the last 72 hours. Thyroid function studies No results  for input(s): "TSH", "T4TOTAL", "T3FREE", "THYROIDAB" in the last 72 hours.  Invalid input(s): "FREET3" Anemia work up No results for input(s): "VITAMINB12", "FOLATE", "FERRITIN", "TIBC", "IRON", "RETICCTPCT" in the last 72 hours. Urinalysis    Component Value Date/Time   COLORURINE YELLOW (A) 02/10/2023 0006   APPEARANCEUR CLOUDY (A) 02/10/2023 0006   LABSPEC 1.012 02/10/2023 0006   PHURINE 5.0 02/10/2023 0006   GLUCOSEU NEGATIVE 02/10/2023 0006   HGBUR SMALL (A) 02/10/2023 0006   BILIRUBINUR NEGATIVE 02/10/2023 0006   KETONESUR 5 (A) 02/10/2023 0006   PROTEINUR NEGATIVE 02/10/2023 0006   NITRITE NEGATIVE 02/10/2023 0006   LEUKOCYTESUR LARGE (A) 02/10/2023 0006   Sepsis Labs Recent Labs  Lab 02/10/23 0457 02/11/23 8119 02/12/23 1478 02/14/23 0503  WBC 12.5* 10.3 12.1* 9.5   Microbiology Recent Results (from the past 240 hour(s))  Blood culture (single)     Status: None   Collection Time: 02/09/23  6:39 PM   Specimen: BLOOD  Result Value Ref Range Status   Specimen Description BLOOD BLOOD RIGHT ARM  Final   Special Requests   Final    BOTTLES DRAWN AEROBIC AND ANAEROBIC Blood Culture results may not be optimal due to an excessive volume of blood received in culture bottles   Culture   Final    NO GROWTH 5 DAYS Performed at [facility], [address] [place], [place] [address]    Report Status 02/14/2023 FINAL  Final  C Difficile Quick Screen w PCR reflex     Status: Abnormal   Collection Time: 02/09/23  8:13 PM   Specimen: STOOL  Result Value Ref Range Status   C Diff antigen POSITIVE (A) NEGATIVE Final   C Diff toxin NEGATIVE NEGATIVE Final   C Diff interpretation Results are indeterminate. See PCR results.  Final    Comment: Performed at [facility], [address] [place], [place] [address]  C. Diff by PCR, Reflexed     Status: Abnormal   Collection Time: 02/09/23  8:13 PM  Result Value Ref Range Status   Toxigenic C. Difficile by PCR  POSITIVE (A) NEGATIVE Final    Comment: Positive for toxigenic C. difficile with little to no toxin production. Only treat if clinical presentation suggests symptomatic illness. Performed at [facility], [address] [place], [place] [address]   Blood culture (single)     Status: None (Preliminary result)   Collection Time: 02/10/23 12:06 AM   Specimen: BLOOD  Result Value Ref Range Status   Specimen Description BLOOD  LEFT FOREARM  Final   Special Requests   Final    BOTTLES DRAWN AEROBIC AND ANAEROBIC Blood Culture results may not be optimal due to an inadequate volume of blood received in culture bottles   Culture   Final    NO GROWTH 4 DAYS Performed at [facility], [address], [place], [place] [address]    Report Status PENDING  Incomplete  Urine Culture     Status: Abnormal   Collection Time: 02/10/23 12:06 AM   Specimen: In/Out Cath Urine  Result Value Ref Range Status   Specimen Description   Final    IN/OUT CATH URINE Performed at [facility], [address], [place], [place] [address]    Special Requests   Final    NONE Performed at [facility], [address] [place], [place] [address]    Culture (A)  Final    >=100,000 COLONIES/mL ENTEROCOCCUS FAECALIS >=100,000 COLONIES/mL PSEUDOMONAS AERUGINOSA    Report Status 02/12/2023 FINAL  Final   Organism ID, Bacteria ENTEROCOCCUS FAECALIS (A)  Final   Organism ID, Bacteria PSEUDOMONAS AERUGINOSA (A)  Final      Susceptibility   Enterococcus faecalis - MIC*    AMPICILLIN <=2 SENSITIVE Sensitive     NITROFURANTOIN <=16 SENSITIVE Sensitive     VANCOMYCIN 1 SENSITIVE Sensitive     * >=100,000 COLONIES/mL ENTEROCOCCUS FAECALIS   Pseudomonas aeruginosa - MIC*    CEFTAZIDIME >=64 RESISTANT Resistant     CIPROFLOXACIN <=0.25 SENSITIVE Sensitive     GENTAMICIN 4 SENSITIVE Sensitive     IMIPENEM >=16 RESISTANT Resistant     PIP/TAZO >=128 RESISTANT Resistant     * >=100,000  COLONIES/mL PSEUDOMONAS AERUGINOSA  Culture, blood (Routine X 2) w Reflex to ID Panel     Status: None (Preliminary result)   Collection Time: 02/10/23  7:51 PM   Specimen: BLOOD  Result Value Ref Range Status   Specimen Description BLOOD BLOOD RIGHT HAND  Final   Special Requests   Final    BOTTLES DRAWN AEROBIC AND ANAEROBIC Blood Culture adequate volume   Culture   Final    NO GROWTH 4 DAYS Performed at [facility], [address], [place], [place] [address]    Report Status PENDING  Incomplete  Culture, blood (Routine X 2) w Reflex to ID Panel     Status: None (Preliminary result)   Collection Time: 02/10/23  7:52 PM   Specimen: BLOOD  Result Value Ref Range Status   Specimen Description BLOOD BLOOD RIGHT FOREARM  Final   Special Requests   Final    BOTTLES DRAWN AEROBIC AND ANAEROBIC Blood Culture adequate volume   Culture   Final    NO GROWTH 4 DAYS Performed at [facility], [address] [place], [place] [address]    Report Status PENDING  Incomplete  MRSA Next Gen by PCR, Nasal     Status: None   Collection Time: 02/10/23 11:15 PM   Specimen: Nasal Mucosa; Nasal Swab  Result Value Ref Range Status   MRSA by PCR Next Gen NOT DETECTED NOT DETECTED Final    Comment: (NOTE) The GeneXpert MRSA Assay (FDA approved for NASAL specimens only), is one component of a comprehensive MRSA colonization surveillance program. It is not intended to diagnose MRSA infection nor to guide or monitor treatment for MRSA infections. Test performance is not FDA approved in patients less than 75 years old. Performed at [facility], [address] [place], [place] [address]    Imaging DG Chest [place] 1 View  Result Date: 02/10/2023 CLINICAL DATA:  Weakness EXAM: PORTABLE CHEST 1 VIEW COMPARISON:  Chest radiograph 11/02/2018 FINDINGS: Stable cardiomediastinal silhouette. Low lung volumes accentuate pulmonary vascularity. Chronic bilateral bronchitic changes. No  focal consolidation, pleural effusion, or pneumothorax. Left basilar calcified granuloma. Remote right rib fractures. Advanced arthritis left shoulder. IMPRESSION: Low lung volumes with chronic bronchitic changes. Electronically Signed   By: Minerva Fester M.D.   On: 02/10/2023 01:03      Time coordinating discharge: over 30 minutes  SIGNED:  Sunnie Nielsen DO [facility]

## 2023-02-15 NOTE — TOC Transition Note (Signed)
Transition of Care [facility]) - CM/SW Discharge Note   Patient Details  Name: Mikayla [name] MRN: [id number] Date of Birth: Mar 14, 1941  [facility]) CM/SW Contact:  Chapman Fitch, RN Phone Number: 02/15/2023, 10:39 AM   Clinical Narrative:    Patient will DC to:[facility] Anticipated DC date: 02/15/23  Family notified:Vm left for [name], spoke with [name] Transport by: Wendie Simmer  Per MD patient ready for DC to . RN, patient's family, and facility notified of DC. Discharge Summary sent to facility. RN given number for report. DC packet on chart. Ambulance transport requested for patient.  TOC signing off.  Bevelyn Ngo [facility] [phone]    Final next level of care: Skilled Nursing Facility Barriers to Discharge: Continued Medical Work up   Patient Goals and CMS Choice      Discharge Placement                Patient [facility] bed at: [facility] Patient to be transferred to facility by: [facility] Name of family member notified: [facility] Additional resources added to the After Visit Summary for                                       Social Determinants of Health (SDOH) Interventions SDOH Screenings   Food Insecurity: No Food Insecurity (02/10/2023)  Housing: Low Risk  (02/10/2023)  Transportation Needs: No Transportation Needs (02/10/2023)  Utilities: Not At Risk (02/10/2023)  Tobacco Use: Medium Risk (02/09/2023)     Readmission Risk Interventions     No data to display

## 2023-03-25 ENCOUNTER — Inpatient Hospital Stay
Admission: EM | Admit: 2023-03-25 | Discharge: 2023-03-26 | DRG: 640 | Disposition: A | Discharge status: Skilled Nursing Facility | Payer: Medicare Other | Source: Skilled Nursing Facility | Attending: Osteopathic Medicine | Admitting: Osteopathic Medicine

## 2023-03-25 ENCOUNTER — Other Ambulatory Visit: Payer: Self-pay

## 2023-03-25 ENCOUNTER — Inpatient Hospital Stay: Payer: Medicare Other

## 2023-03-25 DIAGNOSIS — Z7401 Bed confinement status: Secondary | ICD-10-CM

## 2023-03-25 DIAGNOSIS — Z801 Family history of malignant neoplasm of trachea, bronchus and lung: Secondary | ICD-10-CM

## 2023-03-25 DIAGNOSIS — G20A1 Parkinson's disease without dyskinesia, without mention of fluctuations: Secondary | ICD-10-CM | POA: Diagnosis present

## 2023-03-25 DIAGNOSIS — Z881 Allergy status to other antibiotic agents status: Secondary | ICD-10-CM

## 2023-03-25 DIAGNOSIS — Z8249 Family history of ischemic heart disease and other diseases of the circulatory system: Secondary | ICD-10-CM | POA: Diagnosis not present

## 2023-03-25 DIAGNOSIS — L89610 Pressure ulcer of right heel, unstageable: Secondary | ICD-10-CM | POA: Diagnosis present

## 2023-03-25 DIAGNOSIS — L89150 Pressure ulcer of sacral region, unstageable: Secondary | ICD-10-CM | POA: Diagnosis not present

## 2023-03-25 DIAGNOSIS — I4891 Unspecified atrial fibrillation: Secondary | ICD-10-CM | POA: Diagnosis not present

## 2023-03-25 DIAGNOSIS — I110 Hypertensive heart disease with heart failure: Secondary | ICD-10-CM | POA: Diagnosis present

## 2023-03-25 DIAGNOSIS — D72829 Elevated white blood cell count, unspecified: Secondary | ICD-10-CM | POA: Diagnosis present

## 2023-03-25 DIAGNOSIS — F0390 Unspecified dementia without behavioral disturbance: Secondary | ICD-10-CM | POA: Diagnosis present

## 2023-03-25 DIAGNOSIS — L89110 Pressure ulcer of right upper back, unstageable: Secondary | ICD-10-CM | POA: Diagnosis present

## 2023-03-25 DIAGNOSIS — J449 Chronic obstructive pulmonary disease, unspecified: Secondary | ICD-10-CM | POA: Diagnosis not present

## 2023-03-25 DIAGNOSIS — Z87891 Personal history of nicotine dependence: Secondary | ICD-10-CM

## 2023-03-25 DIAGNOSIS — Z800 Family history of malignant neoplasm of digestive organs: Secondary | ICD-10-CM

## 2023-03-25 DIAGNOSIS — M109 Gout, unspecified: Secondary | ICD-10-CM | POA: Diagnosis not present

## 2023-03-25 DIAGNOSIS — Z8673 Personal history of transient ischemic attack (TIA), and cerebral infarction without residual deficits: Secondary | ICD-10-CM | POA: Diagnosis not present

## 2023-03-25 DIAGNOSIS — K219 Gastro-esophageal reflux disease without esophagitis: Secondary | ICD-10-CM | POA: Diagnosis not present

## 2023-03-25 DIAGNOSIS — Z6825 Body mass index (BMI) 25.0-25.9, adult: Secondary | ICD-10-CM

## 2023-03-25 DIAGNOSIS — L89322 Pressure ulcer of left buttock, stage 2: Secondary | ICD-10-CM | POA: Diagnosis not present

## 2023-03-25 DIAGNOSIS — L89893 Pressure ulcer of other site, stage 3: Secondary | ICD-10-CM | POA: Diagnosis present

## 2023-03-25 DIAGNOSIS — Z9981 Dependence on supplemental oxygen: Secondary | ICD-10-CM

## 2023-03-25 DIAGNOSIS — Z66 Do not resuscitate: Secondary | ICD-10-CM | POA: Diagnosis present

## 2023-03-25 DIAGNOSIS — M1990 Unspecified osteoarthritis, unspecified site: Secondary | ICD-10-CM | POA: Diagnosis present

## 2023-03-25 DIAGNOSIS — Z885 Allergy status to narcotic agent status: Secondary | ICD-10-CM

## 2023-03-25 DIAGNOSIS — I10 Essential (primary) hypertension: Secondary | ICD-10-CM | POA: Diagnosis not present

## 2023-03-25 DIAGNOSIS — Z87440 Personal history of urinary (tract) infections: Secondary | ICD-10-CM

## 2023-03-25 DIAGNOSIS — E876 Hypokalemia: Principal | ICD-10-CM

## 2023-03-25 DIAGNOSIS — I509 Heart failure, unspecified: Secondary | ICD-10-CM | POA: Diagnosis present

## 2023-03-25 DIAGNOSIS — F0280 Dementia in other diseases classified elsewhere without behavioral disturbance: Secondary | ICD-10-CM | POA: Diagnosis not present

## 2023-03-25 DIAGNOSIS — Z825 Family history of asthma and other chronic lower respiratory diseases: Secondary | ICD-10-CM

## 2023-03-25 DIAGNOSIS — Z882 Allergy status to sulfonamides status: Secondary | ICD-10-CM

## 2023-03-25 DIAGNOSIS — Z79899 Other long term (current) drug therapy: Secondary | ICD-10-CM | POA: Diagnosis not present

## 2023-03-25 DIAGNOSIS — E43 Unspecified severe protein-calorie malnutrition: Secondary | ICD-10-CM | POA: Diagnosis not present

## 2023-03-25 LAB — COMPREHENSIVE METABOLIC PANEL
ALT: 5 U/L (ref 0–44)
AST: 24 U/L (ref 15–41)
Albumin: 2 g/dL — ABNORMAL LOW (ref 3.5–5.0)
Alkaline Phosphatase: 97 U/L (ref 38–126)
Anion gap: 14 (ref 5–15)
BUN: 11 mg/dL (ref 8–23)
CO2: 34 mmol/L — ABNORMAL HIGH (ref 22–32)
Calcium: 7.5 mg/dL — ABNORMAL LOW (ref 8.9–10.3)
Chloride: 86 mmol/L — ABNORMAL LOW (ref 98–111)
Creatinine, Ser: 1.14 mg/dL — ABNORMAL HIGH (ref 0.44–1.00)
GFR, Estimated: 48 mL/min — ABNORMAL LOW (ref >60)
Glucose, Bld: 81 mg/dL (ref 70–99)
Potassium: 2.1 mmol/L — CL (ref 3.5–5.1)
Sodium: 134 mmol/L — ABNORMAL LOW (ref 135–145)
Total Bilirubin: 0.8 mg/dL (ref 0.3–1.2)
Total Protein: 5.5 g/dL — ABNORMAL LOW (ref 6.5–8.1)

## 2023-03-25 LAB — CBC WITH DIFFERENTIAL/PLATELET
Abs Immature Granulocytes: 0.1 10*3/uL — ABNORMAL HIGH (ref 0.00–0.07)
Basophils Absolute: 0.1 10*3/uL (ref 0.0–0.1)
Basophils Relative: 1 %
Eosinophils Absolute: 0.1 10*3/uL (ref 0.0–0.5)
Eosinophils Relative: 1 %
HCT: 32.6 % — ABNORMAL LOW (ref 36.0–46.0)
Hemoglobin: 10.1 g/dL — ABNORMAL LOW (ref 12.0–15.0)
Immature Granulocytes: 1 %
Lymphocytes Relative: 14 %
Lymphs Abs: 2.1 10*3/uL (ref 0.7–4.0)
MCH: 28.1 pg (ref 26.0–34.0)
MCHC: 31 g/dL (ref 30.0–36.0)
MCV: 90.6 fL (ref 80.0–100.0)
Monocytes Absolute: 0.6 10*3/uL (ref 0.1–1.0)
Monocytes Relative: 4 %
Neutro Abs: 11.4 10*3/uL — ABNORMAL HIGH (ref 1.7–7.7)
Neutrophils Relative %: 79 %
Platelets: 507 10*3/uL — ABNORMAL HIGH (ref 150–400)
RBC: 3.6 MIL/uL — ABNORMAL LOW (ref 3.87–5.11)
RDW: 15.4 % (ref 11.5–15.5)
WBC: 14.4 10*3/uL — ABNORMAL HIGH (ref 4.0–10.5)
nRBC: 0 % (ref 0.0–0.2)

## 2023-03-25 LAB — BLOOD GAS, VENOUS
Bicarbonate: 44.3 mmol/L — ABNORMAL HIGH (ref 20.0–28.0)
pO2, Ven: 31 mmHg — CL (ref 32–45)

## 2023-03-25 LAB — MAGNESIUM: Magnesium: 1.9 mg/dL (ref 1.7–2.4)

## 2023-03-25 MED ORDER — ENOXAPARIN SODIUM 40 MG/0.4ML IJ SOSY
40.0000 mg | PREFILLED_SYRINGE | INTRAMUSCULAR | Status: DC
  Administered 2023-03-26: 40 mg via SUBCUTANEOUS
  Filled 2023-03-25: qty 0.4

## 2023-03-25 MED ORDER — ACETAMINOPHEN 325 MG PO TABS: 650.0000 mg | ORAL_TABLET | Freq: Four times a day (QID) | ORAL | Status: DC | PRN

## 2023-03-25 MED ORDER — ACETAMINOPHEN 650 MG RE SUPP: 650.0000 mg | Freq: Four times a day (QID) | RECTAL | Status: DC | PRN

## 2023-03-25 MED ORDER — ENSURE ENLIVE PO LIQD
237.0000 mL | Freq: Three times a day (TID) | ORAL | Status: DC
  Administered 2023-03-26: 237 mL via ORAL

## 2023-03-25 MED ORDER — SENNA 8.6 MG PO TABS: 2.0000 | ORAL_TABLET | Freq: Every day | ORAL | Status: DC

## 2023-03-25 MED ORDER — POTASSIUM CHLORIDE 10 MEQ/100ML IV SOLN
10.0000 meq | INTRAVENOUS | Status: AC
  Administered 2023-03-25 (×4): 10 meq via INTRAVENOUS
  Filled 2023-03-25 (×4): qty 100

## 2023-03-25 MED ORDER — IPRATROPIUM-ALBUTEROL 20-100 MCG/ACT IN AERS: 1.0000 | INHALATION_SPRAY | Freq: Three times a day (TID) | RESPIRATORY_TRACT | Status: DC

## 2023-03-25 MED ORDER — PAROXETINE HCL 30 MG PO TABS
30.0000 mg | ORAL_TABLET | Freq: Every day | ORAL | Status: DC
  Administered 2023-03-26: 30 mg via ORAL
  Filled 2023-03-25: qty 1

## 2023-03-25 MED ORDER — GUAIFENESIN ER 600 MG PO TB12: 600.0000 mg | ORAL_TABLET | Freq: Two times a day (BID) | ORAL | Status: DC | PRN

## 2023-03-25 MED ORDER — OLOPATADINE HCL 0.1 % OP SOLN
1.0000 [drp] | Freq: Every day | OPHTHALMIC | Status: DC
  Filled 2023-03-25: qty 5

## 2023-03-25 MED ORDER — AMIODARONE HCL 200 MG PO TABS
200.0000 mg | ORAL_TABLET | Freq: Every day | ORAL | Status: DC
  Administered 2023-03-26: 200 mg via ORAL
  Filled 2023-03-25: qty 1

## 2023-03-25 MED ORDER — PANTOPRAZOLE SODIUM 40 MG PO TBEC
40.0000 mg | DELAYED_RELEASE_TABLET | Freq: Two times a day (BID) | ORAL | Status: DC
  Administered 2023-03-26: 40 mg via ORAL
  Filled 2023-03-25: qty 1

## 2023-03-25 MED ORDER — GABAPENTIN 100 MG PO CAPS
100.0000 mg | ORAL_CAPSULE | Freq: Every day | ORAL | Status: DC
  Administered 2023-03-26: 100 mg via ORAL
  Filled 2023-03-25: qty 1

## 2023-03-25 MED ORDER — IPRATROPIUM-ALBUTEROL 0.5-2.5 (3) MG/3ML IN SOLN
3.0000 mL | Freq: Four times a day (QID) | RESPIRATORY_TRACT | Status: DC | PRN
  Administered 2023-03-26: 3 mL via RESPIRATORY_TRACT

## 2023-03-25 MED ORDER — CARBIDOPA-LEVODOPA 25-100 MG PO TABS
1.0000 | ORAL_TABLET | Freq: Three times a day (TID) | ORAL | Status: DC
  Administered 2023-03-26 (×2): 1 via ORAL
  Filled 2023-03-25 (×3): qty 1

## 2023-03-25 MED ORDER — FUROSEMIDE 40 MG PO TABS: 20.0000 mg | ORAL_TABLET | Freq: Every day | ORAL | Status: DC

## 2023-03-25 MED ORDER — NYSTATIN 100000 UNIT/GM EX POWD
1.0000 | Freq: Two times a day (BID) | CUTANEOUS | Status: DC
  Administered 2023-03-26: 1 via TOPICAL
  Filled 2023-03-25 (×2): qty 15

## 2023-03-25 MED ORDER — POTASSIUM CHLORIDE 20 MEQ PO PACK
40.0000 meq | PACK | Freq: Once | ORAL | Status: AC
  Administered 2023-03-26: 40 meq via ORAL
  Filled 2023-03-25: qty 2

## 2023-03-25 MED ORDER — IPRATROPIUM-ALBUTEROL 0.5-2.5 (3) MG/3ML IN SOLN
3.0000 mL | Freq: Three times a day (TID) | RESPIRATORY_TRACT | Status: DC
  Administered 2023-03-26: 3 mL via RESPIRATORY_TRACT
  Filled 2023-03-25: qty 3

## 2023-03-25 MED ORDER — POTASSIUM CHLORIDE CRYS ER 20 MEQ PO TBCR
20.0000 meq | EXTENDED_RELEASE_TABLET | Freq: Two times a day (BID) | ORAL | Status: DC
  Administered 2023-03-26: 20 meq via ORAL
  Filled 2023-03-25: qty 1

## 2023-03-25 MED ORDER — ALLOPURINOL 100 MG PO TABS
100.0000 mg | ORAL_TABLET | Freq: Every day | ORAL | Status: DC
  Administered 2023-03-26: 100 mg via ORAL
  Filled 2023-03-25: qty 1

## 2023-03-25 MED ORDER — SODIUM CHLORIDE 0.9% FLUSH
3.0000 mL | Freq: Two times a day (BID) | INTRAVENOUS | Status: DC
  Administered 2023-03-26 (×2): 3 mL via INTRAVENOUS

## 2023-03-25 NOTE — Assessment & Plan Note (Addendum)
New incidental finding.  Since the last admission CBC.  Given patient's mentation, clinical focus of infection is hard to rule out based on history and exam.  Therefore I will check blood cultures, chest x-ray, and a urinalysis is pending.  Antibiotics will be started if focus of infection is found.  Patient does not meet sepsis criteria at this time

## 2023-03-25 NOTE — ED Provider Notes (Signed)
[facility] Provider Note    Event Date/Time   First MD Initiated Contact with Patient 03/25/23 1812     (approximate)   History   Abnormal Lab   HPI  Mikayla [name] is a 82 y.o. female who presents to the emergency department today from her living facility because of concerns for a low potassium seen on blood work.  Family states that they have noticed that the patient has been more lethargic recently.  They state that she has also had UTIs in the past although recently had a bout of C. difficile after being on antibiotics for urinary tract infection. The patient herself is unable to give any significant history but denies any pain.   Physical Exam    General: Awake, alert, not completely oriented. CV:  Good peripheral perfusion. Regular rate, irregular rhythm. Resp:  Normal effort. Lungs clear. Abd:  No distention. Non tender.    ED Results / Procedures / Treatments   Labs (all labs ordered are listed, but only abnormal results are displayed) Labs Reviewed  CBC WITH DIFFERENTIAL/PLATELET - Abnormal; Notable for the following components:      Result Value   WBC 14.4 (*)    RBC 3.60 (*)    Hemoglobin 10.1 (*)    HCT 32.6 (*)    Platelets 507 (*)    Neutro Abs 11.4 (*)    Abs Immature Granulocytes 0.10 (*)    All other components within normal limits  COMPREHENSIVE METABOLIC PANEL - Abnormal; Notable for the following components:   Sodium 134 (*)    Potassium 2.1 (*)    Chloride 86 (*)    CO2 34 (*)    Creatinine, Ser 1.14 (*)    Calcium 7.5 (*)    Total Protein 5.5 (*)    Albumin 2.0 (*)    GFR, Estimated 48 (*)    All other components within normal limits  BLOOD GAS, VENOUS  MAGNESIUM     EKG  I, Phineas Semen, attending physician, personally viewed and interpreted this EKG  EKG Time: 1928 Rate: 84 Rhythm: atrial fibrillation Axis: left axis deviation Intervals: qtc 541 QRS: narrow, low voltage ST changes: no st  elevation Impression: abnormal ekg   RADIOLOGY None   PROCEDURES:  Critical Care performed: No  MEDICATIONS ORDERED IN ED: Medications  potassium chloride 10 mEq in 100 mL IVPB (has no administration in time range)     IMPRESSION / MDM / ASSESSMENT AND PLAN / ED COURSE  I reviewed the triage vital signs and the nursing notes.                              Differential diagnosis includes, but is not limited to, poor oral intake, GI losses, medications,  Patient's presentation is most consistent with acute presentation with potential threat to life or bodily function.   The patient is on the cardiac monitor to evaluate for evidence of arrhythmia and/or significant heart rate changes.  Patient presents to the emergency department today because of concerns for low potassium and found on outpatient blood work.  Blood work today does show significant hypokalemia of 2.1.  Magnesium level was checked and was normal.  Potassium repletion was started here in the emergency department.  Discussed with Dr. Maryjean Ka with the hospitalist service who will plan on admission.      FINAL CLINICAL IMPRESSION(S) / ED DIAGNOSES   Final diagnoses:  Hypokalemia  Note:  This document was prepared using Dragon voice recognition software and may include unintentional dictation errors.    Phineas Semen, MD 03/25/23 2232

## 2023-03-25 NOTE — ED Triage Notes (Signed)
Pt arrived via [facility] from [facility] with c/o abnormal labs. Per daughter pt's NP at the SNF pt needs to have an ABG drawn, her CO2 checked, along with her potassium. Pt is bed bound at baseline and hard of hearing.

## 2023-03-25 NOTE — Assessment & Plan Note (Addendum)
Rather severe at 2.1 mEq/L.Marland Kitchen  Patient has had previous episodes of hypokalemia.  Patient has received 40 mEq of IV KCl.  I will give 40 additional orally.  At this time I will just check a repeat KCl level.  Now.  Trend again in the morning.  We will not maintain the patient on telemetry given CODE STATUS and limited medical interventions.  Risk factor seems to be Lasix use.  Given that patient appears euvolemic at this time, will hold off Lasix till potassium is repleted.  Monitor daily weights and intake and output.  It may be reasonable to restart Lasix if patient develops fluid overload.

## 2023-03-25 NOTE — H&P (Signed)
History and Physical    Patient: Mikayla [name] OZH:[id number] DOB: 04-24-1941 DOA: 03/25/2023 DOS: the patient was seen and examined on 03/25/2023 PCP: Patient, No Pcp Per  Patient coming from: SNF  Chief Complaint:  Chief Complaint  Patient presents with   Abnormal Lab   HPI: Mikayla [name] is a 82 y.o. female with medical history significant of Parkinson disease with associated dementia.  Patient requires frequent orientation to her location.  Daughter is at the bedside and assist the primary historian for this encounter.  Patient was in her usual state of health till a couple of days ago, when some nonspecific generalized weakness is noted.  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  Patient does not communicate much, and requires frequent orientation to her location.  Patient had a CBC and a BMP checked and patient was found to have severe hypokalemia for which patient is transferred here to [facility] ER.  There is no report of patient having nausea vomiting or diarrhea.  Patient has had poor appetite chronically.  There is no reports of recent changes in her medication.  No report of focal paralysis that is new.  Patient has been started on IV KCl in the ER medical evaluation is sought.  Patient is on chronic 3 L/min of supplemental oxygen per daughter.  Diagnosis of COPD  Review of Systems: unable to review all systems due to the inability of the patient to answer questions. Past Medical History:  Diagnosis Date   Anemia    Atrial fibrillation (HCC)    CHF (congestive heart failure) (HCC)    Empyema (HCC)    in [facility] Aug 2017- stayed in hospital on for 2 months.   GERD (gastroesophageal reflux disease)    Hypertension    Insomnia    Major depressive disorder    Osteoarthritis    Parkinson disease    Past Surgical History:  Procedure Laterality Date   EMBOLIZATION N/A 11/03/2018    Procedure: EMBOLIZATION;  Surgeon: Annice Needy, MD;  Location: [facility] INVASIVE CV LAB;  Service: Cardiovascular;  Laterality: N/A;   ERCP N/A 08/23/2018   Procedure: ENDOSCOPIC RETROGRADE CHOLANGIOPANCREATOGRAPHY (ERCP);  Surgeon: Midge Minium, MD;  Location: [facility] ENDOSCOPY;  Service: Endoscopy;  Laterality: N/A;   PEG TUBE PLACEMENT     Social History:  reports that she has quit smoking. Her smoking use included cigarettes. She has never used smokeless tobacco. She reports that she does not drink alcohol and does not use drugs.  Allergies  Allergen Reactions   Levaquin [Levofloxacin]    Other Nausea And Vomiting   Sulfa Antibiotics Hives   Oxycodone Anxiety and Other (See Comments)    Family History  Problem Relation Age of Onset   Hypertension Mother    COPD Mother    Lung cancer Father    Liver cancer Sister     Prior to Admission medications   Medication Sig Start Date End Date Taking? Authorizing Provider  allopurinol (ZYLOPRIM) 100 MG tablet Take 100 mg by mouth daily. 03/25/23  Yes [provider]  amiodarone (PACERONE) 200 MG tablet Take 1 tablet (200 mg total) by mouth daily. 11/22/16  Yes Shaune Pollack, MD  ascorbic acid (VITAMIN C) 500 MG tablet Take 1 tablet (500 mg total) by mouth 2 (two) times daily. 02/15/23  Yes Sunnie Nielsen, DO  carbidopa-levodopa (SINEMET IR) 25-100 MG tablet Take 1 tablet by mouth 3 (  three) times daily. 0900/1300/2100 02/04/23  Yes [provider]  feeding supplement (ENSURE ENLIVE / ENSURE PLUS) LIQD Take 237 mLs by mouth 3 (three) times daily between meals. 02/15/23  Yes Sunnie Nielsen, DO  ferrous sulfate 324 (65 Fe) MG TBEC Take 1 tablet by mouth daily. Only Monday and thursday   Yes [provider]  furosemide (LASIX) 20 MG tablet Take 1 tablet (20 mg total) by mouth daily. 10/30/16  Yes Sudini, Wardell Heath, MD  gabapentin (NEURONTIN) 100 MG capsule Take 100 mg by mouth at bedtime.   Yes [provider]   guaiFENesin (MUCINEX) 600 MG 12 hr tablet Take 600 mg by mouth 2 (two) times daily as needed for cough.   Yes [provider]  Ipratropium-Albuterol (COMBIVENT RESPIMAT) 20-100 MCG/ACT AERS respimat Inhale 1 puff into the lungs 3 (three) times daily.   Yes [provider]  ipratropium-albuterol (DUONEB) 0.5-2.5 (3) MG/3ML SOLN Take 3 mLs by nebulization every 6 (six) hours as needed.   Yes [provider]  Multiple Vitamins-Minerals (CENTROVITE) TABS Take 1 tablet by mouth daily.   Yes [provider]  Multiple Vitamins-Minerals (HEALTHY EYES SUPERVISION 2) CAPS Take 1 capsule by mouth in the morning and at bedtime. 0800/1800 11/25/22  Yes [provider]  NYSTATIN powder Apply 1 Application topically 2 (two) times daily. 03/02/23  Yes [provider]  omeprazole (PRILOSEC OTC) 20 MG tablet Take 20 mg by mouth 2 (two) times daily.   Yes [provider]  OXYGEN Inhale 2 L/min into the lungs continuous.   Yes [provider]  PARoxetine (PAXIL) 30 MG tablet Take 30 mg by mouth daily.    Yes [provider]  PATADAY 0.7 % SOLN Place 1 drop into both eyes daily. 12/28/22  Yes [provider]  potassium chloride SA (KLOR-CON M) 20 MEQ tablet Take 20 mEq by mouth 2 (two) times daily. 03/25/23  Yes [provider]  senna (SENOKOT) 8.6 MG TABS tablet Take 2 tablets by mouth at bedtime.    Yes [provider]  ammonium lactate (AMLACTIN) 12 % cream Apply 1 application topically as needed for dry skin. Patient not taking: Reported on 02/10/2023    [provider]  zinc oxide 20 % ointment Apply 1 application topically every 8 (eight) hours. To groin and thighs each shift Patient not taking: Reported on 02/10/2023    [provider]    Physical Exam: Vitals:   03/25/23 2236 03/25/23 2243 03/25/23 2304 03/25/23 2304  BP:   (!) 143/82   Pulse: 80 80    Resp: 18 18    Temp:    98 F (36.7  C)  TempSrc:    Oral  SpO2: 98% 100%     General: Patient keeps eyes closed and does not engage in meaningful conversation during the encounter.  Patient seems to defer to daughter for everything.  Patient is also extremely hard of hearing.  Patient does not appear distressed Respiratory exam: Bilateral intravesicular Cardiovascular exam S1-S2 normal Abdomen soft nontender Extremities warm without edema.  Left-sided hemiplegia with some increased tone.  Right-sided hemiparesis with resting tremor. Data Reviewed:  Labs on Admission:  Results for orders placed or performed during the hospital encounter of 03/25/23 (from the past 24 hour(s))  CBC with Differential     Status: Abnormal   Collection Time: 03/25/23  6:31 PM  Result Value Ref Range   WBC 14.4 (H) 4.0 - 10.5 K/uL   RBC 3.60 (  L) 3.87 - 5.11 MIL/uL   Hemoglobin 10.1 (L) 12.0 - 15.0 g/dL   HCT 13.0 (L) 86.5 - 78.4 %   MCV 90.6 80.0 - 100.0 fL   MCH 28.1 26.0 - 34.0 pg   MCHC 31.0 30.0 - 36.0 g/dL   RDW 69.6 29.5 - 28.4 %   Platelets 507 (H) 150 - 400 K/uL   nRBC 0.0 0.0 - 0.2 %   Neutrophils Relative % 79 %   Neutro Abs 11.4 (H) 1.7 - 7.7 K/uL   Lymphocytes Relative 14 %   Lymphs Abs 2.1 0.7 - 4.0 K/uL   Monocytes Relative 4 %   Monocytes Absolute 0.6 0.1 - 1.0 K/uL   Eosinophils Relative 1 %   Eosinophils Absolute 0.1 0.0 - 0.5 K/uL   Basophils Relative 1 %   Basophils Absolute 0.1 0.0 - 0.1 K/uL   Immature Granulocytes 1 %   Abs Immature Granulocytes 0.10 (H) 0.00 - 0.07 K/uL  Comprehensive metabolic panel     Status: Abnormal   Collection Time: 03/25/23  6:31 PM  Result Value Ref Range   Sodium 134 (L) 135 - 145 mmol/L   Potassium 2.1 (LL) 3.5 - 5.1 mmol/L   Chloride 86 (L) 98 - 111 mmol/L   CO2 34 (H) 22 - 32 mmol/L   Glucose, Bld 81 70 - 99 mg/dL   BUN 11 8 - 23 mg/dL   Creatinine, Ser 1.32 (H) 0.44 - 1.00 mg/dL   Calcium 7.5 (L) 8.9 - 10.3 mg/dL   Total Protein 5.5 (L) 6.5 - 8.1 g/dL   Albumin 2.0 (L)  3.5 - 5.0 g/dL   AST 24 15 - 41 U/L   ALT 5 0 - 44 U/L   Alkaline Phosphatase 97 38 - 126 U/L   Total Bilirubin 0.8 0.3 - 1.2 mg/dL   GFR, Estimated 48 (L) >60 mL/min   Anion gap 14 5 - 15  Magnesium     Status: None   Collection Time: 03/25/23  6:31 PM  Result Value Ref Range   Magnesium 1.9 1.7 - 2.4 mg/dL  Blood gas, venous     Status: Abnormal   Collection Time: 03/25/23  6:50 PM  Result Value Ref Range   pH, Ven 7.53 (H) 7.25 - 7.43   pCO2, Ven 53 44 - 60 mmHg   pO2, Ven 31 (LL) 32 - 45 mmHg   Bicarbonate 44.3 (H) 20.0 - 28.0 mmol/L   Acid-Base Excess 18.6 (H) 0.0 - 2.0 mmol/L   O2 Saturation 43.8 %   Patient temperature 37.0    Collection site VEIN    Basic Metabolic Panel: Recent Labs  Lab 03/25/23 1831  NA 134*  K 2.1*  CL 86*  CO2 34*  GLUCOSE 81  BUN 11  CREATININE 1.14*  CALCIUM 7.5*  MG 1.9   Liver Function Tests: Recent Labs  Lab 03/25/23 1831  AST 24  ALT 5  ALKPHOS 97  BILITOT 0.8  PROT 5.5*  ALBUMIN 2.0*   No results for input(s): "LIPASE", "AMYLASE" in the last 168 hours. No results for input(s): "AMMONIA" in the last 168 hours. CBC: Recent Labs  Lab 03/25/23 1831  WBC 14.4*  NEUTROABS 11.4*  HGB 10.1*  HCT 32.6*  MCV 90.6  PLT 507*   Cardiac Enzymes: No results for input(s): "CKTOTAL", "CKMB", "CKMBINDEX", "TROPONINIHS" in the last 168 hours.  BNP (last 3 results) No results for input(s): "PROBNP" in the last 8760 hours. CBG: No results for input(s): "  GLUCAP" in the last 168 hours.  Radiological Exams on Admission:  No results found.  EKG: Independently reviewed.  Baseline artifact, suspected sinus rhythm with PAC.   Assessment and Plan: * Hypokalemia Rather severe at 2.1 mEq/L.Marland Kitchen  Patient has had previous episodes of hypokalemia.  Patient has received 40 mEq of IV KCl.  I will give 40 additional orally.  At this time I will just check a repeat KCl level.  Now.  Trend again in the morning.  We will not maintain the patient  on telemetry given CODE STATUS and limited medical interventions.  Risk factor seems to be Lasix use.  Given that patient appears euvolemic at this time, will hold off Lasix till potassium is repleted.  Monitor daily weights and intake and output.  It may be reasonable to restart Lasix if patient develops fluid overload.  Leukocytosis New incidental finding.  Since the last admission CBC.  Given patient's mentation, clinical focus of infection is hard to rule out based on history and exam.  Therefore I will check blood cultures, chest x-ray, and a urinalysis is pending.  Antibiotics will be started if focus of infection is found.  Patient does not meet sepsis criteria at this time      Advance Care Planning:   Code Status: DNR there is DNR order dated February 15, 2023.  There is a MOST form dated January 21, 2023 with limited medication and interventions only.  This was corroborated by patient and daughter.  Consults: None  Family Communication: Daughter at the bedside  Severity of Illness: The appropriate patient status for this patient is INPATIENT. Inpatient status is judged to be reasonable and necessary in order to provide the required intensity of service to ensure the patient's safety. The patient's presenting symptoms, physical exam findings, and initial radiographic and laboratory data in the context of their chronic comorbidities is felt to place them at high risk for further clinical deterioration. Furthermore, it is not anticipated that the patient will be medically stable for discharge from the hospital within 2 midnights of admission.   * I certify that at the point of admission it is my clinical judgment that the patient will require inpatient hospital care spanning beyond 2 midnights from the point of admission due to high intensity of service, high risk for further deterioration and high frequency of surveillance required.*  Author: Nolberto Hanlon, MD 03/25/2023 11:25 PM  For on call  review [email or web address].

## 2023-03-25 NOTE — ED Notes (Signed)
Critical potassium of 2.1 reported to Dr. Derrill Kay, awaiting orders

## 2023-03-26 ENCOUNTER — Other Ambulatory Visit: Payer: Self-pay

## 2023-03-26 DIAGNOSIS — E876 Hypokalemia: Secondary | ICD-10-CM | POA: Diagnosis not present

## 2023-03-26 LAB — BASIC METABOLIC PANEL WITH GFR
Anion gap: 9 (ref 5–15)
BUN: 10 mg/dL (ref 8–23)
CO2: 34 mmol/L — ABNORMAL HIGH (ref 22–32)
Calcium: 7.6 mg/dL — ABNORMAL LOW (ref 8.9–10.3)
Chloride: 93 mmol/L — ABNORMAL LOW (ref 98–111)
Creatinine, Ser: 1.03 mg/dL — ABNORMAL HIGH (ref 0.44–1.00)
GFR, Estimated: 54 mL/min — ABNORMAL LOW (ref >60)
Glucose, Bld: 71 mg/dL (ref 70–99)
Potassium: 3.5 mmol/L (ref 3.5–5.1)
Sodium: 136 mmol/L (ref 135–145)

## 2023-03-26 LAB — POTASSIUM: Potassium: 2.6 mmol/L — CL (ref 3.5–5.1)

## 2023-03-26 LAB — CBC
HCT: 29 % — ABNORMAL LOW (ref 36.0–46.0)
Hemoglobin: 8.9 g/dL — ABNORMAL LOW (ref 12.0–15.0)
MCH: 27.9 pg (ref 26.0–34.0)
MCHC: 30.7 g/dL (ref 30.0–36.0)
MCV: 90.9 fL (ref 80.0–100.0)
Platelets: 426 K/uL — ABNORMAL HIGH (ref 150–400)
RBC: 3.19 MIL/uL — ABNORMAL LOW (ref 3.87–5.11)
RDW: 15.2 % (ref 11.5–15.5)
WBC: 13.1 K/uL — ABNORMAL HIGH (ref 4.0–10.5)
nRBC: 0 % (ref 0.0–0.2)

## 2023-03-26 LAB — APTT: aPTT: 37 s — ABNORMAL HIGH (ref 24–36)

## 2023-03-26 LAB — BLOOD GAS, VENOUS
O2 Saturation: 43.8 %
Patient temperature: 37
pCO2, Ven: 53 mmHg (ref 44–60)
pH, Ven: 7.53 — ABNORMAL HIGH (ref 7.25–7.43)

## 2023-03-26 LAB — PROTIME-INR
INR: 1.2 (ref 0.8–1.2)
Prothrombin Time: 15.4 seconds — ABNORMAL HIGH (ref 11.4–15.2)

## 2023-03-26 MED ORDER — POTASSIUM CHLORIDE CRYS ER 20 MEQ PO TBCR
40.0000 meq | EXTENDED_RELEASE_TABLET | Freq: Once | ORAL | Status: AC
  Administered 2023-03-26: 40 meq via ORAL
  Filled 2023-03-26: qty 2

## 2023-03-26 MED ORDER — GERHARDT'S BUTT CREAM
TOPICAL_CREAM | Freq: Two times a day (BID) | CUTANEOUS | Status: DC
  Filled 2023-03-26: qty 1

## 2023-03-26 MED ORDER — MEDIHONEY WOUND/BURN DRESSING EX PSTE
1.0000 | PASTE | Freq: Every day | CUTANEOUS | Status: DC
  Administered 2023-03-26: 1 via TOPICAL
  Filled 2023-03-26: qty 44

## 2023-03-26 MED ORDER — MEDIHONEY WOUND/BURN DRESSING EX PSTE: 1.0000 | PASTE | Freq: Every day | CUTANEOUS | Status: DC

## 2023-03-26 MED ORDER — GERHARDT'S BUTT CREAM: 1.0000 | TOPICAL_CREAM | Freq: Two times a day (BID) | CUTANEOUS | Status: DC

## 2023-03-26 MED ORDER — FUROSEMIDE 20 MG PO TABS: ORAL_TABLET | ORAL | Status: DC

## 2023-03-26 MED ORDER — POTASSIUM CHLORIDE 10 MEQ/100ML IV SOLN
10.0000 meq | INTRAVENOUS | Status: AC
  Administered 2023-03-26 (×3): 10 meq via INTRAVENOUS
  Filled 2023-03-26 (×3): qty 100

## 2023-03-26 NOTE — Progress Notes (Signed)
Report called Byrd Hesselbach, LPN @ [facility], pt waiting on EMS for transportation

## 2023-03-26 NOTE — Consult Note (Addendum)
WOC Nurse Consult Note: Reason for Consult: multiple wounds back, buttocks, heel Wound type: 1. Pressure  2. Moisture Associated Skin Damage  Pressure Injury POA: Yes Measurement: 1.  Moisture Associated Skin Damage noted to buttocks, perineum, inner thighs with erythema and scattered areas of partial thickness skin loss.  2.  Intertriginous dermatitis noted subpannicular fold with erythema and linear areas of partial thickness skin loss noted Right greater than left  ICD-10 CM Codes for Irritant Dermatitis L24A2 - Due to fecal, urinary or dual incontinence  L30.4  - Erythema intertrigo. Also used for abrasion of the hand, chafing of the skin, dermatitis due to sweating and friction, friction dermatitis, friction eczema, and genital/thigh intertrigo.  3.  Unstageable Pressure Injuries noted as follows:  R medial heel 0.8 cm x 0.8 cm 100% slough, 2 cm x 2 cm sacrum 75% yellow slough 25% pink and moist; R upper back 0.5 cm x 0.5 cm 100% yellow slough, distal to this area R mid back 1 cm x 2 cm 100% yellow slough  4.  Stage 3 Pressure Injury L thumb 1 cm x 1 cm x 0.1 cm 75% pink and moist 25% yellow  5.  Stage 2 Pressure Injury L buttock 1 cm x 1 cm x 0.1 cm 100% pink and moist   Drainage (amount, consistency, odor) minimal tan from Unstageable Pressure Injuries; serosanguinous from L thumb and L buttock  Periwound: R upper back with 5 cm x 3 cm total area of erythema and partial thickness skin loss surrounding unstageable PI; 8 cm x 8 cm total area of erythema, peeling skin surrounding Sacral unstageable and L buttock Stage 2 Pressure Injury Dressing procedure/placement/frequency:  Clean Unstageable Pressure Injuries R medial heel, Sacrum, R upper back and R mid back with NS, apply Medihoney to wound beds daily and cover with silicone foam.  May lift silicone foam to reapply Medihoney daily.  Change foam dressings q3 days and prn soiling.  Cover Stage 2 to Left buttock and Stage 3 to left thumb with  silicone foam.  Lift foam dressing daily to assess area.  Change foam dressing q3 days and prn soiling.  Clean beneath abdominal pannus with soap and water, dry thoroughly and apply floor stock (Microguard) antifungal powder to area 3 times daily.  Clean buttocks, perineum and inner thighs with soap and water, dry thoroughly and apply Gerhardt's Butt Cream to entire area 2 times daily and prn soiling.    Patient placed in Prevalon boots at time of this visit.  Also ordered low air loss mattress for pressure redistribution and moisture management. Patient is contracted in her arms and L  hand (keeps tightly clenched).   Also keeps legs crossed and very stiff.    POC discussed with bedside nurse. WOC team will not follow. Re-consult if further needs arise.   Thank you,    Priscella Mann MSN, RN-BC, [employer] [phone]

## 2023-03-26 NOTE — Plan of Care (Signed)

## 2023-03-26 NOTE — TOC Transition Note (Signed)
Transition of Care [facility]) - CM/SW Discharge Note   Patient Details  Name: Mikayla [name] MRN: [id number] Date of Birth: 09-Dec-1940  Transition of Care [facility]) CM/SW Contact:  Garret Reddish, RN Phone Number: 03/26/2023, 1:03 PM   Clinical Narrative:   Chart reviewed.  Noted that patient has orders for discharge today.  Noted that patient is a long term care resident at [facility].    I have spoken with [name], Admission Coordinator at [facility]  She reports that patient is able to return.  [name] reports that she will only need Discharge Summary.  Discharge Summary sent to [facility].  [name] reports that patient will be going to room 111P and the number to call report is [phone].    I have informed patient's daughter Mrs. Alfredo Bach that patient will be returning back to [facility] today. I have informed Mrs. Alfredo Bach that [facility] will transport patient back to the facility today.    I have informed staff nurse of the above information.      Final next level of care: Long Term Nursing Home Barriers to Discharge: No Barriers Identified   Patient Goals and CMS Choice      Discharge Placement                Patient chooses bed at:  (Patient is a long tern care patient at [facility]) Patient to be transferred to facility by: [facility] Name of family member notified: [place] Patient and family notified of of transfer: 03/26/23  Discharge Plan and Services Additional resources added to the After Visit Summary for                                       Social Determinants of Health (SDOH) Interventions SDOH Screenings   Food Insecurity: No Food Insecurity (03/26/2023)  Housing: Patient Declined (03/26/2023)  Transportation Needs: No Transportation Needs (03/26/2023)  Utilities: Not At Risk (03/26/2023)  Tobacco Use: Medium Risk (02/09/2023)     Readmission Risk Interventions     No data to display

## 2023-03-26 NOTE — Hospital Course (Signed)
Mikayla [name] is a 82 y.o. female with medical history significant of Parkinson disease with associated dementia, COPD, Gout, HTN, Afib, CVA w/ L-sided deficits. Patient is on chronic 3 L/min of supplemental oxygen per daughter. Baseline, patient is bedbound due to her dementia and Parkinson disease as well as stroke which has caused severe left-sided weakness.  Patient does not communicate much, and requires frequent orientation to her location. Few days ago, nonspecific generalized weakness was noted, had a CBC and a BMP checked and was found to have severe hypokalemia for which patient was transferred here to [facility] ER 03/25/2023.  05/30: K 2.1. Repleted IV and po. Hold lasix.  05/31: K 3.5.   Consultants:  none  Procedures: none      ASSESSMENT & PLAN:   Principal Problem:   Hypokalemia Active Problems:   Leukocytosis   Hypokalemia Rather severe at 2.1 mEq/L on admission   Patient has had previous episodes of hypokalemia.  Risk factor seems to be Lasix use, pt is also on po supplementation for potassium as well which may have to be increased on discharge  will not maintain the patient on telemetry given CODE STATUS and limited medical interventions.  Given that patient appears euvolemic at this time, will hold off Lasix till potassium is repleted.    Leukocytosis Sepsis r/o blood cultures --> chest x-ray --> no concerns urinalysis -->   Stable/chronic conditions:  Gout - cont allopurinol  HTN, Afib - cont  amiodarone Hx CVA - no anticoags or antiplatelets, cont SSRI CHF - hold lasix for now as above, Monitor daily weights and intake and output. Parkinsons - cont Sinemet COPD - cont 2L baseline O2, bronchodilators prn GERD - cont PPI Malnutrition, severe - cont feeding supplement Pressure injury POA - skin care routine    DVT prophylaxis: lovenox  Pertinent IV fluids/nutrition: no continuous IV fluids  Central lines / invasive devices: none  Code Status: DNR  order dated February 15, 2023.  There is a MOST form dated January 21, 2023 with limited medication and interventions only.  This was corroborated by patient and daughter on admission by Dr Maryjean Ka.  ACP documentation reviewed: 03/26/23   Current Admission Status: inpatient   TOC needs / Dispo plan: plan back to long term SNF  Barriers to discharge / significant pending items: stabilization of potassium levels

## 2023-03-26 NOTE — Progress Notes (Signed)
Pt has critical lab of potassium 2.6. Duncan Hazel/Hospitalist made aware. New orders received. Will continue to monitor pt.

## 2023-03-26 NOTE — Plan of Care (Signed)
  Problem: Elimination: Goal: Will not experience complications related to bowel motility Outcome: Progressing Goal: Will not experience complications related to urinary retention Outcome: Progressing   Problem: Pain Managment: Goal: General experience of comfort will improve Outcome: Progressing   

## 2023-03-26 NOTE — Discharge Summary (Signed)
Physician Discharge Summary   Patient: Mikayla [name] MRN: [id number]  DOB: November 28, 1940   Admit:     Date of Admission: 03/25/2023 Admitted from: SNF long term care    Discharge: Date of discharge: 03/26/23 Disposition: Skilled nursing facility Condition at discharge: good  CODE STATUS: DNR     Discharge Physician: Sunnie Nielsen, DO [facility]     PCP: Patient, No Pcp Per  Recommendations for Outpatient Follow-up:  Follow up with PCP Patient, No Pcp Per in 1-2 weeks Please obtain labs/tests: BMP in 2-3 days to monitor potassium Please follow up on the following pending results: blood cultures, do not expect these to be positive  PCP AND OTHER OUTPATIENT PROVIDERS: SEE BELOW FOR SPECIFIC DISCHARGE INSTRUCTIONS PRINTED FOR PATIENT IN ADDITION TO GENERIC AVS PATIENT INFO    Discharge Instructions     (HEART FAILURE PATIENTS) Call MD:  Anytime you have any of the following symptoms: 1) 3 pound weight gain in 24 hours or 5 pounds in 1 week 2) shortness of breath, with or without a dry hacking cough 3) swelling in the hands, feet or stomach 4) if you have to sleep on extra pillows at night in order to breathe.   Complete by: As directed    Diet - low sodium heart healthy   Complete by: As directed    Discharge instructions   Complete by: As directed    LASIX AS NEEDED - SEE MED REC, DO NOT GIVE THIS MEDICATION SCHEDULED DAILY  CHECK BMP IN 2-3 DAYS TO MONITOR POTASSIUM - ADJUST SUPPLEMENTATION AS NEEDED   Discharge wound care:   Complete by: As directed    03/26/23 1800    Wound care  2 times daily      Comments: 1. Clean beneath abdominal pannus with soap and water, dry thoroughly and apply floor stock (Microguard) antifungal powder to area 2 times daily.  2.  Clean buttocks, perineum and inner thighs with soap and water, dry thoroughly and apply Gerhardt's Butt Cream to entire area 2 times daily and prn soiling.  03/26/23 0917    03/26/23 0918    Wound  care  Daily      Comments: 1.         Clean Unstageable Pressure Injuries R medial heel, Sacrum, R upper back and R mid back with NS, apply Medihoney to wound beds daily and cover with silicone foam.  May lift silicone foam to reapply Medihoney daily.  Change foam dressings q3 days and prn soiling.  2.         Cover Stage 2 to Left buttock and Stage 3 to left thumb with silicone foam.  Lift foam dressing daily to assess area.  Change foam dressing q3 days and prn soiling.  03/26/23 0917   Increase activity slowly   Complete by: As directed          Discharge Diagnoses: Principal Problem:   Hypokalemia Active Problems:   Leukocytosis       Hospital Course: Mikayla [name] is a 82 y.o. female with medical history significant of Parkinson disease with associated dementia, COPD, Gout, HTN, Afib, CVA w/ L-sided deficits. Patient is on chronic 3 L/min of supplemental oxygen per daughter. Baseline, patient is bedbound due to her dementia and Parkinson disease as well as stroke which has caused severe left-sided weakness.  Patient does not communicate much, and requires frequent orientation to her location. Few days ago, nonspecific generalized weakness was noted, had a  CBC and a BMP checked and was found to have severe hypokalemia for which patient was transferred here to [facility] ER 03/25/2023.  05/30: K 2.1. Repleted IV and po. Hold lasix.  05/31: K 3.5.   Consultants:  none  Procedures: none      ASSESSMENT & PLAN:   Hypokalemia [name] severe at 2.1 mEq/L on admission   Patient has had previous episodes of hypokalemia.  Risk factor seems to be Lasix use, pt is also on po supplementation for potassium as well which may have to be increased on discharge  will not maintain the patient on telemetry given CODE STATUS and limited medical interventions.  Given that patient appears euvolemic at this time, will hold off Lasix till potassium is repleted.    Leukocytosis Sepsis  r/o blood cultures --> pending chest x-ray --> no concerns urinalysis --> can cancel, pt is colonized, no symptoms   Stable/chronic conditions:  Gout - cont allopurinol  HTN, Afib - cont  amiodarone Hx CVA - no anticoags or antiplatelets, cont SSRI CHF - hold lasix for now as above, prn outpatient  Parkinsons - cont Sinemet COPD - cont 2L baseline O2, bronchodilators prn GERD - cont PPI Malnutrition, severe - cont feeding supplement Pressure injury POA - skin care routine            Discharge Instructions  Allergies as of 03/26/2023       Reactions   Levaquin [levofloxacin]    Other Nausea And Vomiting   Sulfa Antibiotics Hives   Oxycodone Anxiety, Other (See Comments)        Medication List     TAKE these medications    allopurinol 100 MG tablet Commonly known as: ZYLOPRIM Take 100 mg by mouth daily.   amiodarone 200 MG tablet Commonly known as: PACERONE Take 1 tablet (200 mg total) by mouth daily.   ammonium lactate 12 % cream Commonly known as: AMLACTIN Apply 1 application topically as needed for dry skin.   ascorbic acid 500 MG tablet Commonly known as: VITAMIN C Take 1 tablet (500 mg total) by mouth 2 (two) times daily.   carbidopa-levodopa 25-100 MG tablet Commonly known as: SINEMET IR Take 1 tablet by mouth 3 (three) times daily. 0900/1300/2100   CENTROVITE Tabs Take 1 tablet by mouth daily.   Healthy Eyes Supervision 2 Caps Take 1 capsule by mouth in the morning and at bedtime. 0800/1800   Combivent Respimat 20-100 MCG/ACT Aers respimat Generic drug: Ipratropium-Albuterol Inhale 1 puff into the lungs 3 (three) times daily.   ipratropium-albuterol 0.5-2.5 (3) MG/3ML Soln Commonly known as: DUONEB Take 3 mLs by nebulization every 6 (six) hours as needed.   feeding supplement Liqd Take 237 mLs by mouth 3 (three) times daily between meals.   ferrous sulfate 324 (65 Fe) MG Tbec Take 1 tablet by mouth daily. Only Monday and thursday    furosemide 20 MG tablet Commonly known as: LASIX Take to 1 tablet (20 mg total) by mouth ONCE OR TWICE daily (total daily dose 40 mg MAXIMUM) as needed for up to 3 days for increased leg swelling, shortness of breath, weight gain 5+ lbs over 1-2 days. Seek medical care if these symptoms are not improving in 1-3 days. What changed:  how much to take how to take this when to take this additional instructions   gabapentin 100 MG capsule Commonly known as: NEURONTIN Take 100 mg by mouth at bedtime.   Gerhardt's butt cream Crea Apply 1 Application topically 2 (two)  times daily.   guaiFENesin 600 MG 12 hr tablet Commonly known as: MUCINEX Take 600 mg by mouth 2 (two) times daily as needed for cough.   leptospermum manuka honey Pste paste Apply 1 Application topically daily. Start taking on: March 27, 2023   nystatin powder Generic drug: nystatin Apply 1 Application topically 2 (two) times daily.   omeprazole 20 MG tablet Commonly known as: PRILOSEC OTC Take 20 mg by mouth 2 (two) times daily.   OXYGEN Inhale 2 L/min into the lungs continuous.   PARoxetine 30 MG tablet Commonly known as: PAXIL Take 30 mg by mouth daily.   Pataday 0.7 % Soln Generic drug: Olopatadine HCl Place 1 drop into both eyes daily.   potassium chloride SA 20 MEQ tablet Commonly known as: KLOR-CON M Take 20 mEq by mouth 2 (two) times daily.   senna 8.6 MG Tabs tablet Commonly known as: SENOKOT Take 2 tablets by mouth at bedtime.   zinc oxide 20 % ointment Apply 1 application topically every 8 (eight) hours. To groin and thighs each shift               Discharge Care Instructions  (From admission, onward)           Start     Ordered   03/26/23 0000  Discharge wound care:       Comments: 03/26/23 1800    Wound care  2 times daily      Comments: 1. Clean beneath abdominal pannus with soap and water, dry thoroughly and apply floor stock (Microguard) antifungal powder to area 2 times  daily.  2.  Clean buttocks, perineum and inner thighs with soap and water, dry thoroughly and apply Gerhardt's Butt Cream to entire area 2 times daily and prn soiling.  03/26/23 0917    03/26/23 0918    Wound care  Daily      Comments: 1.         Clean Unstageable Pressure Injuries R medial heel, Sacrum, R upper back and R mid back with NS, apply Medihoney to wound beds daily and cover with silicone foam.  May lift silicone foam to reapply Medihoney daily.  Change foam dressings q3 days and prn soiling.  2.         Cover Stage 2 to Left buttock and Stage 3 to left thumb with silicone foam.  Lift foam dressing daily to assess area.  Change foam dressing q3 days and prn soiling.  03/26/23 0917   03/26/23 1046              Allergies  Allergen Reactions   Levaquin [Levofloxacin]    Other Nausea And Vomiting   Sulfa Antibiotics Hives   Oxycodone Anxiety and Other (See Comments)     Subjective: pt reports feeling fine today. No concerns. Daughter is at bedside, reports pt seems better today. No CP/SOB. No pain.    Discharge Exam: BP 134/75   Pulse 89   Temp 98.1 F (36.7 C)   Resp 20   Wt 64.4 kg   SpO2 100%   BMI 25.15 kg/m  General: Pt is alert, awake, not in acute distress Cardiovascular: RRR, S1/S2 +, no rubs, no gallops Respiratory: CTA bilaterally, diminished breath sounds/effort  Abdominal: Soft, NT, ND, bowel sounds Extremities: no edema, no cyanosis     The results of significant diagnostics from this hospitalization (including imaging, microbiology, ancillary and laboratory) are listed below for reference.     Microbiology: No results found  for this or any previous visit (from the past 240 hour(s)).   Labs: BNP (last 3 results) No results for input(s): "BNP" in the last 8760 hours. Basic Metabolic Panel: Recent Labs  Lab 03/25/23 1831 03/26/23 0140 03/26/23 0547  NA 134*  --  136  K 2.1* 2.6* 3.5  CL 86*  --  93*  CO2 34*  --  34*  GLUCOSE 81   --  71  BUN 11  --  10  CREATININE 1.14*  --  1.03*  CALCIUM 7.5*  --  7.6*  MG 1.9  --   --    Liver Function Tests: Recent Labs  Lab 03/25/23 1831  AST 24  ALT 5  ALKPHOS 97  BILITOT 0.8  PROT 5.5*  ALBUMIN 2.0*   No results for input(s): "LIPASE", "AMYLASE" in the last 168 hours. No results for input(s): "AMMONIA" in the last 168 hours. CBC: Recent Labs  Lab 03/25/23 1831 03/26/23 0547  WBC 14.4* 13.1*  NEUTROABS 11.4*  --   HGB 10.1* 8.9*  HCT 32.6* 29.0*  MCV 90.6 90.9  PLT 507* 426*   Cardiac Enzymes: No results for input(s): "CKTOTAL", "CKMB", "CKMBINDEX", "TROPONINI" in the last 168 hours. BNP: Invalid input(s): "POCBNP" CBG: No results for input(s): "GLUCAP" in the last 168 hours. D-Dimer No results for input(s): "DDIMER" in the last 72 hours. Hgb A1c No results for input(s): "HGBA1C" in the last 72 hours. Lipid Profile No results for input(s): "CHOL", "HDL", "LDLCALC", "TRIG", "CHOLHDL", "LDLDIRECT" in the last 72 hours. Thyroid function studies No results for input(s): "TSH", "T4TOTAL", "T3FREE", "THYROIDAB" in the last 72 hours.  Invalid input(s): "FREET3" Anemia work up No results for input(s): "VITAMINB12", "FOLATE", "FERRITIN", "TIBC", "IRON", "RETICCTPCT" in the last 72 hours. Urinalysis    Component Value Date/Time   COLORURINE YELLOW (A) 02/10/2023 0006   APPEARANCEUR CLOUDY (A) 02/10/2023 0006   LABSPEC 1.012 02/10/2023 0006   PHURINE 5.0 02/10/2023 0006   GLUCOSEU NEGATIVE 02/10/2023 0006   HGBUR SMALL (A) 02/10/2023 0006   BILIRUBINUR NEGATIVE 02/10/2023 0006   KETONESUR 5 (A) 02/10/2023 0006   PROTEINUR NEGATIVE 02/10/2023 0006   NITRITE NEGATIVE 02/10/2023 0006   LEUKOCYTESUR LARGE (A) 02/10/2023 0006   Sepsis Labs Recent Labs  Lab 03/25/23 1831 03/26/23 0547  WBC 14.4* 13.1*   Microbiology No results found for this or any previous visit (from the past 240 hour(s)). Imaging DG Chest Port 1 View  Result Date:  03/25/2023 CLINICAL DATA:  Pneumonia EXAM: PORTABLE CHEST 1 VIEW COMPARISON:  02/10/2023 FINDINGS: Right fifth rib thoracotomy defect and right-sided volume loss suggesting partial right lung resection are unchanged. No superimposed focal pulmonary infiltrate. No pneumothorax or pleural effusion. Cardiac size is within normal limits. Unchanged mediastinal shift to the right. Pulmonary vascularity is normal. Degenerative changes are seen within the left shoulder. No acute bone abnormality. Embolization coils incidentally noted within the upper abdomen. IMPRESSION: 1. No radiographic evidence of acute cardiopulmonary disease. Electronically Signed   By: Helyn Numbers M.D.   On: 03/25/2023 23:55      Time coordinating discharge: over 30 minutes  SIGNED:  Sunnie Nielsen DO [facility]

## 2023-03-27 LAB — CULTURE, BLOOD (ROUTINE X 2)

## 2023-03-29 LAB — CULTURE, BLOOD (ROUTINE X 2)
Culture: NO GROWTH
Special Requests: ADEQUATE

## 2023-03-30 LAB — CULTURE, BLOOD (ROUTINE X 2)

## 2023-03-31 ENCOUNTER — Non-Acute Institutional Stay: Payer: Medicare PPO | Admitting: Nurse Practitioner

## 2023-03-31 DIAGNOSIS — G20A1 Parkinson's disease without dyskinesia, without mention of fluctuations: Secondary | ICD-10-CM

## 2023-03-31 DIAGNOSIS — Z515 Encounter for palliative care: Secondary | ICD-10-CM

## 2023-03-31 DIAGNOSIS — R634 Abnormal weight loss: Secondary | ICD-10-CM

## 2023-03-31 DIAGNOSIS — R630 Anorexia: Secondary | ICD-10-CM

## 2023-03-31 LAB — CULTURE, BLOOD (ROUTINE X 2): Culture: NO GROWTH

## 2023-03-31 NOTE — Progress Notes (Signed)
Therapist, nutritional Palliative Care Consult Note Telephone: [phone]  Fax: [phone]    Date of encounter: 03/31/23 9:04 PM PATIENT NAME: Evia Bardos [place] [address] [address] [place] [address]   [phone] (home)  DOB: 1941-10-13 MRN: [id number] PRIMARY CARE PROVIDER:    [facility]  RESPONSIBLE PARTY:    Contact Information     Name Relation Home Work Mobile   [place] Daughter [phone]     Susa Loffler [facility]) Daughter [phone]       I met face to face with patient in facility. Palliative Care was asked to follow this patient by consultation request of  Clovis Cao, MD/[facility]  to address advance care planning and complex medical decision making. This is a follow up visit.                                 ASSESSMENT AND PLAN / RECOMMENDATIONS:  Symptom Management/Plan: 1. Advance Care Planning;  DNR 2. Goals of Care: Goals include to maximize quality of life and syPalliative care encounter; Palliative care encounter; Palliative medicine team will continue to support patient, patient's family, and medical team. Visit consisted of counseling and education dealing with the complex and emotionally intense issues of symptom management and palliative care in the setting of serious and potentially life-threatening illness   3. Weight loss; anorexia reviewed weights, ongoing poor appetite, overall decline, encourage Ms Bellomo to eat, supplements with now wound. Continue to monitor weights. Will need further discussions with family with focus on goc, continue wound care  10/27/2022 weight 167.6 lbs 11/26/2022 weight 165.6 lbs 03/02/2023 weight 146.6 lbs  Follow up Palliative Care Visit: PC f/u visit further discussion monitor trends of appetite, weights, monitor for functional, cognitive decline with chronic disease progression, assess any active symptoms, supportive role. Palliative care will continue to follow for  complex medical decision making, advance care planning, and clarification of goals. Return 4 to 8  weeks or prn. I spent 46 minutes providing this consultation. More than 50% of the time in this consultation was spent in counseling and care coordination. PPS: 30% Chief Complaint: Follow up palliative consult for complex medical decision making, address goals, manage ongoing symptoms   HISTORY OF PRESENT ILLNESS:  EDAN JOHNIGAN is a 82 y.o. year old female  with multiple medical problems including Parkinson disease, emphysema, atrial fibrillation, hypertension, obesity.  Ms Dietrick resides LTC at [facility] where she currently remains. Ms Sweeden is total care, turning, positioning, bathing, dressing. Ms Magana requires assistance with feeding, declined appetite ongoing weight loss. At present Ms Dallman is lying in bed, appears severely debilitated, sleepy. Ms Grimaldo does open her eyes, making brief eye contact. Attempted ros though limited with cognitive. Ms Gardin had her diet coke, assisted with. Careplan meeting today with family due to overall decline progressive. Ms Mckinnie did go back to sleep with pc visit, cooperative. Support provided, attempted to contact [place]. Medications, weights, goc, poc reviewed. PC f/u visit further discussion monitor trends of appetite, weights, monitor for functional, cognitive decline with chronic disease progression, assess any active symptoms, supportive role. Updated staff.   History obtained from review of EMR, discussion with primary team, and interview with family, facility staff/caregiver and/or Ms. Haeberle.  I reviewed available labs, medications, imaging, studies and related documents from the EMR.  Records reviewed and summarized above.    Physical Exam: General: frail  appearing, obese, chronically ill, debilitated female ENMT: oral mucous membranes moist CV: S1S2, RRR, +generalized edema Pulmonary: decrease breathsounds throughout, O2  supplemental Neuro:  + generalized weakness,  + cognitive impairment Psych: oriented to self Thank you for the opportunity to participate in the care of Ms. Gerhold. Please call our office at [phone] if we can be of additional assistance.   Othell Jaime Prince [place], NP

## 2023-04-17 ENCOUNTER — Inpatient Hospital Stay
Admission: EM | Admit: 2023-04-17 | Discharge: 2023-04-26 | DRG: 951 | Disposition: E | Payer: Medicare PPO | Source: Skilled Nursing Facility | Attending: Student in an Organized Health Care Education/Training Program | Admitting: Student in an Organized Health Care Education/Training Program

## 2023-04-17 ENCOUNTER — Other Ambulatory Visit: Payer: Self-pay

## 2023-04-17 ENCOUNTER — Encounter: Payer: Self-pay | Admitting: Radiology

## 2023-04-17 ENCOUNTER — Emergency Department: Payer: Medicare PPO

## 2023-04-17 DIAGNOSIS — G20A1 Parkinson's disease without dyskinesia, without mention of fluctuations: Secondary | ICD-10-CM | POA: Diagnosis present

## 2023-04-17 DIAGNOSIS — G4700 Insomnia, unspecified: Secondary | ICD-10-CM | POA: Diagnosis present

## 2023-04-17 DIAGNOSIS — Z7189 Other specified counseling: Secondary | ICD-10-CM | POA: Diagnosis not present

## 2023-04-17 DIAGNOSIS — R532 Functional quadriplegia: Secondary | ICD-10-CM | POA: Diagnosis present

## 2023-04-17 DIAGNOSIS — M1990 Unspecified osteoarthritis, unspecified site: Secondary | ICD-10-CM | POA: Diagnosis present

## 2023-04-17 DIAGNOSIS — Z1152 Encounter for screening for COVID-19: Secondary | ICD-10-CM | POA: Diagnosis not present

## 2023-04-17 DIAGNOSIS — I700 Atherosclerosis of aorta: Secondary | ICD-10-CM | POA: Diagnosis present

## 2023-04-17 DIAGNOSIS — L89159 Pressure ulcer of sacral region, unspecified stage: Secondary | ICD-10-CM | POA: Diagnosis present

## 2023-04-17 DIAGNOSIS — I4891 Unspecified atrial fibrillation: Secondary | ICD-10-CM | POA: Diagnosis present

## 2023-04-17 DIAGNOSIS — Z885 Allergy status to narcotic agent status: Secondary | ICD-10-CM

## 2023-04-17 DIAGNOSIS — Z2239 Carrier of other specified bacterial diseases: Secondary | ICD-10-CM

## 2023-04-17 DIAGNOSIS — N183 Chronic kidney disease, stage 3 unspecified: Secondary | ICD-10-CM | POA: Diagnosis present

## 2023-04-17 DIAGNOSIS — Z882 Allergy status to sulfonamides status: Secondary | ICD-10-CM

## 2023-04-17 DIAGNOSIS — K219 Gastro-esophageal reflux disease without esophagitis: Secondary | ICD-10-CM | POA: Diagnosis present

## 2023-04-17 DIAGNOSIS — N390 Urinary tract infection, site not specified: Secondary | ICD-10-CM | POA: Diagnosis present

## 2023-04-17 DIAGNOSIS — Z9981 Dependence on supplemental oxygen: Secondary | ICD-10-CM

## 2023-04-17 DIAGNOSIS — D631 Anemia in chronic kidney disease: Secondary | ICD-10-CM | POA: Diagnosis present

## 2023-04-17 DIAGNOSIS — J9610 Chronic respiratory failure, unspecified whether with hypoxia or hypercapnia: Secondary | ICD-10-CM | POA: Diagnosis present

## 2023-04-17 DIAGNOSIS — Z515 Encounter for palliative care: Secondary | ICD-10-CM | POA: Diagnosis present

## 2023-04-17 DIAGNOSIS — I129 Hypertensive chronic kidney disease with stage 1 through stage 4 chronic kidney disease, or unspecified chronic kidney disease: Secondary | ICD-10-CM | POA: Diagnosis present

## 2023-04-17 DIAGNOSIS — A419 Sepsis, unspecified organism: Secondary | ICD-10-CM | POA: Diagnosis present

## 2023-04-17 DIAGNOSIS — N1831 Chronic kidney disease, stage 3a: Secondary | ICD-10-CM | POA: Diagnosis present

## 2023-04-17 DIAGNOSIS — F0280 Dementia in other diseases classified elsewhere without behavioral disturbance: Secondary | ICD-10-CM | POA: Diagnosis present

## 2023-04-17 DIAGNOSIS — Z66 Do not resuscitate: Secondary | ICD-10-CM | POA: Diagnosis present

## 2023-04-17 DIAGNOSIS — Z881 Allergy status to other antibiotic agents status: Secondary | ICD-10-CM | POA: Diagnosis not present

## 2023-04-17 DIAGNOSIS — F0283 Dementia in other diseases classified elsewhere, unspecified severity, with mood disturbance: Secondary | ICD-10-CM | POA: Diagnosis present

## 2023-04-17 DIAGNOSIS — R6510 Systemic inflammatory response syndrome (SIRS) of non-infectious origin without acute organ dysfunction: Secondary | ICD-10-CM

## 2023-04-17 DIAGNOSIS — J449 Chronic obstructive pulmonary disease, unspecified: Secondary | ICD-10-CM | POA: Diagnosis present

## 2023-04-17 DIAGNOSIS — F329 Major depressive disorder, single episode, unspecified: Secondary | ICD-10-CM | POA: Diagnosis present

## 2023-04-17 DIAGNOSIS — Z8249 Family history of ischemic heart disease and other diseases of the circulatory system: Secondary | ICD-10-CM

## 2023-04-17 DIAGNOSIS — Z87891 Personal history of nicotine dependence: Secondary | ICD-10-CM | POA: Diagnosis not present

## 2023-04-17 DIAGNOSIS — I480 Paroxysmal atrial fibrillation: Secondary | ICD-10-CM | POA: Diagnosis present

## 2023-04-17 DIAGNOSIS — Z79899 Other long term (current) drug therapy: Secondary | ICD-10-CM

## 2023-04-17 DIAGNOSIS — Z888 Allergy status to other drugs, medicaments and biological substances status: Secondary | ICD-10-CM

## 2023-04-17 DIAGNOSIS — L89154 Pressure ulcer of sacral region, stage 4: Secondary | ICD-10-CM | POA: Diagnosis present

## 2023-04-17 DIAGNOSIS — L89892 Pressure ulcer of other site, stage 2: Secondary | ICD-10-CM | POA: Diagnosis present

## 2023-04-17 LAB — COMPREHENSIVE METABOLIC PANEL
ALT: 7 U/L (ref 0–44)
AST: 44 U/L — ABNORMAL HIGH (ref 15–41)
Albumin: 1.8 g/dL — ABNORMAL LOW (ref 3.5–5.0)
Alkaline Phosphatase: 101 U/L (ref 38–126)
Anion gap: 9 (ref 5–15)
BUN: 25 mg/dL — ABNORMAL HIGH (ref 8–23)
CO2: 21 mmol/L — ABNORMAL LOW (ref 22–32)
Calcium: 8.1 mg/dL — ABNORMAL LOW (ref 8.9–10.3)
Chloride: 101 mmol/L (ref 98–111)
Creatinine, Ser: 1.3 mg/dL — ABNORMAL HIGH (ref 0.44–1.00)
GFR, Estimated: 41 mL/min — ABNORMAL LOW (ref >60)
Glucose, Bld: 138 mg/dL — ABNORMAL HIGH (ref 70–99)
Potassium: 4.7 mmol/L (ref 3.5–5.1)
Sodium: 131 mmol/L — ABNORMAL LOW (ref 135–145)
Total Bilirubin: 0.7 mg/dL (ref 0.3–1.2)
Total Protein: 5.3 g/dL — ABNORMAL LOW (ref 6.5–8.1)

## 2023-04-17 LAB — CBC WITH DIFFERENTIAL/PLATELET
Abs Immature Granulocytes: 0.31 10*3/uL — ABNORMAL HIGH (ref 0.00–0.07)
Basophils Absolute: 0.1 10*3/uL (ref 0.0–0.1)
Basophils Relative: 0 %
Eosinophils Absolute: 0 10*3/uL (ref 0.0–0.5)
Eosinophils Relative: 0 %
HCT: 34.8 % — ABNORMAL LOW (ref 36.0–46.0)
Hemoglobin: 10.7 g/dL — ABNORMAL LOW (ref 12.0–15.0)
Immature Granulocytes: 1 %
Lymphocytes Relative: 5 %
Lymphs Abs: 1.4 10*3/uL (ref 0.7–4.0)
MCH: 28.8 pg (ref 26.0–34.0)
MCHC: 30.7 g/dL (ref 30.0–36.0)
MCV: 93.5 fL (ref 80.0–100.0)
Monocytes Absolute: 0.8 10*3/uL (ref 0.1–1.0)
Monocytes Relative: 3 %
Neutro Abs: 24.9 10*3/uL — ABNORMAL HIGH (ref 1.7–7.7)
Neutrophils Relative %: 91 %
Platelets: 163 10*3/uL (ref 150–400)
RBC: 3.72 MIL/uL — ABNORMAL LOW (ref 3.87–5.11)
RDW: 17.4 % — ABNORMAL HIGH (ref 11.5–15.5)
Smear Review: NORMAL
WBC: 27.5 10*3/uL — ABNORMAL HIGH (ref 4.0–10.5)
nRBC: 0 % (ref 0.0–0.2)

## 2023-04-17 LAB — RESP PANEL BY RT-PCR (RSV, FLU A&B, COVID)  RVPGX2
Influenza A by PCR: NEGATIVE
Influenza B by PCR: NEGATIVE
Resp Syncytial Virus by PCR: NEGATIVE
SARS Coronavirus 2 by RT PCR: NEGATIVE

## 2023-04-17 LAB — LACTIC ACID, PLASMA: Lactic Acid, Venous: 3.7 mmol/L (ref 0.5–1.9)

## 2023-04-17 MED ORDER — LACTATED RINGERS IV SOLN: INTRAVENOUS | Status: DC

## 2023-04-17 MED ORDER — MORPHINE SULFATE (CONCENTRATE) 10 MG/0.5ML PO SOLN: 5.0000 mg | ORAL | Status: DC | PRN

## 2023-04-17 MED ORDER — SODIUM CHLORIDE 0.9 % IV SOLN: INTRAVENOUS | Status: DC

## 2023-04-17 MED ORDER — BIOTENE DRY MOUTH MT LIQD: 15.0000 mL | OROMUCOSAL | Status: DC | PRN

## 2023-04-17 MED ORDER — SODIUM CHLORIDE 0.9 % IV SOLN
2.0000 g | Freq: Two times a day (BID) | INTRAVENOUS | Status: DC
  Filled 2023-04-17: qty 12.5

## 2023-04-17 MED ORDER — LORAZEPAM 2 MG PO TABS: 2.0000 mg | ORAL_TABLET | ORAL | Status: DC | PRN

## 2023-04-17 MED ORDER — HALOPERIDOL 0.5 MG PO TABS: 0.5000 mg | ORAL_TABLET | ORAL | Status: DC | PRN

## 2023-04-17 MED ORDER — MORPHINE SULFATE (PF) 2 MG/ML IV SOLN
2.0000 mg | INTRAVENOUS | Status: DC | PRN
  Administered 2023-04-18 – 2023-04-20 (×13): 2 mg via INTRAVENOUS
  Filled 2023-04-17 (×13): qty 1

## 2023-04-17 MED ORDER — METRONIDAZOLE 500 MG/100ML IV SOLN
500.0000 mg | Freq: Once | INTRAVENOUS | Status: AC
  Administered 2023-04-17: 500 mg via INTRAVENOUS
  Filled 2023-04-17: qty 100

## 2023-04-17 MED ORDER — GLYCOPYRROLATE 0.2 MG/ML IJ SOLN: 0.2000 mg | INTRAMUSCULAR | Status: DC | PRN

## 2023-04-17 MED ORDER — ONDANSETRON HCL 4 MG/2ML IJ SOLN: 4.0000 mg | Freq: Four times a day (QID) | INTRAMUSCULAR | Status: DC | PRN

## 2023-04-17 MED ORDER — ONDANSETRON 4 MG PO TBDP: 4.0000 mg | ORAL_TABLET | Freq: Four times a day (QID) | ORAL | Status: DC | PRN

## 2023-04-17 MED ORDER — HALOPERIDOL LACTATE 5 MG/ML IJ SOLN: 0.5000 mg | INTRAMUSCULAR | Status: DC | PRN

## 2023-04-17 MED ORDER — HALOPERIDOL LACTATE 2 MG/ML PO CONC: 0.5000 mg | ORAL | Status: DC | PRN

## 2023-04-17 MED ORDER — GLYCOPYRROLATE 0.2 MG/ML IJ SOLN
0.2000 mg | INTRAMUSCULAR | Status: DC | PRN
  Administered 2023-04-20: 0.2 mg via INTRAVENOUS
  Filled 2023-04-17: qty 1

## 2023-04-17 MED ORDER — LORAZEPAM 2 MG/ML PO CONC: 2.0000 mg | ORAL | Status: DC | PRN

## 2023-04-17 MED ORDER — SODIUM CHLORIDE 0.9 % IV SOLN
2.0000 g | Freq: Once | INTRAVENOUS | Status: AC
  Administered 2023-04-17: 2 g via INTRAVENOUS
  Filled 2023-04-17: qty 12.5

## 2023-04-17 MED ORDER — ACETAMINOPHEN 325 MG PO TABS: 650.0000 mg | ORAL_TABLET | Freq: Four times a day (QID) | ORAL | Status: DC | PRN

## 2023-04-17 MED ORDER — METRONIDAZOLE 500 MG/100ML IV SOLN
500.0000 mg | Freq: Two times a day (BID) | INTRAVENOUS | Status: DC
  Filled 2023-04-17: qty 100

## 2023-04-17 MED ORDER — MORPHINE SULFATE (PF) 4 MG/ML IV SOLN
4.0000 mg | INTRAVENOUS | Status: DC | PRN
  Administered 2023-04-17: 4 mg via INTRAVENOUS
  Filled 2023-04-17: qty 1

## 2023-04-17 MED ORDER — VANCOMYCIN HCL IN DEXTROSE 1-5 GM/200ML-% IV SOLN
1000.0000 mg | Freq: Once | INTRAVENOUS | Status: DC
  Filled 2023-04-17: qty 200

## 2023-04-17 MED ORDER — ACETAMINOPHEN 650 MG RE SUPP: 650.0000 mg | Freq: Four times a day (QID) | RECTAL | Status: DC | PRN

## 2023-04-17 MED ORDER — VANCOMYCIN HCL 1250 MG/250ML IV SOLN: 1250.0000 mg | INTRAVENOUS | Status: DC

## 2023-04-17 MED ORDER — MORPHINE SULFATE (CONCENTRATE) 10 MG/0.5ML PO SOLN
5.0000 mg | ORAL | Status: DC | PRN
  Administered 2023-04-17: 5 mg via SUBLINGUAL
  Filled 2023-04-17: qty 0.5

## 2023-04-17 MED ORDER — POLYVINYL ALCOHOL 1.4 % OP SOLN: 1.0000 [drp] | Freq: Four times a day (QID) | OPHTHALMIC | Status: DC | PRN

## 2023-04-17 MED ORDER — GLYCOPYRROLATE 1 MG PO TABS: 1.0000 mg | ORAL_TABLET | ORAL | Status: DC | PRN

## 2023-04-17 MED ORDER — LORAZEPAM 2 MG/ML IJ SOLN: 2.0000 mg | INTRAMUSCULAR | Status: DC | PRN

## 2023-04-17 NOTE — Progress Notes (Signed)
Pharmacy Antibiotic Note  Mikayla [name] is a 82 y.o. female admitted on 04/04/2023 with sepsis and evidence of infected sacral decubitus per ED provider note .  Pharmacy has been consulted for Cefepime and Vancomycin dosing.  Plan: Patient received Vancomycin 1000 mg IV in the ED. Will follow with Vancomycin 1250 mg IV Q 48 hrs, will begin approximately 24 hours after initial dose  Goal AUC 400-550. Expected AUC: 496.7 SCr used: 1.3 Expected Cmin: 10.4  Cefepime 2g IV q12h Will watch renal function, if declines further will need to decrease to q24h dosing.   Height: 5\' 3"  (160 cm) Weight: 64 kg (141 lb) IBW/kg (Calculated) : 52.4  Temp (24hrs), Avg:98.9 F (37.2 C), Min:98.9 F (37.2 C), Max:98.9 F (37.2 C)  Recent Labs  Lab 04/19/2023 1400 03/28/2023 1545  WBC 27.5*  --   CREATININE 1.30*  --   LATICACIDVEN  --  3.7*    Estimated Creatinine Clearance: 30 mL/min (A) (by C-G formula based on SCr of 1.3 mg/dL (H)).    Allergies  Allergen Reactions   Levaquin [Levofloxacin]    Other Nausea And Vomiting   Sulfa Antibiotics Hives   Oxycodone Anxiety and Other (See Comments)    Antimicrobials this admission: Cefepime 6/22 >>  Vancomycin 6/22 >>  Metronidazole 6/22 >>  Dose adjustments this admission:  Microbiology results:   Thank you for allowing pharmacy to be a part of this patient's care.  Clovia Cuff, PharmD, BCPS 04/21/2023 4:31 PM

## 2023-04-17 NOTE — Progress Notes (Signed)
PHARMACY -  BRIEF ANTIBIOTIC NOTE   Pharmacy has received consult(s) for Cefepime and Vancomycin from an ED provider.  The patient's profile has been reviewed for ht/wt/allergies/indication/available labs.    One time order(s) placed for Cefepime 2g and Vancomycin 1g by ED provider  Further antibiotics/pharmacy consults should be ordered by admitting physician if indicated.                       Thank you, Foye Deer 04/23/2023  3:08 PM

## 2023-04-17 NOTE — ED Notes (Signed)
Pt has atleast 7 pressure injuries documented PTA and also has foley catheter in place PTA draining brown urine. Pt wears 3L oxygen chronically. Sent for high WBC. 1 set cultures was sent by triage RN and IV was placed. Pt is confused. Contracted on L side from prior stroke.

## 2023-04-17 NOTE — Assessment & Plan Note (Addendum)
Noted progressive worsening functional decline over several months to years with multiple admissions for issues including sepsis Baseline multiple medical issues including hypertension, atrial fibrillation, Parkinson's disease, functional quadriplegia, COPD, stage III CKD, dementia, chronic Pseudomonas colonization Baseline DNR status Not tolerating p.o. intake at baseline Currently admitted with sepsis in the setting of multiple skin ulcers as well as overwhelming UTI with chronic indwelling Foley Case discussed at length with patient's daughters at the bedside Family currently agreeable to comfort measures with IV antibiotics Palliative care formally consulted to discuss overall goals of care Comfort care measures ordered Follow

## 2023-04-17 NOTE — ED Triage Notes (Signed)
Pt sent over from [facility] with elevated WBC. Pt has an indwelling foley catheter as well as multiple bedsores.

## 2023-04-17 NOTE — Consult Note (Signed)
Consultation Note Date: 04/02/2023   Patient Name: Mikayla [name]  DOB: 1941/01/17  MRN: [id number]  Age / Sex: 82 y.o., female  PCP: Patient, No Pcp Per Referring Physician: Floydene Flock, MD  Reason for Consultation: Establishing goals of care   HPI/Brief Hospital Course: 82 y.o. female  with past medical history with multiple medical issues including HTN, Atrial Fibrillation, Parkinson's disease with associated dementia, functional quadriplegia, COPD, chronic respiratory failure on 3L [place] at baseline, chronic pseudomonas colonization and stage III CKD admitted from [facility] on 04/07/2023 with significant leukocytosis.   Noted 3 IP admits within last 6 months  Most recent admit 03/26/2023 for severe hypokalemia  Palliative medicine was consulted for assisting with goals of care conversations, clarification on comfort measures/orders.  Subjective:  Extensive chart review has been completed prior to meeting patient including labs, vital signs, imaging, progress notes, orders, and available advanced directive documents from current and previous encounters.  Visited with Mikayla [name] at her bedside. Eyes closed but repeatedly moaning/screaming in pain. Screaming/moaning subsides without repeated stimulation. Met with family outside of room.  Introduced myself as a Publishing rights manager as a member of the palliative care team. Explained palliative medicine is specialized medical care for people living with serious illness. It focuses on providing relief from the symptoms and stress of a serious illness. The goal is to improve quality of life for both the patient and the family.   Met with both daughters-Mikayla [name] and Mikayla [name] along with their spouses. They are all familiar with Palliative Care as Mikayla [name] has been followed by PMT on an outpatient basis. Both daughters provide a brief life review.  Mikayla [name] has been a resident at [facility]  for 7 years, both daughters as well as their spouses are present at LTC bedside daily assisting with care and providing outside meals. Family shares Mikayla [name] has survived so much in the most recent years and they speak to her fighting spirit. Family shares the noted decline in function and overall health in the last several months. They share Mikayla [name] has had little to no PO intake in several days. They share their awareness of the multiple pressure ulcers and share the facility has provided the best care but the progression of the wound was rapid.  As discussed with ED team and primary team, focus was shifting towards comfort care but continuing IV fluids and IV antibiotics. We discussed comfort care in more detail, we discussed possibility of continued pain and suffering as lab monitoring would be needed to continue fluids and antibiotics. We also discussed the natural dying process, sensation of thirst and hunger are diminished. Daughters struggling with acceptance of knowing their mother is nearing the end of her life but are brought comfort that they are able to spend quality time with her preventing ongoing pain and suffering.  Decision was made to transition to full comfort measures. MOST form completed to reflect -DNR -Comfort measures -No antibiotics -No IV fluids -No Feeding Tube  Orders updates to reflect full comfort measures-discontinuing continuous IV fluids, IV antibiotics, labs and vital signs  Emotional support provided to patient/family/support persons. PMT will continue to follow and support patient as needed.  Objective: Primary Diagnoses: Present on Admission:  Sepsis (HCC)   Vital Signs: BP 135/85 (BP Location: Right Arm)   Pulse (!) 103   Temp 99 F (37.2 C)   Resp 18   Ht 5\' 3"  (1.6 m)   Wt 64 kg  SpO2 93%   BMI 24.98 kg/m         IO: Intake/output summary:  Intake/Output Summary (Last 24 hours) at 03/30/2023 1725 Last data filed at 04/16/2023  1602 Gross per 24 hour  Intake 100 ml  Output --  Net 100 ml    LBM:   Baseline Weight: Weight: 64 kg Most recent weight: Weight: 64 kg      Assessment and Plan  SUMMARY OF RECOMMENDATIONS   DNR Full Comfort Measures Morphine 2mg  IV q1h PRN for comfort-if frequent dosing required or if comfort unable to be obtained recommend continuous morphine infusion Lorazepam, Robinul, Haldol as needed to maintain comfort PMT to continue to follow for ongoing needs and support  Discussed With: Primary team and nursing staff   Thank you for this consult and allowing Palliative Medicine to participate in the care of Mikayla [name]. Palliative medicine will continue to follow and assist as needed.   Time Total: 75 minutes  Time spent includes: Detailed review of medical records (labs, imaging, vital signs), medically appropriate exam (mental status, respiratory, cardiac, skin), discussed with treatment team, counseling and educating patient, family and staff, documenting clinical information, medication management and coordination of care.   Signed by: Leeanne Deed, DNP, AGNP-C Palliative Medicine    Please contact Palliative Medicine Team phone at [phone] for questions and concerns.  For individual provider: See Loretha Stapler

## 2023-04-17 NOTE — ED Notes (Signed)
Advised nurse that patient has ready bed 

## 2023-04-17 NOTE — ED Notes (Signed)
Hospitalist at bedside 

## 2023-04-17 NOTE — Assessment & Plan Note (Signed)
Met sepsis criteria based on heart rate in 100s on presentation as well as white count of 28 Noted multiple source of infection including multiple skin ulcers over the body as well as urinary tract infection with chronic indwelling Foley Worsening functional decline over several months to years with baseline dementia, patient no longer tolerating p.o. intake. Both myself as well as ER physician discussed with patient's daughters at the bedside. They are agreeable to comfort measures as well as IV antibiotics in the short-term Will de-escalate sepsis protocol in the interim Follow up formal palliative care consult

## 2023-04-17 NOTE — H&P (Addendum)
History and Physical    Patient: Mikayla [name] DOB: 1941-06-06 DOA: 04/25/2023 DOS: the patient was seen and examined on 04/12/2023 PCP: Patient, No Pcp Per  Patient coming from: SNF  Chief Complaint:  Chief Complaint  Patient presents with   abnormal labs   HPI: Mikayla [name] is a 82 y.o. female with medical history significant of multiple medical issues including hypertension, atrial fibrillation, Parkinson's disease, functional quadriplegia, COPD, stage III CKD, dementia, chronic respiratory failure, chronic Pseudomonas colonization presenting with sepsis and comfort care.  Patient noted to be resident of local skilled nursing facility.  Nursing noted patient with worsening sacral decubitus wounds.  On antibiotics.  Symptoms have progressively worsened despite course.  No fevers or chills.  Family does report patient has not had overall function decline over several months to years.  No longer taking p.o. intake.  No reported chest pain shortness of breath.  No reported nausea or vomiting.  Baseline quadriplegia. Presented to the ER afebrile, heart rate into the low 100s per report, BP stable, required 2 L nasal cannula to keep O2 sats greater than 100%.  White count 27.5, hemoglobin 10.7.  Lactate 3.7.  Creatinine 1.3.  Chest x-ray stable. Review of Systems: unable to review all systems due to the inability of the patient to answer questions. Past Medical History:  Diagnosis Date   Anemia    Atrial fibrillation (HCC)    CHF (congestive heart failure) (HCC)    Empyema (HCC)    in [facility] Aug 2017- stayed in hospital on for 2 months.   GERD (gastroesophageal reflux disease)    Hypertension    Insomnia    Major depressive disorder    Osteoarthritis    Parkinson disease    Past Surgical History:  Procedure Laterality Date   EMBOLIZATION N/A 11/03/2018   Procedure: EMBOLIZATION;  Surgeon: Annice Needy, MD;  Location: [facility] INVASIVE CV LAB;  Service: Cardiovascular;   Laterality: N/A;   ERCP N/A 08/23/2018   Procedure: ENDOSCOPIC RETROGRADE CHOLANGIOPANCREATOGRAPHY (ERCP);  Surgeon: Midge Minium, MD;  Location: [facility] ENDOSCOPY;  Service: Endoscopy;  Laterality: N/A;   PEG TUBE PLACEMENT     Social History:  reports that she has quit smoking. Her smoking use included cigarettes. She has never used smokeless tobacco. She reports that she does not drink alcohol and does not use drugs.  Allergies  Allergen Reactions   Levaquin [Levofloxacin]    Other Nausea And Vomiting   Sulfa Antibiotics Hives   Oxycodone Anxiety and Other (See Comments)    Family History  Problem Relation Age of Onset   Hypertension Mother    COPD Mother    Lung cancer Father    Liver cancer Sister     Prior to Admission medications   Medication Sig Start Date End Date Taking? Authorizing Provider  allopurinol (ZYLOPRIM) 100 MG tablet Take 100 mg by mouth daily. 03/25/23   [provider]  amiodarone (PACERONE) 200 MG tablet Take 1 tablet (200 mg total) by mouth daily. 11/22/16   Shaune Pollack, MD  ammonium lactate (AMLACTIN) 12 % cream Apply 1 application topically as needed for dry skin. Patient not taking: Reported on 02/10/2023    [provider]  ascorbic acid (VITAMIN C) 500 MG tablet Take 1 tablet (500 mg total) by mouth 2 (two) times daily. 02/15/23   Sunnie Nielsen, DO  carbidopa-levodopa (SINEMET IR) 25-100 MG tablet Take 1 tablet by mouth 3 (three) times daily. 0900/1300/2100 02/04/23   [provider]  feeding supplement (ENSURE ENLIVE / ENSURE PLUS) LIQD Take 237 mLs by mouth 3 (three) times daily between meals. 02/15/23   Sunnie Nielsen, DO  ferrous sulfate 324 (65 Fe) MG TBEC Take 1 tablet by mouth daily. Only Monday and thursday    [provider]  furosemide (LASIX) 20 MG tablet Take to 1 tablet (20 mg total) by mouth ONCE OR TWICE daily (total daily dose 40 mg MAXIMUM) as needed for up to 3 days for increased leg swelling,  shortness of breath, weight gain 5+ lbs over 1-2 days. Seek medical care if these symptoms are not improving in 1-3 days. 03/26/23   Sunnie Nielsen, DO  gabapentin (NEURONTIN) 100 MG capsule Take 100 mg by mouth at bedtime.    [provider]  guaiFENesin (MUCINEX) 600 MG 12 hr tablet Take 600 mg by mouth 2 (two) times daily as needed for cough.    [provider]  Ipratropium-Albuterol (COMBIVENT RESPIMAT) 20-100 MCG/ACT AERS respimat Inhale 1 puff into the lungs 3 (three) times daily.    [provider]  ipratropium-albuterol (DUONEB) 0.5-2.5 (3) MG/3ML SOLN Take 3 mLs by nebulization every 6 (six) hours as needed.    [provider]  leptospermum manuka honey (MEDIHONEY) PSTE paste Apply 1 Application topically daily. 03/27/23   Sunnie Nielsen, DO  Multiple Vitamins-Minerals (CENTROVITE) TABS Take 1 tablet by mouth daily.    [provider]  Multiple Vitamins-Minerals (HEALTHY EYES SUPERVISION 2) CAPS Take 1 capsule by mouth in the morning and at bedtime. 0800/1800 11/25/22   [provider]  Nystatin (GERHARDT'S BUTT CREAM) CREA Apply 1 Application topically 2 (two) times daily. 03/26/23   Sunnie Nielsen, DO  NYSTATIN powder Apply 1 Application topically 2 (two) times daily. 03/02/23   [provider]  omeprazole (PRILOSEC OTC) 20 MG tablet Take 20 mg by mouth 2 (two) times daily.    [provider]  OXYGEN Inhale 2 L/min into the lungs continuous.    [provider]  PARoxetine (PAXIL) 30 MG tablet Take 30 mg by mouth daily.     [provider]  PATADAY 0.7 % SOLN Place 1 drop into both eyes daily. 12/28/22   [provider]  potassium chloride SA (KLOR-CON M) 20 MEQ tablet Take 20 mEq by mouth 2 (two) times daily. 03/25/23   [provider]  senna (SENOKOT) 8.6 MG TABS tablet Take 2 tablets by mouth at bedtime.     [provider]  zinc oxide 20 % ointment Apply 1 application  topically every 8 (eight) hours. To groin and thighs each shift Patient not taking: Reported on 02/10/2023    [provider]    Physical Exam: Vitals:   04/25/2023 1344 04/24/2023 1346  BP:  124/77  Pulse:  98  Resp:  18  Temp:  98.9 F (37.2 C)  TempSrc:  Axillary  SpO2:  100%  Weight: 64 kg   Height: 5\' 3"  (1.6 m)    Physical Exam Constitutional:      Comments: Mildly cachectic  HENT:     Head: Atraumatic.     Mouth/Throat:     Mouth: Mucous membranes are dry.  Eyes:     Pupils: Pupils are equal, round, and reactive to light.  Cardiovascular:     Rate and Rhythm: Normal rate and regular rhythm.  Pulmonary:     Effort: Pulmonary effort is normal.  Abdominal:     General: Abdomen is flat. Bowel sounds are normal.  Musculoskeletal:     Comments: Baseline quadriplegia  Skin:    Comments: Multiple skin ulcers noted on the back, sacral decubitus area, lower extremities  Neurological:     Comments: Does not answer questions, only states "help "     Data Reviewed:  There are no new results to review at this time. DG Chest Port 1 View CLINICAL DATA:  Sepsis  EXAM: PORTABLE CHEST 1 VIEW  COMPARISON:  03/25/2023  FINDINGS: The heart size and mediastinal contours are within normal limits. Aortic atherosclerosis. Chronically coarsened interstitial markings bilaterally. No new focal airspace consolidation. No pleural effusion or pneumothorax. Remote right-sided rib deformities.  IMPRESSION: Chronically coarsened interstitial markings bilaterally. No new focal airspace consolidation.  Electronically Signed   By: Duanne Guess D.O.   On: 04/19/2023 15:33  Lab Results  Component Value Date   WBC 27.5 (H) 04/21/2023   HGB 10.7 (L) 04/24/2023   HCT 34.8 (L) 04/08/2023   MCV 93.5 04/03/2023   PLT 163 04/10/2023    Assessment and Plan: * Comfort measures only status Noted progressive worsening functional decline over several months to years with  multiple admissions for issues including sepsis. Baseline DNR status Not tolerating p.o. intake at baseline Currently admitted with sepsis in the setting of multiple skin ulcers as well as overwhelming UTI with chronic indwelling Foley Case discussed at length with patient's daughters at the bedside Family currently agreeable to comfort measures with IV antibiotics Palliative care formally consulted to discuss overall goals of care Comfort care measures ordered Follow  Sepsis (HCC) Met sepsis criteria based on heart rate in 100s on presentation as well as white count of 28 Noted multiple source of infection including multiple skin ulcers over the body as well as urinary tract infection with chronic indwelling Foley Worsening functional decline over several months to years with baseline dementia, patient no longer tolerating p.o. intake. Both myself as well as ER physician discussed with patient's daughters at the bedside. They are agreeable to comfort measures as well as IV antibiotics in the short-term Will de-escalate sepsis protocol in the interim Follow up formal palliative care consult      Greater than 50% was spent in counseling and coordination of care with patient Total encounter time 80 minutes or more   Advance Care Planning:   Code Status: DNR   Consults: Palliative Care   Family Communication: Daughters Darl Pikes and Vann [place] at the bedside. Lengthy discussion as noted above.   Severity of Illness: The appropriate patient status for this patient is INPATIENT. Inpatient status is judged to be reasonable and necessary in order to provide the required intensity of service to ensure the patient's safety. The patient's presenting symptoms, physical exam findings, and initial radiographic and laboratory data in the context of their chronic comorbidities is felt to place them at high risk for further clinical deterioration. Furthermore, it is not anticipated that the patient will be  medically stable for discharge from the hospital within 2 midnights of admission.   * I certify that at the point of admission it is my clinical judgment that the patient will require inpatient hospital care spanning beyond 2 midnights from the point of admission due to high intensity of service, high risk for further deterioration and high frequency of surveillance required.*  Author: Floydene Flock, MD 04/14/2023 4:48 PM  For on call review [email or web address].

## 2023-04-17 NOTE — ED Triage Notes (Signed)
First Nurse Note:  Per EMS pt is from [facility] and is coming in for evaluation due to her WBC being elevated. Per EMS pt has many pressure ulcers. Pt is normally confused at times and yells out frequently.

## 2023-04-17 NOTE — ED Provider Notes (Addendum)
[facility] Provider Note    Event Date/Time   First MD Initiated Contact with Patient 05/14/23 1403     (approximate)   History   abnormal labs   HPI  Mikayla [name] is a 82 y.o. female with extensive past medical history currently living at [facility] on palliative care DNR worse chronic home O2 with worsening sacral decubitus wounds currently on antibiotics.  Was sent to the ER due to significant decline in her condition with significant leukocytosis.  No measured temperatures but has been mildly tachycardic in the 90s.  Was recently sick with C. difficile.  Not having any diarrhea currently.     Physical Exam   Triage Vital Signs: ED Triage Vitals  Enc Vitals Group     BP 05-14-2023 1346 124/77     Pulse Rate 05/14/23 1346 98     Resp May 14, 2023 1346 18     Temp 2023/05/14 1346 98.9 F (37.2 C)     Temp Source May 14, 2023 1346 Axillary     SpO2 05-14-2023 1346 100 %     Weight 14-May-2023 1344 141 lb (64 kg)     Height 2023-05-14 1344 5\' 3"  (1.6 m)     Head Circumference --      Peak Flow --      Pain Score --      Pain Loc --      Pain Edu? --      Excl. in GC? --     Most recent vital signs: Vitals:   2023/05/14 1346  BP: 124/77  Pulse: 98  Resp: 18  Temp: 98.9 F (37.2 C)  SpO2: 100%     Constitutional: Chronically ill-appearing.  Intermittently will yell out not providing much additional history. Eyes: Conjunctivae are normal.  Head: Atraumatic. Nose: No congestion/rhinnorhea. Mouth/Throat: Mucous membranes are dry Neck: Painless ROM.  Cardiovascular:   Good peripheral circulation. Respiratory: Normal respiratory effort.  No retractions.  Gastrointestinal: Soft and nontender.  Musculoskeletal: Foul-smelling purulent sacral decubitus ulcer. Skin:  Skin is warm, pressure wound to the right anterior shin    ED Results / Procedures / Treatments   Labs (all labs ordered are listed, but only abnormal results are  displayed) Labs Reviewed  CBC WITH DIFFERENTIAL/PLATELET - Abnormal; Notable for the following components:      Result Value   WBC 27.5 (*)    RBC 3.72 (*)    Hemoglobin 10.7 (*)    HCT 34.8 (*)    RDW 17.4 (*)    Neutro Abs 24.9 (*)    Abs Immature Granulocytes 0.31 (*)    All other components within normal limits  COMPREHENSIVE METABOLIC PANEL - Abnormal; Notable for the following components:   Sodium 131 (*)    CO2 21 (*)    Glucose, Bld 138 (*)    BUN 25 (*)    Creatinine, Ser 1.30 (*)    Calcium 8.1 (*)    Total Protein 5.3 (*)    Albumin 1.8 (*)    AST 44 (*)    GFR, Estimated 41 (*)    All other components within normal limits  RESP PANEL BY RT-PCR (RSV, FLU A&B, COVID)  RVPGX2  CULTURE, BLOOD (ROUTINE X 2)  CULTURE, BLOOD (ROUTINE X 2)  URINE CULTURE  URINALYSIS, ROUTINE W REFLEX MICROSCOPIC  LACTIC ACID, PLASMA  LACTIC ACID, PLASMA     EKG     RADIOLOGY    PROCEDURES:  Critical Care performed: No  Procedures   MEDICATIONS ORDERED IN ED: Medications  lactated ringers infusion (has no administration in time range)  ceFEPIme (MAXIPIME) 2 g in sodium chloride 0.9 % 100 mL IVPB (has no administration in time range)  metroNIDAZOLE (FLAGYL) IVPB 500 mg (has no administration in time range)  vancomycin (VANCOCIN) IVPB 1000 mg/200 mL premix (has no administration in time range)  morphine (PF) 4 MG/ML injection 4 mg (has no administration in time range)     IMPRESSION / MDM / ASSESSMENT AND PLAN / ED COURSE  I reviewed the triage vital signs and the nursing notes.                              Differential diagnosis includes, but is not limited to, Dehydration, sepsis, pna, uti, hypoglycemia, cva, drug effect, withdrawal, encephalitis  Patient presenting to the ER for evaluation of symptoms as described above.  Based on symptoms, risk factors and considered above differential, this presenting complaint could reflect a potentially life-threatening  illness therefore the patient will be placed on continuous pulse oximetry and telemetry for monitoring.  Laboratory evaluation will be sent to evaluate for the above complaints.  Patient is very ill-appearing do suspect large component of that is chronic.  Exam with very foul-smelling drainage from decubitus ulcer with greenish purulence.  Foley catheter also with cloudy purulent appearing urine.  She is not acutely hypoxic.   Clinical Course as of 03/31/2023 1516  Sat Apr 17, 2023  1508 Had extensive conversation with the patient's family at bedside.  Their primary focus is comfort care feel that her symptoms are not being well-managed at [facility].  She does have SIRS criteria with evidence of infection of the decubitus ulcer.  Discussed option for further diagnostic testing including imaging with CT family declining further testing at this time but amenable to IV antibiotics.  Will consult hospitalist for admission for IV antibiotics as well as palliative care consultation and symptomatic management for comfort care. [PR]    Clinical Course User Index [PR] Willy Eddy, MD     FINAL CLINICAL IMPRESSION(S) / ED DIAGNOSES   Final diagnoses:  Pressure injury of skin of sacral region, unspecified injury stage  SIRS (systemic inflammatory response syndrome) (HCC)     Rx / DC Orders   ED Discharge Orders     None        Note:  This document was prepared using Dragon voice recognition software and may include unintentional dictation errors.    Willy Eddy, MD 04/25/2023 1515    Willy Eddy, MD 04/08/2023 [phone]

## 2023-04-18 DIAGNOSIS — Z515 Encounter for palliative care: Secondary | ICD-10-CM

## 2023-04-18 DIAGNOSIS — R6510 Systemic inflammatory response syndrome (SIRS) of non-infectious origin without acute organ dysfunction: Secondary | ICD-10-CM | POA: Diagnosis not present

## 2023-04-18 DIAGNOSIS — L89159 Pressure ulcer of sacral region, unspecified stage: Secondary | ICD-10-CM

## 2023-04-18 DIAGNOSIS — Z7189 Other specified counseling: Secondary | ICD-10-CM | POA: Diagnosis not present

## 2023-04-18 LAB — CULTURE, BLOOD (ROUTINE X 2)

## 2023-04-18 NOTE — Progress Notes (Signed)
                                                                                                                                                                                                           Daily Progress Note   Patient Name: Mikayla [name]       Date: 04/18/2023 DOB: [birth year]  Age: 82 y.o. MRN#: [id number] Attending Physician: Leeroy Bock, MD Primary Care Physician: Patient, No Pcp Per Admit Date: 04/05/2023  Reason for Consultation/Follow-up: Establishing goals of care  HPI/Brief Hospital Review: 82 y.o. female  with past medical history with multiple medical issues including HTN, Atrial Fibrillation, Parkinson's disease with associated dementia, functional quadriplegia, COPD, chronic respiratory failure on 3L [place] at baseline, chronic pseudomonas colonization and stage III CKD admitted from [facility] on 04/22/2023 with significant leukocytosis.    Noted 3 IP admits within last 6 months   Most recent admit 03/26/2023 for severe hypokalemia  Transitioned to full comfort measures 6/22   Palliative medicine was consulted for assisting with goals of care conversations, clarification on comfort measures/orders.  Subjective: Extensive chart review has been completed prior to meeting patient including labs, vital signs, imaging, progress notes, orders, and available advanced directive documents from current and previous encounters.    Visited with Mikayla [name] at her bedside, resting comfortably, moans when engaged with light touch or calling of her name but drifts back off to sleep without re-engagement. Daughter at bedside reports restful night without issues.  Review of MAR, current medication regimen maintaining comfort-no changes needed at this time.  Per daughter, Ms. Weis been without adequate nutritional intake for about a month. Anticipate hospital passing but may be appropriate to discuss IPU as appropriate if remains stable within next 1-2  days.  Assessment/Recommendation/Plan  DNR/DNI Comfort measures-continue current medical regimen to maintain comfort Anticipate hospital death but may be appropriate to discuss IPU if remains stable in next 1-2 days PMT to continue to follow for ongoing needs and support  Thank you for allowing the Palliative Medicine Team to assist in the care of this patient.  Total time:  35 minutes  Time spent includes: Detailed review of medical records (labs, imaging, vital signs), medically appropriate exam (mental status, respiratory, cardiac, skin), discussed with treatment team, counseling and educating patient, family and staff, documenting clinical information, medication management and coordination of care.  Leeanne Deed, DNP, AGNP-C Palliative Medicine   Please contact Palliative Medicine Team phone at [phone] for questions and concerns.

## 2023-04-18 NOTE — Progress Notes (Signed)
PROGRESS NOTE  Mikayla [name]    DOB: 1941/01/13, 82 y.o.  HYQ:[id number]    Code Status: DNR   DOA: 04/03/2023   LOS: 1   Brief hospital course  Mikayla [name] is a 82 y.o. female  with past medical history with multiple medical issues including HTN, Atrial Fibrillation, Parkinson's disease with associated dementia, functional quadriplegia, COPD, chronic respiratory failure on 3L [place] at baseline, chronic pseudomonas colonization and stage III CKD admitted from [facility] on 03/29/2023 with significant leukocytosis.    Noted 3 IP admits within last 6 months Most recent admit 03/26/2023 for severe hypokalemia  Presented to the ER afebrile, heart rate into the low 100s per report, BP stable, required 2 L nasal cannula to keep O2 sats greater than 100%. White count 27.5, hemoglobin 10.7. Lactate 3.7. Creatinine 1.3. Chest x-ray stable.   Palliative medicine was consulted.  They were initially treated with cefepime, LR, flagyl, morphine.   Patient was admitted to medicine service for further workup and management of sepsis as outlined in detail below.  04/18/23 -stable  Assessment & Plan  Principal Problem:   Comfort measures only status Active Problems:   Sepsis (HCC)  Comfort Care Noted progressive worsening functional decline over several months to years with multiple admissions for issues including sepsis. Baseline DNR status Not tolerating p.o. intake at baseline Currently admitted with sepsis in the setting of multiple skin ulcers as well as overwhelming UTI with chronic indwelling Foley. Family currently agreeable to comfort measures with IV antibiotics - Palliative care following, appreciate your care - Comfort care measures ordered - continue antibiotics per family wishes   Sepsis (HCC) Met sepsis criteria based on heart rate in 100s on presentation as well as white count of 28 Noted multiple source of infection including multiple skin ulcers over the body as  well as urinary tract infection with chronic indwelling Foley. Treatment as listed above.   Body mass index is 24.98 kg/m.  VTE ppx: none  Diet:     Diet   Diet NPO time specified   Consultants: Palliative   Subjective 04/18/23    Pt reports nothing. Not responsive   Objective   Vitals:   03/28/2023 1344 04/25/2023 1346 03/28/2023 1703  BP:  124/77 135/85  Pulse:  98 (!) 103  Resp:  18 18  Temp:  98.9 F (37.2 C) 99 F (37.2 C)  TempSrc:  Axillary   SpO2:  100% 93%  Weight: 64 kg    Height: 5\' 3"  (1.6 m)     Intake/Output Summary (Last 24 hours) at 04/18/2023 0710 Last data filed at 04/05/2023 1602 Gross per 24 hour  Intake 100 ml  Output --  Net 100 ml   Filed Weights   04/22/2023 1344  Weight: 64 kg    Physical Exam:  General: eyes open, mouth open. No response.  HEENT: dry mucus membranes Respiratory: normal respiratory effort. Cardiovascular: quick capillary refill Nervous: unresponsive Extremities: no edema Skin: dry, intact, normal temperature, normal color. No rashes, lesions or ulcers on exposed skin  Labs   I have personally reviewed the following labs and imaging studies CBC    Component Value Date/Time   WBC 27.5 (H) 04/13/2023 1400   RBC 3.72 (L) 03/31/2023 1400   HGB 10.7 (L) 04/14/2023 1400   HCT 34.8 (L) 04/25/2023 1400   PLT 163 04/16/2023 1400   MCV 93.5 04/03/2023 1400   MCH 28.8 04/07/2023 1400   MCHC 30.7 04/01/2023 1400  RDW 17.4 (H) 05/09/2023 1400   LYMPHSABS 1.4 May 09, 2023 1400   MONOABS 0.8 05/09/2023 1400   EOSABS 0.0 05-09-2023 1400   BASOSABS 0.1 2023/05/09 1400      Latest Ref Rng & Units May 09, 2023    2:00 PM 03/26/2023    5:47 AM 03/26/2023    1:40 AM  BMP  Glucose 70 - 99 mg/dL 161  71    BUN 8 - 23 mg/dL 25  10    Creatinine 0.96 - 1.00 mg/dL 0.45  4.09    Sodium 811 - 145 mmol/L 131  136    Potassium 3.5 - 5.1 mmol/L 4.7  3.5  2.6   Chloride 98 - 111 mmol/L 101  93    CO2 22 - 32 mmol/L 21  34    Calcium 8.9 -  10.3 mg/dL 8.1  7.6     DG Chest Port 1 View  Result Date: 2023/05/09 CLINICAL DATA:  Sepsis EXAM: PORTABLE CHEST 1 VIEW COMPARISON:  03/25/2023 FINDINGS: The heart size and mediastinal contours are within normal limits. Aortic atherosclerosis. Chronically coarsened interstitial markings bilaterally. No new focal airspace consolidation. No pleural effusion or pneumothorax. Remote right-sided rib deformities. IMPRESSION: Chronically coarsened interstitial markings bilaterally. No new focal airspace consolidation. Electronically Signed   By: Duanne Guess D.O.   On: 2023/05/09 15:33    Disposition Plan & Communication  Patient status: Inpatient  Admitted From: SNF Planned disposition location: Hospice care Anticipated discharge date: TBD pending dispo decisions  Family Communication: with palliative     Author: Leeroy Bock, DO [facility] 04/18/2023, 7:10 AM   Available by Epic secure chat 7AM-7PM. If 7PM-7AM, please contact night-coverage.  TR[facility] contact information found on [email or web address].

## 2023-04-19 DIAGNOSIS — Z515 Encounter for palliative care: Secondary | ICD-10-CM | POA: Diagnosis not present

## 2023-04-19 DIAGNOSIS — Z7189 Other specified counseling: Secondary | ICD-10-CM | POA: Diagnosis not present

## 2023-04-19 DIAGNOSIS — L89159 Pressure ulcer of sacral region, unspecified stage: Secondary | ICD-10-CM | POA: Diagnosis not present

## 2023-04-19 LAB — CULTURE, BLOOD (ROUTINE X 2)
Culture: NO GROWTH
Culture: NO GROWTH
Special Requests: ADEQUATE

## 2023-04-19 NOTE — Progress Notes (Signed)
Nutrition Brief Note  Chart reviewed. Pt now transitioning to comfort care.  No further nutrition interventions planned at this time.  Please re-consult as needed.   Kalep Full W, RD, LDN, CDCES Registered Dietitian II Certified Diabetes Care and Education Specialist Please refer to [facility] for RD and/or RD on-call/weekend/after hours pager   

## 2023-04-19 NOTE — TOC Progression Note (Signed)
Transition of Care [facility]) - Progression Note    Patient Details  Name: Mikayla [name] MRN: [id number] Date of Birth: 1941-08-26  Transition of Care [facility]) CM/SW Contact  Allena Katz, LCSW Phone Number: 04/19/2023, 11:35 AM  Clinical Narrative:   Pt admitted from [facility] and is now on comfort care. Palliative anticipates hospital passing. TOC follow for care plan updates and needs.          Expected Discharge Plan and Services                                               Social Determinants of Health (SDOH) Interventions SDOH Screenings   Food Insecurity: No Food Insecurity (04/05/2023)  Housing: Patient Declined (04/09/2023)  Transportation Needs: No Transportation Needs (04/15/2023)  Utilities: Not At Risk (04/15/2023)  Tobacco Use: Medium Risk (04/09/2023)    Readmission Risk Interventions     No data to display

## 2023-04-19 NOTE — Progress Notes (Signed)
PROGRESS NOTE  Mikayla [name]    DOB: September 21, 1941, 82 y.o.  MWN:[id number]    Code Status: DNR   DOA: 04/13/2023   LOS: 2   Brief hospital course  Mikayla [name] is a 82 y.o. female  with past medical history with multiple medical issues including HTN, Atrial Fibrillation, Parkinson's disease with associated dementia, functional quadriplegia, COPD, chronic respiratory failure on 3L [place] at baseline, chronic pseudomonas colonization and stage III CKD admitted from [facility] on 04/24/2023 with significant leukocytosis.    Noted 3 IP admits within last 6 months Most recent admit 03/26/2023 for severe hypokalemia  Presented to the ER afebrile, heart rate into the low 100s per report, BP stable, required 2 L nasal cannula to keep O2 sats greater than 100%. White count 27.5, hemoglobin 10.7. Lactate 3.7. Creatinine 1.3. Chest x-ray stable.   Palliative medicine was consulted.  They were initially treated with cefepime, LR, flagyl, morphine.   Patient was admitted to medicine service for further workup and management of sepsis as outlined in detail below.  She continues to be unresponsive. She appears comfortable.  04/19/23 -stable  Assessment & Plan  Principal Problem:   Comfort measures only status Active Problems:   Sepsis (HCC)   SIRS (systemic inflammatory response syndrome) (HCC)  Comfort Care Noted progressive worsening functional decline over several months to years with multiple admissions for issues including sepsis. Baseline DNR status Not tolerating p.o. intake at baseline Currently admitted with sepsis in the setting of multiple skin ulcers as well as overwhelming UTI with chronic indwelling Foley. Family currently agreeable to comfort measures with IV antibiotics - Palliative care following, appreciate your care - Comfort care measures ordered - continue antibiotics per family wishes   Sepsis (HCC) Met sepsis criteria based on heart rate in 100s on  presentation as well as white count of 28 Noted multiple source of infection including multiple skin ulcers over the body as well as urinary tract infection with chronic indwelling Foley. Treatment as listed above.   Body mass index is 24.98 kg/m.  VTE ppx: none  Diet:     Diet   Diet NPO time specified   Consultants: Palliative   Subjective 04/19/23    Pt reports nothing. Not responsive   Objective   Vitals:   04/18/23 2009 04/19/23 0013 04/19/23 0408 04/19/23 0507  BP:      Pulse:      Resp: 20 16 16 20   Temp:      TempSrc:      SpO2:      Weight:      Height:       Intake/Output Summary (Last 24 hours) at 04/19/2023 0730 Last data filed at 04/19/2023 0513 Gross per 24 hour  Intake --  Output 50 ml  Net -50 ml    Filed Weights   04/10/2023 1344  Weight: 64 kg    Physical Exam:  General: eyes open, mouth open. No response.  HEENT: dry mucus membranes Respiratory: normal respiratory effort. Cardiovascular: quick capillary refill Nervous: unresponsive Extremities: no edema Skin: dry, intact, normal temperature, normal color. No rashes, lesions or ulcers on exposed skin  Labs   I have personally reviewed the following labs and imaging studies CBC    Component Value Date/Time   WBC 27.5 (H) 04/16/2023 1400   RBC 3.72 (L) 03/29/2023 1400   HGB 10.7 (L) 04/19/2023 1400   HCT 34.8 (L) 04/02/2023 1400   PLT 163 04/18/2023 1400   MCV  93.5 04-22-2023 1400   MCH 28.8 Apr 22, 2023 1400   MCHC 30.7 22-Apr-2023 1400   RDW 17.4 (H) 04/22/23 1400   LYMPHSABS 1.4 04-22-23 1400   MONOABS 0.8 04-22-23 1400   EOSABS 0.0 2023/04/22 1400   BASOSABS 0.1 April 22, 2023 1400      Latest Ref Rng & Units 04/22/2023    2:00 PM 03/26/2023    5:47 AM 03/26/2023    1:40 AM  BMP  Glucose 70 - 99 mg/dL 811  71    BUN 8 - 23 mg/dL 25  10    Creatinine 9.14 - 1.00 mg/dL 7.82  9.56    Sodium 213 - 145 mmol/L 131  136    Potassium 3.5 - 5.1 mmol/L 4.7  3.5  2.6   Chloride 98 -  111 mmol/L 101  93    CO2 22 - 32 mmol/L 21  34    Calcium 8.9 - 10.3 mg/dL 8.1  7.6     DG Chest Port 1 View  Result Date: Apr 22, 2023 CLINICAL DATA:  Sepsis EXAM: PORTABLE CHEST 1 VIEW COMPARISON:  03/25/2023 FINDINGS: The heart size and mediastinal contours are within normal limits. Aortic atherosclerosis. Chronically coarsened interstitial markings bilaterally. No new focal airspace consolidation. No pleural effusion or pneumothorax. Remote right-sided rib deformities. IMPRESSION: Chronically coarsened interstitial markings bilaterally. No new focal airspace consolidation. Electronically Signed   By: Duanne Guess D.O.   On: 04/22/2023 15:33    Disposition Plan & Communication  Patient status: Inpatient  Admitted From: SNF Planned disposition location: Hospice care Anticipated discharge date: TBD pending dispo decisions  Family Communication: with palliative     Author: Leeroy Bock, DO [facility] 04/19/2023, 7:30 AM   Available by Epic secure chat 7AM-7PM. If 7PM-7AM, please contact night-coverage.  TRH contact information found on [email or web address].

## 2023-04-19 NOTE — Progress Notes (Addendum)
Daily Progress Note   Patient Name: Mikayla [name]       Date: 04/19/2023 DOB: 04-Aug-1941  Age: 82 y.o. MRN#: [id number] Attending Physician: Leeroy Bock, MD Primary Care Physician: Patient, No Pcp Per Admit Date: 04/13/2023  Reason for Consultation/Follow-up: Establishing goals of care, symptom management  Subjective: Notes and labs reviewed.  MOST form in the chart for full comfort care.  Currently patient is on comfort care.  In to see patient.  Patient's daughter and son-in-law are at bedside.  They discussed patient's decline, and changes over the past few months.  They discussed they discussed understanding her status, and comfort focused care.  They discussed previous conversations with PMT, and patient's other daughter coming to acceptance of a plan for comfort care.  Patient awakened to voice x2, and both times attempted to say something that I could not understand. With asking her questions she was unable to provide response. Upon stopping attempts at communication, she closed her eyes and appeared to be sleeping.  Daughter states the family is a family of strong faith.  They understand she will be transitioning home and will get to see her husband.  They are hopeful that patient will remain here until hospital death.  Length of Stay: 2  Current Medications: Scheduled Meds:    Continuous Infusions:   PRN Meds: acetaminophen **OR** acetaminophen, antiseptic oral rinse, glycopyrrolate **OR** glycopyrrolate **OR** glycopyrrolate, haloperidol **OR** haloperidol **OR** haloperidol lactate, LORazepam **OR** LORazepam **OR** LORazepam, morphine injection, ondansetron **OR** ondansetron (ZOFRAN) IV, polyvinyl alcohol  Physical Exam Constitutional:      Comments: Opens  eyes to voice  Pulmonary:     Effort: Pulmonary effort is normal.  Skin:    Comments: Feet are cool to the touch             Vital Signs: BP (!) 109/39 (BP Location: Left Arm)   Pulse 100   Temp 97.8 F (36.6 C)   Resp 18   Ht 5\' 3"  (1.6 m)   Wt 64 kg   SpO2 (!) 87%   BMI 24.98 kg/m  SpO2: SpO2: (!) 87 % O2 Device: O2 Device: Nasal Cannula O2 Flow Rate: O2 Flow Rate (L/min): 2 L/min  Intake/output summary:  Intake/Output Summary (Last 24 hours) at 04/19/2023 1157 Last data filed at 04/19/2023 1052 Gross per  24 hour  Intake 120 ml  Output 50 ml  Net 70 ml   LBM: Last BM Date : May 15, 2023 Baseline Weight: Weight: 64 kg Most recent weight: Weight: 64 kg   Patient Active Problem List   Diagnosis Date Noted   SIRS (systemic inflammatory response syndrome) (HCC) 04/18/2023   Comfort measures only status 2023/05/15   Asymptomatic bacteriuria - enterococcus faecalis, pseudomonas aeruginosa 02/12/2023   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 difficile diarrhea 02/10/2023   Sacral decubitus ulcer 02/10/2023   COPD (chronic obstructive pulmonary disease) (HCC) 02/10/2023   Chronic respiratory failure with hypoxia (HCC) 02/10/2023   Hypokalemia 02/10/2023   Stage 3a chronic kidney disease (HCC) 02/10/2023   Leukocytosis 02/10/2023   Candidal intertrigo 02/10/2023   C. difficile colitis 02/10/2023   Parkinson's disease 01/17/2021   Occasional tremors 01/17/2021   General body deterioration 12/02/2018   Shortness of breath 12/02/2018   HTN (hypertension) 11/02/2018   PAF (paroxysmal atrial fibrillation) (HCC) 11/02/2018   Renal hemorrhage, right 11/02/2018   Acute cholangitis    Calculus of bile duct with acute cholangitis with obstruction    Severe sepsis (HCC) 08/19/2018   Acute respiratory failure (HCC) 08/10/2018   Calculus of bile duct without cholangitis or cholecystitis with obstruction    Cellulitis  11/20/2016   Sepsis (HCC) 10/23/2016   Pressure injury of skin 09/30/2016   Palliative care encounter    Goals of care, counseling/discussion    Encounter for hospice care discussion    Contracture of muscle of hand 07/06/2016    Palliative Care Assessment & Plan    Recommendations/Plan: Patient on comfort care.  No changes recommended to medications as patient appears comfortable. Family desires for patient to stay here for hospital death as moving her causes pain and discomfort.   Code Status:    Code Status Orders  (From admission, onward)           Start     Ordered   05/15/23 1853  Do not attempt resuscitation (DNR)  Continuous       Question Answer Comment  If patient has no pulse and is not breathing Do Not Attempt Resuscitation   If patient has a pulse and/or is breathing: Medical Treatment Goals COMFORT MEASURES: Keep clean/warm/dry, use medication by any route; positioning, wound care and other measures to relieve pain/suffering; use oxygen, suction/manual treatment of airway obstruction for comfort; do not transfer unless for comfort needs.   Consent: Discussion documented in EHR or advanced directives reviewed      05-15-23 1854           Code Status History     Date Active Date Inactive Code Status Order ID Comments User Context   05-15-23 1616 05-15-2023 1854 DNR [id number]  Floydene Flock, MD ED   15-May-2023 1518 05-15-2023 1616 DNR [id number]  Willy Eddy, MD ED   03/25/2023 2323 03/26/2023 1828 DNR [id number]  Nolberto Hanlon, MD ED   02/10/2023 0223 02/15/2023 1634 DNR [id number]  Andris Baumann, MD ED   02/10/2023 0120 02/10/2023 0223 Full Code [id number]  Andris Baumann, MD ED   11/02/2018 2351 11/08/2018 1923 DNR [id number]  Oralia Manis, MD Inpatient   08/19/2018 1806 08/25/2018 1545 DNR [id number]  Houston Siren, MD ED   08/10/2018 0111 08/12/2018 2135 DNR [id number]  Cammy Copa, MD Inpatient   11/20/2016 0904 11/21/2016 1854 DNR [id number]  Arnaldo Natal Inpatient   10/24/2016 0307 10/30/2016 2126  DNR [id number]  Shaune Pollack, MD Inpatient   09/28/2016 1555 10/02/2016 1523 DNR [id number]  Altamese Dilling, MD Inpatient      Advance Directive Documentation    Flowsheet Row Most Recent Value  Type of Advance Directive Healthcare Power of Attorney  Pre-existing out of facility DNR order (yellow form or pink MOST form) --  "MOST" Form in Place? --       Prognosis:  Hours - Days   Care plan was discussed with nurse  Thank you for allowing the Palliative Medicine Team to assist in the care of this patient.   Morton Stall, NP  Please contact Palliative Medicine Team phone at [phone] for questions and concerns.

## 2023-04-20 DIAGNOSIS — Z515 Encounter for palliative care: Secondary | ICD-10-CM | POA: Diagnosis not present

## 2023-04-21 LAB — CULTURE, BLOOD (ROUTINE X 2)

## 2023-04-22 LAB — CULTURE, BLOOD (ROUTINE X 2)

## 2023-04-26 DIAGNOSIS — 419620001 Death: Secondary | SNOMED CT | POA: Diagnosis not present

## 2023-04-26 NOTE — Progress Notes (Signed)
Called [employer] spoke to  [employer] # [phone]

## 2023-04-26 NOTE — Progress Notes (Addendum)
Daily Progress Note   Patient Name: Mikayla [name]       Date: 2023-04-22 DOB: [birth year]  Age: 82 y.o. MRN#: [id number] Attending Physician: Leeroy Bock, MD Primary Care Physician: Patient, No Pcp Per Admit Date: 04/19/2023  Reason for Consultation/Follow-up: Establishing goals of care  Subjective: Notes reviewed.  In to see patient.  Daughter and son-in-law are at bedside.  They advise that patient has just died not long before my arrival.  Daughter discusses that her sister (patient's other daughter) was able to spend time with her last night.  Daughter discusses that her mother died very peacefully.  She reflects back on her mother over the years and the person that she has been.  She states that her mother will be going back to [facility] for burial.  Questions answered.  Nursing in to discuss funeral arrangements and details.   Length of Stay: 3  Current Medications: Scheduled Meds:    Continuous Infusions:   PRN Meds: acetaminophen **OR** acetaminophen, antiseptic oral rinse, glycopyrrolate **OR** glycopyrrolate **OR** glycopyrrolate, haloperidol **OR** haloperidol **OR** haloperidol lactate, LORazepam **OR** LORazepam **OR** LORazepam, morphine injection, ondansetron **OR** ondansetron (ZOFRAN) IV, polyvinyl alcohol  Physical Exam Constitutional:      Comments: Died             Vital Signs: BP 117/81 (BP Location: Right Arm)   Pulse (!) 52   Temp 97.8 F (36.6 C) (Axillary)   Resp 14   Ht 5\' 3"  (1.6 m)   Wt 64 kg   SpO2 90%   BMI 24.98 kg/m  SpO2: SpO2: 90 % O2 Device: O2 Device: Nasal Cannula O2 Flow Rate: O2 Flow Rate (L/min): 2 L/min  Intake/output summary: No intake or output data in the 24 hours ending 22-Apr-2023 1148 LBM: Last BM Date :  04/25/2023 Baseline Weight: Weight: 64 kg Most recent weight: Weight: 64 kg   Patient Active Problem List   Diagnosis Date Noted   SIRS (systemic inflammatory response syndrome) (HCC) 04/18/2023   Comfort measures only status 03/28/2023   Asymptomatic bacteriuria - enterococcus faecalis, pseudomonas aeruginosa 02/12/2023   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 difficile diarrhea 02/10/2023   Sacral decubitus ulcer 02/10/2023   COPD (chronic obstructive pulmonary disease) (HCC) 02/10/2023   Chronic respiratory failure with hypoxia (  HCC) 02/10/2023   Hypokalemia 02/10/2023   Stage 3a chronic kidney disease (HCC) 02/10/2023   Leukocytosis 02/10/2023   Candidal intertrigo 02/10/2023   C. difficile colitis 02/10/2023   Parkinson's disease 01/17/2021   Occasional tremors 01/17/2021   General body deterioration 12/02/2018   Shortness of breath 12/02/2018   HTN (hypertension) 11/02/2018   PAF (paroxysmal atrial fibrillation) (HCC) 11/02/2018   Renal hemorrhage, right 11/02/2018   Acute cholangitis    Calculus of bile duct with acute cholangitis with obstruction    Severe sepsis (HCC) 08/19/2018   Acute respiratory failure (HCC) 08/10/2018   Calculus of bile duct without cholangitis or cholecystitis with obstruction    Cellulitis 11/20/2016   Sepsis (HCC) 10/23/2016   Pressure injury of skin 09/30/2016   Palliative care encounter    Goals of care, counseling/discussion    Encounter for hospice care discussion    Contracture of muscle of hand 07/06/2016    Palliative Care Assessment & Plan    Recommendations/Plan: Patient died.  Code Status:    Code Status Orders  (From admission, onward)           Start     Ordered   03/27/2023 1853  Do not attempt resuscitation (DNR)  Continuous       Question Answer Comment  If patient has no pulse and is not breathing Do Not Attempt Resuscitation   If patient has a pulse  and/or is breathing: Medical Treatment Goals COMFORT MEASURES: Keep clean/warm/dry, use medication by any route; positioning, wound care and other measures to relieve pain/suffering; use oxygen, suction/manual treatment of airway obstruction for comfort; do not transfer unless for comfort needs.   Consent: Discussion documented in EHR or advanced directives reviewed      04/15/2023 1854           Code Status History     Date Active Date Inactive Code Status Order ID Comments User Context   04/08/2023 1616 04/16/2023 1854 DNR [id number]  Floydene Flock, MD ED   04/10/2023 1518 03/31/2023 1616 DNR [id number]  Willy Eddy, MD ED   03/25/2023 2323 03/26/2023 1828 DNR [id number]  Nolberto Hanlon, MD ED   02/10/2023 0223 02/15/2023 1634 DNR [id number]  Andris Baumann, MD ED   02/10/2023 0120 02/10/2023 0223 Full Code [id number]  Andris Baumann, MD ED   11/02/2018 2351 11/08/2018 1923 DNR [id number]  Oralia Manis, MD Inpatient   08/19/2018 1806 08/25/2018 1545 DNR [id number]  Houston Siren, MD ED   08/10/2018 0111 08/12/2018 2135 DNR [id number]  Cammy Copa, MD Inpatient   11/20/2016 0904 11/21/2016 1854 DNR [id number]  Arnaldo Natal Inpatient   10/24/2016 0307 10/30/2016 2126 DNR [id number]  Shaune Pollack, MD Inpatient   09/28/2016 1555 10/02/2016 1523 DNR [id number]  Altamese Dilling, MD Inpatient      Advance Directive Documentation    Flowsheet Row Most Recent Value  Type of Advance Directive Healthcare Power of Attorney  Pre-existing out of facility DNR order (yellow form or pink MOST form) --  "MOST" Form in Place? --    Thank you for allowing the Palliative Medicine Team to assist in the care of this patient.   Morton Stall, NP  Please contact Palliative Medicine Team phone at [phone] for questions and concerns.

## 2023-04-26 NOTE — Death Summary Note (Signed)
DEATH SUMMARY   Patient Details  Name: Mikayla [name] MRN: [id number] DOB: June 07, 1941 WUJ:[id number], No Pcp Per Admission/Discharge Information   Admit Date:  05-12-23  Date of Death: Date of Death: 05/15/23  Time of Death: Time of Death: 1104/05/29  Length of Stay: 3   Principle Cause of death: advanced dementia, respiratory failure  Hospital Diagnoses: Principal Problem:   Comfort measures only status Active Problems:   Sepsis (HCC)   SIRS (systemic inflammatory response syndrome) [facility])  Hospital Course: Mikayla [name] is a 82 y.o. female  with past medical history with multiple medical issues including HTN, Atrial Fibrillation, Parkinson's disease with associated dementia, functional quadriplegia, COPD, chronic respiratory failure on 3L [place] at baseline, chronic pseudomonas colonization and stage III CKD admitted from [facility] on 05-12-23 with significant leukocytosis.    Noted 3 IP admits within last 6 months Most recent admit 03/26/2023 for severe hypokalemia   Presented to the ER afebrile, heart rate into the low 100s per report, BP stable, required 2 L nasal cannula to keep O2 sats greater than 100%. White count 27.5, hemoglobin 10.7. Lactate 3.7. Creatinine 1.3. Chest x-ray stable.   They were initially treated with cefepime, LR, flagyl, morphine.   Palliative medicine was consulted for GOC discussions.   She has a DNR code status and comfort care measures instituted per family decision on admission. She was kept comfortable throughout her admission and passed away of natural causes with family member at bedside.      Procedures: none   Consultations: palliative  The results of significant diagnostics from this hospitalization (including imaging, microbiology, ancillary and laboratory) are listed below for reference.   Significant Diagnostic Studies: DG Chest Port 1 View  Result Date: May 12, 2023 CLINICAL DATA:  Sepsis EXAM: PORTABLE CHEST 1 VIEW  COMPARISON:  03/25/2023 FINDINGS: The heart size and mediastinal contours are within normal limits. Aortic atherosclerosis. Chronically coarsened interstitial markings bilaterally. No new focal airspace consolidation. No pleural effusion or pneumothorax. Remote right-sided rib deformities. IMPRESSION: Chronically coarsened interstitial markings bilaterally. No new focal airspace consolidation. Electronically Signed   By: Duanne Guess D.O.   On: 05-12-2023 15:33   DG Chest Port 1 View  Result Date: 03/25/2023 CLINICAL DATA:  Pneumonia EXAM: PORTABLE CHEST 1 VIEW COMPARISON:  02/10/2023 FINDINGS: Right fifth rib thoracotomy defect and right-sided volume loss suggesting partial right lung resection are unchanged. No superimposed focal pulmonary infiltrate. No pneumothorax or pleural effusion. Cardiac size is within normal limits. Unchanged mediastinal shift to the right. Pulmonary vascularity is normal. Degenerative changes are seen within the left shoulder. No acute bone abnormality. Embolization coils incidentally noted within the upper abdomen. IMPRESSION: 1. No radiographic evidence of acute cardiopulmonary disease. Electronically Signed   By: Helyn Numbers M.D.   On: 03/25/2023 23:55    Microbiology: Recent Results (from the past 240 hour(s))  Blood Culture (routine x 2)     Status: None (Preliminary result)   Collection Time: 05-12-2023  2:00 PM   Specimen: BLOOD  Result Value Ref Range Status   Specimen Description BLOOD BLOOD RIGHT FOREARM  Final   Special Requests   Final    BOTTLES DRAWN AEROBIC AND ANAEROBIC Blood Culture adequate volume   Culture   Final    NO GROWTH 3 DAYS Performed at [facility], [address] [place], [place] [address]    Report Status PENDING  Incomplete  Blood Culture (routine x 2)     Status: None (Preliminary result)  Collection Time: 04/23/2023  3:03 PM   Specimen: BLOOD  Result Value Ref Range Status   Specimen Description BLOOD RIGHT  ANTECUBITAL  Final   Special Requests   Final    BOTTLES DRAWN AEROBIC AND ANAEROBIC Blood Culture results may not be optimal due to an inadequate volume of blood received in culture bottles   Culture   Final    NO GROWTH 3 DAYS Performed at [facility], [address] [place], [place] [address]    Report Status PENDING  Incomplete  Resp panel by RT-PCR (RSV, Flu A&B, Covid) Anterior Nasal Swab     Status: None   Collection Time: 20-Apr-2023  4:45 PM   Specimen: Anterior Nasal Swab  Result Value Ref Range Status   SARS Coronavirus 2 by RT PCR NEGATIVE NEGATIVE Final    Comment: (NOTE) SARS-CoV-2 target nucleic acids are NOT DETECTED.  The SARS-CoV-2 RNA is generally detectable in upper respiratory specimens during the acute phase of infection. The lowest concentration of SARS-CoV-2 viral copies this assay can detect is 138 copies/mL. A negative result does not preclude SARS-Cov-2 infection and should not be used as the sole basis for treatment or other patient management decisions. A negative result may occur with  improper specimen collection/handling, submission of specimen other than nasopharyngeal swab, presence of viral mutation(s) within the areas targeted by this assay, and inadequate number of viral copies(<138 copies/mL). A negative result must be combined with clinical observations, patient history, and epidemiological information. The expected result is Negative.  Fact Sheet for Patients:  BloggerCourse.com  Fact Sheet for Healthcare Providers:  SeriousBroker.it  This test is no t yet approved or cleared by the [place] FDA and  has been authorized for detection and/or diagnosis of SARS-CoV-2 by FDA under an Emergency Use Authorization (EUA). This EUA will remain  in effect (meaning this test can be used) for the duration of the COVID-19 declaration under Section 564(b)(1) of the Act, 21 U.S.C.section  360bbb-3(b)(1), unless the authorization is terminated  or revoked sooner.       Influenza A by PCR NEGATIVE NEGATIVE Final   Influenza B by PCR NEGATIVE NEGATIVE Final    Comment: (NOTE) The Xpert Xpress SARS-CoV-2/FLU/RSV plus assay is intended as an aid in the diagnosis of influenza from Nasopharyngeal swab specimens and should not be used as a sole basis for treatment. Nasal washings and aspirates are unacceptable for Xpert Xpress SARS-CoV-2/FLU/RSV testing.  Fact Sheet for Patients: BloggerCourse.com  Fact Sheet for Healthcare Providers: SeriousBroker.it  This test is not yet approved or cleared by the [place] FDA and has been authorized for detection and/or diagnosis of SARS-CoV-2 by FDA under an Emergency Use Authorization (EUA). This EUA will remain in effect (meaning this test can be used) for the duration of the COVID-19 declaration under Section 564(b)(1) of the Act, 21 U.S.C. section 360bbb-3(b)(1), unless the authorization is terminated or revoked.     Resp Syncytial Virus by PCR NEGATIVE NEGATIVE Final    Comment: (NOTE) Fact Sheet for Patients: BloggerCourse.com  Fact Sheet for Healthcare Providers: SeriousBroker.it  This test is not yet approved or cleared by the [place] FDA and has been authorized for detection and/or diagnosis of SARS-CoV-2 by FDA under an Emergency Use Authorization (EUA). This EUA will remain in effect (meaning this test can be used) for the duration of the COVID-19 declaration under Section 564(b)(1) of the Act, 21 U.S.C. section 360bbb-3(b)(1), unless the authorization is terminated or revoked.  Performed at  [facility], [address], [place], [place] [address]    Time spent: >35 minutes  Signed: Leeroy Bock, MD 05-07-2023

## 2023-04-26 NOTE — Progress Notes (Signed)
PROGRESS NOTE  Mikayla [name]    DOB: February 02, 1941, 82 y.o.  ZOX:[id number]    Code Status: DNR   DOA: 05-14-23   LOS: 3   Brief hospital course  Mikayla [name] is a 82 y.o. female  with past medical history with multiple medical issues including HTN, Atrial Fibrillation, Parkinson's disease with associated dementia, functional quadriplegia, COPD, chronic respiratory failure on 3L [place] at baseline, chronic pseudomonas colonization and stage III CKD admitted from [facility] on May 14, 2023 with significant leukocytosis.    Noted 3 IP admits within last 6 months Most recent admit 03/26/2023 for severe hypokalemia  Presented to the ER afebrile, heart rate into the low 100s per report, BP stable, required 2 L nasal cannula to keep O2 sats greater than 100%. White count 27.5, hemoglobin 10.7. Lactate 3.7. Creatinine 1.3. Chest x-ray stable.   Palliative medicine was consulted.  They were initially treated with cefepime, LR, flagyl, morphine.   Patient was admitted to medicine service for further workup and management of sepsis as outlined in detail below.  She has a DNR code status and comfort care measures instituted per family decision on admission.   04/23/2023 -She continues to be unresponsive. She appears comfortable. Based on progression of her breathing pattern today, I would expect hospital passing within 24-48 hours.   Assessment & Plan  Principal Problem:   Comfort measures only status Active Problems:   Sepsis (HCC)   SIRS (systemic inflammatory response syndrome) (HCC)  Comfort Care Noted progressive worsening functional decline over several months to years with multiple admissions for issues including sepsis. Baseline DNR status Not tolerating p.o. intake at baseline Currently admitted with sepsis in the setting of multiple skin ulcers as well as overwhelming UTI with chronic indwelling Foley. Family currently agreeable to comfort measures with IV antibiotics -  Palliative care following, appreciate your care - Comfort care measures ordered - morphine for air hunger which appears to have worsened today   Sepsis (HCC) Met sepsis criteria based on heart rate in 100s on presentation as well as white count of 28 Noted multiple source of infection including multiple skin ulcers over the body as well as urinary tract infection with chronic indwelling Foley. - antibiotics are discontinued in setting of comfort care.   Body mass index is 24.98 kg/m.  VTE ppx: none  Diet:     Diet   Diet NPO time specified   Consultants: Palliative   Subjective 04/13/2023    Pt reports nothing. Not responsive   Objective   Vitals:   04/19/23 1800 04/19/23 2011 04/01/2023 0000 04/07/2023 0406  BP:      Pulse:      Resp: 20 20 16 16   Temp:      TempSrc:      SpO2:      Weight:      Height:      No intake or output data in the 24 hours ending 04/10/2023 0740  Filed Weights   05/14/2023 1344  Weight: 64 kg    Physical Exam:  General: eyes open, mouth open. No response.  HEENT: dry mucus membranes Respiratory: agonal breathing this morning Cardiovascular: delayed capillary refill Nervous: unresponsive Extremities: no edema Skin: dry, intact, normal temperature, normal color. No rashes, lesions or ulcers on exposed skin  Labs   I have personally reviewed the following labs and imaging studies CBC    Component Value Date/Time   WBC 27.5 (H) 14-May-2023 1400   RBC 3.72 (L) May 14, 2023  1400   HGB 10.7 (L) 04/16/2023 1400   HCT 34.8 (L) 04/19/2023 1400   PLT 163 04/10/2023 1400   MCV 93.5 03/31/2023 1400   MCH 28.8 03/27/2023 1400   MCHC 30.7 04/22/2023 1400   RDW 17.4 (H) 04/13/2023 1400   LYMPHSABS 1.4 04/08/2023 1400   MONOABS 0.8 03/31/2023 1400   EOSABS 0.0 04/25/2023 1400   BASOSABS 0.1 04/04/2023 1400      Latest Ref Rng & Units 04/08/2023    2:00 PM 03/26/2023    5:47 AM 03/26/2023    1:40 AM  BMP  Glucose 70 - 99 mg/dL 161  71    BUN 8 -  23 mg/dL 25  10    Creatinine 0.96 - 1.00 mg/dL 0.45  4.09    Sodium 811 - 145 mmol/L 131  136    Potassium 3.5 - 5.1 mmol/L 4.7  3.5  2.6   Chloride 98 - 111 mmol/L 101  93    CO2 22 - 32 mmol/L 21  34    Calcium 8.9 - 10.3 mg/dL 8.1  7.6     No results found.  Disposition Plan & Communication  Patient status: Inpatient  Admitted From: SNF Planned disposition location: Hospital death expected within 24-48 hours  Family Communication: with palliative     Author: Leeroy Bock, DO [facility] 04/22/2023, 7:40 AM   Available by Epic secure chat 7AM-7PM. If 7PM-7AM, please contact night-coverage.  [facility] contact information found on [email or web address].

## 2023-04-26 NOTE — Care Management Important Message (Signed)
Important Message  Patient Details  Name: Mikayla [name] MRN: [id number] Date of Birth: 08-03-1941   Medicare Important Message Given:  Other (see comment)  Patient is full comfort care and out of respect for the patient and family no Important Message from [facility] given.   Olegario Messier A [name] April 25, 2023, 9:09 AM

## 2023-04-26 DEATH — deceased
